# Patient Record
Sex: Male | Born: 1940
Health system: Southern US, Community
[De-identification: ages and names within clinical notes are randomized; demographics above are authoritative.]

## PROBLEM LIST (undated history)

## (undated) DIAGNOSIS — I10 Essential (primary) hypertension: Secondary | ICD-10-CM

## (undated) DIAGNOSIS — C189 Malignant neoplasm of colon, unspecified: Principal | ICD-10-CM

## (undated) DIAGNOSIS — Z923 Personal history of irradiation: Secondary | ICD-10-CM

## (undated) DIAGNOSIS — C3410 Malignant neoplasm of upper lobe, unspecified bronchus or lung: Secondary | ICD-10-CM

## (undated) DIAGNOSIS — Z9221 Personal history of antineoplastic chemotherapy: Secondary | ICD-10-CM

## (undated) HISTORY — DX: Personal history of irradiation: Z92.3

## (undated) HISTORY — PX: OTHER SURGICAL HISTORY: SHX169

## (undated) HISTORY — DX: Malignant neoplasm of upper lobe, unspecified bronchus or lung: C34.10

## (undated) HISTORY — DX: Malignant neoplasm of colon, unspecified: C18.9

## (undated) HISTORY — PX: LUNG SURGERY: SHX703

---

## 2008-08-14 HISTORY — PX: COLON SURGERY: SHX602

## 2008-12-02 ENCOUNTER — Inpatient Hospital Stay (HOSPITAL_COMMUNITY): Admission: EM | Admit: 2008-12-02 | Discharge: 2008-12-14 | Payer: Self-pay | Admitting: Emergency Medicine

## 2008-12-02 IMAGING — CT CT ABDOMEN W/ CM
2 of 5 series · 16 of 46 positions shown, 18 images · IV contrast (agent unspecified)
Comparison: Plain films earlier today

CT ABDOMEN

CLINICAL DATA: Vomiting, dehydration, abdominal pain and swelling

CT ABDOMEN AND PELVIS WITH CONTRAST
TECHNIQUE: Multidetector CT imaging of the abdomen and pelvis was
performed using the standard protocol following bolus
administration of intravenous contrast.
Contrast: No oral contrast.  [BH], 80 mL

[Series 2: abd/pelv with 5.0 b31f st · axial · 0.80mm/px · z∈[-599,-134]mm · 13 of 105 slices shown, 15 images]
[im 6/105  soft-tissue]
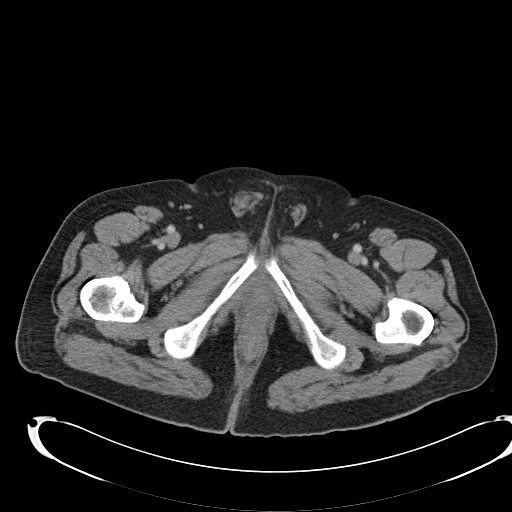
[im 6/105  bone]
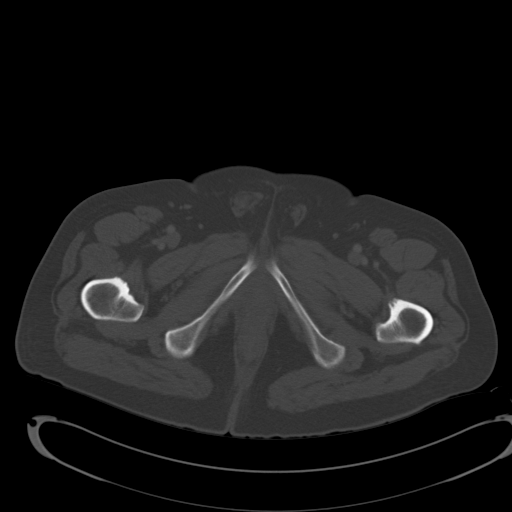
[im 17/105  soft-tissue]
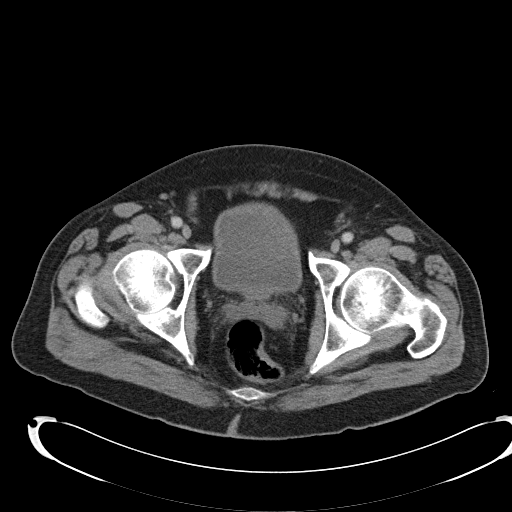
[im 22/105  soft-tissue]
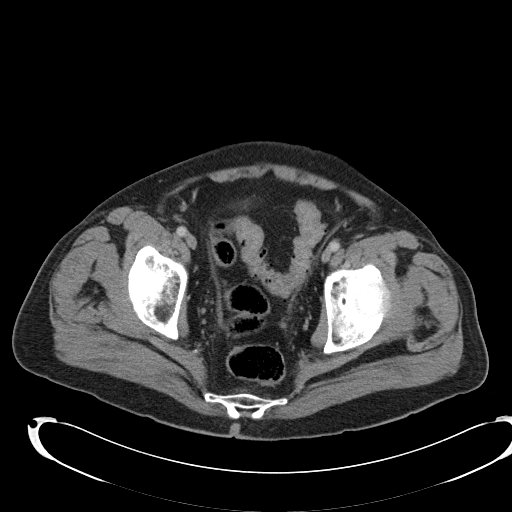
[im 28/105  soft-tissue]
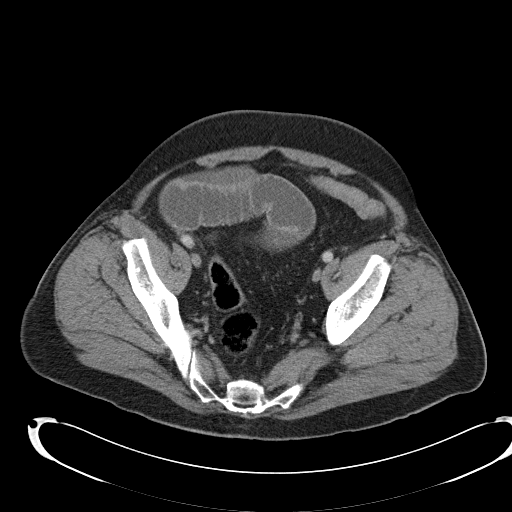
[im 39/105  soft-tissue]
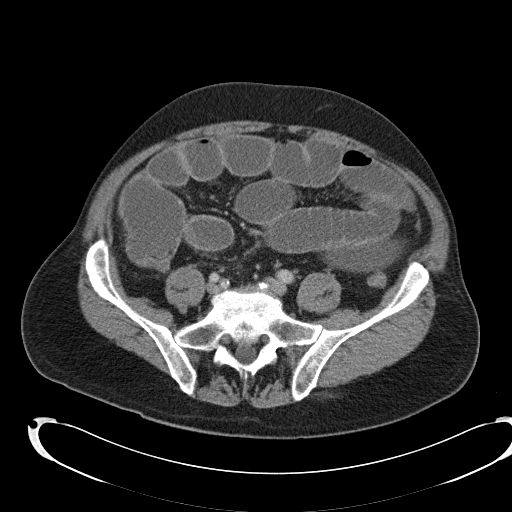
[im 44/105  soft-tissue]
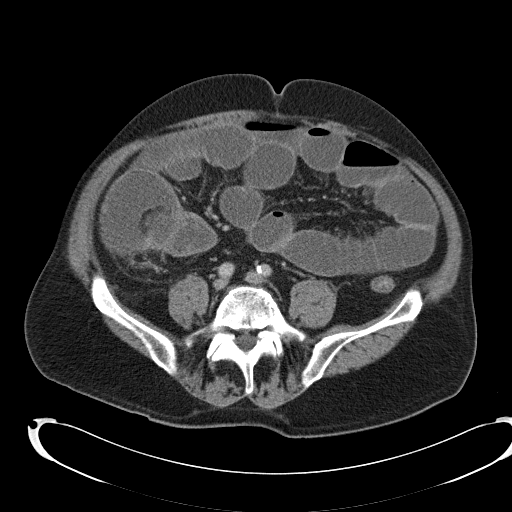
[im 55/105  soft-tissue]
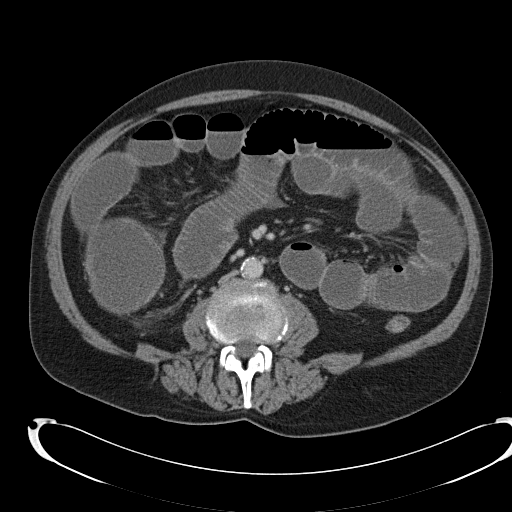
[im 61/105  soft-tissue]
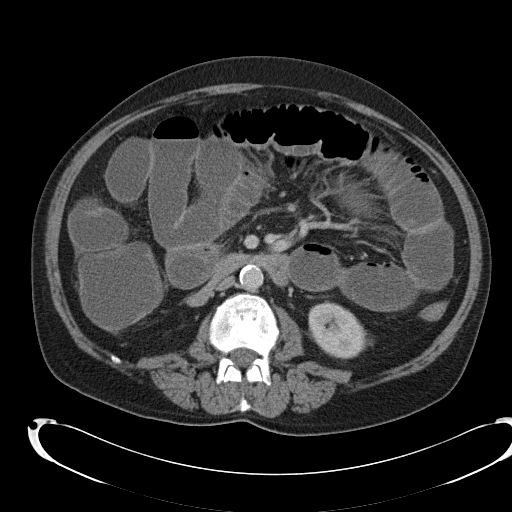
[im 66/105  soft-tissue]
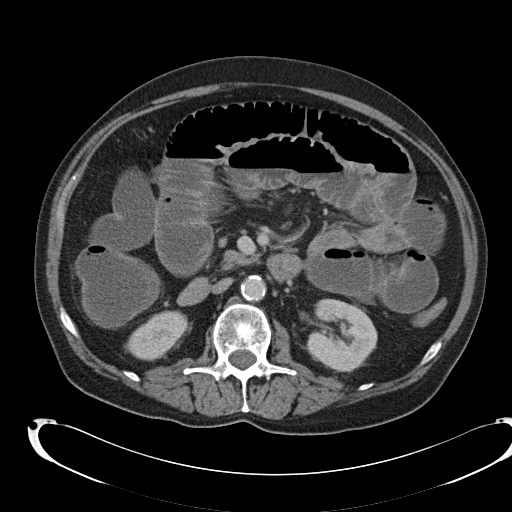
[im 66/105  bone]
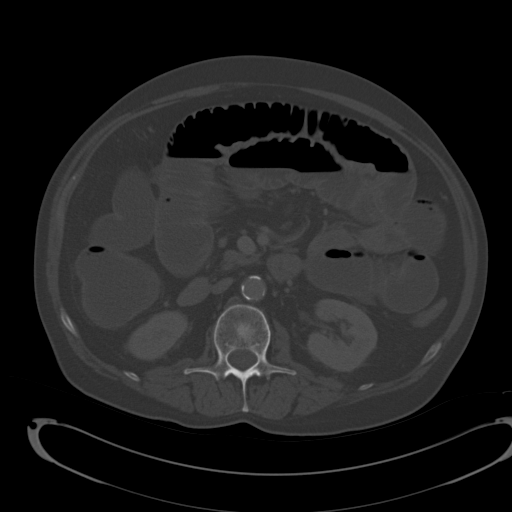
[im 77/105  soft-tissue]
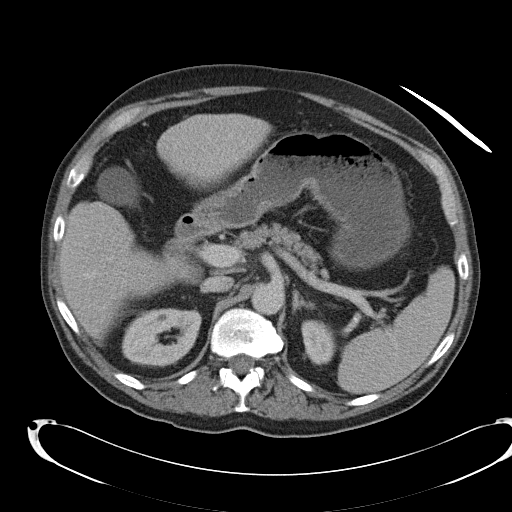
[im 83/105  soft-tissue]
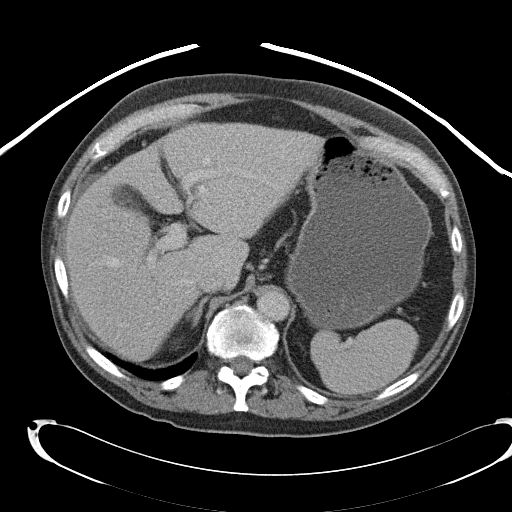
[im 88/105  soft-tissue]
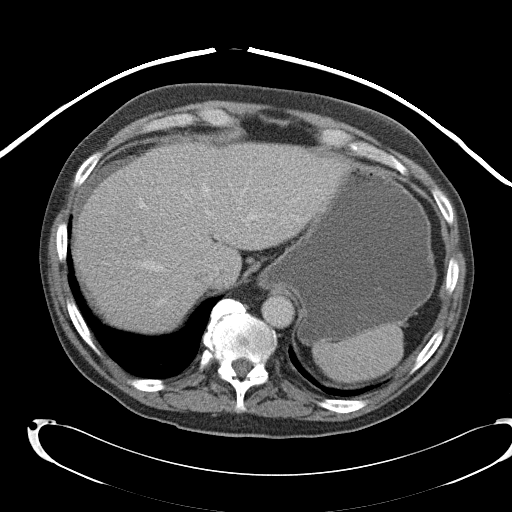
[im 99/105  soft-tissue]
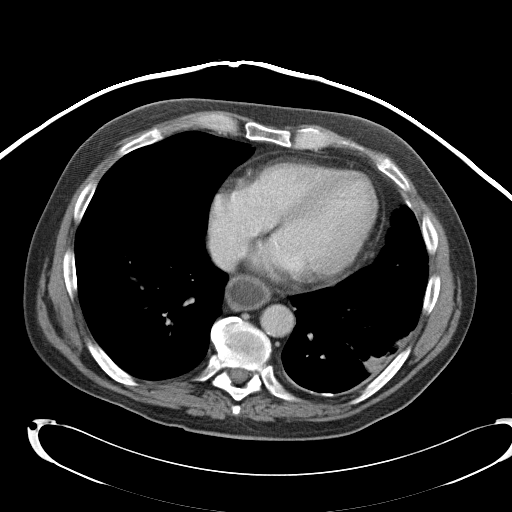

[Series 5: abd/pelv with 3.0 spo cor st · coronal · 1.03mm/px · 3 of 87 slices shown]
[im 29/87  soft-tissue]
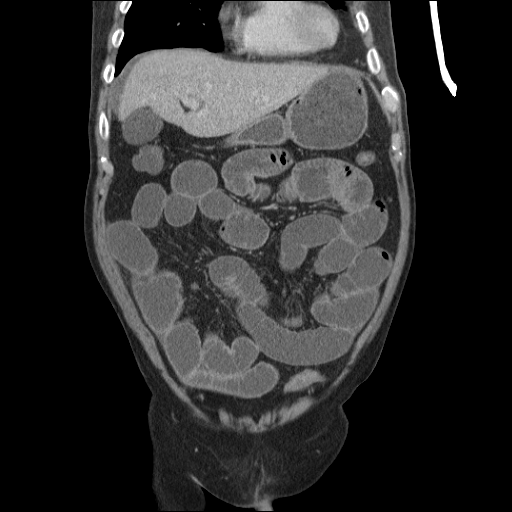
[im 39/87  soft-tissue]
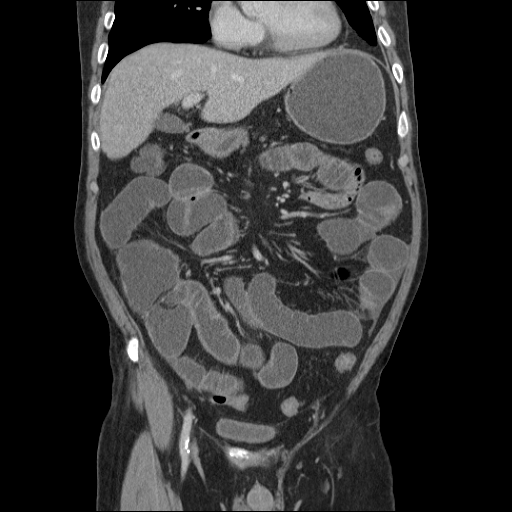
[im 48/87  soft-tissue]
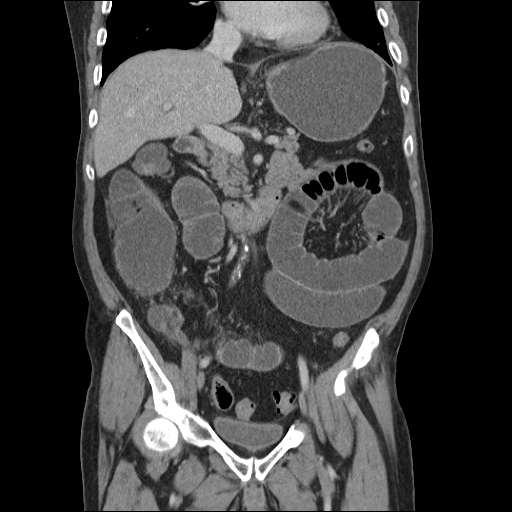

[16 of 46 positions shown; findings below may reference images not displayed]

FINDINGS: Cardiac size within normal limits.  Markedly dilated
esophagus filled with fluid.  Left lower lobe pleural-based mass,
with the largest component measuring 23 x 14 mm on image 7.
Recommend formal CT chest to further evaluate.

Marked dilatation of stomach, as well as the entire small bowel
including jejunum and ileum. All are filled with fluid. Liver
normal in size without focal lesions or biliary ductal dilatation.
Gallbladder normal.  Small amount of abnormal fluid lies alongside
the right lobe of the liver extending into the right subphrenic
space.  Spleen, pancreas, adrenal glands, and kidneys all normal.
Nonaneurysmal atherosclerotic calcification of the aorta.

At the level of the hepatic flexure, there is an "apple core lesion
"extending over a length of approximate 4 cm.  This is causing
proximal colonic and small bowel obstruction.  The colon is also
fluid-filled.  There is no free air.  The transverse colon is
decompressed but filled with fluid.  There are some small lymph
nodes in the adjacent mesentery, none of which measure greater than
1 cm in short axis dimension.  Please note that a small or normal-
sized lymph nodes could harbor regional disease.
IMPRESSION: High-grade entire small and proximal large bowel obstruction
secondary to an obstructing lesion, likely adenocarcinoma, of the
right colon   hepatic flexure.

Possible left lower lobe metastasis.

No visible liver metastases.

Free intra-abdominal fluid and stranding of the mesentery; no
visible pneumoperitoneum however.

CT PELVIS
FINDINGS: Dilated small bowel extends into the pelvis.  The
descending colon, sigmoid colon, and rectum are decompressed and/or
filled with air.  Scattered diverticuli are observed.  There is no
free fluid in the cul-de-sac.  No significant adenopathy is
present.  Extensive Schmorl's nodes are seen in the lumbar spine.
There is no visible osseous metastatic disease.  Bladder is
unremarkable.  There is no obstructive uropathy.
IMPRESSION: High-grade small bowel obstruction as described.

Critical test results telephoned to [REDACTED]/NP at the time of
interpretation.

## 2008-12-02 IMAGING — CR DG ABDOMEN ACUTE W/ 1V CHEST
3 series · 3 of 3 positions shown · non-contrast
Comparison: No priors

CLINICAL DATA: Lower abdominal pain with vomiting

ACUTE ABDOMEN SERIES (ABDOMEN 2 VIEW & CHEST 1 VIEW)

[w chest pa]
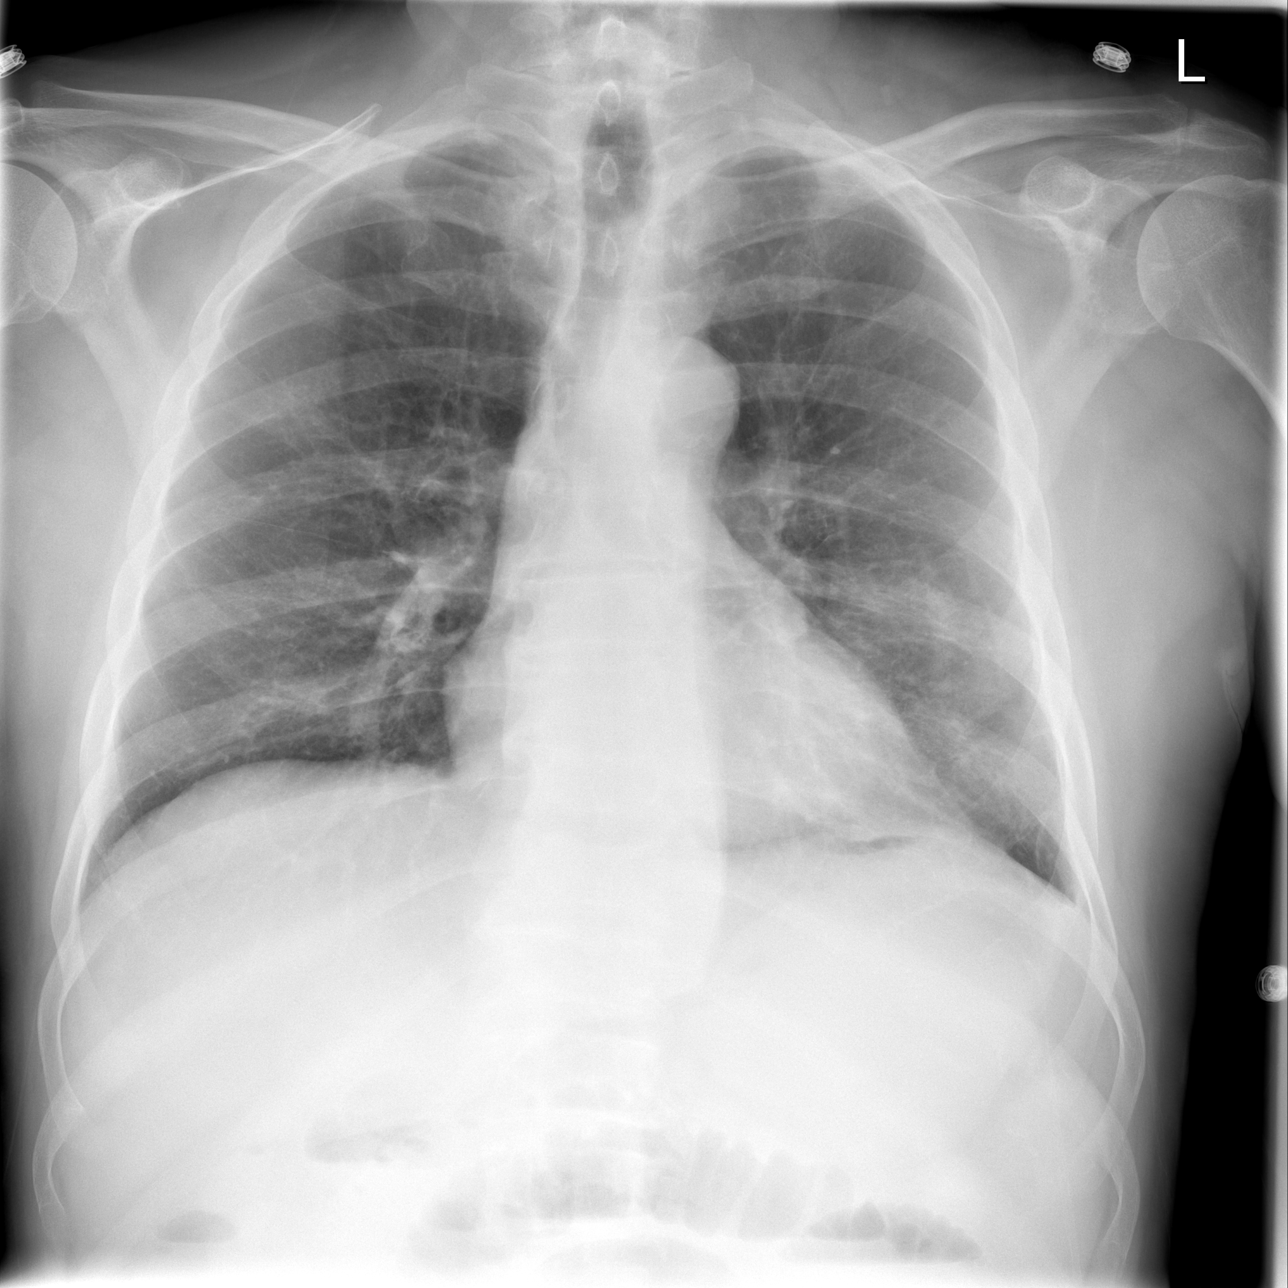

[w abdomen upright]
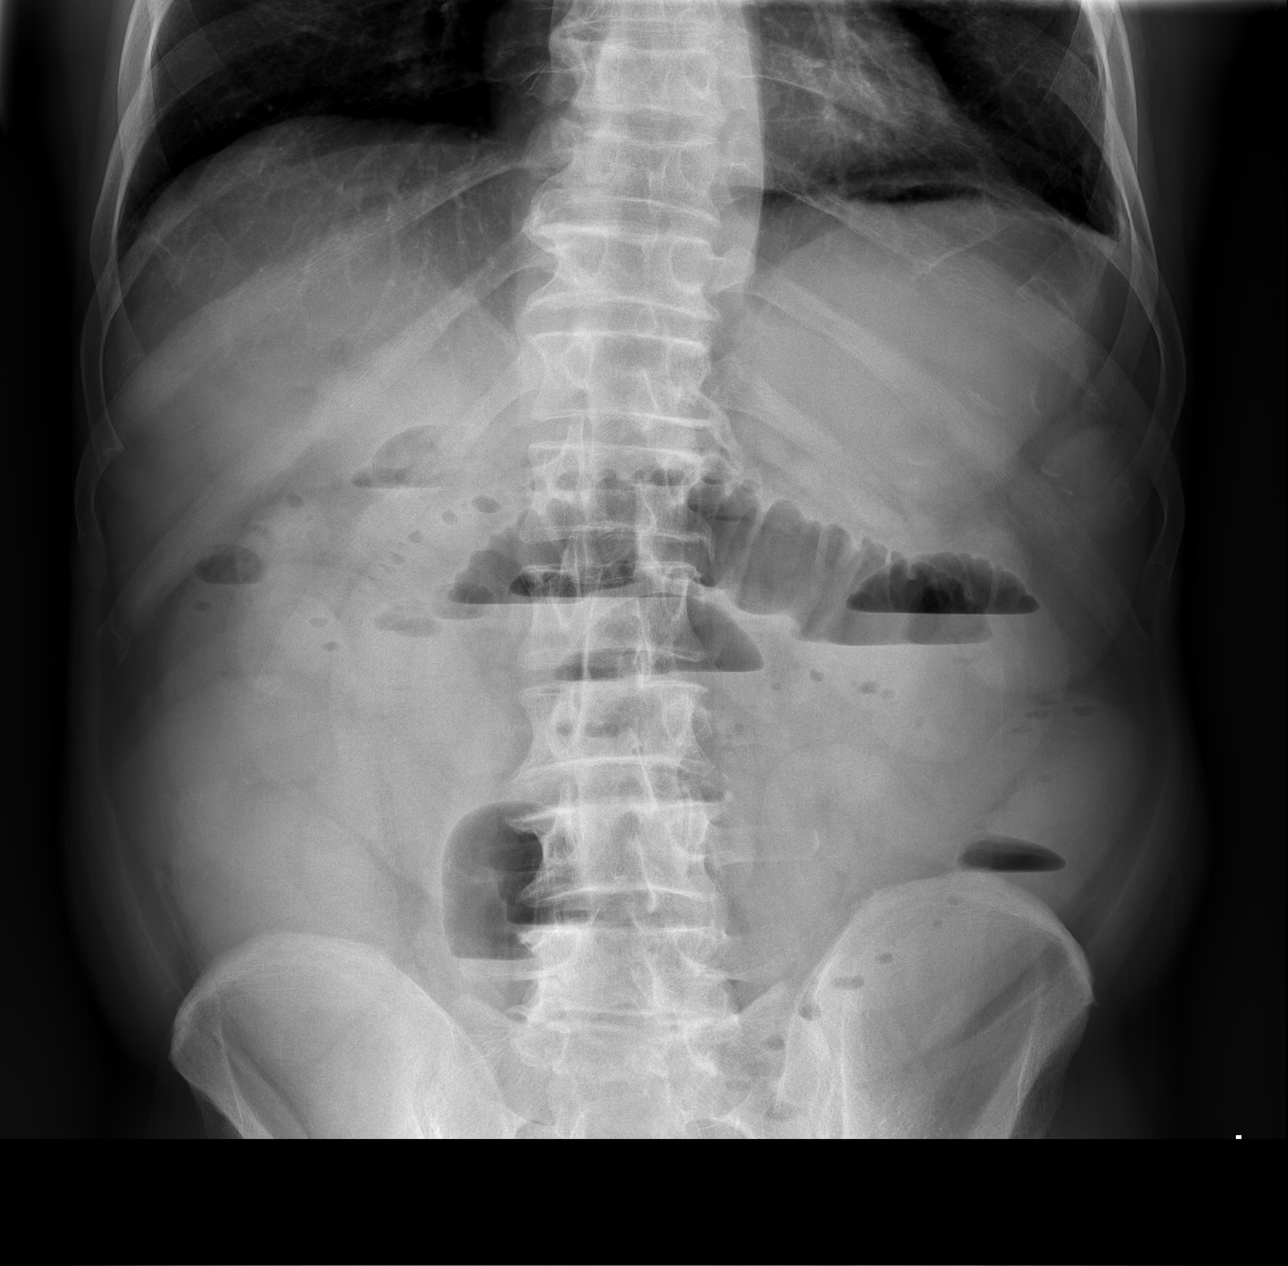

[t abdomen supine]
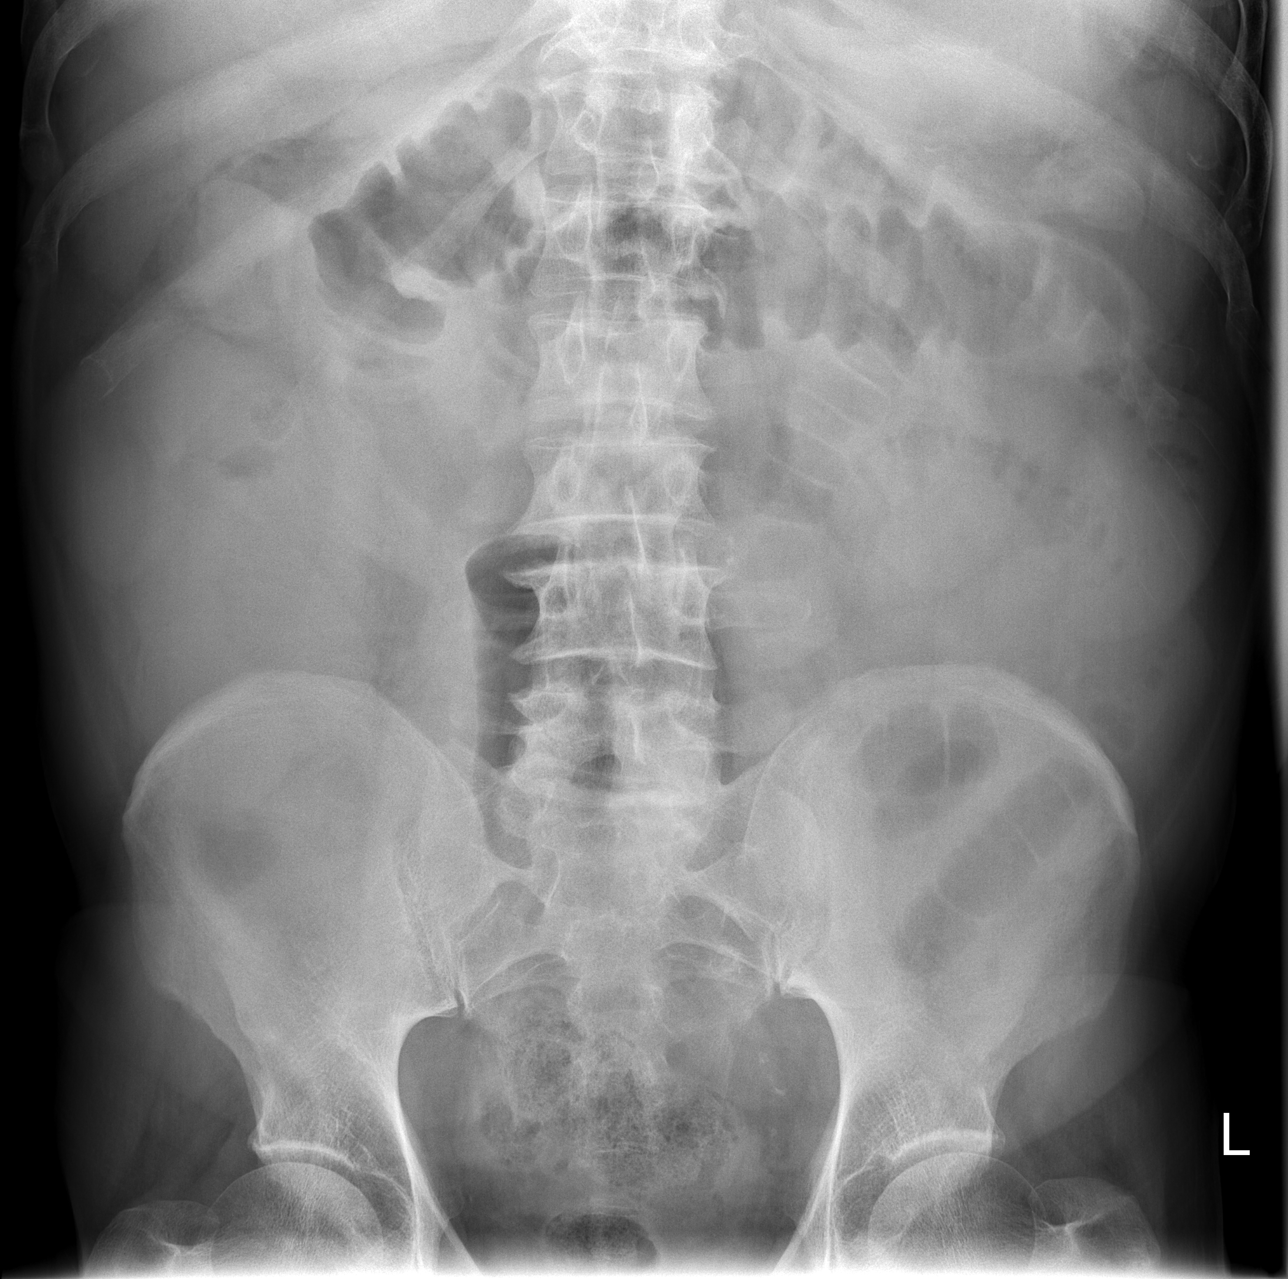

[3 of 3 positions shown; findings below may reference images not displayed]

FINDINGS: Heart and mediastinal contours normal.  Peribronchial
thickening and prominent basilar markings without definite
pneumonia or significant pleural fluid in one-view.

No free air.  Abnormally dilated small bowel loops in the mid
abdomen with significant air fluid levels.  The findings are
consistent with a partial small bowel obstruction.  There is little
stool or gas in the colon.
IMPRESSION: 1.  Chronic lung changes as above - no acute findings in one-view.
2.  Bowel gas pattern most consistent with a partial small bowel
obstruction.

## 2008-12-03 IMAGING — CT CT CHEST W/ CM
3 series · 17 of 30 positions shown, 19 images · IV contrast (agent unspecified)
Comparison: Abdomen CT on [DATE]

CLINICAL DATA: Newly diagnosed colon carcinoma.  Left lower lung
opacities seen on the recent abdomen CT.  Staging.

CT CHEST WITH CONTRAST
TECHNIQUE: Multidetector CT imaging of the chest was performed
following the standard protocol during bolus administration of
intravenous contrast.
Contrast: 80 ml [W7]

[Series 2: routine chest · axial · 0.80mm/px · z∈[-214,-54]mm · 6 of 50 slices shown, 8 images]
[im 9/50  mediastinal]
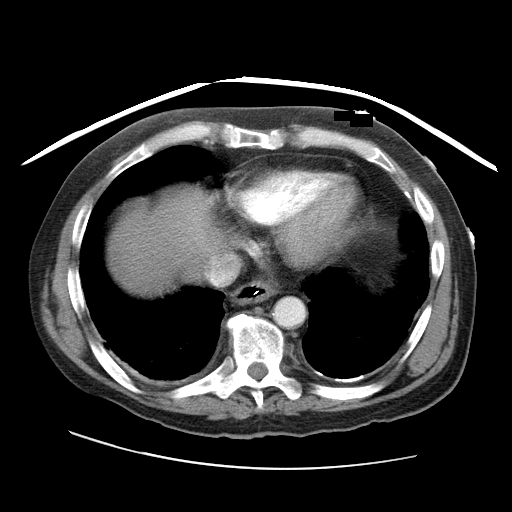
[im 9/50  lung]
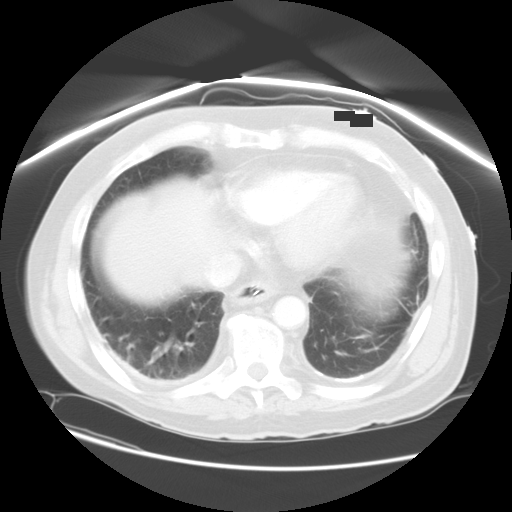
[im 17/50  lung]
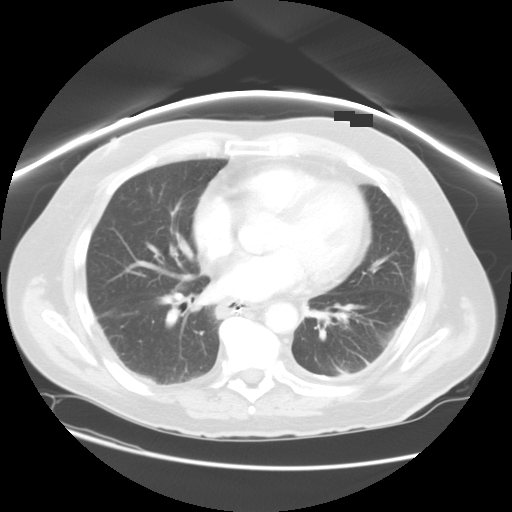
[im 25/50  lung]
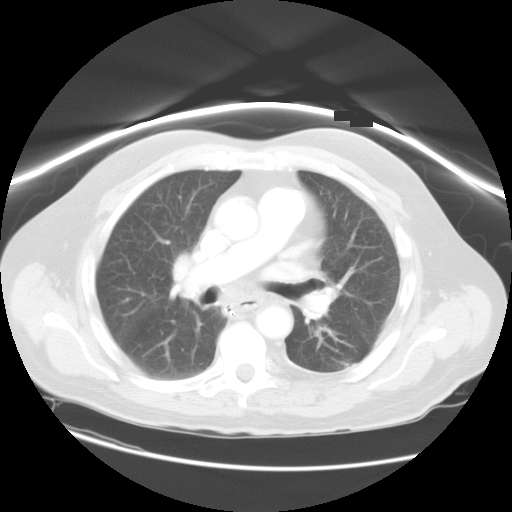
[im 32/50  lung]
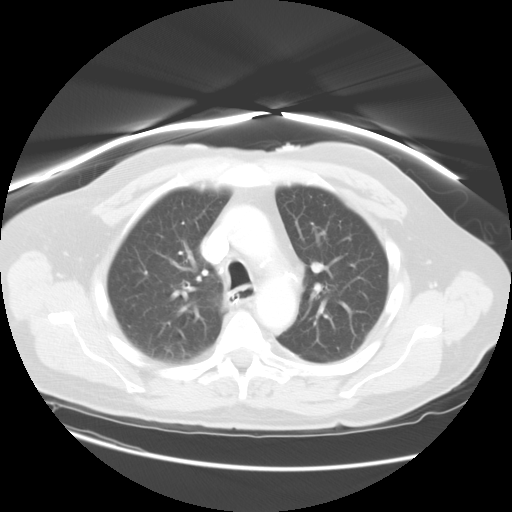
[im 33/50  mediastinal]
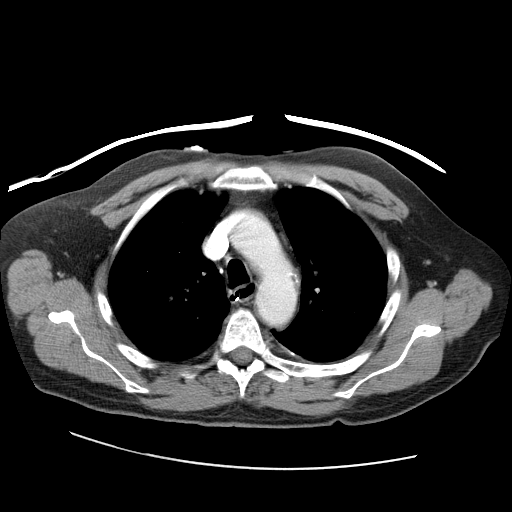
[im 33/50  lung]
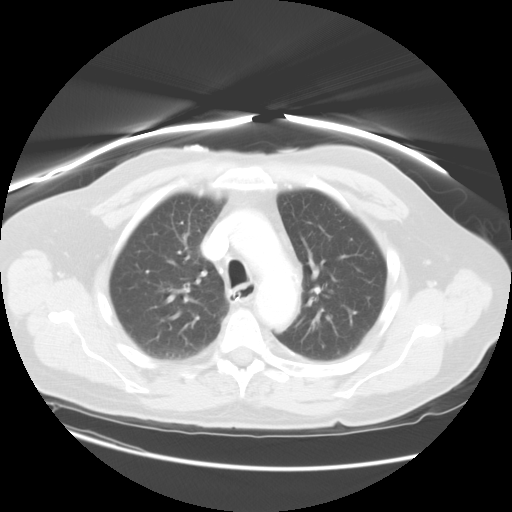
[im 41/50  lung]
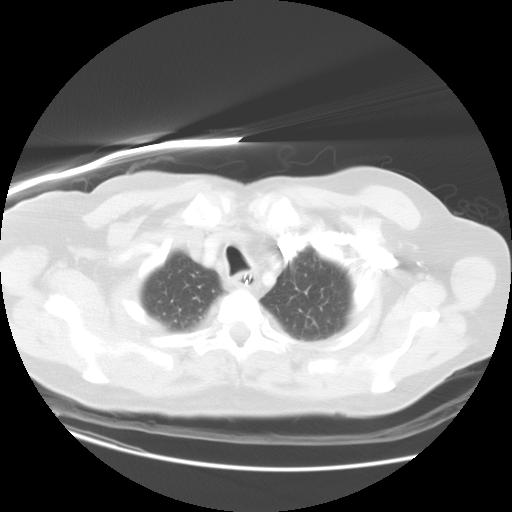

[Series 400: reformatted · sagittal · 0.80mm/px · 8 of 84 slices shown (1 of 2)]
[im 8/84  lung]
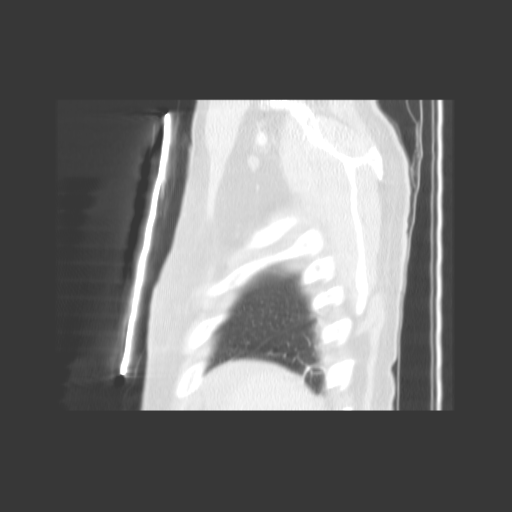
[im 23/84  lung]
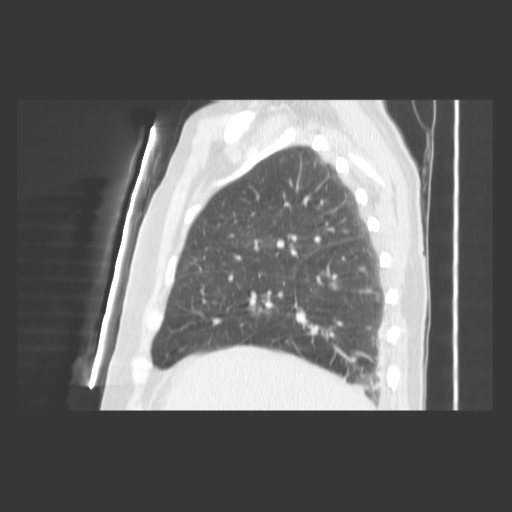
[im 31/84  lung]
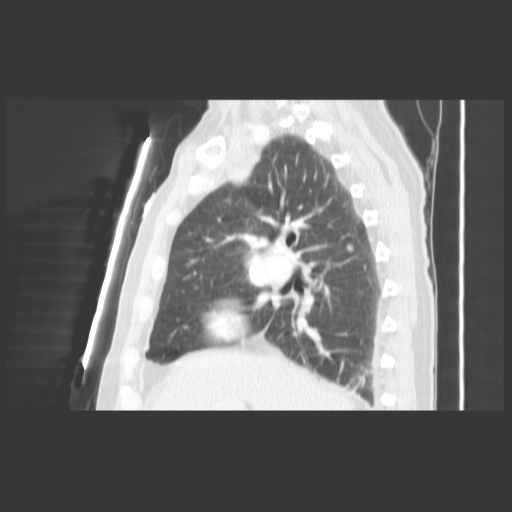
[im 38/84  lung]
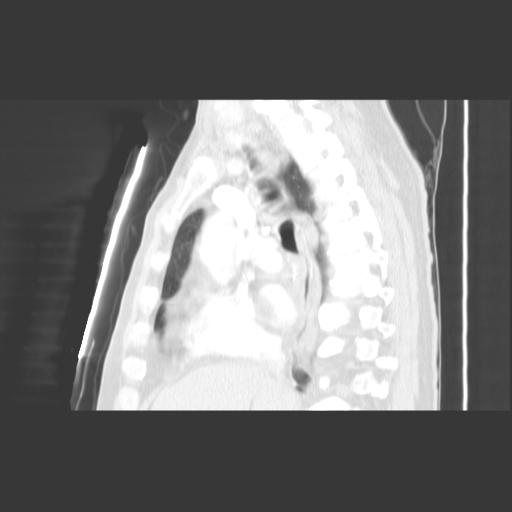
[im 46/84  lung]
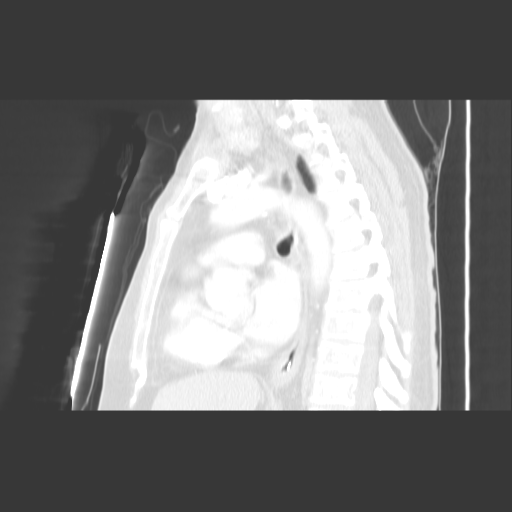
[im 53/84  lung]
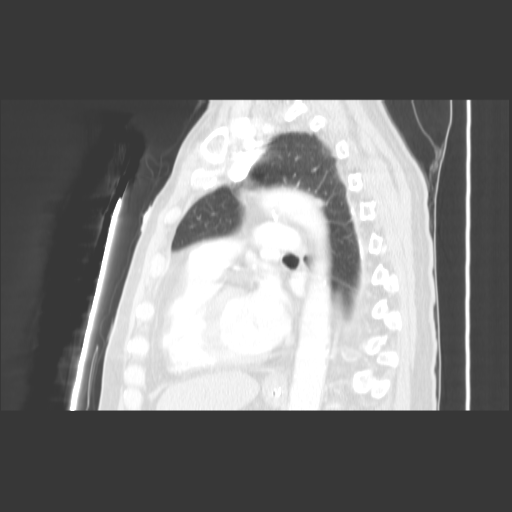
[im 61/84  lung]
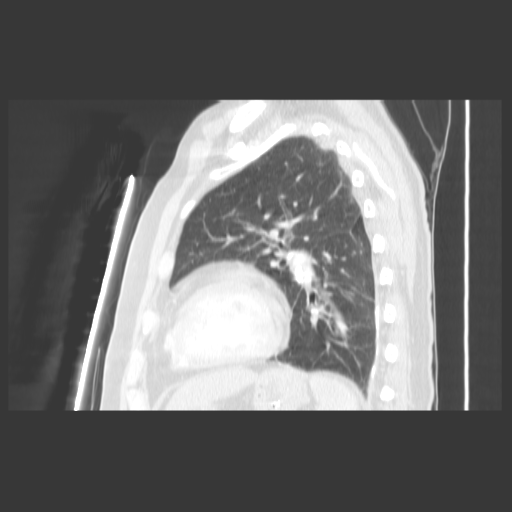
[im 76/84  lung]
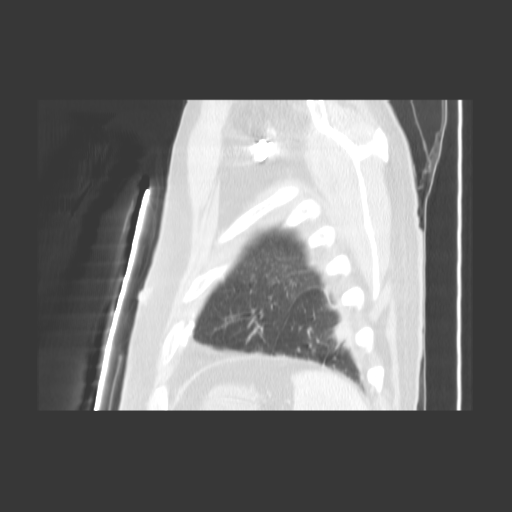

[Series 401: reformatted · coronal · 0.80mm/px · 3 of 65 slices shown (2 of 2)]
[im 9/65  lung]
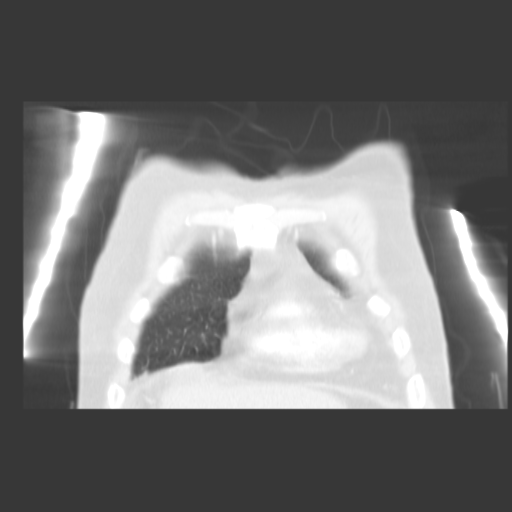
[im 17/65  lung]
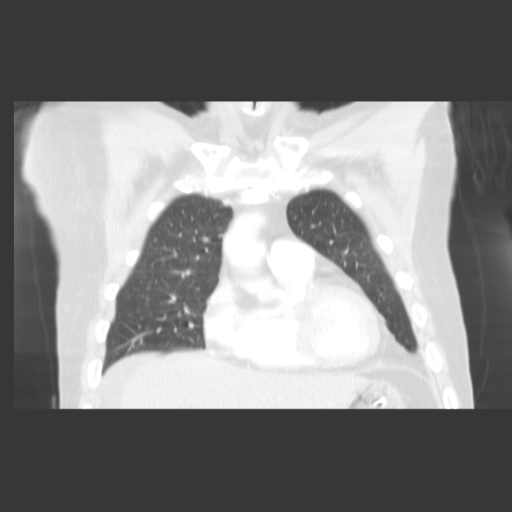
[im 25/65  lung]
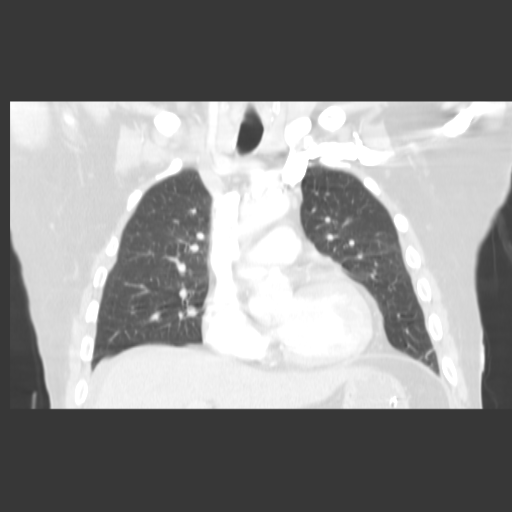

[17 of 30 positions shown; findings below may reference images not displayed]

FINDINGS: Pleural - parenchymal opacity in the lateral aspect the
left lower lobe has the appearance of scarring.  Associated pleural
thickening calcification also noted.  Mild scarring is also seen in
the right lung base.

 A 6 mm noncalcified nodule is seen in the superior segment of the
right lower lobe which is indeterminate.  No other pulmonary
nodules or masses are identified.  There is no evidence of
pulmonary air space disease.  No evidence of pleural effusion.
Calcified hilar mediastinal lymph nodes are seen consistent with
old granulomatous disease.  There is no evidence of
lymphadenopathy.
IMPRESSION: 1.  No definite evidence of metastatic disease within the thorax.
2.  6 mm indeterminate nodule in the superior segment of the right
lower lobe.  Follow-up by CT is recommended in 4 months to assess
the stability.
3.  Pleural - parenchymal scarring in both lung bases, left side
greater than right.  This accounts for the opacity seen on recent
abdomen CT.

## 2008-12-04 ENCOUNTER — Encounter (INDEPENDENT_AMBULATORY_CARE_PROVIDER_SITE_OTHER): Payer: Self-pay | Admitting: Gastroenterology

## 2008-12-05 ENCOUNTER — Encounter (INDEPENDENT_AMBULATORY_CARE_PROVIDER_SITE_OTHER): Payer: Self-pay | Admitting: General Surgery

## 2008-12-31 ENCOUNTER — Ambulatory Visit: Payer: Self-pay | Admitting: Hematology & Oncology

## 2009-01-12 LAB — CBC WITH DIFFERENTIAL (CANCER CENTER ONLY)
BASO#: 0 10*3/uL (ref 0.0–0.2)
BASO%: 0.4 % (ref 0.0–2.0)
EOS%: 2.7 % (ref 0.0–7.0)
Eosinophils Absolute: 0.2 10*3/uL (ref 0.0–0.5)
HCT: 42.3 % (ref 38.7–49.9)
HGB: 13.9 g/dL (ref 13.0–17.1)
LYMPH#: 1.6 10*3/uL (ref 0.9–3.3)
LYMPH%: 27.1 % (ref 14.0–48.0)
MCH: 31.7 pg (ref 28.0–33.4)
MCHC: 32.9 g/dL (ref 32.0–35.9)
MCV: 96 fL (ref 82–98)
MONO#: 0.6 10*3/uL (ref 0.1–0.9)
MONO%: 9.5 % (ref 0.0–13.0)
NEUT#: 3.6 10*3/uL (ref 1.5–6.5)
NEUT%: 60.3 % (ref 40.0–80.0)
Platelets: 181 10*3/uL (ref 145–400)
RBC: 4.4 10*6/uL (ref 4.20–5.70)
RDW: 11.6 % (ref 10.5–14.6)
WBC: 5.9 10*3/uL (ref 4.0–10.0)

## 2009-01-12 LAB — COMPREHENSIVE METABOLIC PANEL
ALT: 12 U/L (ref 0–53)
AST: 14 U/L (ref 0–37)
Albumin: 3.8 g/dL (ref 3.5–5.2)
Alkaline Phosphatase: 101 U/L (ref 39–117)
BUN: 8 mg/dL (ref 6–23)
CO2: 26 mEq/L (ref 19–32)
Calcium: 8.8 mg/dL (ref 8.4–10.5)
Chloride: 104 mEq/L (ref 96–112)
Creatinine, Ser: 0.81 mg/dL (ref 0.40–1.50)
Glucose, Bld: 116 mg/dL — ABNORMAL HIGH (ref 70–99)
Potassium: 4 mEq/L (ref 3.5–5.3)
Sodium: 140 mEq/L (ref 135–145)
Total Bilirubin: 0.7 mg/dL (ref 0.3–1.2)
Total Protein: 6.7 g/dL (ref 6.0–8.3)

## 2009-01-12 LAB — CEA: CEA: 0.6 ng/mL (ref 0.0–5.0)

## 2009-02-09 ENCOUNTER — Ambulatory Visit: Payer: Self-pay | Admitting: Hematology & Oncology

## 2009-02-18 ENCOUNTER — Ambulatory Visit (HOSPITAL_COMMUNITY): Admission: RE | Admit: 2009-02-18 | Discharge: 2009-02-18 | Payer: Self-pay | Admitting: General Surgery

## 2009-02-18 IMAGING — CR DG CHEST 1V
1 series · 1 of 1 positions shown · non-contrast
Comparison: None

CLINICAL DATA: Port-A-Cath placement.

CHEST - 1 VIEW

[AP]
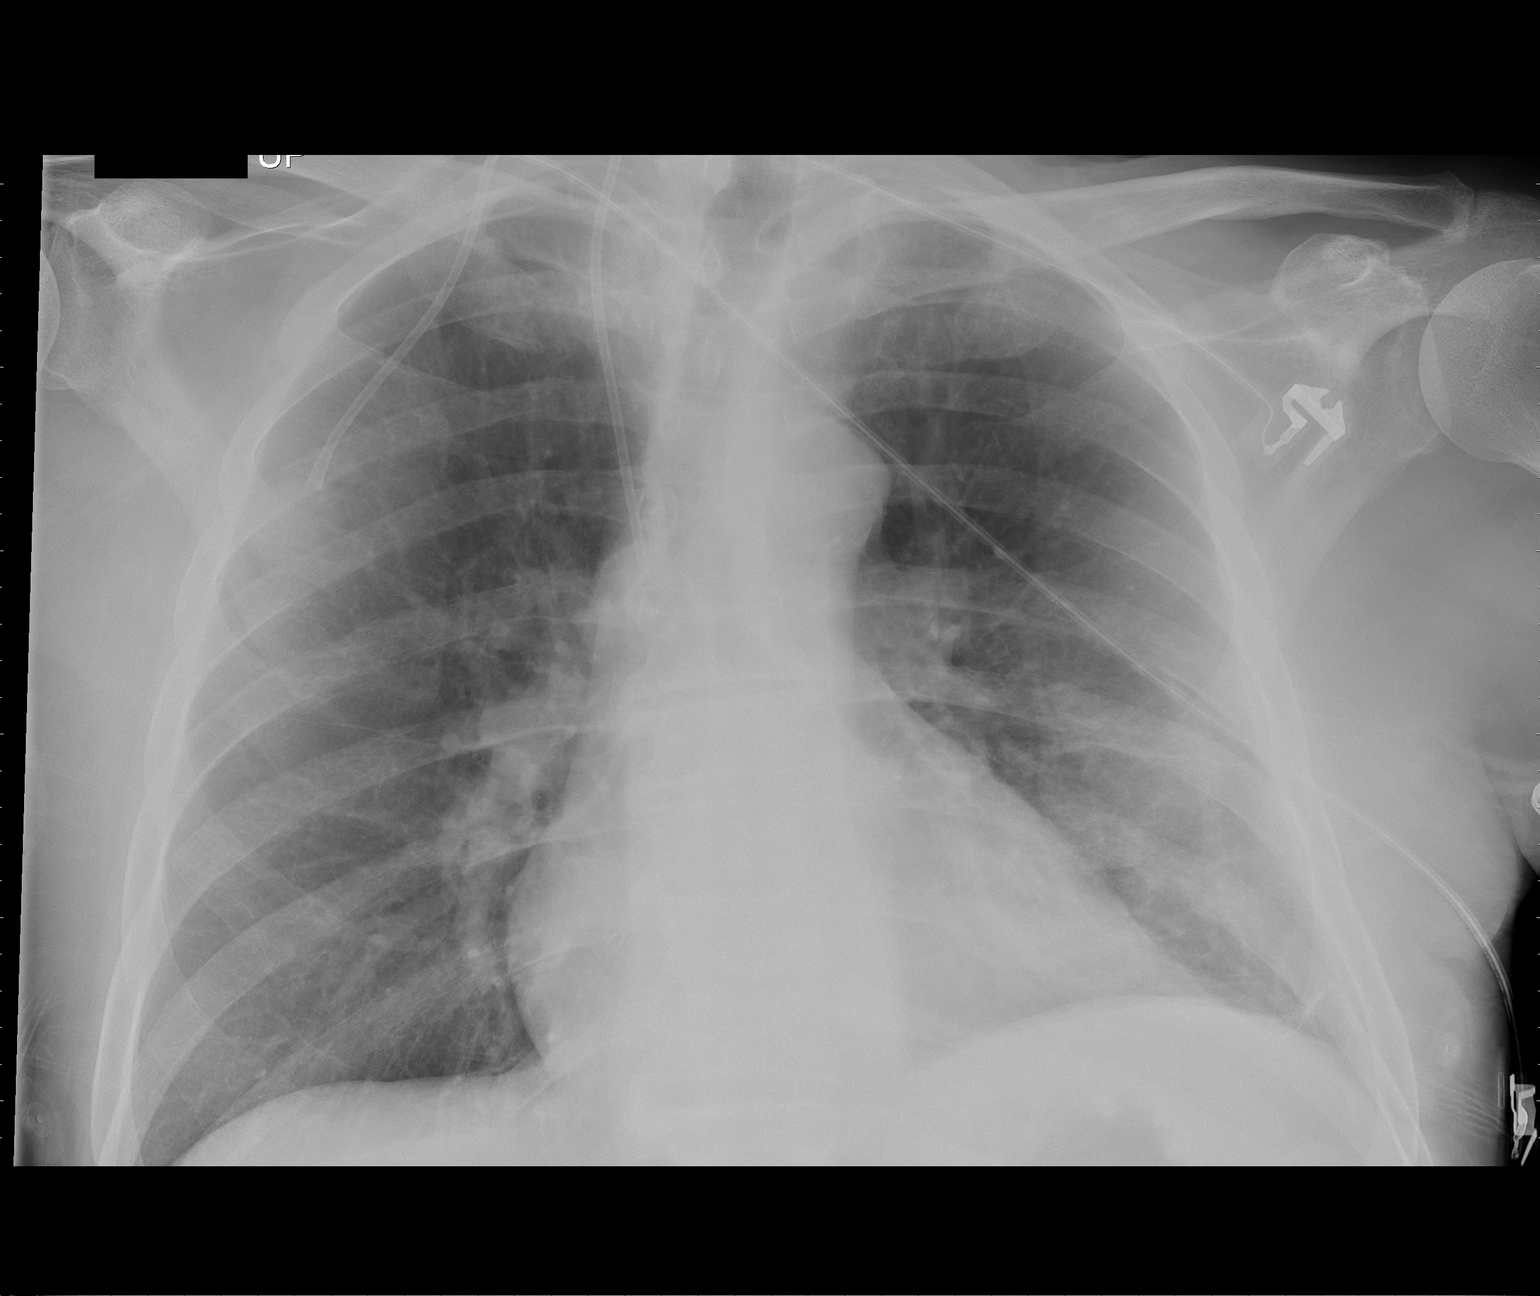

[1 of 1 positions shown; findings below may reference images not displayed]

FINDINGS: Right Port-A-Cath is in place with the tip in the SVC.
No pneumothorax.  Patchy left basilar airspace disease, could
represent atelectasis or scarring.  Heart is upper limits normal in
size.  Right lung clear.  No effusions.  No acute bony abnormality.
IMPRESSION: Right Port-A-Cath tip mid SVC.  No pneumothorax.

## 2009-03-05 LAB — CBC WITH DIFFERENTIAL (CANCER CENTER ONLY)
BASO#: 0 10*3/uL (ref 0.0–0.2)
BASO%: 0.6 % (ref 0.0–2.0)
EOS%: 4 % (ref 0.0–7.0)
Eosinophils Absolute: 0.2 10*3/uL (ref 0.0–0.5)
HCT: 41.3 % (ref 38.7–49.9)
HGB: 14 g/dL (ref 13.0–17.1)
LYMPH#: 1.4 10*3/uL (ref 0.9–3.3)
LYMPH%: 34.9 % (ref 14.0–48.0)
MCH: 31.6 pg (ref 28.0–33.4)
MCHC: 33.9 g/dL (ref 32.0–35.9)
MCV: 93 fL (ref 82–98)
MONO#: 0.3 10*3/uL (ref 0.1–0.9)
MONO%: 7.4 % (ref 0.0–13.0)
NEUT#: 2.1 10*3/uL (ref 1.5–6.5)
NEUT%: 53.1 % (ref 40.0–80.0)
Platelets: 111 10*3/uL — ABNORMAL LOW (ref 145–400)
RBC: 4.43 10*6/uL (ref 4.20–5.70)
RDW: 12.9 % (ref 10.5–14.6)
WBC: 4 10*3/uL (ref 4.0–10.0)

## 2009-03-05 LAB — COMPREHENSIVE METABOLIC PANEL
ALT: 12 U/L (ref 0–53)
AST: 17 U/L (ref 0–37)
Albumin: 3.7 g/dL (ref 3.5–5.2)
Alkaline Phosphatase: 118 U/L — ABNORMAL HIGH (ref 39–117)
BUN: 8 mg/dL (ref 6–23)
CO2: 28 mEq/L (ref 19–32)
Calcium: 9.1 mg/dL (ref 8.4–10.5)
Chloride: 106 mEq/L (ref 96–112)
Creatinine, Ser: 0.74 mg/dL (ref 0.40–1.50)
Glucose, Bld: 114 mg/dL — ABNORMAL HIGH (ref 70–99)
Potassium: 4.1 mEq/L (ref 3.5–5.3)
Sodium: 143 mEq/L (ref 135–145)
Total Bilirubin: 0.6 mg/dL (ref 0.3–1.2)
Total Protein: 6.6 g/dL (ref 6.0–8.3)

## 2009-03-18 ENCOUNTER — Ambulatory Visit: Payer: Self-pay | Admitting: Hematology & Oncology

## 2009-03-22 LAB — CBC WITH DIFFERENTIAL (CANCER CENTER ONLY)
BASO#: 0.1 10*3/uL (ref 0.0–0.2)
BASO%: 1.4 % (ref 0.0–2.0)
EOS%: 3.6 % (ref 0.0–7.0)
Eosinophils Absolute: 0.2 10*3/uL (ref 0.0–0.5)
HCT: 40.9 % (ref 38.7–49.9)
HGB: 13.8 g/dL (ref 13.0–17.1)
LYMPH#: 1.7 10*3/uL (ref 0.9–3.3)
LYMPH%: 38.8 % (ref 14.0–48.0)
MCH: 31.3 pg (ref 28.0–33.4)
MCHC: 33.8 g/dL (ref 32.0–35.9)
MCV: 93 fL (ref 82–98)
MONO#: 0.4 10*3/uL (ref 0.1–0.9)
MONO%: 9 % (ref 0.0–13.0)
NEUT#: 2 10*3/uL (ref 1.5–6.5)
NEUT%: 47.2 % (ref 40.0–80.0)
Platelets: 89 10*3/uL — ABNORMAL LOW (ref 145–400)
RBC: 4.41 10*6/uL (ref 4.20–5.70)
RDW: 13.7 % (ref 10.5–14.6)
WBC: 4.3 10*3/uL (ref 4.0–10.0)

## 2009-03-22 LAB — COMPREHENSIVE METABOLIC PANEL
ALT: 13 U/L (ref 0–53)
AST: 17 U/L (ref 0–37)
Albumin: 3.6 g/dL (ref 3.5–5.2)
Alkaline Phosphatase: 131 U/L — ABNORMAL HIGH (ref 39–117)
BUN: 8 mg/dL (ref 6–23)
CO2: 25 mEq/L (ref 19–32)
Calcium: 8.7 mg/dL (ref 8.4–10.5)
Chloride: 110 mEq/L (ref 96–112)
Creatinine, Ser: 0.59 mg/dL (ref 0.40–1.50)
Glucose, Bld: 102 mg/dL — ABNORMAL HIGH (ref 70–99)
Potassium: 3.7 mEq/L (ref 3.5–5.3)
Sodium: 143 mEq/L (ref 135–145)
Total Bilirubin: 0.5 mg/dL (ref 0.3–1.2)
Total Protein: 6.4 g/dL (ref 6.0–8.3)

## 2009-03-22 LAB — CEA: CEA: 1.1 ng/mL (ref 0.0–5.0)

## 2009-04-12 LAB — CBC WITH DIFFERENTIAL (CANCER CENTER ONLY)
BASO#: 0.1 10*3/uL (ref 0.0–0.2)
BASO%: 1.5 % (ref 0.0–2.0)
EOS%: 3 % (ref 0.0–7.0)
Eosinophils Absolute: 0.1 10*3/uL (ref 0.0–0.5)
HCT: 41.9 % (ref 38.7–49.9)
HGB: 14.3 g/dL (ref 13.0–17.1)
LYMPH#: 2 10*3/uL (ref 0.9–3.3)
LYMPH%: 50.3 % — ABNORMAL HIGH (ref 14.0–48.0)
MCH: 31.9 pg (ref 28.0–33.4)
MCHC: 34.1 g/dL (ref 32.0–35.9)
MCV: 94 fL (ref 82–98)
MONO#: 0.5 10*3/uL (ref 0.1–0.9)
MONO%: 12.9 % (ref 0.0–13.0)
NEUT#: 1.3 10*3/uL — ABNORMAL LOW (ref 1.5–6.5)
NEUT%: 32.3 % — ABNORMAL LOW (ref 40.0–80.0)
Platelets: 89 10*3/uL — ABNORMAL LOW (ref 145–400)
RBC: 4.48 10*6/uL (ref 4.20–5.70)
RDW: 14.6 % (ref 10.5–14.6)
WBC: 3.9 10*3/uL — ABNORMAL LOW (ref 4.0–10.0)

## 2009-04-23 ENCOUNTER — Ambulatory Visit: Payer: Self-pay | Admitting: Hematology & Oncology

## 2009-04-26 LAB — COMPREHENSIVE METABOLIC PANEL
ALT: 13 U/L (ref 0–53)
AST: 21 U/L (ref 0–37)
Albumin: 3.6 g/dL (ref 3.5–5.2)
Alkaline Phosphatase: 154 U/L — ABNORMAL HIGH (ref 39–117)
BUN: 7 mg/dL (ref 6–23)
CO2: 24 mEq/L (ref 19–32)
Calcium: 8.8 mg/dL (ref 8.4–10.5)
Chloride: 112 mEq/L (ref 96–112)
Creatinine, Ser: 0.55 mg/dL (ref 0.40–1.50)
Glucose, Bld: 114 mg/dL — ABNORMAL HIGH (ref 70–99)
Potassium: 3.6 mEq/L (ref 3.5–5.3)
Sodium: 144 mEq/L (ref 135–145)
Total Bilirubin: 0.6 mg/dL (ref 0.3–1.2)
Total Protein: 6.5 g/dL (ref 6.0–8.3)

## 2009-04-26 LAB — CBC WITH DIFFERENTIAL (CANCER CENTER ONLY)
BASO#: 0 10*3/uL (ref 0.0–0.2)
BASO%: 0.5 % (ref 0.0–2.0)
EOS%: 3.2 % (ref 0.0–7.0)
Eosinophils Absolute: 0.1 10*3/uL (ref 0.0–0.5)
HCT: 39.8 % (ref 38.7–49.9)
HGB: 13.7 g/dL (ref 13.0–17.1)
LYMPH#: 1.4 10*3/uL (ref 0.9–3.3)
LYMPH%: 37.1 % (ref 14.0–48.0)
MCH: 32.5 pg (ref 28.0–33.4)
MCHC: 34.5 g/dL (ref 32.0–35.9)
MCV: 94 fL (ref 82–98)
MONO#: 0.3 10*3/uL (ref 0.1–0.9)
MONO%: 8.9 % (ref 0.0–13.0)
NEUT#: 1.8 10*3/uL (ref 1.5–6.5)
NEUT%: 50.3 % (ref 40.0–80.0)
Platelets: 63 10*3/uL — ABNORMAL LOW (ref 145–400)
RBC: 4.23 10*6/uL (ref 4.20–5.70)
RDW: 15 % — ABNORMAL HIGH (ref 10.5–14.6)
WBC: 3.7 10*3/uL — ABNORMAL LOW (ref 4.0–10.0)

## 2009-05-10 LAB — COMPREHENSIVE METABOLIC PANEL
ALT: 12 U/L (ref 0–53)
AST: 18 U/L (ref 0–37)
Albumin: 3.7 g/dL (ref 3.5–5.2)
Alkaline Phosphatase: 132 U/L — ABNORMAL HIGH (ref 39–117)
BUN: 9 mg/dL (ref 6–23)
CO2: 25 mEq/L (ref 19–32)
Calcium: 8.9 mg/dL (ref 8.4–10.5)
Chloride: 109 mEq/L (ref 96–112)
Creatinine, Ser: 0.59 mg/dL (ref 0.40–1.50)
Glucose, Bld: 95 mg/dL (ref 70–99)
Potassium: 3.8 mEq/L (ref 3.5–5.3)
Sodium: 143 mEq/L (ref 135–145)
Total Bilirubin: 0.5 mg/dL (ref 0.3–1.2)
Total Protein: 6.3 g/dL (ref 6.0–8.3)

## 2009-05-10 LAB — CBC WITH DIFFERENTIAL (CANCER CENTER ONLY)
BASO#: 0 10*3/uL (ref 0.0–0.2)
BASO%: 0.7 % (ref 0.0–2.0)
EOS%: 2.7 % (ref 0.0–7.0)
Eosinophils Absolute: 0.1 10*3/uL (ref 0.0–0.5)
HCT: 40.5 % (ref 38.7–49.9)
HGB: 13.8 g/dL (ref 13.0–17.1)
LYMPH#: 1.5 10*3/uL (ref 0.9–3.3)
LYMPH%: 35.4 % (ref 14.0–48.0)
MCH: 32.9 pg (ref 28.0–33.4)
MCHC: 34.1 g/dL (ref 32.0–35.9)
MCV: 97 fL (ref 82–98)
MONO#: 0.4 10*3/uL (ref 0.1–0.9)
MONO%: 10.3 % (ref 0.0–13.0)
NEUT#: 2.1 10*3/uL (ref 1.5–6.5)
NEUT%: 50.9 % (ref 40.0–80.0)
Platelets: 80 10*3/uL — ABNORMAL LOW (ref 145–400)
RBC: 4.2 10*6/uL (ref 4.20–5.70)
RDW: 15.3 % — ABNORMAL HIGH (ref 10.5–14.6)
WBC: 4.1 10*3/uL (ref 4.0–10.0)

## 2009-05-24 ENCOUNTER — Ambulatory Visit: Payer: Self-pay | Admitting: Hematology & Oncology

## 2009-05-24 LAB — CBC WITH DIFFERENTIAL (CANCER CENTER ONLY)
BASO#: 0 10*3/uL (ref 0.0–0.2)
BASO%: 0.8 % (ref 0.0–2.0)
EOS%: 3.9 % (ref 0.0–7.0)
Eosinophils Absolute: 0.2 10*3/uL (ref 0.0–0.5)
HCT: 40.8 % (ref 38.7–49.9)
HGB: 14.2 g/dL (ref 13.0–17.1)
LYMPH#: 1.3 10*3/uL (ref 0.9–3.3)
LYMPH%: 31.5 % (ref 14.0–48.0)
MCH: 33.8 pg — ABNORMAL HIGH (ref 28.0–33.4)
MCHC: 34.7 g/dL (ref 32.0–35.9)
MCV: 97 fL (ref 82–98)
MONO#: 0.4 10*3/uL (ref 0.1–0.9)
MONO%: 8.9 % (ref 0.0–13.0)
NEUT#: 2.3 10*3/uL (ref 1.5–6.5)
NEUT%: 54.9 % (ref 40.0–80.0)
Platelets: 78 10*3/uL — ABNORMAL LOW (ref 145–400)
RBC: 4.19 10*6/uL — ABNORMAL LOW (ref 4.20–5.70)
RDW: 15.4 % — ABNORMAL HIGH (ref 10.5–14.6)
WBC: 4.2 10*3/uL (ref 4.0–10.0)

## 2009-05-24 LAB — COMPREHENSIVE METABOLIC PANEL
ALT: 8 U/L (ref 0–53)
AST: 16 U/L (ref 0–37)
Albumin: 3.7 g/dL (ref 3.5–5.2)
Alkaline Phosphatase: 123 U/L — ABNORMAL HIGH (ref 39–117)
BUN: 7 mg/dL (ref 6–23)
CO2: 25 mEq/L (ref 19–32)
Calcium: 8.9 mg/dL (ref 8.4–10.5)
Chloride: 109 mEq/L (ref 96–112)
Creatinine, Ser: 0.52 mg/dL (ref 0.40–1.50)
Glucose, Bld: 101 mg/dL — ABNORMAL HIGH (ref 70–99)
Potassium: 3.7 mEq/L (ref 3.5–5.3)
Sodium: 143 mEq/L (ref 135–145)
Total Bilirubin: 0.6 mg/dL (ref 0.3–1.2)
Total Protein: 6.5 g/dL (ref 6.0–8.3)

## 2009-06-07 LAB — CBC WITH DIFFERENTIAL (CANCER CENTER ONLY)
BASO#: 0 10*3/uL (ref 0.0–0.2)
BASO%: 0.5 % (ref 0.0–2.0)
EOS%: 3.8 % (ref 0.0–7.0)
Eosinophils Absolute: 0.1 10*3/uL (ref 0.0–0.5)
HCT: 38.9 % (ref 38.7–49.9)
HGB: 13.3 g/dL (ref 13.0–17.1)
LYMPH#: 1.2 10*3/uL (ref 0.9–3.3)
LYMPH%: 34.8 % (ref 14.0–48.0)
MCH: 33.7 pg — ABNORMAL HIGH (ref 28.0–33.4)
MCHC: 34.1 g/dL (ref 32.0–35.9)
MCV: 99 fL — ABNORMAL HIGH (ref 82–98)
MONO#: 0.3 10*3/uL (ref 0.1–0.9)
MONO%: 9.3 % (ref 0.0–13.0)
NEUT#: 1.8 10*3/uL (ref 1.5–6.5)
NEUT%: 51.6 % (ref 40.0–80.0)
Platelets: 60 10*3/uL — ABNORMAL LOW (ref 145–400)
RBC: 3.94 10*6/uL — ABNORMAL LOW (ref 4.20–5.70)
RDW: 15.3 % — ABNORMAL HIGH (ref 10.5–14.6)
WBC: 3.6 10*3/uL — ABNORMAL LOW (ref 4.0–10.0)

## 2009-06-07 LAB — COMPREHENSIVE METABOLIC PANEL
ALT: 12 U/L (ref 0–53)
AST: 22 U/L (ref 0–37)
Albumin: 3.8 g/dL (ref 3.5–5.2)
Alkaline Phosphatase: 140 U/L — ABNORMAL HIGH (ref 39–117)
BUN: 7 mg/dL (ref 6–23)
CO2: 25 mEq/L (ref 19–32)
Calcium: 9.1 mg/dL (ref 8.4–10.5)
Chloride: 108 mEq/L (ref 96–112)
Creatinine, Ser: 0.66 mg/dL (ref 0.40–1.50)
Glucose, Bld: 129 mg/dL — ABNORMAL HIGH (ref 70–99)
Potassium: 3.4 mEq/L — ABNORMAL LOW (ref 3.5–5.3)
Sodium: 144 mEq/L (ref 135–145)
Total Bilirubin: 0.7 mg/dL (ref 0.3–1.2)
Total Protein: 6.8 g/dL (ref 6.0–8.3)

## 2009-06-14 LAB — COMPREHENSIVE METABOLIC PANEL
ALT: 10 U/L (ref 0–53)
AST: 20 U/L (ref 0–37)
Albumin: 3.8 g/dL (ref 3.5–5.2)
Alkaline Phosphatase: 134 U/L — ABNORMAL HIGH (ref 39–117)
BUN: 8 mg/dL (ref 6–23)
CO2: 24 mEq/L (ref 19–32)
Calcium: 9 mg/dL (ref 8.4–10.5)
Chloride: 107 mEq/L (ref 96–112)
Creatinine, Ser: 0.71 mg/dL (ref 0.40–1.50)
Glucose, Bld: 101 mg/dL — ABNORMAL HIGH (ref 70–99)
Potassium: 3.6 mEq/L (ref 3.5–5.3)
Sodium: 144 mEq/L (ref 135–145)
Total Bilirubin: 0.6 mg/dL (ref 0.3–1.2)
Total Protein: 6.8 g/dL (ref 6.0–8.3)

## 2009-06-14 LAB — CBC WITH DIFFERENTIAL (CANCER CENTER ONLY)
BASO#: 0 10*3/uL (ref 0.0–0.2)
BASO%: 0.4 % (ref 0.0–2.0)
EOS%: 3.4 % (ref 0.0–7.0)
Eosinophils Absolute: 0.1 10*3/uL (ref 0.0–0.5)
HCT: 39.1 % (ref 38.7–49.9)
HGB: 13.6 g/dL (ref 13.0–17.1)
LYMPH#: 1.3 10*3/uL (ref 0.9–3.3)
LYMPH%: 43 % (ref 14.0–48.0)
MCH: 33.7 pg — ABNORMAL HIGH (ref 28.0–33.4)
MCHC: 34.7 g/dL (ref 32.0–35.9)
MCV: 97 fL (ref 82–98)
MONO#: 0.4 10*3/uL (ref 0.1–0.9)
MONO%: 13.1 % — ABNORMAL HIGH (ref 0.0–13.0)
NEUT#: 1.2 10*3/uL — ABNORMAL LOW (ref 1.5–6.5)
NEUT%: 40.1 % (ref 40.0–80.0)
Platelets: 96 10*3/uL — ABNORMAL LOW (ref 145–400)
RBC: 4.03 10*6/uL — ABNORMAL LOW (ref 4.20–5.70)
RDW: 14.7 % — ABNORMAL HIGH (ref 10.5–14.6)
WBC: 3 10*3/uL — ABNORMAL LOW (ref 4.0–10.0)

## 2009-06-25 ENCOUNTER — Ambulatory Visit: Payer: Self-pay | Admitting: Hematology & Oncology

## 2009-06-28 LAB — CBC WITH DIFFERENTIAL (CANCER CENTER ONLY)
BASO#: 0 10*3/uL (ref 0.0–0.2)
BASO%: 0.4 % (ref 0.0–2.0)
EOS%: 2.3 % (ref 0.0–7.0)
Eosinophils Absolute: 0.1 10*3/uL (ref 0.0–0.5)
HCT: 37.7 % — ABNORMAL LOW (ref 38.7–49.9)
HGB: 13.2 g/dL (ref 13.0–17.1)
LYMPH#: 1.2 10*3/uL (ref 0.9–3.3)
LYMPH%: 26.5 % (ref 14.0–48.0)
MCH: 34 pg — ABNORMAL HIGH (ref 28.0–33.4)
MCHC: 35 g/dL (ref 32.0–35.9)
MCV: 97 fL (ref 82–98)
MONO#: 0.3 10*3/uL (ref 0.1–0.9)
MONO%: 7.5 % (ref 0.0–13.0)
NEUT#: 2.8 10*3/uL (ref 1.5–6.5)
NEUT%: 63.3 % (ref 40.0–80.0)
Platelets: 70 10*3/uL — ABNORMAL LOW (ref 145–400)
RBC: 3.87 10*6/uL — ABNORMAL LOW (ref 4.20–5.70)
RDW: 14.3 % (ref 10.5–14.6)
WBC: 4.4 10*3/uL (ref 4.0–10.0)

## 2009-06-28 LAB — COMPREHENSIVE METABOLIC PANEL
ALT: 12 U/L (ref 0–53)
AST: 18 U/L (ref 0–37)
Albumin: 3.8 g/dL (ref 3.5–5.2)
Alkaline Phosphatase: 130 U/L — ABNORMAL HIGH (ref 39–117)
BUN: 11 mg/dL (ref 6–23)
CO2: 27 mEq/L (ref 19–32)
Calcium: 8.8 mg/dL (ref 8.4–10.5)
Chloride: 108 mEq/L (ref 96–112)
Creatinine, Ser: 0.69 mg/dL (ref 0.40–1.50)
Glucose, Bld: 91 mg/dL (ref 70–99)
Potassium: 3.6 mEq/L (ref 3.5–5.3)
Sodium: 142 mEq/L (ref 135–145)
Total Bilirubin: 0.6 mg/dL (ref 0.3–1.2)
Total Protein: 6.5 g/dL (ref 6.0–8.3)

## 2009-07-12 LAB — CBC WITH DIFFERENTIAL (CANCER CENTER ONLY)
BASO#: 0 10*3/uL (ref 0.0–0.2)
BASO%: 0.3 % (ref 0.0–2.0)
EOS%: 4.5 % (ref 0.0–7.0)
Eosinophils Absolute: 0.2 10*3/uL (ref 0.0–0.5)
HCT: 39.2 % (ref 38.7–49.9)
HGB: 13.6 g/dL (ref 13.0–17.1)
LYMPH#: 1.2 10*3/uL (ref 0.9–3.3)
LYMPH%: 28.8 % (ref 14.0–48.0)
MCH: 34.3 pg — ABNORMAL HIGH (ref 28.0–33.4)
MCHC: 34.8 g/dL (ref 32.0–35.9)
MCV: 99 fL — ABNORMAL HIGH (ref 82–98)
MONO#: 0.3 10*3/uL (ref 0.1–0.9)
MONO%: 8.2 % (ref 0.0–13.0)
NEUT#: 2.4 10*3/uL (ref 1.5–6.5)
NEUT%: 58.2 % (ref 40.0–80.0)
Platelets: 86 10*3/uL — ABNORMAL LOW (ref 145–400)
RBC: 3.97 10*6/uL — ABNORMAL LOW (ref 4.20–5.70)
RDW: 14.5 % (ref 10.5–14.6)
WBC: 4.1 10*3/uL (ref 4.0–10.0)

## 2009-07-12 LAB — COMPREHENSIVE METABOLIC PANEL
ALT: 14 U/L (ref 0–53)
AST: 19 U/L (ref 0–37)
Albumin: 4 g/dL (ref 3.5–5.2)
Alkaline Phosphatase: 133 U/L — ABNORMAL HIGH (ref 39–117)
BUN: 8 mg/dL (ref 6–23)
CO2: 27 mEq/L (ref 19–32)
Calcium: 9.2 mg/dL (ref 8.4–10.5)
Chloride: 108 mEq/L (ref 96–112)
Creatinine, Ser: 0.64 mg/dL (ref 0.40–1.50)
Glucose, Bld: 107 mg/dL — ABNORMAL HIGH (ref 70–99)
Potassium: 3.8 mEq/L (ref 3.5–5.3)
Sodium: 143 mEq/L (ref 135–145)
Total Bilirubin: 0.7 mg/dL (ref 0.3–1.2)
Total Protein: 6.5 g/dL (ref 6.0–8.3)

## 2009-07-26 ENCOUNTER — Ambulatory Visit: Payer: Self-pay | Admitting: Hematology & Oncology

## 2009-07-26 LAB — CBC WITH DIFFERENTIAL (CANCER CENTER ONLY)
BASO#: 0 10*3/uL (ref 0.0–0.2)
BASO%: 0.3 % (ref 0.0–2.0)
EOS%: 3 % (ref 0.0–7.0)
Eosinophils Absolute: 0.1 10*3/uL (ref 0.0–0.5)
HCT: 36.5 % — ABNORMAL LOW (ref 38.7–49.9)
HGB: 13 g/dL (ref 13.0–17.1)
LYMPH#: 1.1 10*3/uL (ref 0.9–3.3)
LYMPH%: 32 % (ref 14.0–48.0)
MCH: 34.9 pg — ABNORMAL HIGH (ref 28.0–33.4)
MCHC: 35.6 g/dL (ref 32.0–35.9)
MCV: 98 fL (ref 82–98)
MONO#: 0.3 10*3/uL (ref 0.1–0.9)
MONO%: 9.5 % (ref 0.0–13.0)
NEUT#: 1.8 10*3/uL (ref 1.5–6.5)
NEUT%: 55.2 % (ref 40.0–80.0)
Platelets: 75 10*3/uL — ABNORMAL LOW (ref 145–400)
RBC: 3.73 10*6/uL — ABNORMAL LOW (ref 4.20–5.70)
RDW: 13.2 % (ref 10.5–14.6)
WBC: 3.3 10*3/uL — ABNORMAL LOW (ref 4.0–10.0)

## 2009-07-27 LAB — COMPREHENSIVE METABOLIC PANEL
ALT: 12 U/L (ref 0–53)
AST: 19 U/L (ref 0–37)
Albumin: 3.6 g/dL (ref 3.5–5.2)
Alkaline Phosphatase: 147 U/L — ABNORMAL HIGH (ref 39–117)
BUN: 9 mg/dL (ref 6–23)
CO2: 25 mEq/L (ref 19–32)
Calcium: 8.9 mg/dL (ref 8.4–10.5)
Chloride: 110 mEq/L (ref 96–112)
Creatinine, Ser: 0.69 mg/dL (ref 0.40–1.50)
Glucose, Bld: 101 mg/dL — ABNORMAL HIGH (ref 70–99)
Potassium: 3.6 mEq/L (ref 3.5–5.3)
Sodium: 145 mEq/L (ref 135–145)
Total Bilirubin: 0.7 mg/dL (ref 0.3–1.2)
Total Protein: 6.8 g/dL (ref 6.0–8.3)

## 2009-07-27 LAB — VITAMIN D 25 HYDROXY (VIT D DEFICIENCY, FRACTURES): Vit D, 25-Hydroxy: 8 ng/mL — ABNORMAL LOW (ref 30–89)

## 2009-07-27 LAB — CEA: CEA: 1.3 ng/mL (ref 0.0–5.0)

## 2009-08-09 LAB — COMPREHENSIVE METABOLIC PANEL
ALT: 13 U/L (ref 0–53)
AST: 19 U/L (ref 0–37)
Albumin: 3.8 g/dL (ref 3.5–5.2)
Alkaline Phosphatase: 130 U/L — ABNORMAL HIGH (ref 39–117)
BUN: 9 mg/dL (ref 6–23)
CO2: 27 mEq/L (ref 19–32)
Calcium: 8.9 mg/dL (ref 8.4–10.5)
Chloride: 106 mEq/L (ref 96–112)
Creatinine, Ser: 0.69 mg/dL (ref 0.40–1.50)
Glucose, Bld: 129 mg/dL — ABNORMAL HIGH (ref 70–99)
Potassium: 4 mEq/L (ref 3.5–5.3)
Sodium: 140 mEq/L (ref 135–145)
Total Bilirubin: 0.7 mg/dL (ref 0.3–1.2)
Total Protein: 6.5 g/dL (ref 6.0–8.3)

## 2009-08-09 LAB — CBC WITH DIFFERENTIAL (CANCER CENTER ONLY)
BASO#: 0 10*3/uL (ref 0.0–0.2)
BASO%: 0.3 % (ref 0.0–2.0)
EOS%: 2.4 % (ref 0.0–7.0)
Eosinophils Absolute: 0.1 10*3/uL (ref 0.0–0.5)
HCT: 37.7 % — ABNORMAL LOW (ref 38.7–49.9)
HGB: 13.4 g/dL (ref 13.0–17.1)
LYMPH#: 1.2 10*3/uL (ref 0.9–3.3)
LYMPH%: 26.5 % (ref 14.0–48.0)
MCH: 35.3 pg — ABNORMAL HIGH (ref 28.0–33.4)
MCHC: 35.5 g/dL (ref 32.0–35.9)
MCV: 99 fL — ABNORMAL HIGH (ref 82–98)
MONO#: 0.4 10*3/uL (ref 0.1–0.9)
MONO%: 8.8 % (ref 0.0–13.0)
NEUT#: 2.9 10*3/uL (ref 1.5–6.5)
NEUT%: 62 % (ref 40.0–80.0)
Platelets: 87 10*3/uL — ABNORMAL LOW (ref 145–400)
RBC: 3.79 10*6/uL — ABNORMAL LOW (ref 4.20–5.70)
RDW: 12.8 % (ref 10.5–14.6)
WBC: 4.6 10*3/uL (ref 4.0–10.0)

## 2009-08-25 ENCOUNTER — Ambulatory Visit: Payer: Self-pay | Admitting: Hematology & Oncology

## 2009-08-26 LAB — CBC WITH DIFFERENTIAL (CANCER CENTER ONLY)
BASO#: 0 10*3/uL (ref 0.0–0.2)
BASO%: 0.4 % (ref 0.0–2.0)
EOS%: 3.8 % (ref 0.0–7.0)
Eosinophils Absolute: 0.1 10*3/uL (ref 0.0–0.5)
HCT: 38.9 % (ref 38.7–49.9)
HGB: 13.4 g/dL (ref 13.0–17.1)
LYMPH#: 1.6 10*3/uL (ref 0.9–3.3)
LYMPH%: 44.8 % (ref 14.0–48.0)
MCH: 34.4 pg — ABNORMAL HIGH (ref 28.0–33.4)
MCHC: 34.5 g/dL (ref 32.0–35.9)
MCV: 100 fL — ABNORMAL HIGH (ref 82–98)
MONO#: 0.5 10*3/uL (ref 0.1–0.9)
MONO%: 12.9 % (ref 0.0–13.0)
NEUT#: 1.3 10*3/uL — ABNORMAL LOW (ref 1.5–6.5)
NEUT%: 38.1 % — ABNORMAL LOW (ref 40.0–80.0)
Platelets: 130 10*3/uL — ABNORMAL LOW (ref 145–400)
RBC: 3.89 10*6/uL — ABNORMAL LOW (ref 4.20–5.70)
RDW: 12.8 % (ref 10.5–14.6)
WBC: 3.5 10*3/uL — ABNORMAL LOW (ref 4.0–10.0)

## 2009-08-26 LAB — BASIC METABOLIC PANEL
BUN: 13 mg/dL (ref 6–23)
CO2: 24 mEq/L (ref 19–32)
Calcium: 8.5 mg/dL (ref 8.4–10.5)
Chloride: 108 mEq/L (ref 96–112)
Creatinine, Ser: 0.69 mg/dL (ref 0.40–1.50)
Glucose, Bld: 91 mg/dL (ref 70–99)
Potassium: 3.7 mEq/L (ref 3.5–5.3)
Sodium: 143 mEq/L (ref 135–145)

## 2009-08-26 LAB — CEA: CEA: 1.3 ng/mL (ref 0.0–5.0)

## 2009-09-29 ENCOUNTER — Ambulatory Visit: Payer: Self-pay | Admitting: Diagnostic Radiology

## 2009-09-29 ENCOUNTER — Ambulatory Visit (HOSPITAL_BASED_OUTPATIENT_CLINIC_OR_DEPARTMENT_OTHER): Admission: RE | Admit: 2009-09-29 | Discharge: 2009-09-29 | Payer: Self-pay | Admitting: Hematology & Oncology

## 2009-09-29 IMAGING — CT CT ABD-PELV W/ CM
4 of 5 series · 9 of 46 positions shown, 14 images · IV contrast (APPLIED)
Comparison: Chest CT [DATE] and abdominal pelvic CT
[DATE].

CT CHEST

CLINICAL DATA: Restaging colon cancer status post partial
colectomy and completion of chemotherapy in [DATE].

CT CHEST, ABDOMEN AND PELVIS WITH CONTRAST
TECHNIQUE: Multidetector CT imaging of the chest, abdomen and
pelvis was performed following the standard protocol during bolus
administration of intravenous contrast.
Contrast: 100 ml [8T] intravenously.

[Series 2: chest/abd/pel 5.0 b31f · axial · 0.88mm/px · z∈[-628,-478]mm · 3 of 134 slices shown]
[im 15/134  soft-tissue]
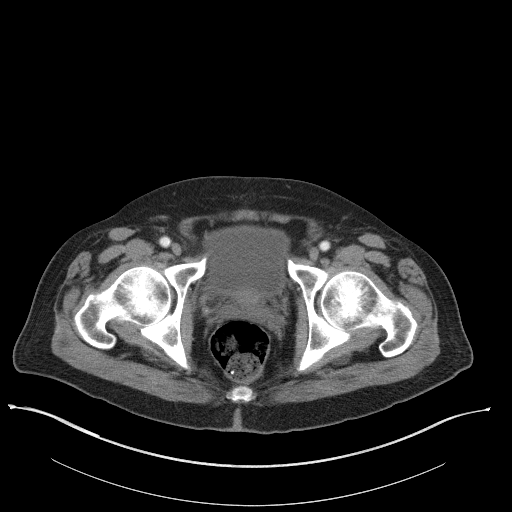
[im 30/134  soft-tissue]
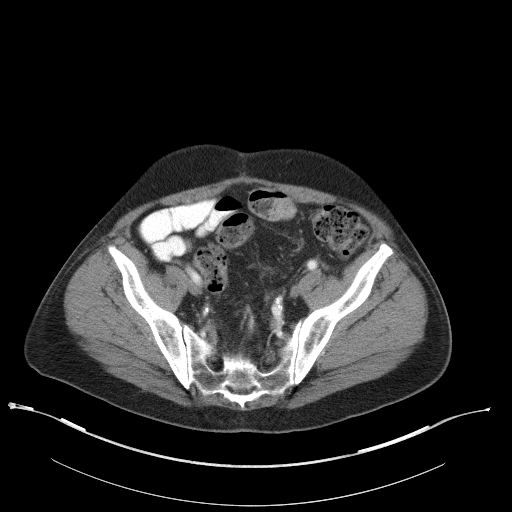
[im 45/134  soft-tissue]
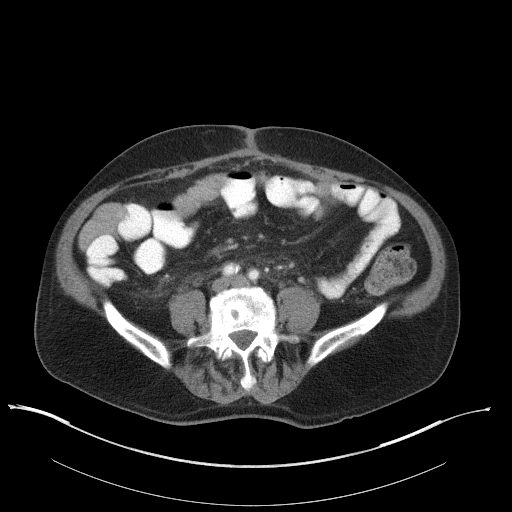

[Series 5: chest/abd/pel 3.0 coronal · coronal · 0.78mm/px · 3 of 90 slices shown, 4 images]
[im 30/90  soft-tissue]
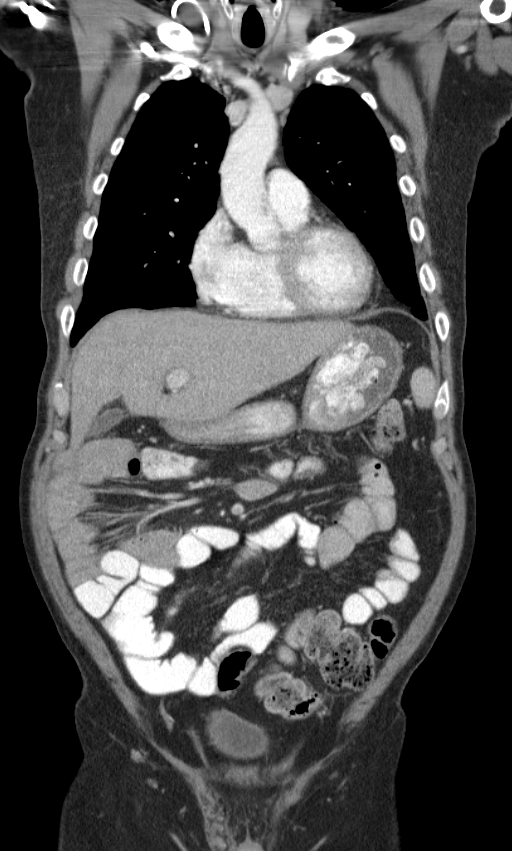
[im 40/90  soft-tissue]
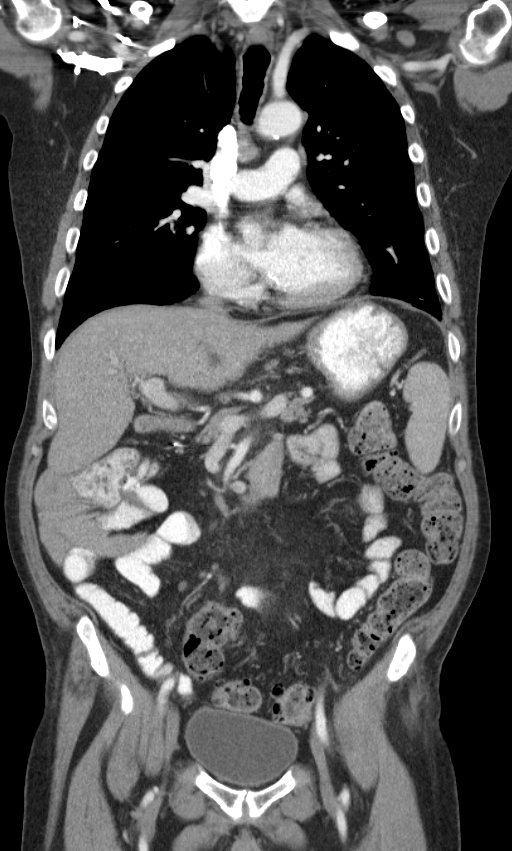
[im 40/90  bone]
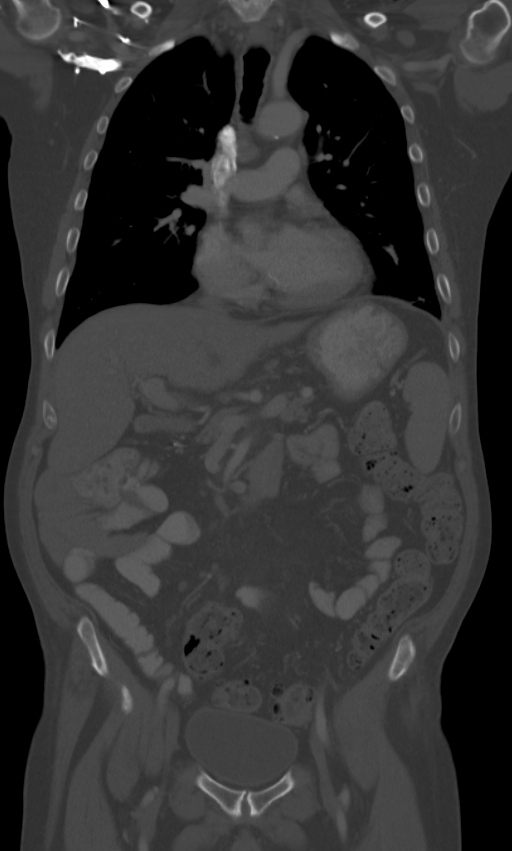
[im 50/90  soft-tissue]
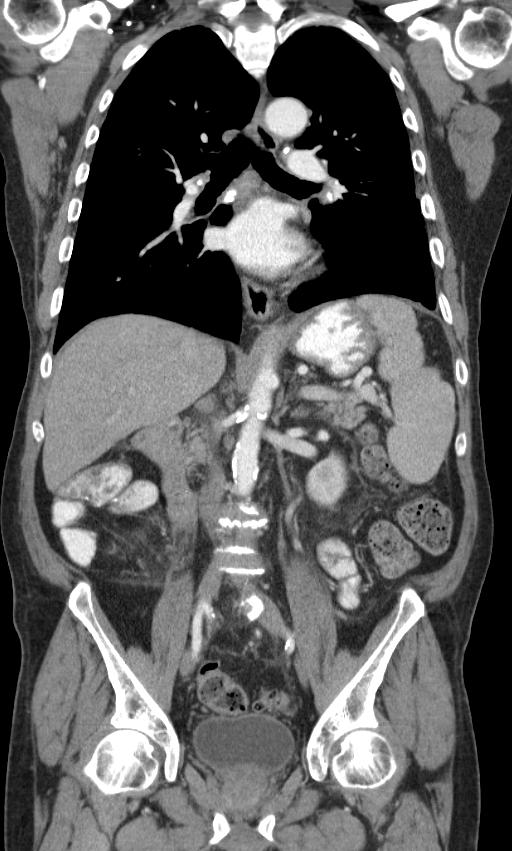

[Series 6: chest/abd/pel 3.0 sagittal · sagittal · 0.71mm/px · 1 of 125 slices shown, 2 images]
[im 42/125  soft-tissue]
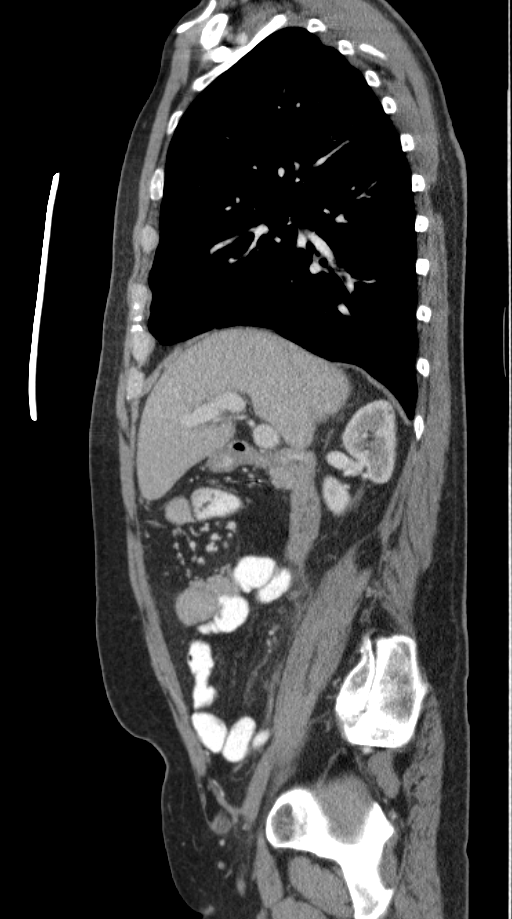
[im 42/125  bone]
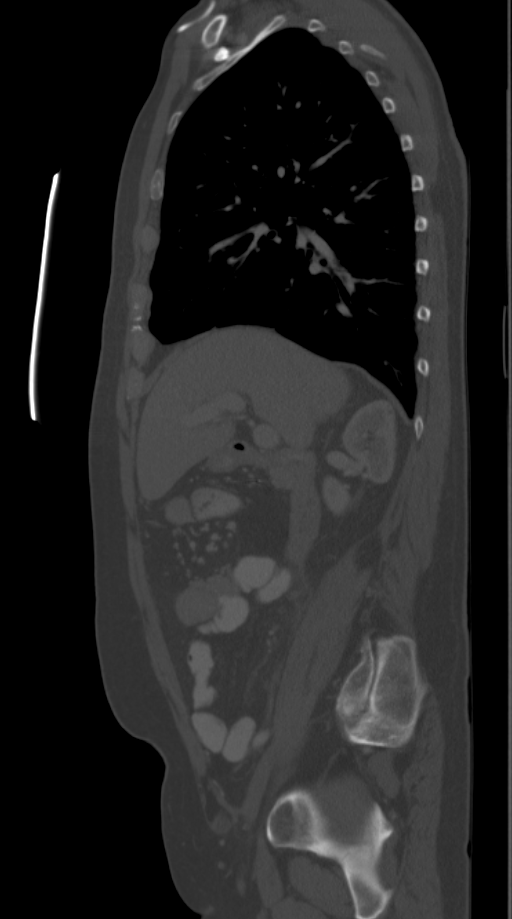

[Series 7: renal delay 5.0 b30f · axial · delayed · 0.87mm/px · z∈[-404,-359]mm · 2 of 29 slices shown, 5 images]
[im 10/29  soft-tissue]
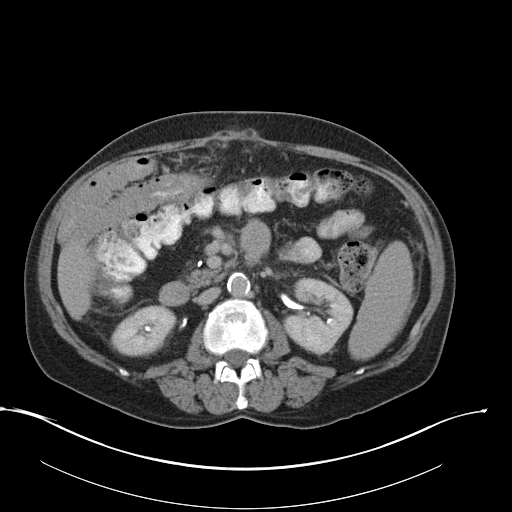
[im 10/29  lung]
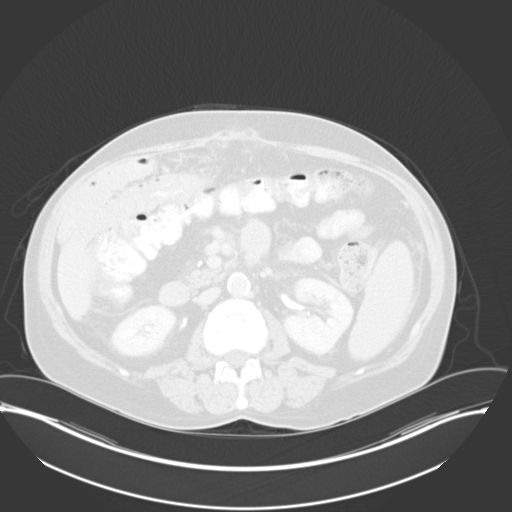
[im 10/29  bone]
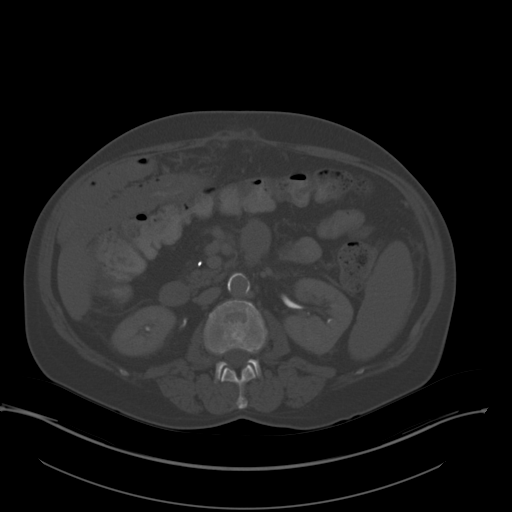
[im 19/29  soft-tissue]
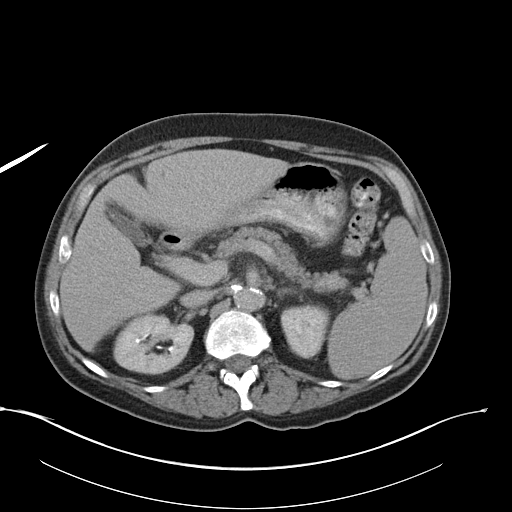
[im 19/29  lung]
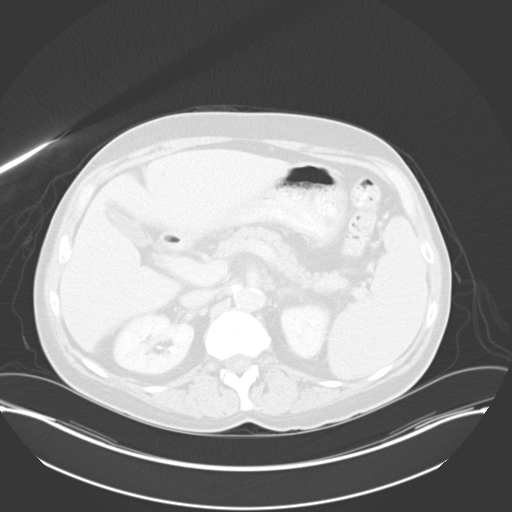

[9 of 46 positions shown; findings below may reference images not displayed]

FINDINGS: Right IJ central venous catheter extends into the SVC.
Several calcified mediastinal and hilar lymph nodes are unchanged.
There is no progressive adenopathy.  There is no pleural or
pericardial effusion.  Pleural calcifications are present on the
left.

The subpleural irregular density posteriorly in the left lower lobe
is unchanged and retains an appearance most compatible with
scarring or rounded atelectasis.  There are no suspicious pulmonary
nodules on the left.  The previously demonstrated 6 mm nodule in
the superior segment of the right lower lobe has enlarged,
measuring 11 x 11 mm on image 26.  This is irregular in shape and
best classified as a ground-glass opacity with solid components.
No other pulmonary nodules are seen on the right.
IMPRESSION: 1.  Enlarging mixed solid and ground-glass opacity in the superior
segment of the right lower lobe.  This would be an atypical
appearance for metastatic disease, although is concerning for
possible primary lung cancer (bronchioalveolar or adenocarcinoma).
2.  Stable left lower lobe scarring and calcified mediastinal /
hilar lymph nodes.
3.  No typical metastases are identified.
4.  PET CT may be helpful to evaluate for metabolic activity within
the enlarging right lower lobe nodule.

CT ABDOMEN AND PELVIS
FINDINGS: Patient has undergone interval right colectomy and
ileocolonic anastomosis.  There is no evidence of anastomotic
stricture or leak, although there is some soft tissue stranding in
the retroperitoneal fat on the right.  There is no discrete
adenopathy, ascites or peritoneal nodularity.  There is no
significant residual bowel distension.

The liver has a stable appearance without evidence of metastatic
disease.  The spleen, pancreas, adrenal glands and kidneys appear
stable with a probable small cyst in the upper pole of the right
kidney.  The gallbladder is contracted and may contain a small
gallstone or polyp on image 66.  There is no biliary dilatation.

Aortic atherosclerosis and lumbar spine degenerative
changes/Schmorl's nodes are unchanged.  There are no suspicious
osseous findings.  Mild enlargement of the prostate gland appears
stable.
IMPRESSION: 1.  Postsurgical changes status post interval right colectomy and
ileocolonic anastomosis.  Retroperitoneal soft tissue stranding on
the right may reflect postsurgical fibrosis.  There is no discrete
mass to suggest local recurrence or metastatic disease.
2.  No evidence of anastomotic stricture.
3.  No evidence of hepatic metastatic disease.

## 2009-10-06 ENCOUNTER — Ambulatory Visit: Payer: Self-pay | Admitting: Hematology & Oncology

## 2009-10-14 ENCOUNTER — Ambulatory Visit (HOSPITAL_COMMUNITY): Admission: RE | Admit: 2009-10-14 | Discharge: 2009-10-14 | Payer: Self-pay | Admitting: Hematology & Oncology

## 2009-10-14 IMAGING — PT NM PET TUM IMG INITIAL (PI) SKULL BASE T - THIGH
8 series · 25 of 25 positions shown · non-contrast
Comparison: None.

CLINICAL DATA: Initial treatment strategy for right lung mass.
Colon carcinoma.

NUCLEAR MEDICINE PET CT INITIAL (PI) SKULL BASE TO THIGH
TECHNIQUE: 15.9 mCi F-18 FDG was injected intravenously via the
right arm.  Full-ring PET imaging was performed from the skull base
through the mid-thighs 60  minutes after injection.  CT data was
obtained and used for attenuation correction and anatomic
localization only.  (This was not acquired as a diagnostic CT
examination.)
Fasting Blood Glucose:  90

[Series 1: pet ac · axial · 3.3mm · 4.69mm/px · z∈[-870,+0]mm · 5 of 267 slices shown]
[im 1/267]
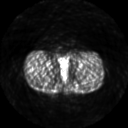
[im 67/267]
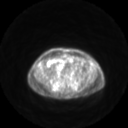
[im 134/267]
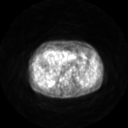
[im 200/267]
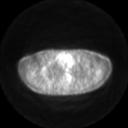
[im 267/267]
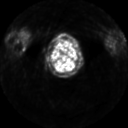

[Series 2: pet nac · axial · 3.3mm · 4.69mm/px · z∈[-870,+0]mm · 5 of 267 slices shown]
[im 1/267]
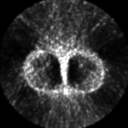
[im 67/267]
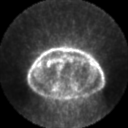
[im 134/267]
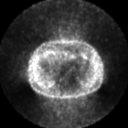
[im 200/267]
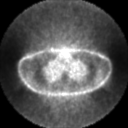
[im 267/267]
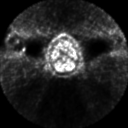

[Series 2: ct images · axial · 3.8mm · 0.98mm/px · z∈[-870,+0]mm · 5 of 267 slices shown]
[im 1/267]
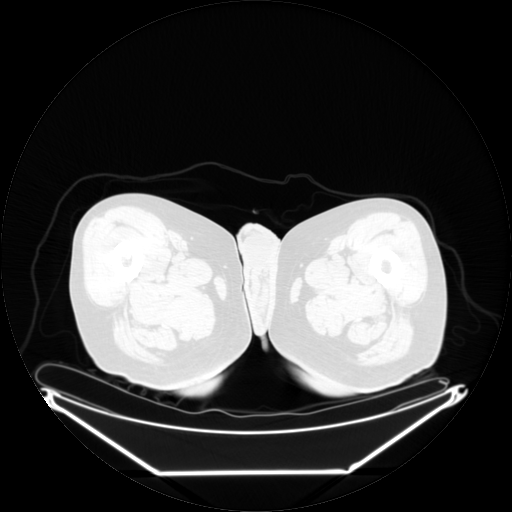
[im 67/267]
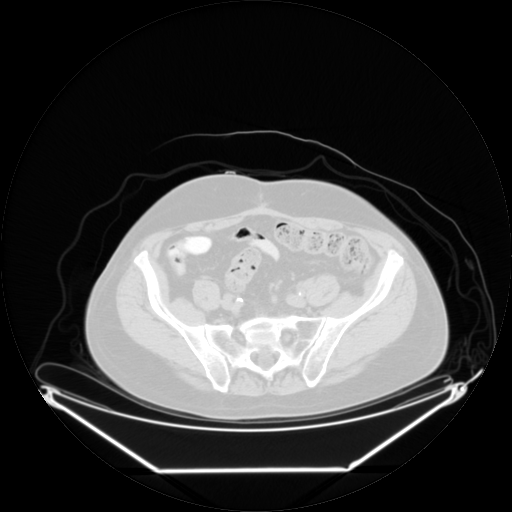
[im 134/267]
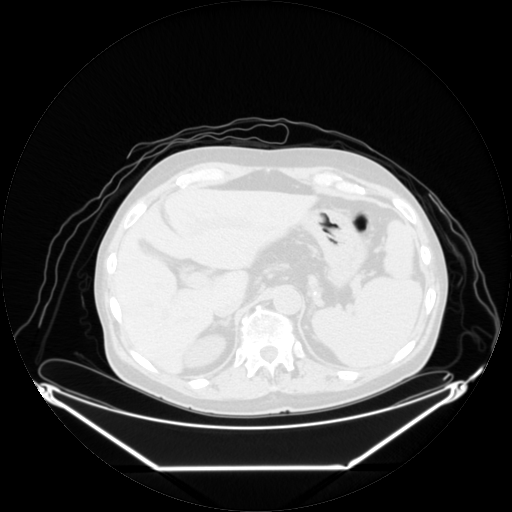
[im 200/267]
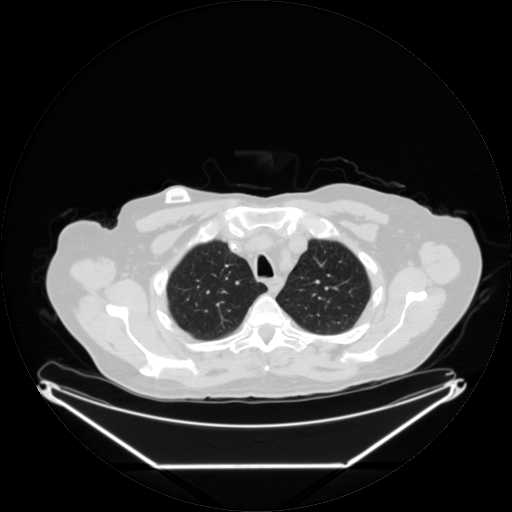
[im 267/267  brain]
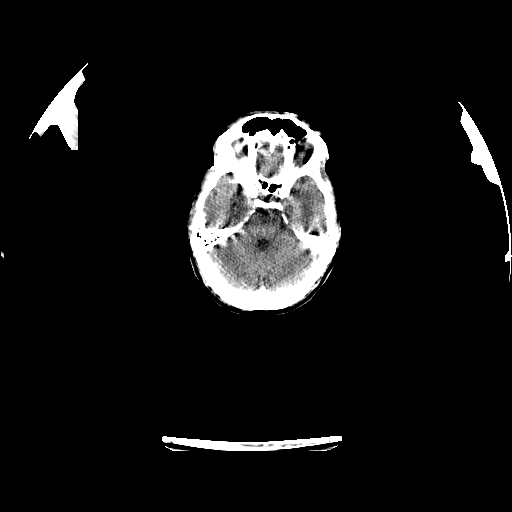

[Series 123: mip · coronal · 3.3mm · 4.69mm/px · 1 of 30 slices shown]
[im 1/30]
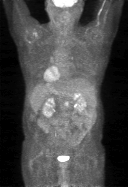

[Series 150: reformatted · axial · 3.3mm · 1.02mm/px · 1 of 6 slices shown (1 of 4)]
[im 1/6]
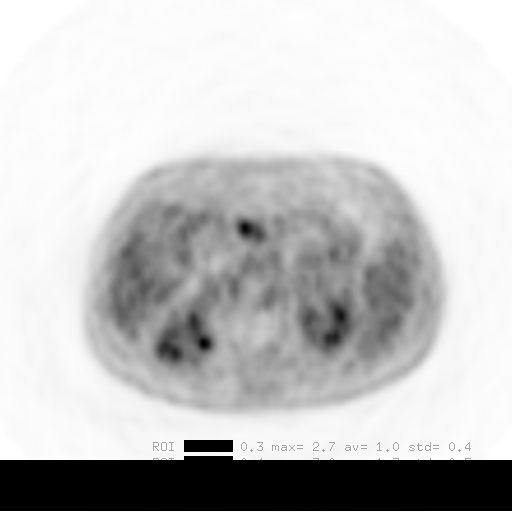

[Series 151: reformatted · axial · 3.3mm · 3.91mm/px · z∈[-870,+0]mm · 6 of 265 slices shown (2 of 4)]
[im 1/265]
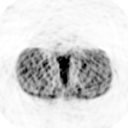
[im 53/265]
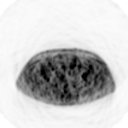
[im 106/265]
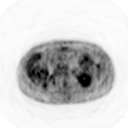
[im 159/265]
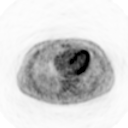
[im 212/265]
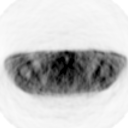
[im 265/265]
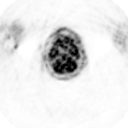

[Series 153: reformatted · coronal · 4.7mm · 6.98mm/px · 1 of 67 slices shown (3 of 4)]
[im 1/67]
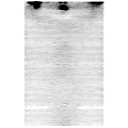

[Series 250: reformatted · axial · 3.8mm · 0.98mm/px · 1 of 3 slices shown (4 of 4)]
[im 1/3]
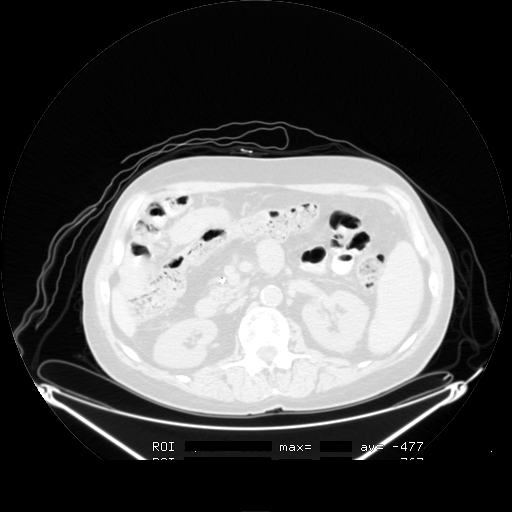

[25 of 25 positions shown; findings below may reference images not displayed]

FINDINGS: 10 mm nodule in the superior segment of the right lower
lobe shows hypermetabolic activity, with a maximum SUV of 2.3.
This is suspicious for malignancy.

Small less than 1 cm right paratracheal and bilateral hilar lymph
nodes are seen which show mild hypermetabolic activity, with
maximum SUV of 3.3 in the right paratracheal node.

No hypermetabolic adenopathy or masses are identified within the
neck.  In the abdomen, there is a focal area of hypermetabolic
activity with maximum SUV of 6.7 in the transverse colon.  This
appears to correlate with a short segment area of the colonic wall
thickening on noncontrast CT image 154, and this raises suspicion
for colon carcinoma.  No other hypermetabolic masses or adenopathy
identified within the abdomen or pelvis.
IMPRESSION: 1.  Hypermetabolic 10 mm nodule in the superior segment of the
right lower lobe, suspicious for carcinoma.
2.  Small less than 1 cm hypermetabolic lymph nodes in the right
paratracheal region and bilateral hilar regions.
3. Focal hypermetabolic activity in the transverse colon, which
correlates with short segment colonic wall thickening on
corresponding CT images.  This is suspicious for colon carcinoma,
and further evaluation with colonoscopy or barium enema is
recommended.

## 2009-10-27 ENCOUNTER — Ambulatory Visit: Payer: Self-pay | Admitting: Thoracic Surgery

## 2009-11-08 ENCOUNTER — Inpatient Hospital Stay (HOSPITAL_COMMUNITY): Admission: RE | Admit: 2009-11-08 | Discharge: 2009-11-13 | Payer: Self-pay | Admitting: Thoracic Surgery

## 2009-11-08 ENCOUNTER — Ambulatory Visit: Payer: Self-pay | Admitting: Thoracic Surgery

## 2009-11-08 ENCOUNTER — Encounter: Payer: Self-pay | Admitting: Thoracic Surgery

## 2009-11-08 IMAGING — CR DG CHEST 1V PORT
1 series · 1 of 1 positions shown · non-contrast
Comparison: [E5] hours the same day.

CLINICAL DATA: 58-year-old male status post VATS on the right.

PORTABLE CHEST - 1 VIEW

[view not recorded]
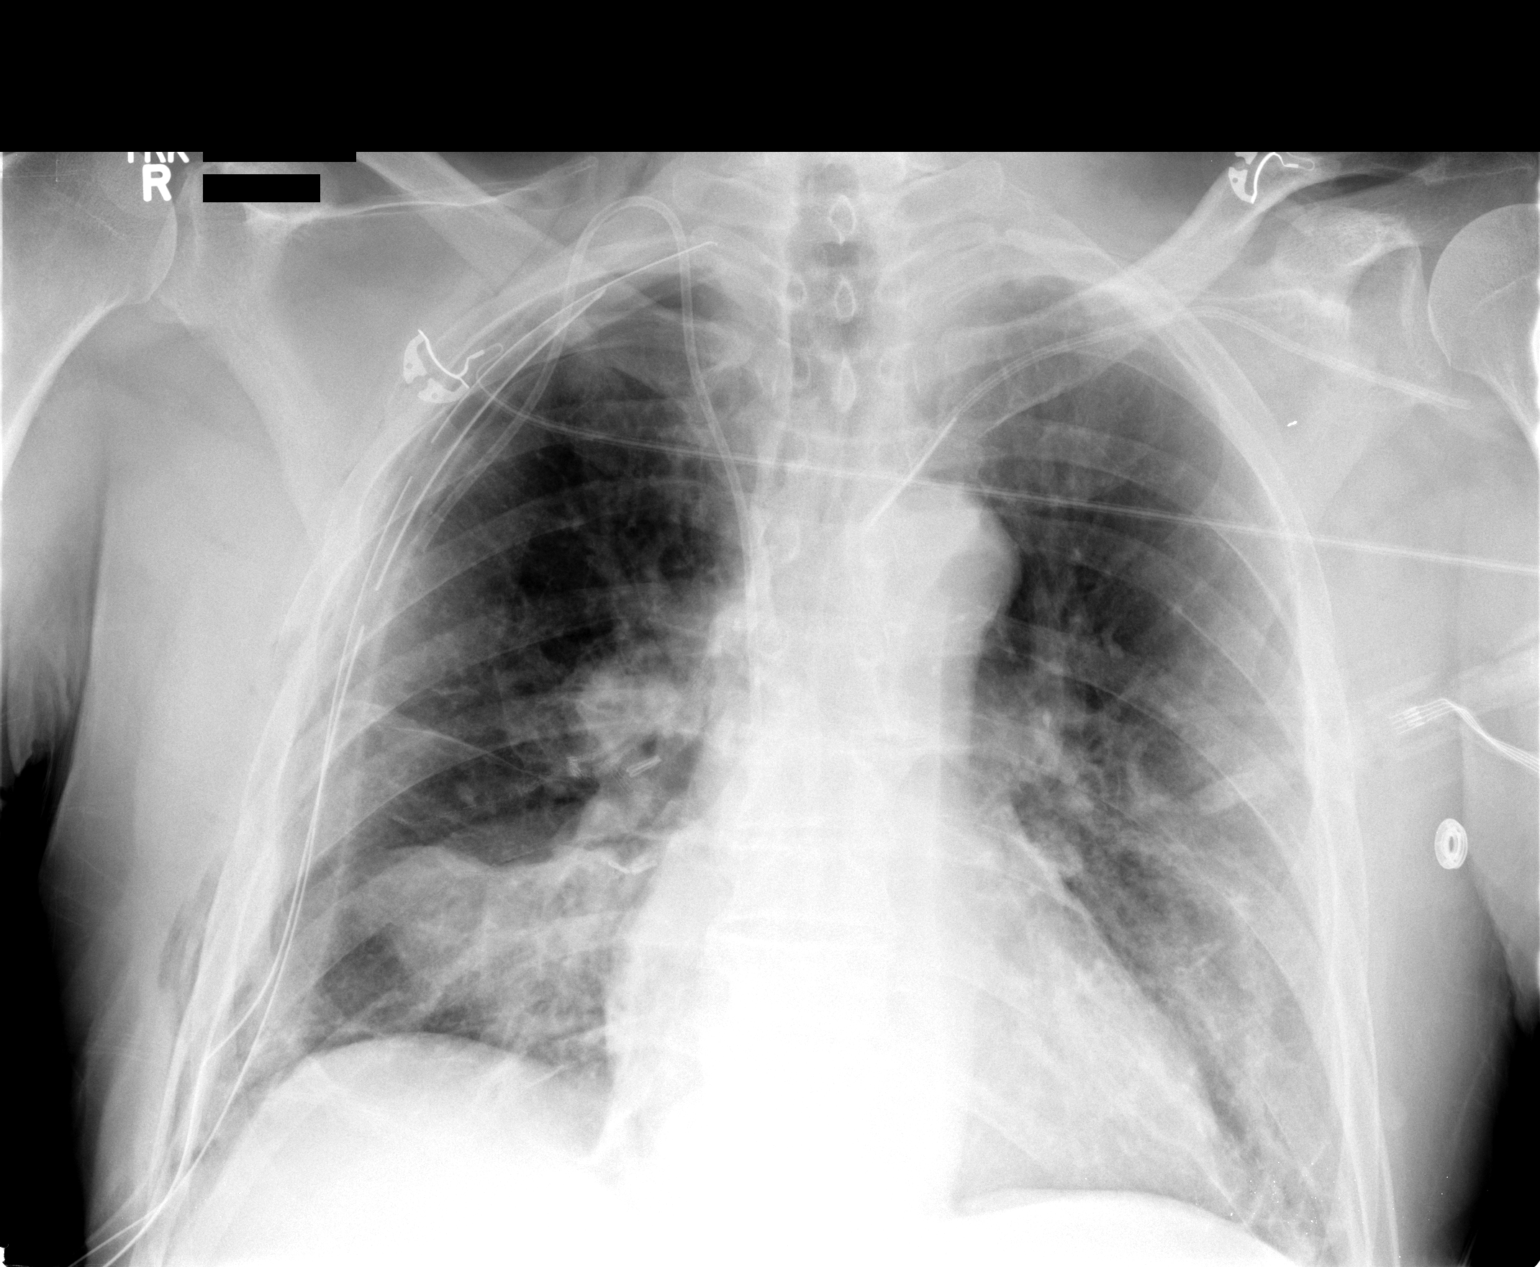

[1 of 1 positions shown; findings below may reference images not displayed]

FINDINGS: Semi upright AP portable view [E5] hours.  2 new right-
sided chest tubes are in place.  Moderate volume of subcutaneous
gas along the right chest wall and extending into the right neck.
No pneumothorax identified.  Stable right chest Port-A-Cath.  New
left subclavian approach central venous catheter, tip at the level
of the left innominate vein.  New right perihilar and infrahilar
confluent opacity.  Mild retrocardiac atelectasis.  Stable cardiac
size and mediastinal contours.  No pleural effusion identified.
IMPRESSION: 1.  New right chest tubes in place.  No pneumothorax.
2.  New left subclavian central line, tip at the level of the left
innominate vein.
3.  New right perihilar and infrahilar opacity, likely
postoperative.

## 2009-11-08 IMAGING — CR DG CHEST 2V
2 series · 2 of 2 positions shown · non-contrast
Comparison: CT of [DATE] and plain film of [DATE].

CLINICAL DATA: Right lower lobe superior segmentectomy today.
Smoker.  Right lower lobe nodule.

CHEST - 2 VIEW

[view not recorded (1 of 2)]
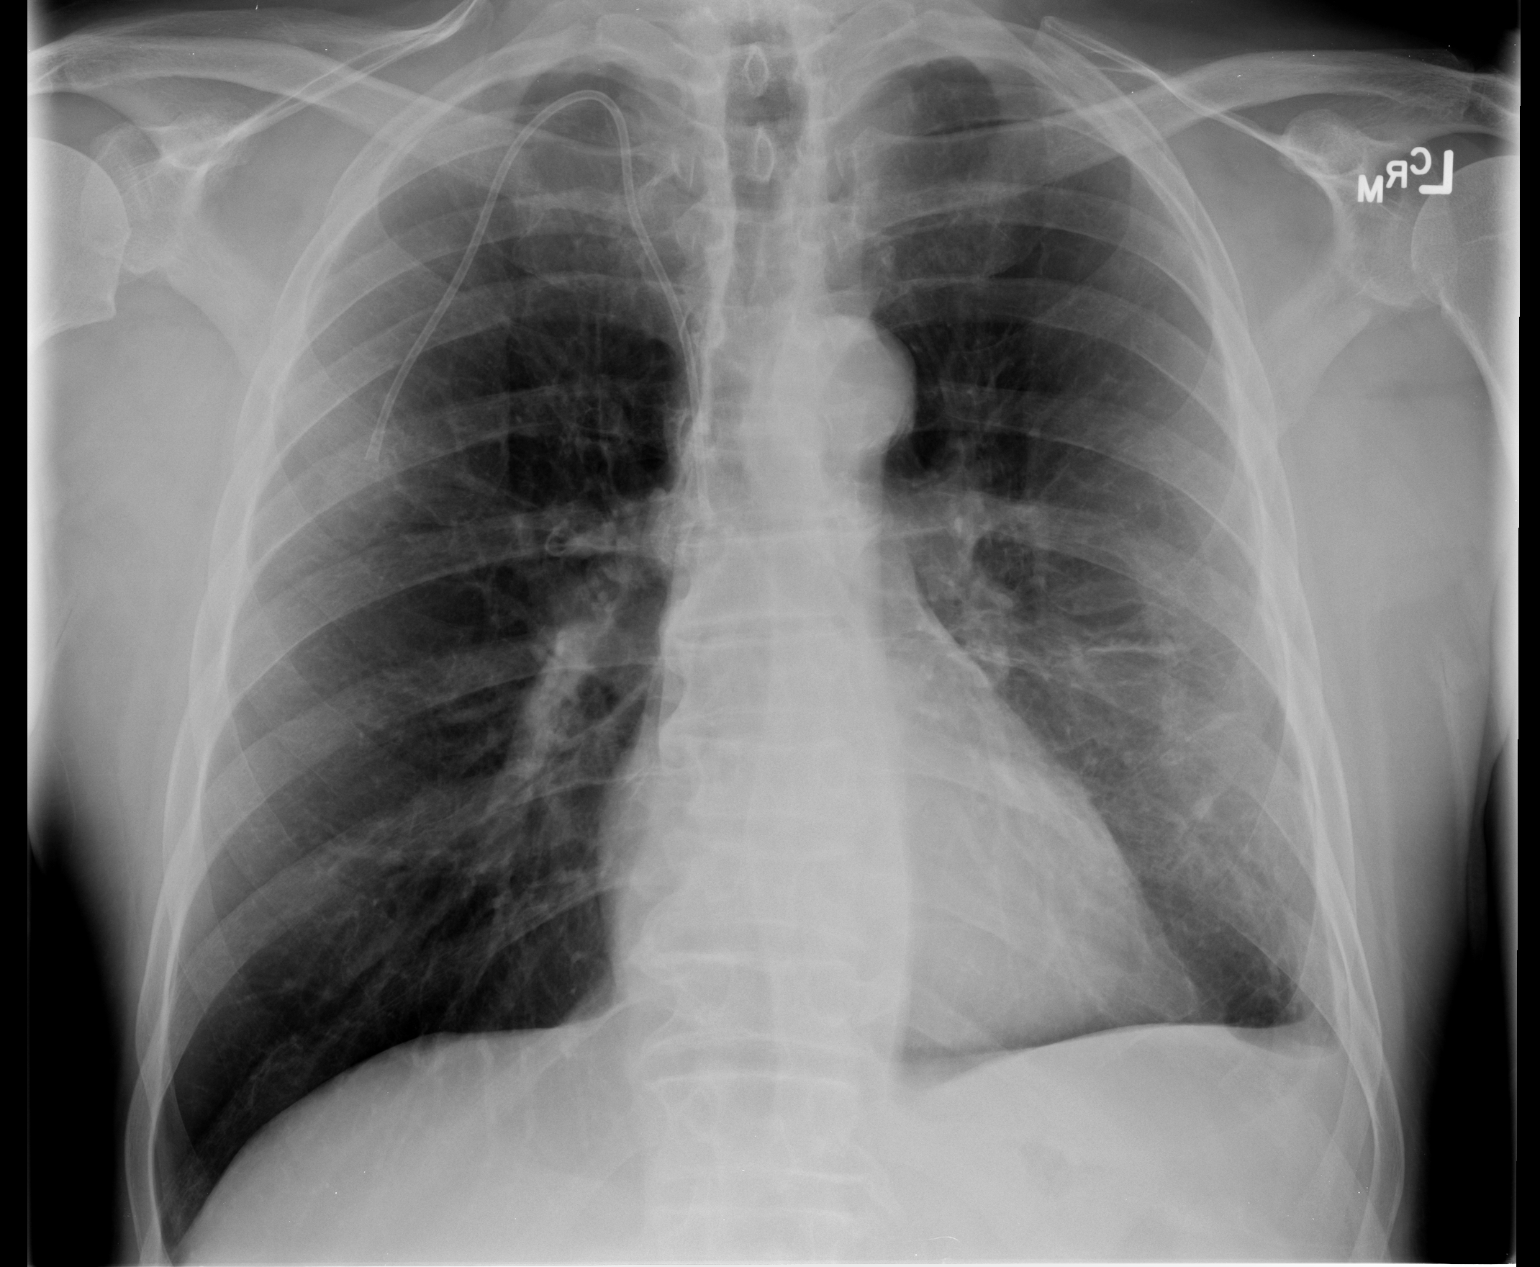

[view not recorded (2 of 2)]
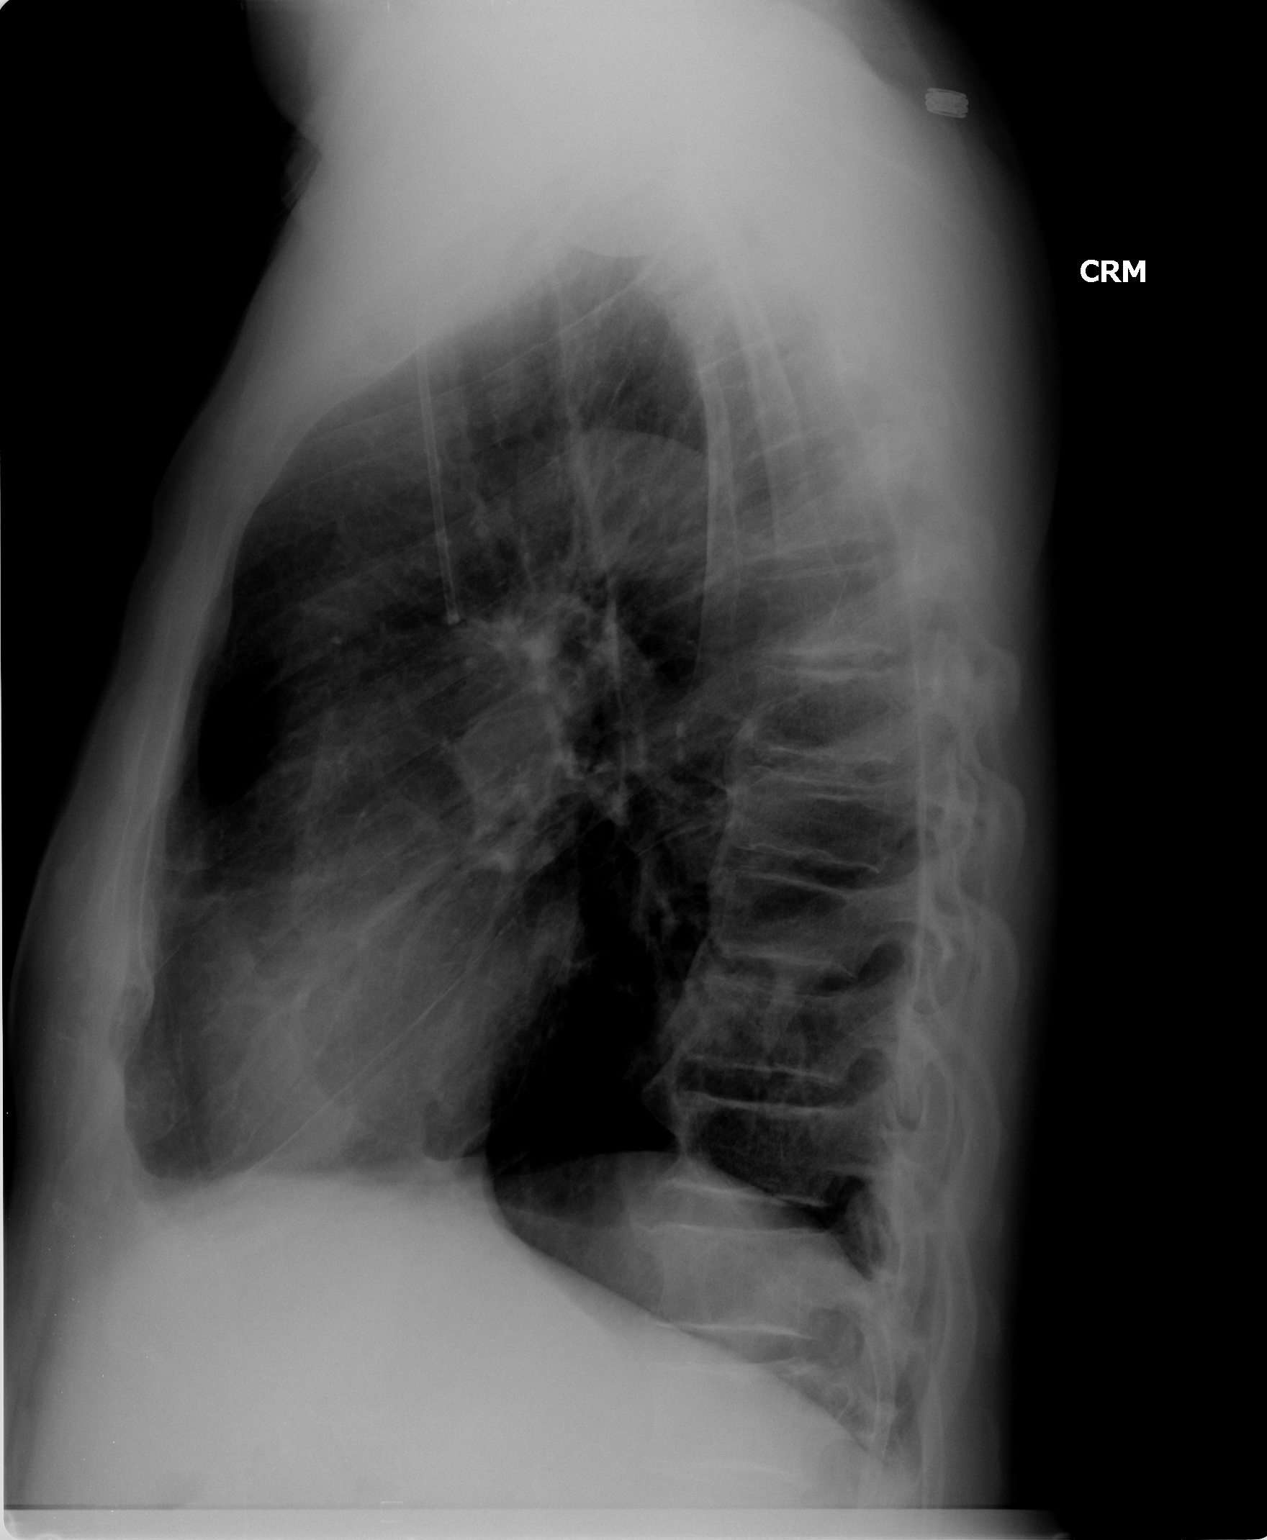

[2 of 2 positions shown; findings below may reference images not displayed]

FINDINGS: A right-sided Port-A-Cath terminates at the mid SVC.
Blunting left costophrenic angle on the frontal view is secondary
to pleural thickening. No pneumothorax.  Midline trachea.  Normal
heart size and mediastinal contours for age.  The right lower lobe
lung nodule is not identified/visualized by plain film.  Patchy
scarring in the left lung inferiorly and laterally.
IMPRESSION: 1. No acute cardiopulmonary disease.
2.  Left-sided pleural thickening and patchy scarring.

## 2009-11-09 IMAGING — CR DG CHEST 1V PORT
1 series · 1 of 1 positions shown · non-contrast
Comparison: [DATE]

CLINICAL DATA: Right lower lobe mass, postop.

PORTABLE CHEST - 1 VIEW

[AP]
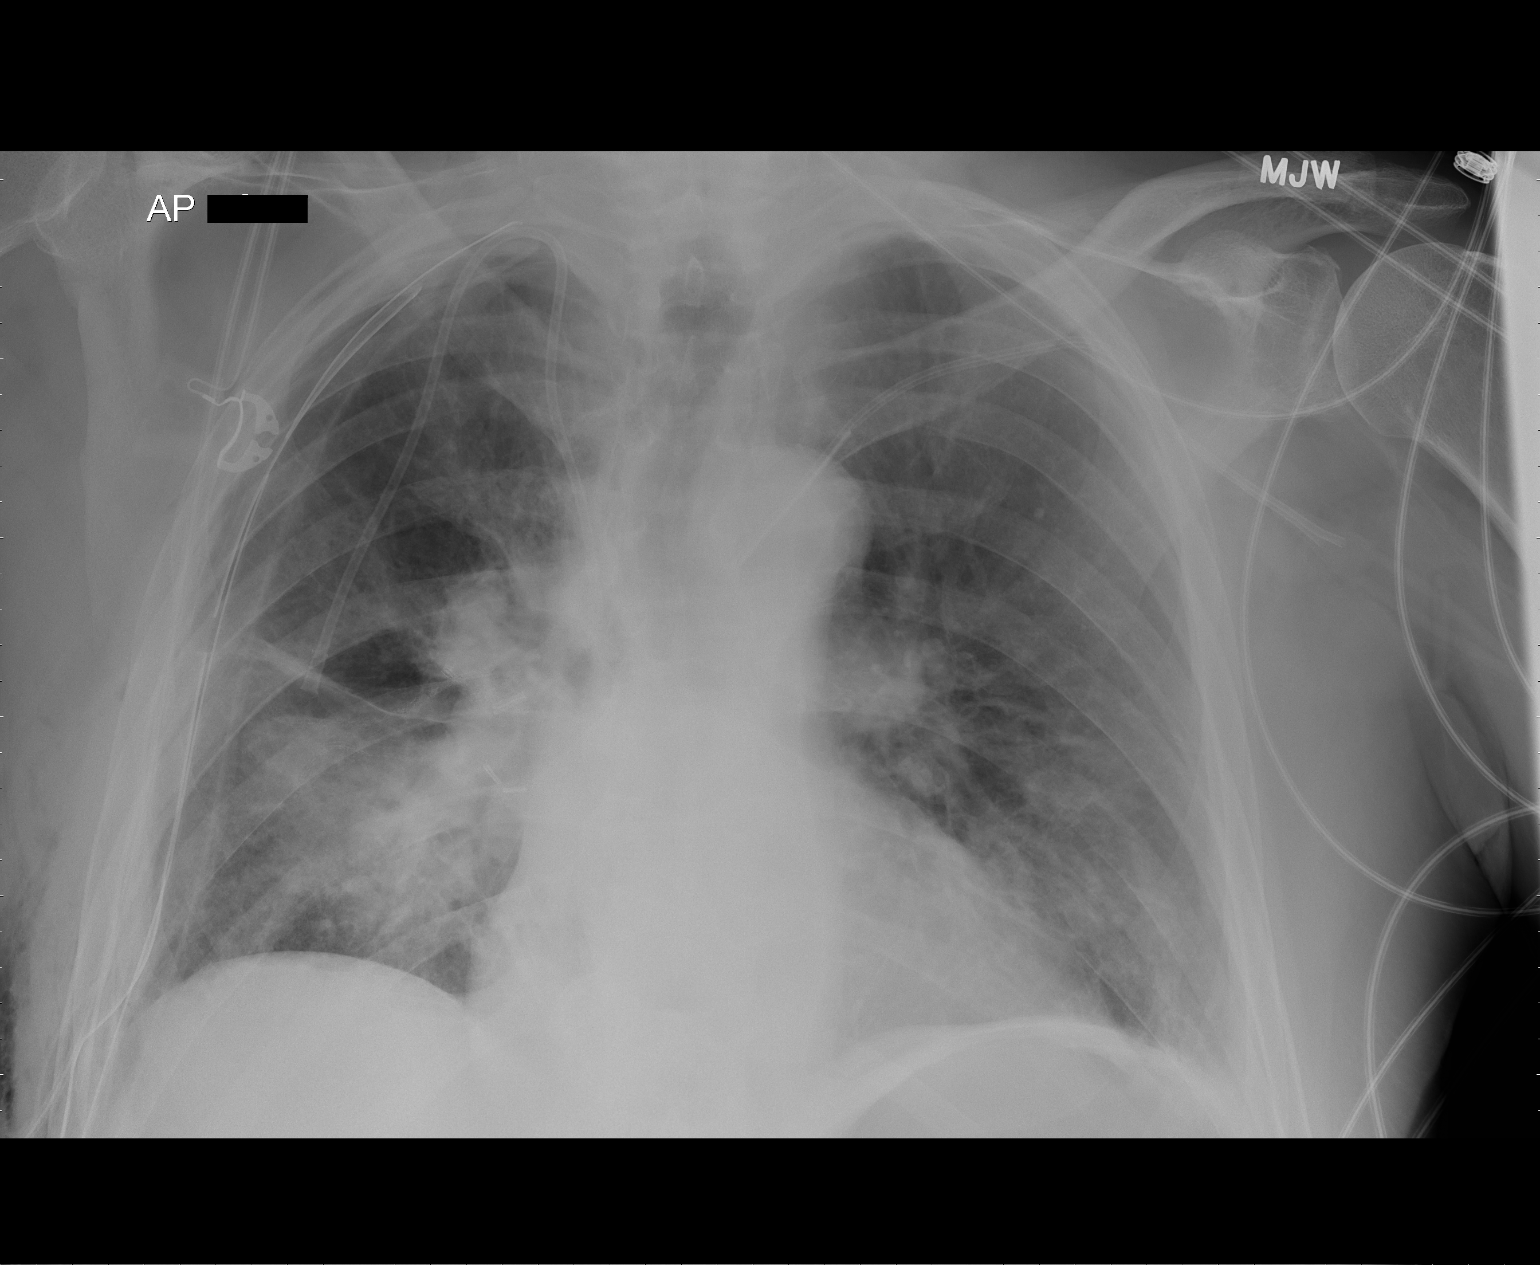

[1 of 1 positions shown; findings below may reference images not displayed]

FINDINGS: The right chest tubes remain in place, unchanged.  No
pneumothorax.  Right Port-A-Cath and left central line are
unchanged.  Slight increase in the right lower lobe airspace
opacity since prior study.  Minimal left base atelectasis.  Heart
is borderline in size.
IMPRESSION: No pneumothorax.

Increasing right lower lobe opacity.

## 2009-11-10 IMAGING — CR DG CHEST 1V PORT
1 series · 1 of 1 positions shown · non-contrast
Comparison: [DATE].

CLINICAL DATA: Right lower lobe mass.

PORTABLE CHEST - 1 VIEW

[AP]
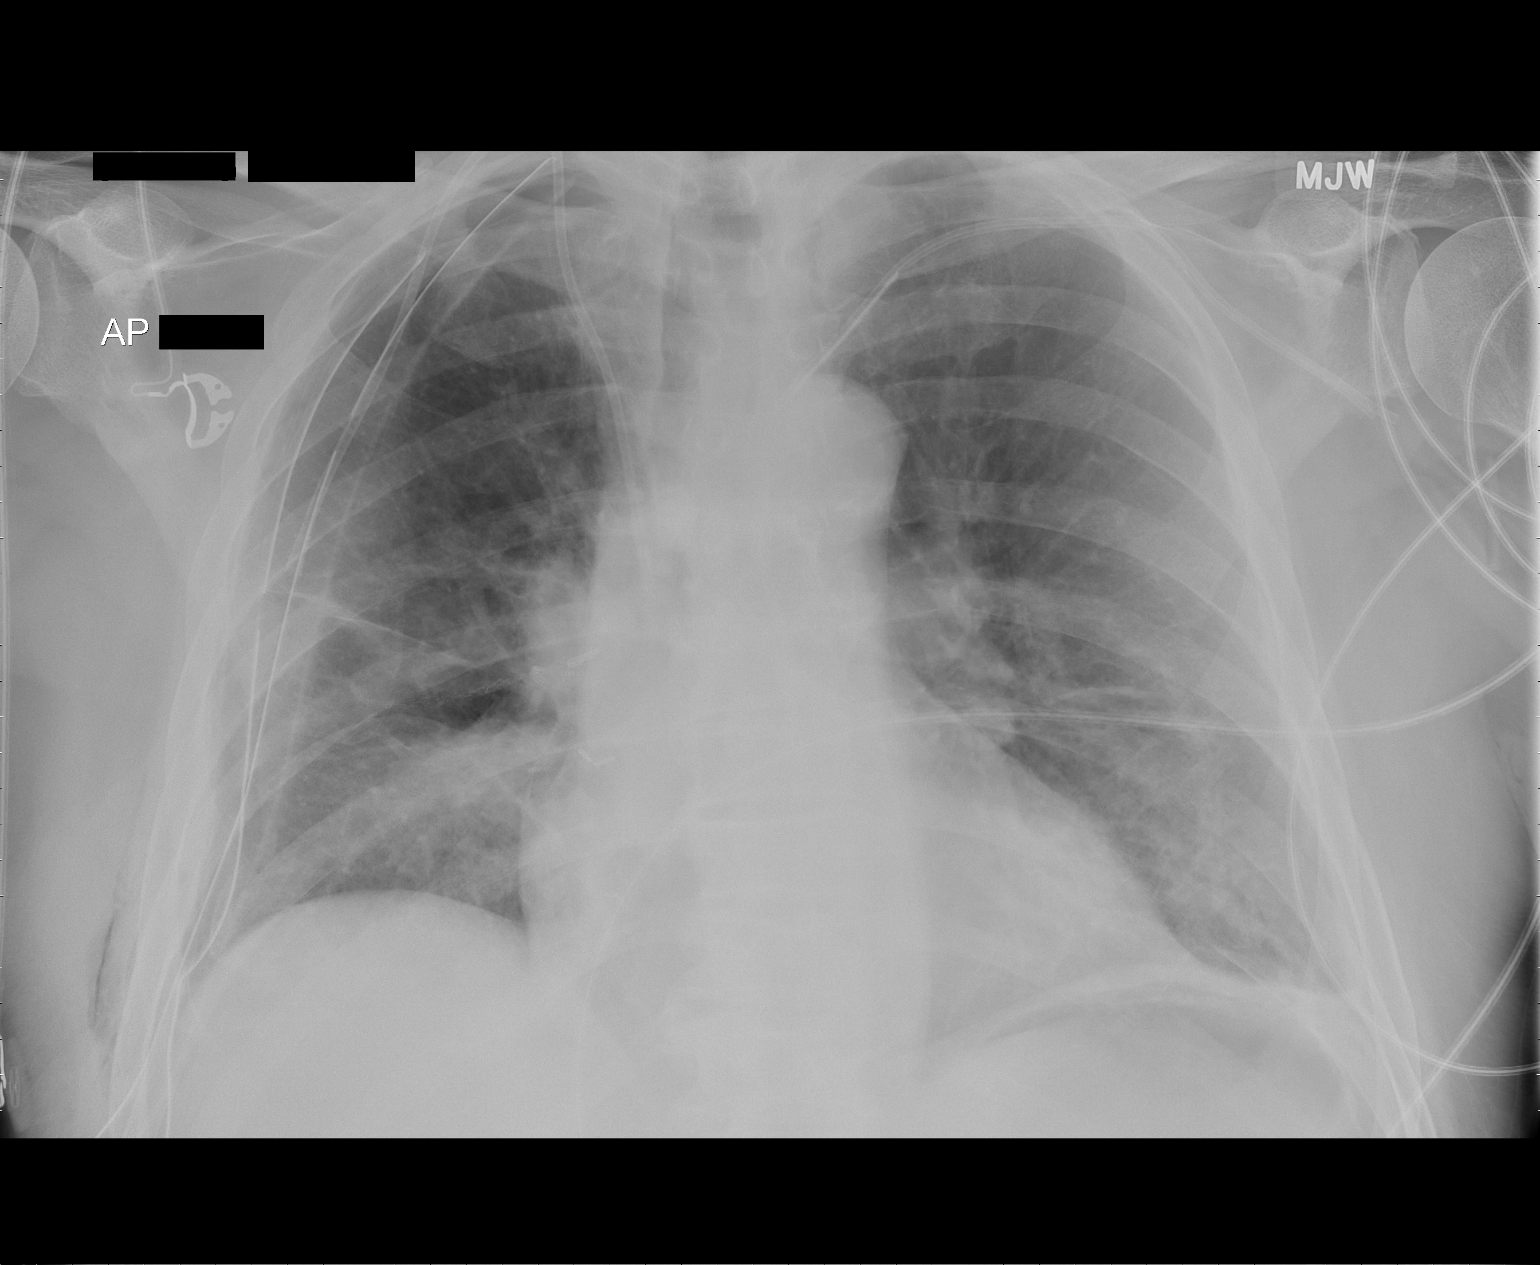

[1 of 1 positions shown; findings below may reference images not displayed]

FINDINGS: Two chest tubes are present on the right and unchanged.
Negative for pneumothorax or effusion.  Mild bibasilar atelectasis
has improved from the  prior study.

Left subclavian central venous catheter tip is in the left
innominate vein and unchanged.  Right-sided Port-A-Cath tip is in
the SVC.
IMPRESSION: Improvement in bibasilar atelectasis.  No pneumothorax.

## 2009-11-11 ENCOUNTER — Ambulatory Visit: Payer: Self-pay | Admitting: Hematology & Oncology

## 2009-11-11 IMAGING — CR DG CHEST 1V PORT
1 series · 1 of 1 positions shown · non-contrast
Comparison: Portable chest x-ray of [DATE]

CLINICAL DATA: The status post thoracoscopy for right lung base
mass

PORTABLE CHEST - 1 VIEW

[view not recorded]
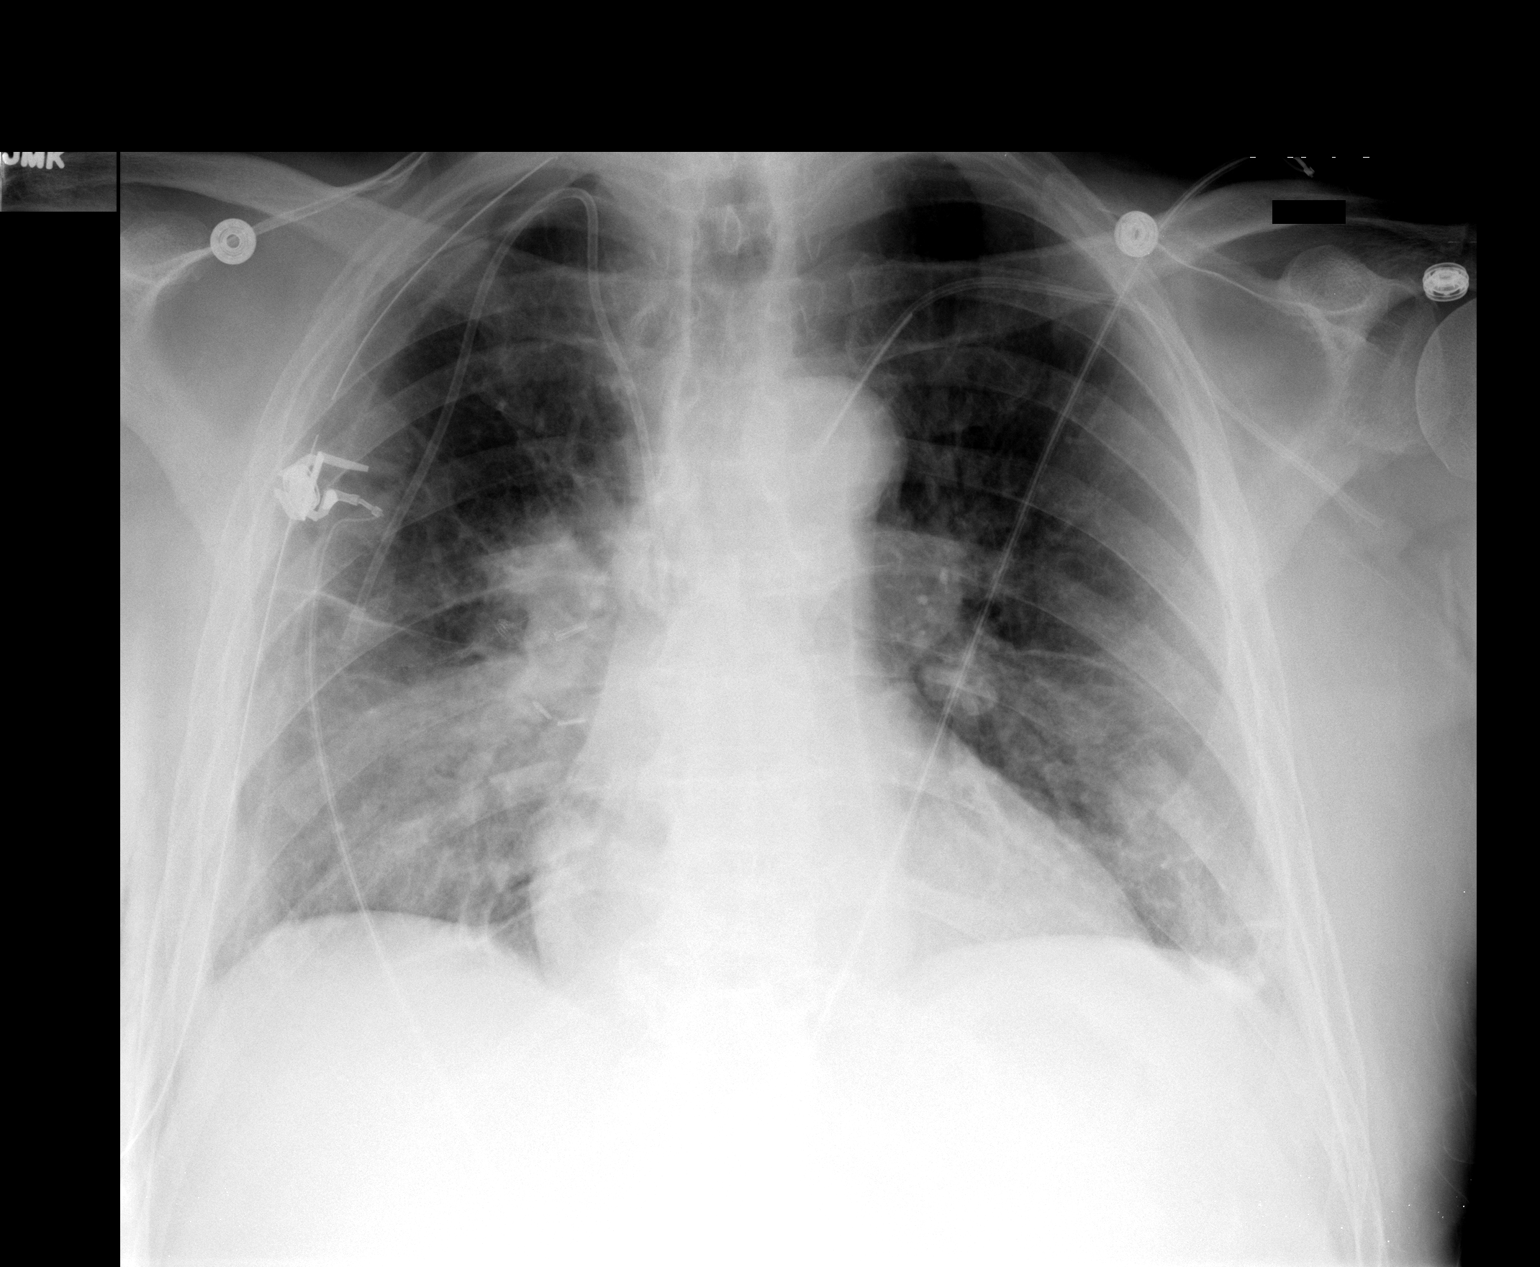

[1 of 1 positions shown; findings below may reference images not displayed]

FINDINGS: Vague opacity remains at the lung bases consistent with
atelectasis.  One of two right chest tube remains and no
pneumothorax is seen.  A right-sided Port-A-Cath and left-sided
central venous catheter are unchanged in position.  Cardiomegaly is
stable.
IMPRESSION: Little change in basilar atelectasis.  No pneumothorax.  One of two
right chest tube remains.

## 2009-11-12 IMAGING — CR DG CHEST 1V PORT
1 series · 1 of 1 positions shown · non-contrast
Comparison: [DATE]

CLINICAL DATA: Right lower lobe mass.

PORTABLE CHEST - 1 VIEW

[view not recorded]
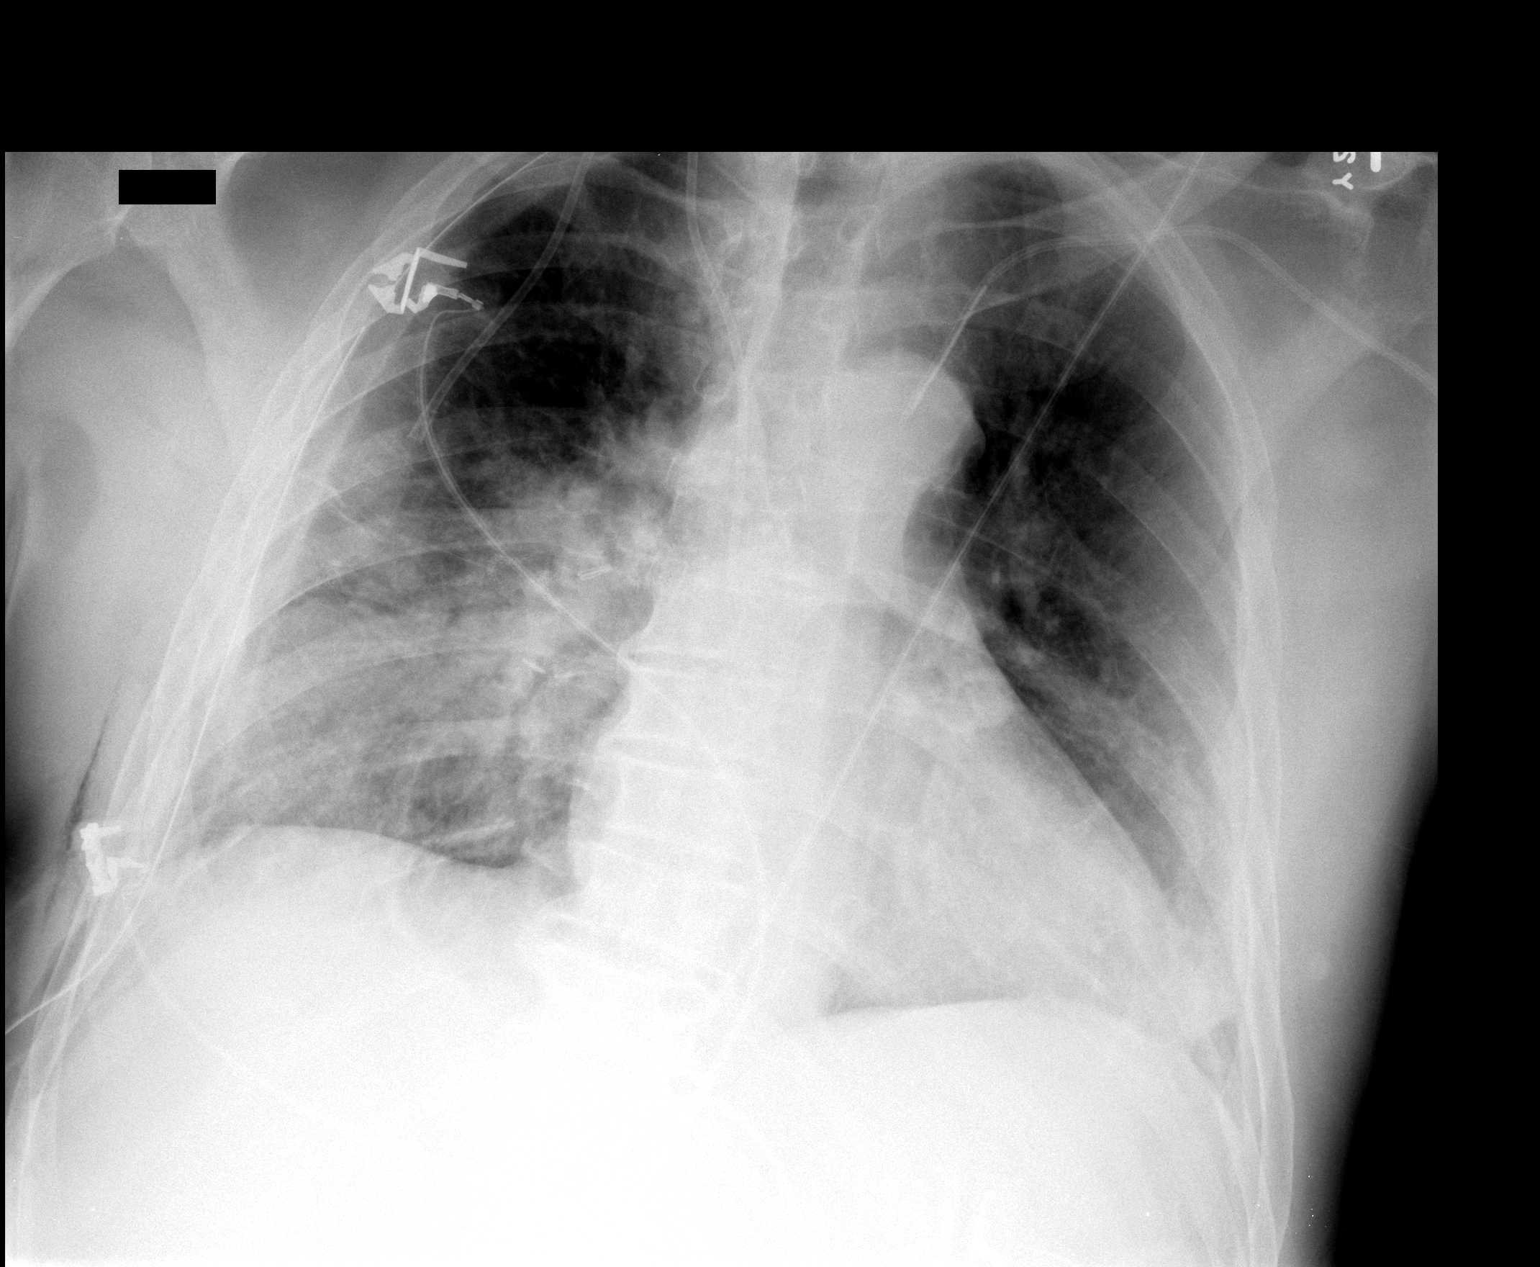

[1 of 1 positions shown; findings below may reference images not displayed]

FINDINGS: Postoperative changes on the right.  There is mild
cardiomegaly.  Bilateral lower lobe opacities, right greater than
left again noted, unchanged.  Right chest tube remains in place.
Small right apical pneumothorax.  Right Port-A-Cath and left
central line are unchanged.
IMPRESSION: Small right apical pneumothorax.  Otherwise no change.

## 2009-11-12 IMAGING — CR DG CHEST 1V PORT
1 series · 1 of 1 positions shown · non-contrast
Comparison: Earlier the same day

CLINICAL DATA: Chest tube removal

PORTABLE CHEST - 1 VIEW

[view not recorded]
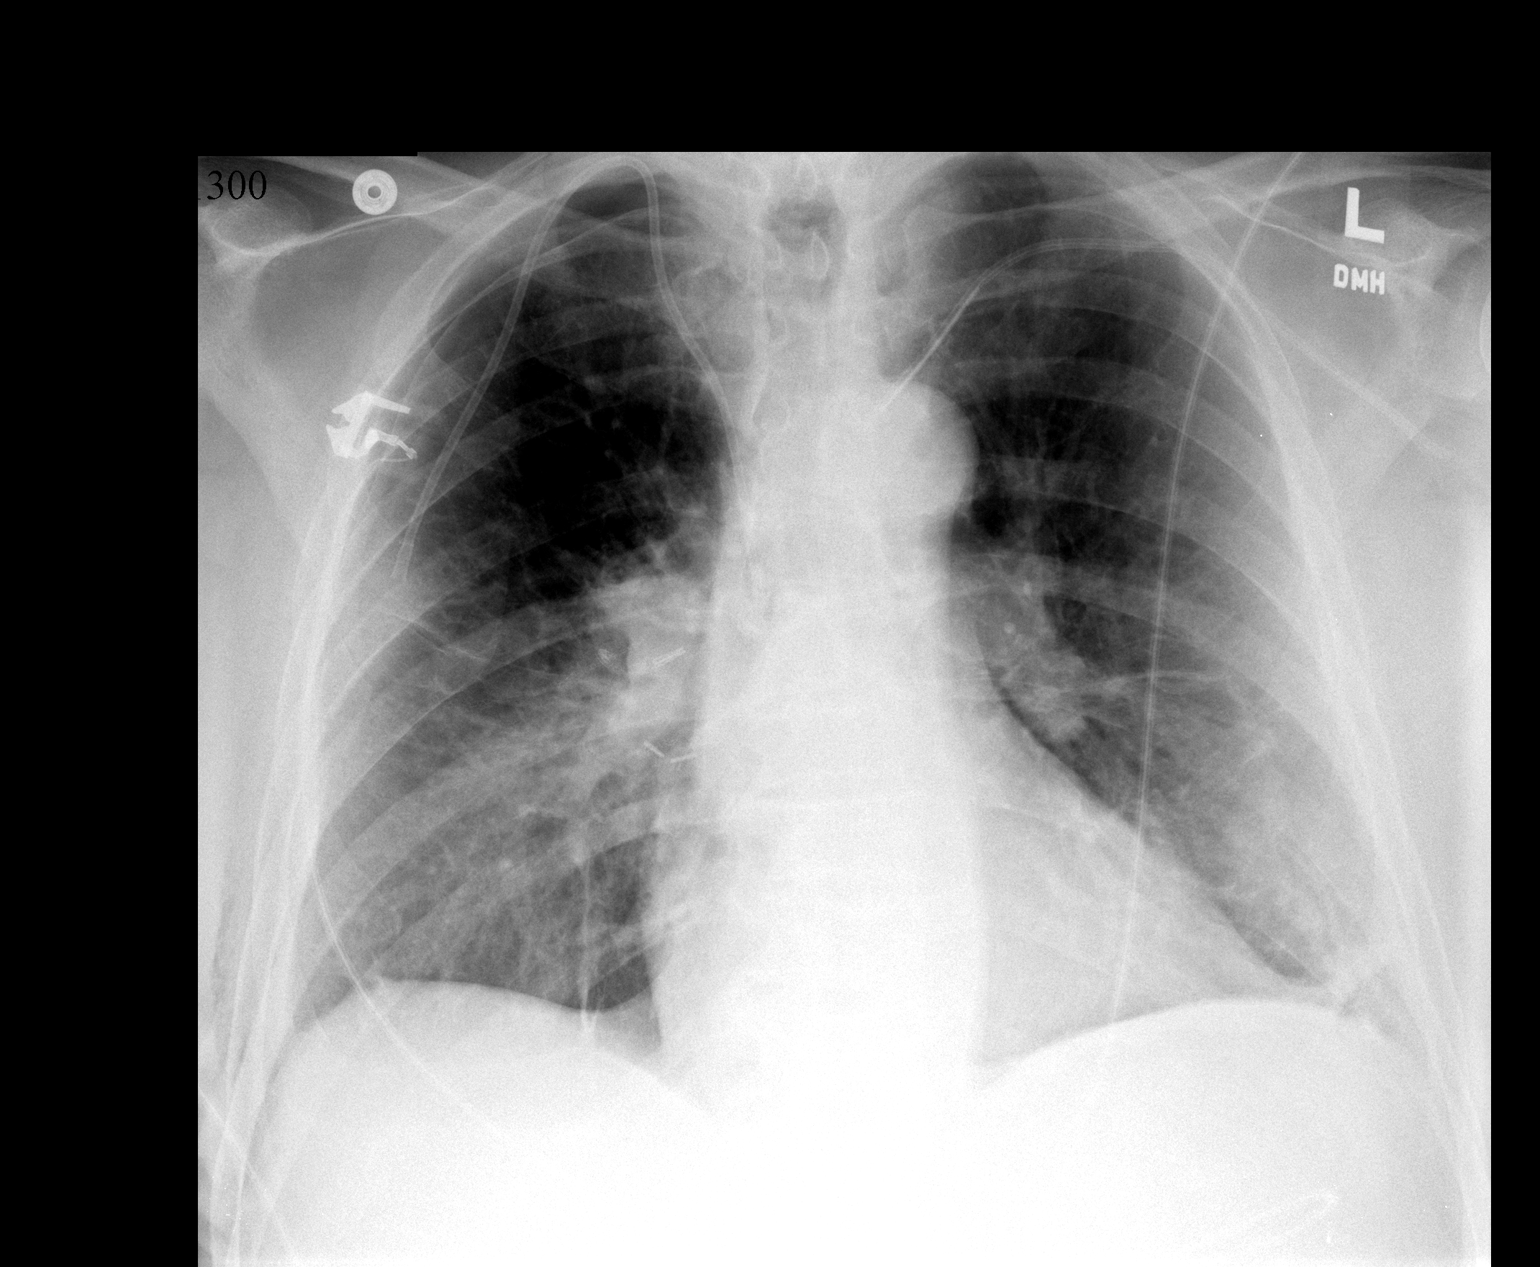

[1 of 1 positions shown; findings below may reference images not displayed]

FINDINGS: [JG] hours.  Right chest tube has been removed in the
interval.  Small apical right pneumothorax persists.  Lungs bases
are slightly better aerated than previously.  Heart size is stable.
Right Port-A-Cath in left subclavian central line remain in place.
The tip of the left subclavian central line projects over the
transverse aorta, presumably in the left innominate vein although
venous placement cannot be confirmed on this film.
IMPRESSION: Small right apical pneumothorax persists after right chest tube
removal.

## 2009-11-13 IMAGING — CR DG CHEST 2V
2 series · 2 of 2 positions shown · non-contrast
Comparison: [DATE]

CLINICAL DATA: Right lung mass, status post resection, right
pneumothorax

CHEST - 2 VIEW

[w chest pa]
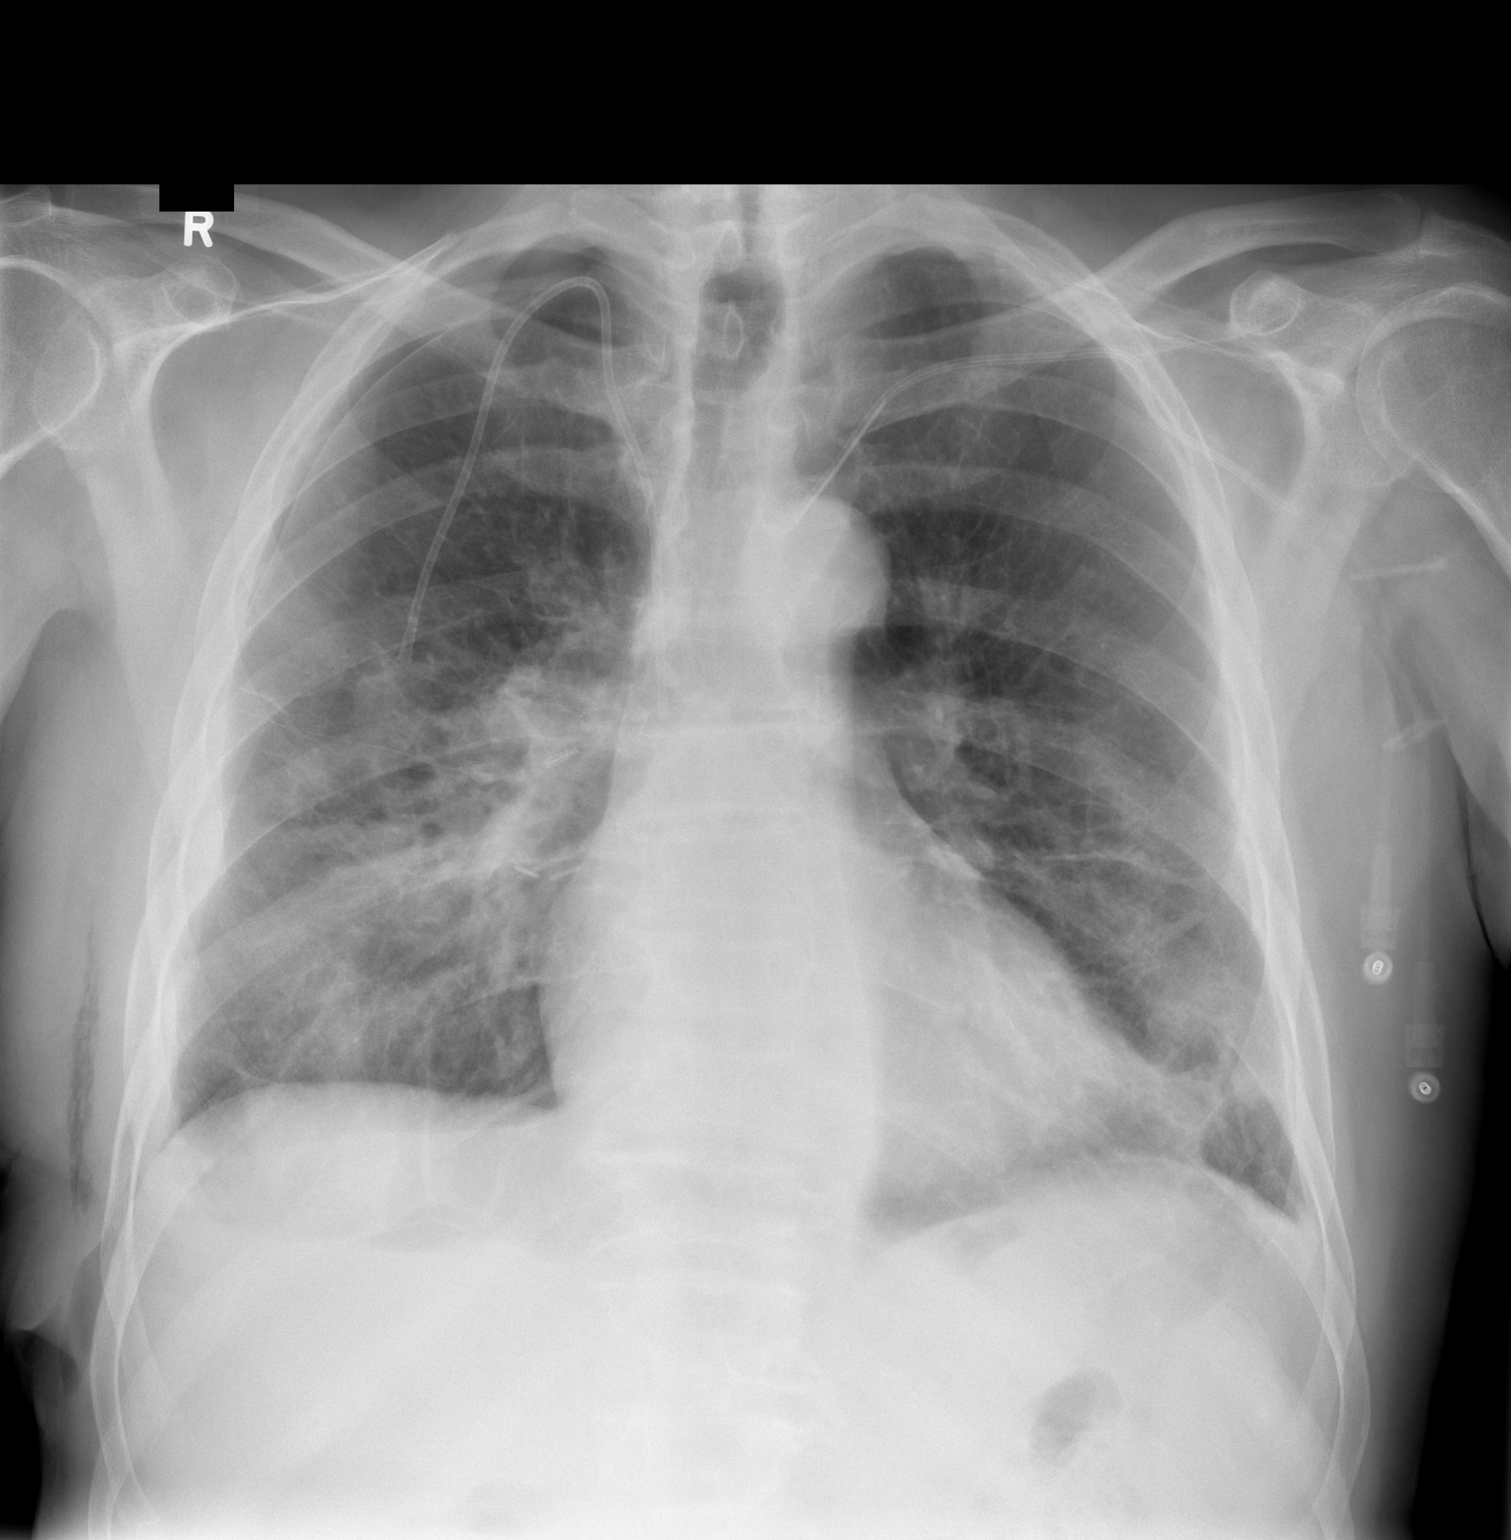

[w chest lat]
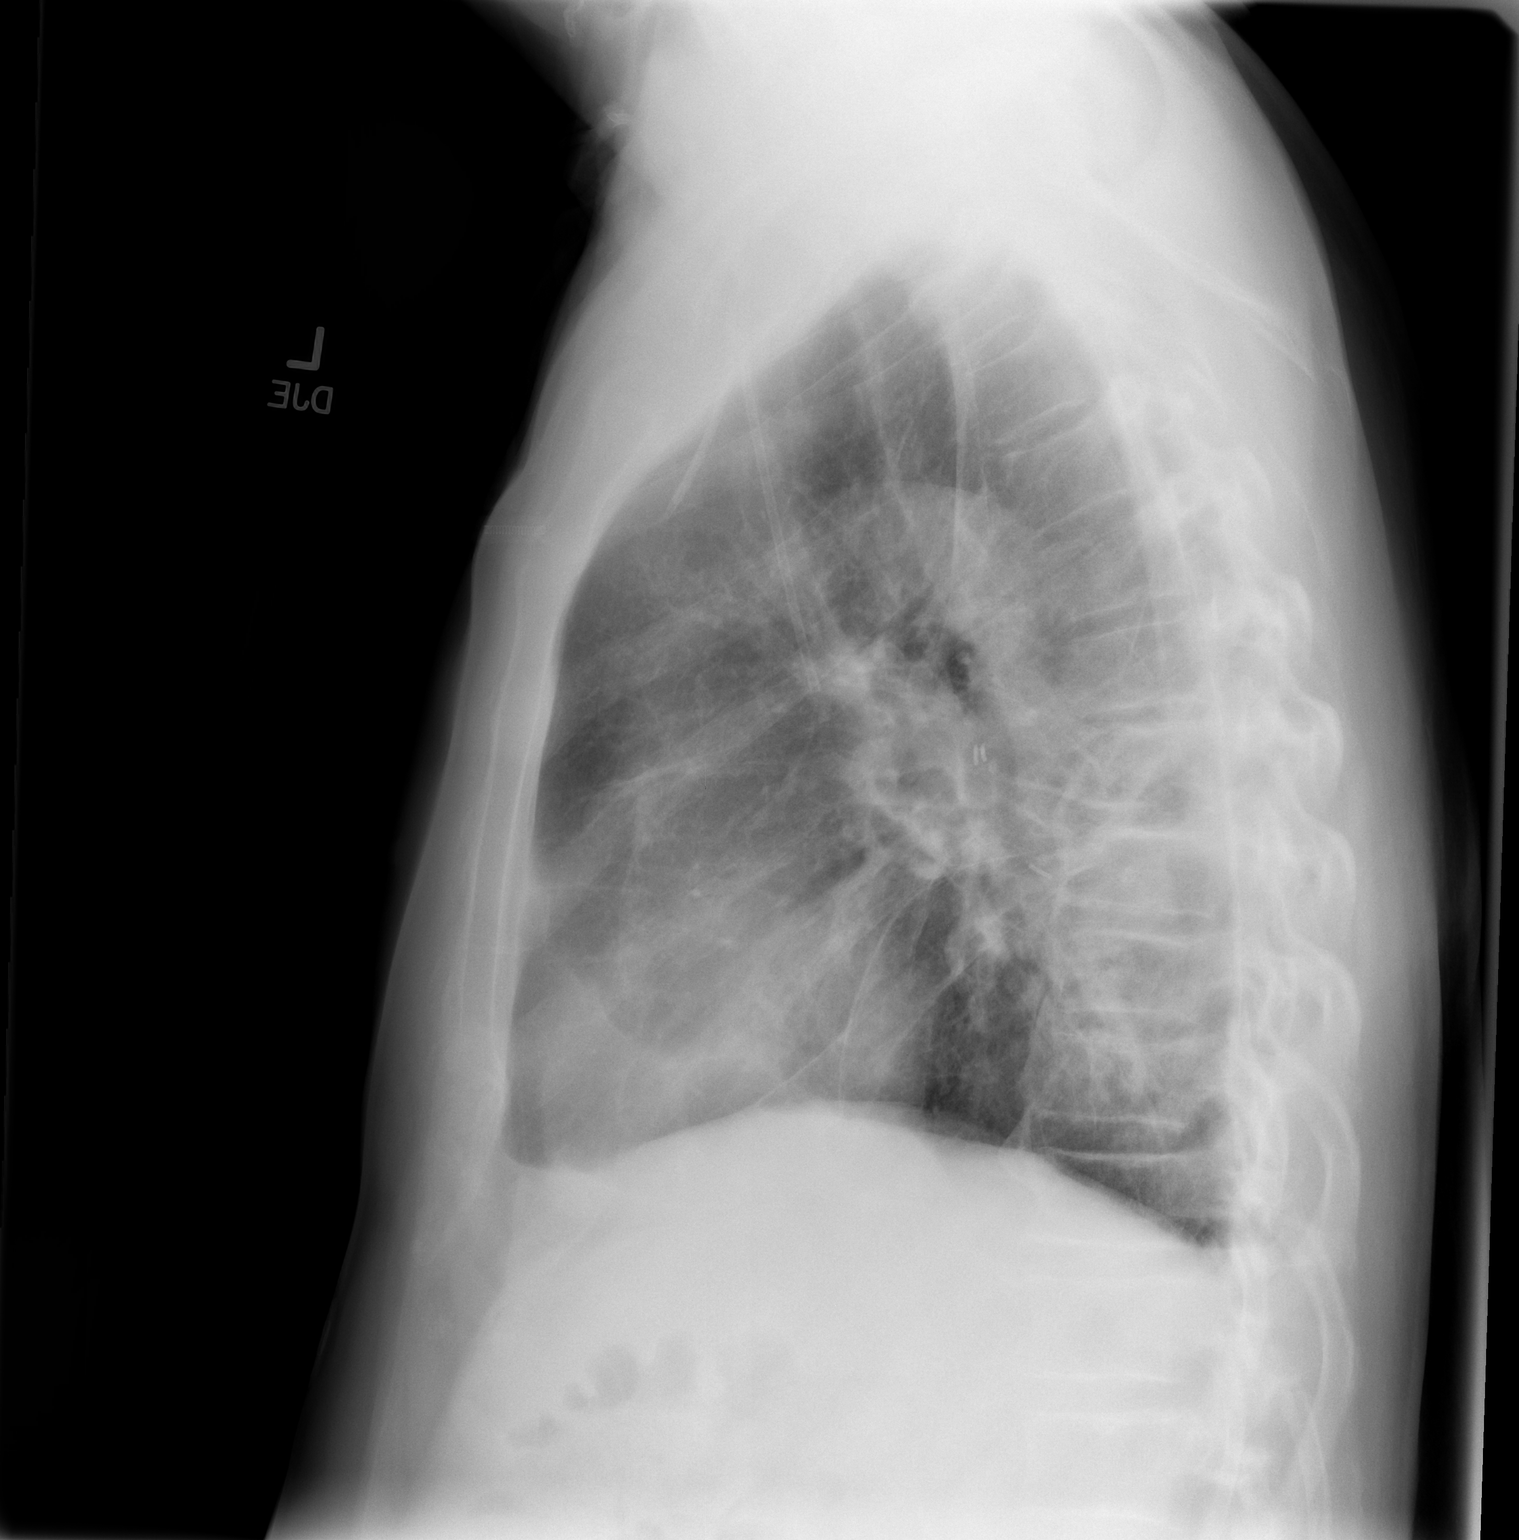

[2 of 2 positions shown; findings below may reference images not displayed]

FINDINGS: Stable small residual right apical pneumothorax.  No
significant interval enlargement.  Patchy basilar airspace disease
versus atelectasis persist.  Postop changes in the right hilum.
Subcutaneous air on the right chest.  No significant interval
change.
IMPRESSION: Stable right apical residual pneumothorax.
Persistent mild basilar atelectasis and airspace disease.

## 2009-11-17 ENCOUNTER — Encounter: Admission: RE | Admit: 2009-11-17 | Discharge: 2009-11-17 | Payer: Self-pay | Admitting: Thoracic Surgery

## 2009-11-17 ENCOUNTER — Ambulatory Visit: Payer: Self-pay | Admitting: Thoracic Surgery

## 2009-12-08 ENCOUNTER — Encounter: Admission: RE | Admit: 2009-12-08 | Discharge: 2009-12-08 | Payer: Self-pay | Admitting: Thoracic Surgery

## 2009-12-08 ENCOUNTER — Ambulatory Visit: Payer: Self-pay | Admitting: Thoracic Surgery

## 2009-12-08 IMAGING — CR DG CHEST 2V
2 series · 2 of 2 positions shown · non-contrast
Comparison: [DATE] and earlier

CLINICAL DATA: Short of breath/history of lung cancer

CHEST - 2 VIEW

[view not recorded (1 of 2)]
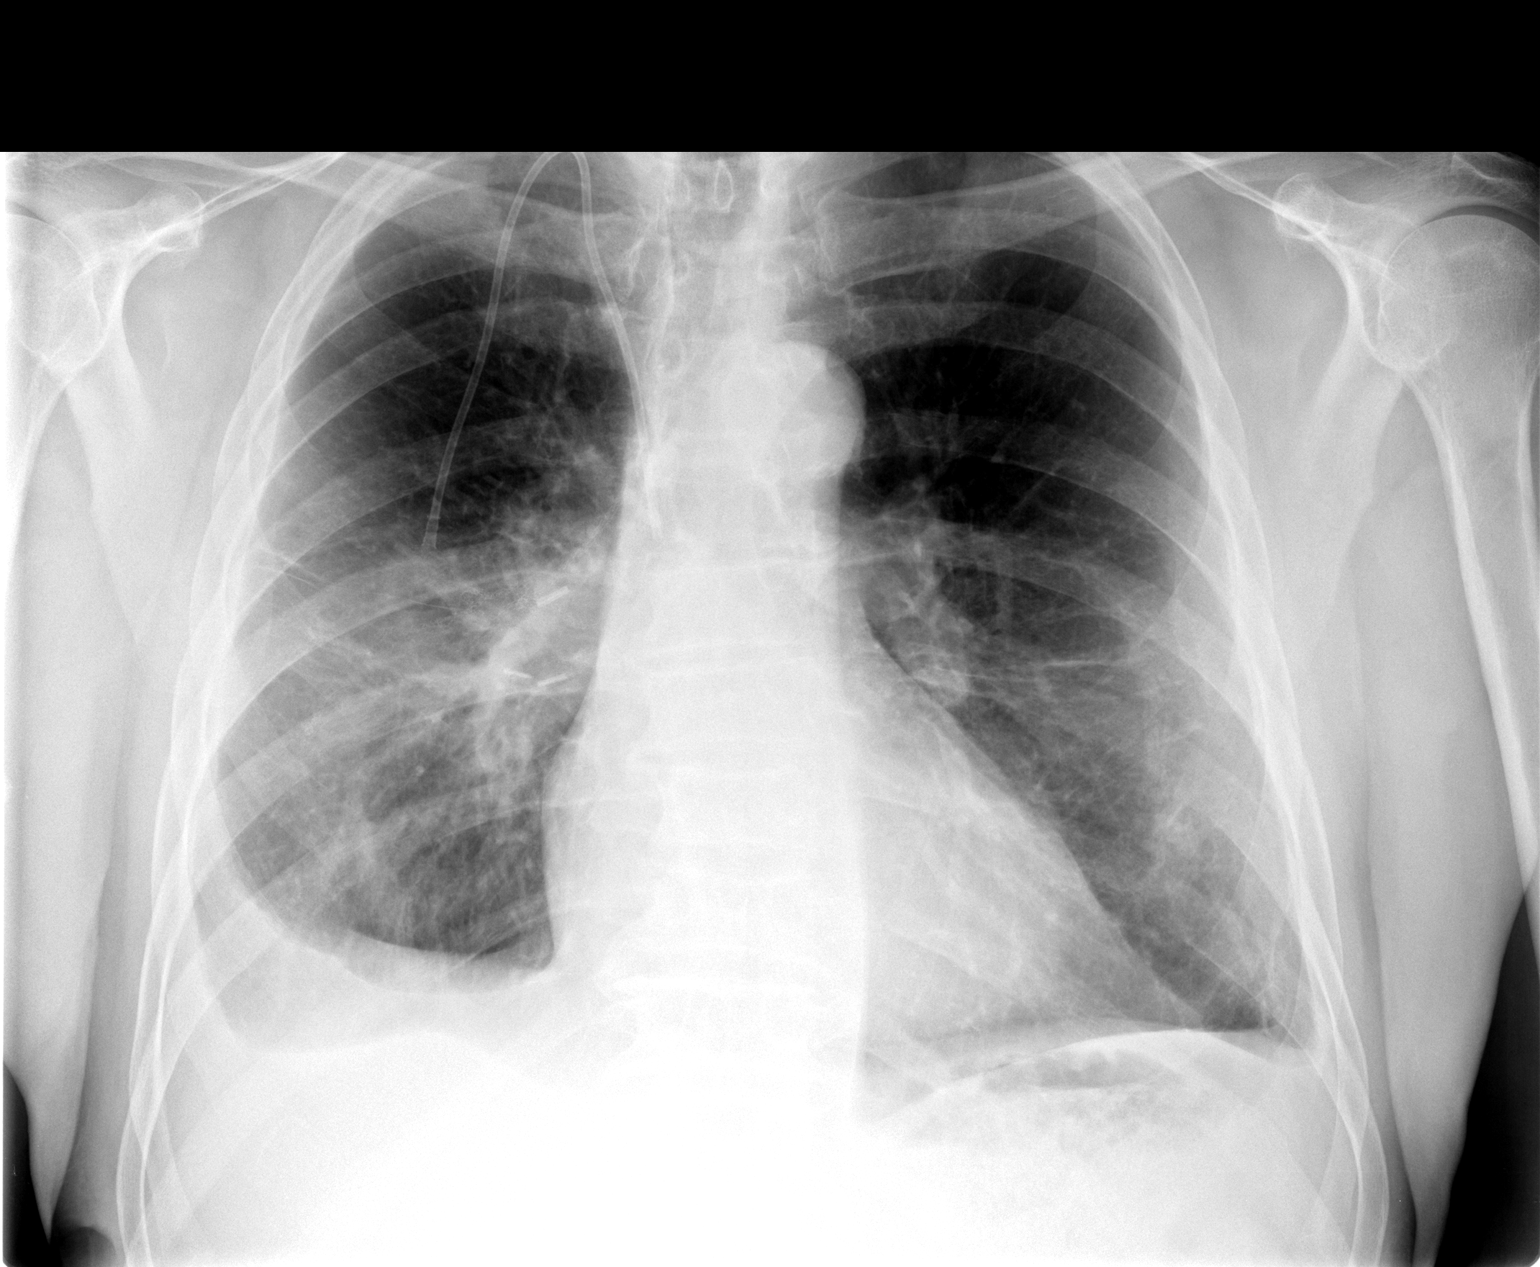

[view not recorded (2 of 2)]
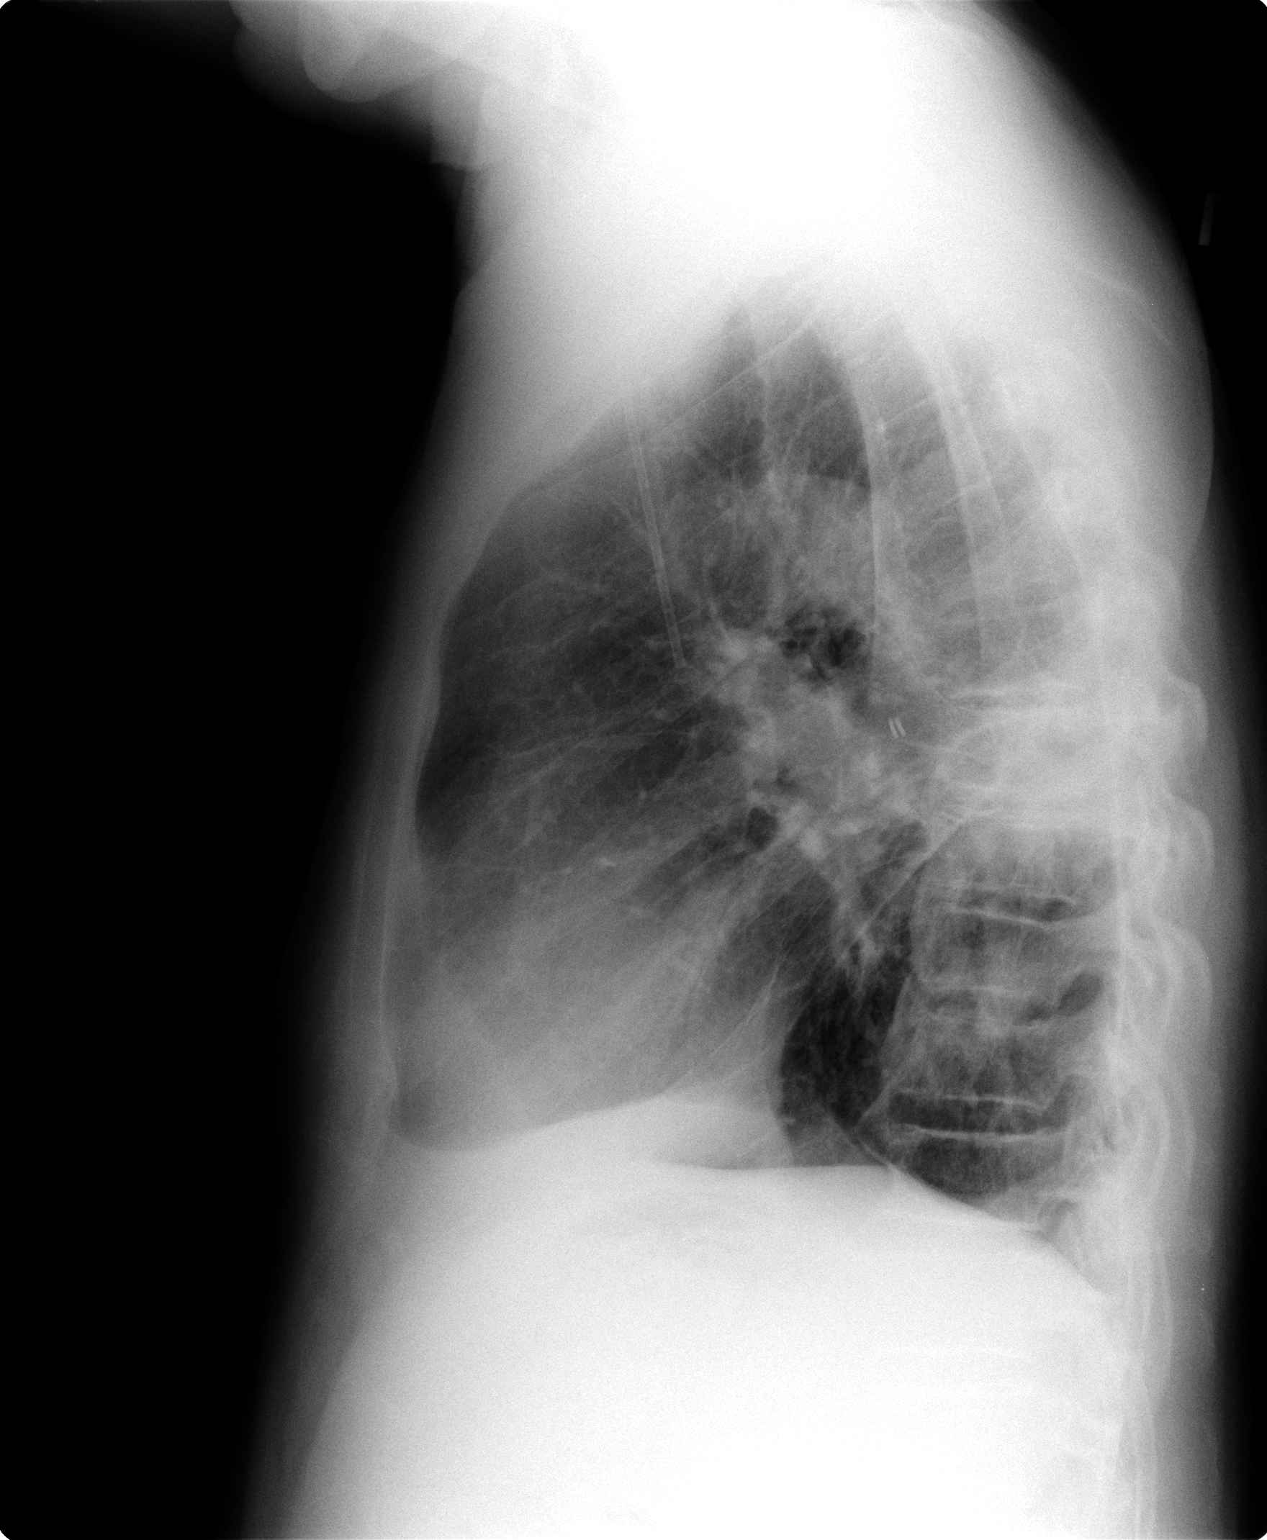

[2 of 2 positions shown; findings below may reference images not displayed]

FINDINGS: No definite pneumothorax on the right as was thought to
be present previously.  Right effusion appears slightly larger.
Right lower lobe atelectasis about the same.  Port-A-Cath remains
in place.
IMPRESSION: 1.  No definite pneumothorax.
2.  Right pleural effusion larger.
3.  Right lower lobe airspace density about the same.

## 2010-01-12 ENCOUNTER — Ambulatory Visit: Payer: Self-pay | Admitting: Thoracic Surgery

## 2010-01-12 ENCOUNTER — Encounter: Admission: RE | Admit: 2010-01-12 | Discharge: 2010-01-12 | Payer: Self-pay | Admitting: Thoracic Surgery

## 2010-01-12 IMAGING — CR DG CHEST 2V
2 series · 2 of 2 positions shown · non-contrast
Comparison: [DATE]

CLINICAL DATA: Right lung surgery.  Smoker.

CHEST - 2 VIEW

[w chest pa]
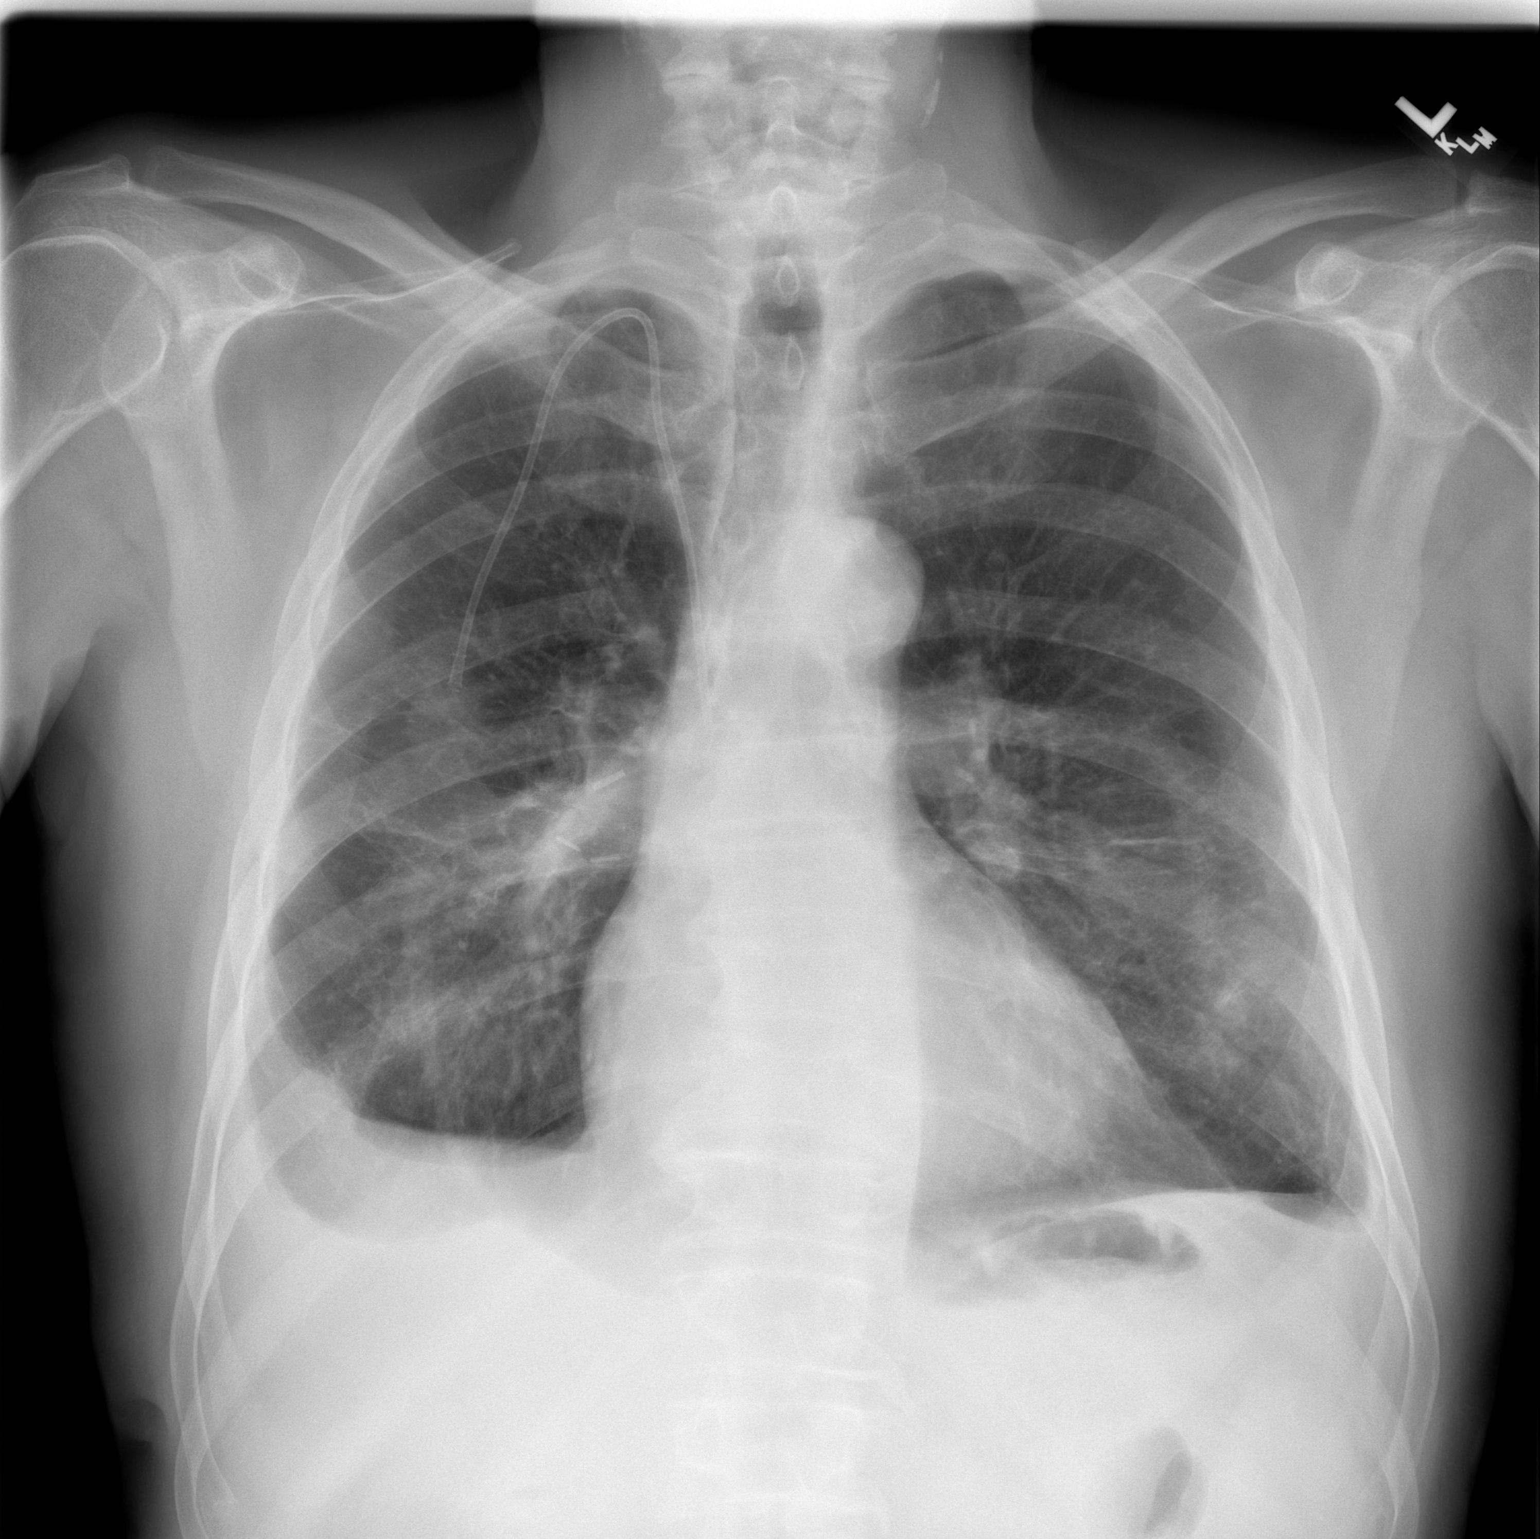

[w chest lat]
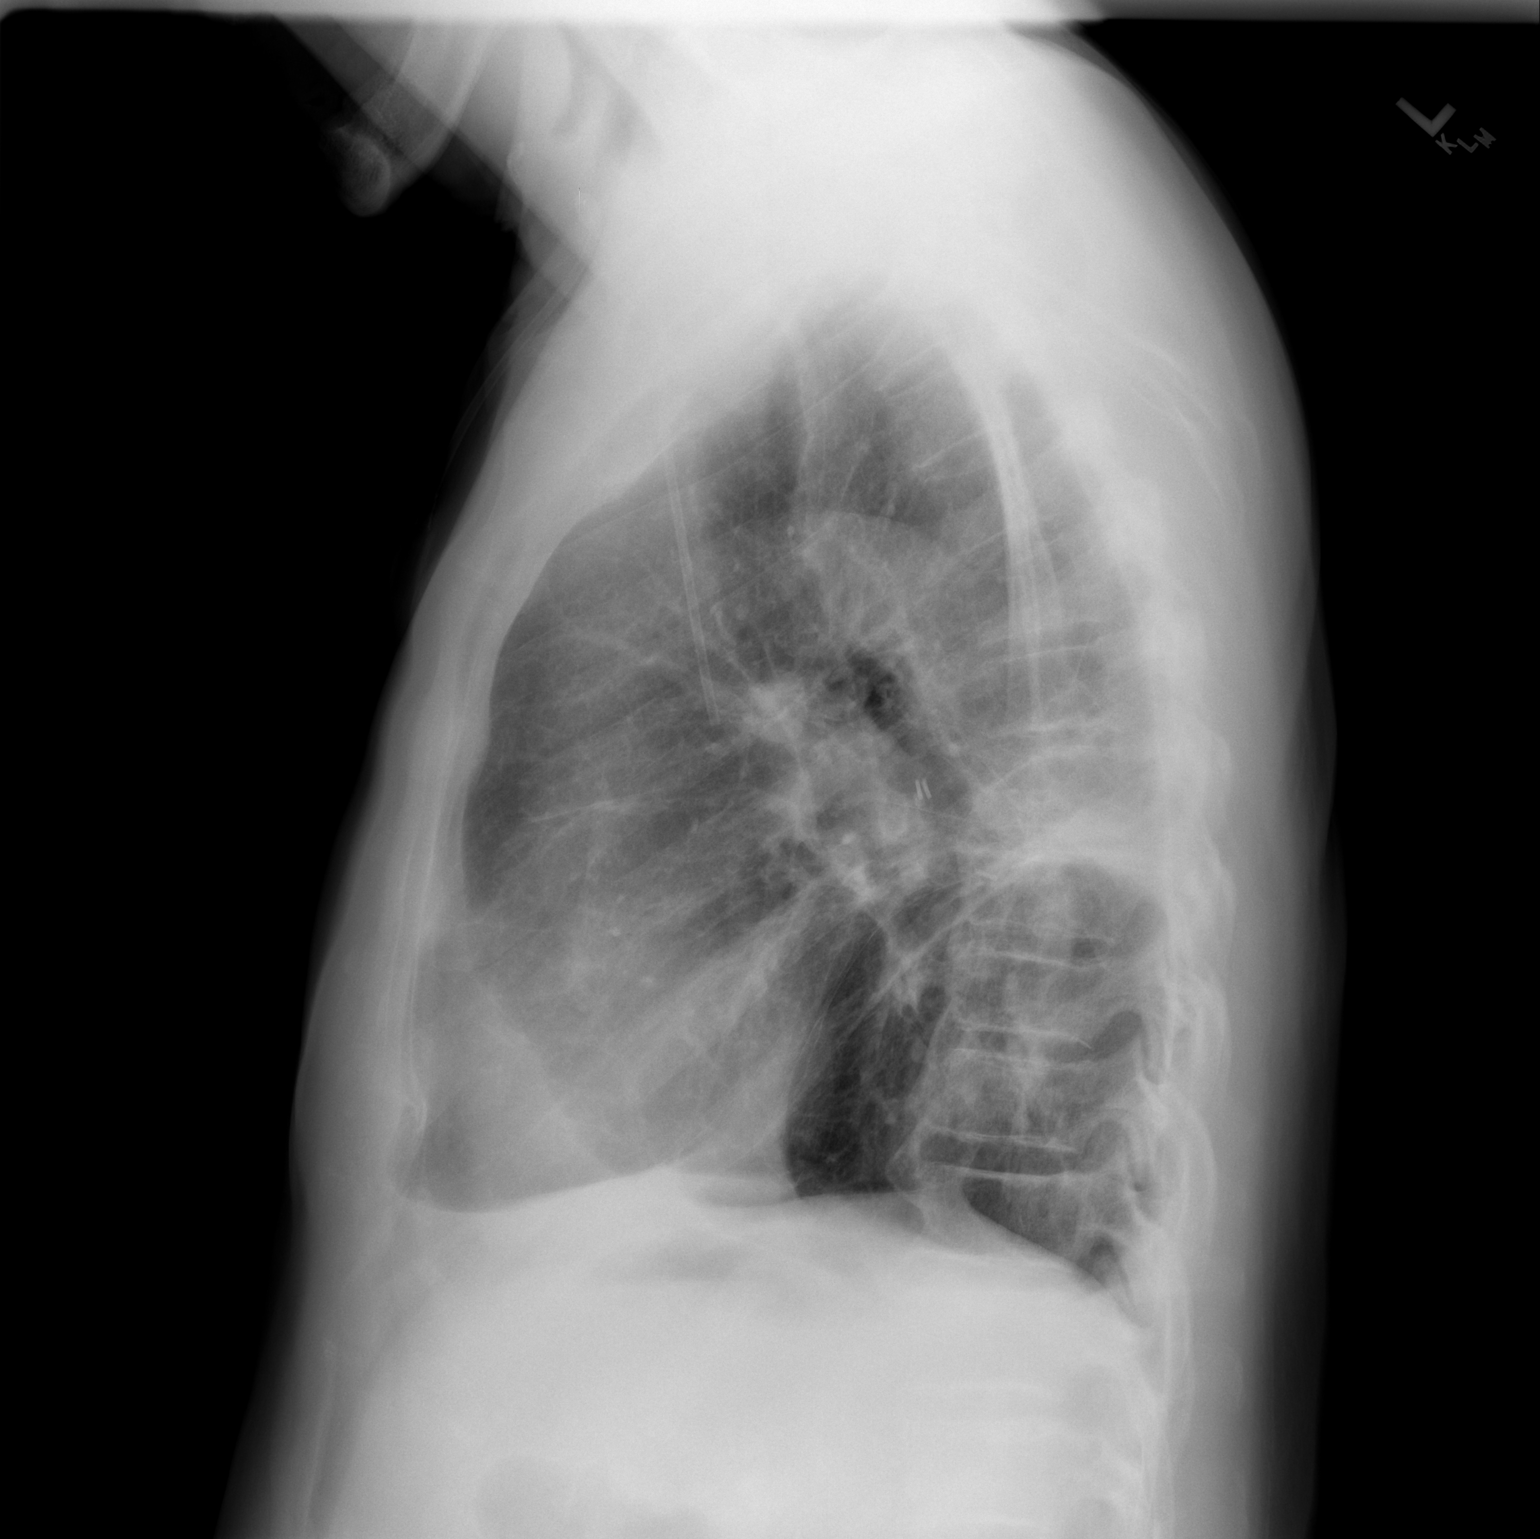

[2 of 2 positions shown; findings below may reference images not displayed]

FINDINGS: Trachea is midline.  Heart size normal.  There are
postoperative changes and volume loss in the right hemithorax with
bilateral pleural fluid and/or thickening, right greater than left.
Airspace opacities in the mid and lower lung zones are stable.
Right IJ Port-A-Cath tip projects over the SVC.
IMPRESSION: 1.  Stable postoperative changes in the right hemithorax.
2.  Pleural fluid and/or thickening at the costophrenic angles
bilaterally, right greater than left.
3.  Scattered mid and lower lung zone parenchymal opacities are
stable.

## 2010-02-01 ENCOUNTER — Ambulatory Visit: Payer: Self-pay | Admitting: Hematology & Oncology

## 2010-02-02 LAB — COMPREHENSIVE METABOLIC PANEL
ALT: 10 U/L (ref 0–53)
AST: 15 U/L (ref 0–37)
Albumin: 4 g/dL (ref 3.5–5.2)
Alkaline Phosphatase: 142 U/L — ABNORMAL HIGH (ref 39–117)
BUN: 9 mg/dL (ref 6–23)
CO2: 24 mEq/L (ref 19–32)
Calcium: 8.9 mg/dL (ref 8.4–10.5)
Chloride: 106 mEq/L (ref 96–112)
Creatinine, Ser: 0.76 mg/dL (ref 0.40–1.50)
Glucose, Bld: 101 mg/dL — ABNORMAL HIGH (ref 70–99)
Potassium: 3.8 mEq/L (ref 3.5–5.3)
Sodium: 140 mEq/L (ref 135–145)
Total Bilirubin: 0.7 mg/dL (ref 0.3–1.2)
Total Protein: 6.9 g/dL (ref 6.0–8.3)

## 2010-02-02 LAB — CBC WITH DIFFERENTIAL (CANCER CENTER ONLY)
BASO#: 0 10*3/uL (ref 0.0–0.2)
BASO%: 0.4 % (ref 0.0–2.0)
EOS%: 4.2 % (ref 0.0–7.0)
Eosinophils Absolute: 0.2 10*3/uL (ref 0.0–0.5)
HCT: 41.9 % (ref 38.7–49.9)
HGB: 14.4 g/dL (ref 13.0–17.1)
LYMPH#: 1.3 10*3/uL (ref 0.9–3.3)
LYMPH%: 30.4 % (ref 14.0–48.0)
MCH: 32.2 pg (ref 28.0–33.4)
MCHC: 34.4 g/dL (ref 32.0–35.9)
MCV: 93 fL (ref 82–98)
MONO#: 0.3 10*3/uL (ref 0.1–0.9)
MONO%: 7.4 % (ref 0.0–13.0)
NEUT#: 2.4 10*3/uL (ref 1.5–6.5)
NEUT%: 57.6 % (ref 40.0–80.0)
Platelets: 116 10*3/uL — ABNORMAL LOW (ref 145–400)
RBC: 4.49 10*6/uL (ref 4.20–5.70)
RDW: 12.5 % (ref 10.5–14.6)
WBC: 4.1 10*3/uL (ref 4.0–10.0)

## 2010-02-02 LAB — CEA: CEA: 0.5 ng/mL (ref 0.0–5.0)

## 2010-03-23 ENCOUNTER — Encounter: Admission: RE | Admit: 2010-03-23 | Discharge: 2010-03-23 | Payer: Self-pay | Admitting: Thoracic Surgery

## 2010-03-23 ENCOUNTER — Ambulatory Visit: Payer: Self-pay | Admitting: Thoracic Surgery

## 2010-03-23 IMAGING — CR DG CHEST 2V
2 series · 2 of 2 positions shown · non-contrast
Comparison: [DATE]

CLINICAL DATA: Lung carcinoma.  Postop from right VATS procedure.

CHEST - 2 VIEW

[view not recorded (1 of 2)]
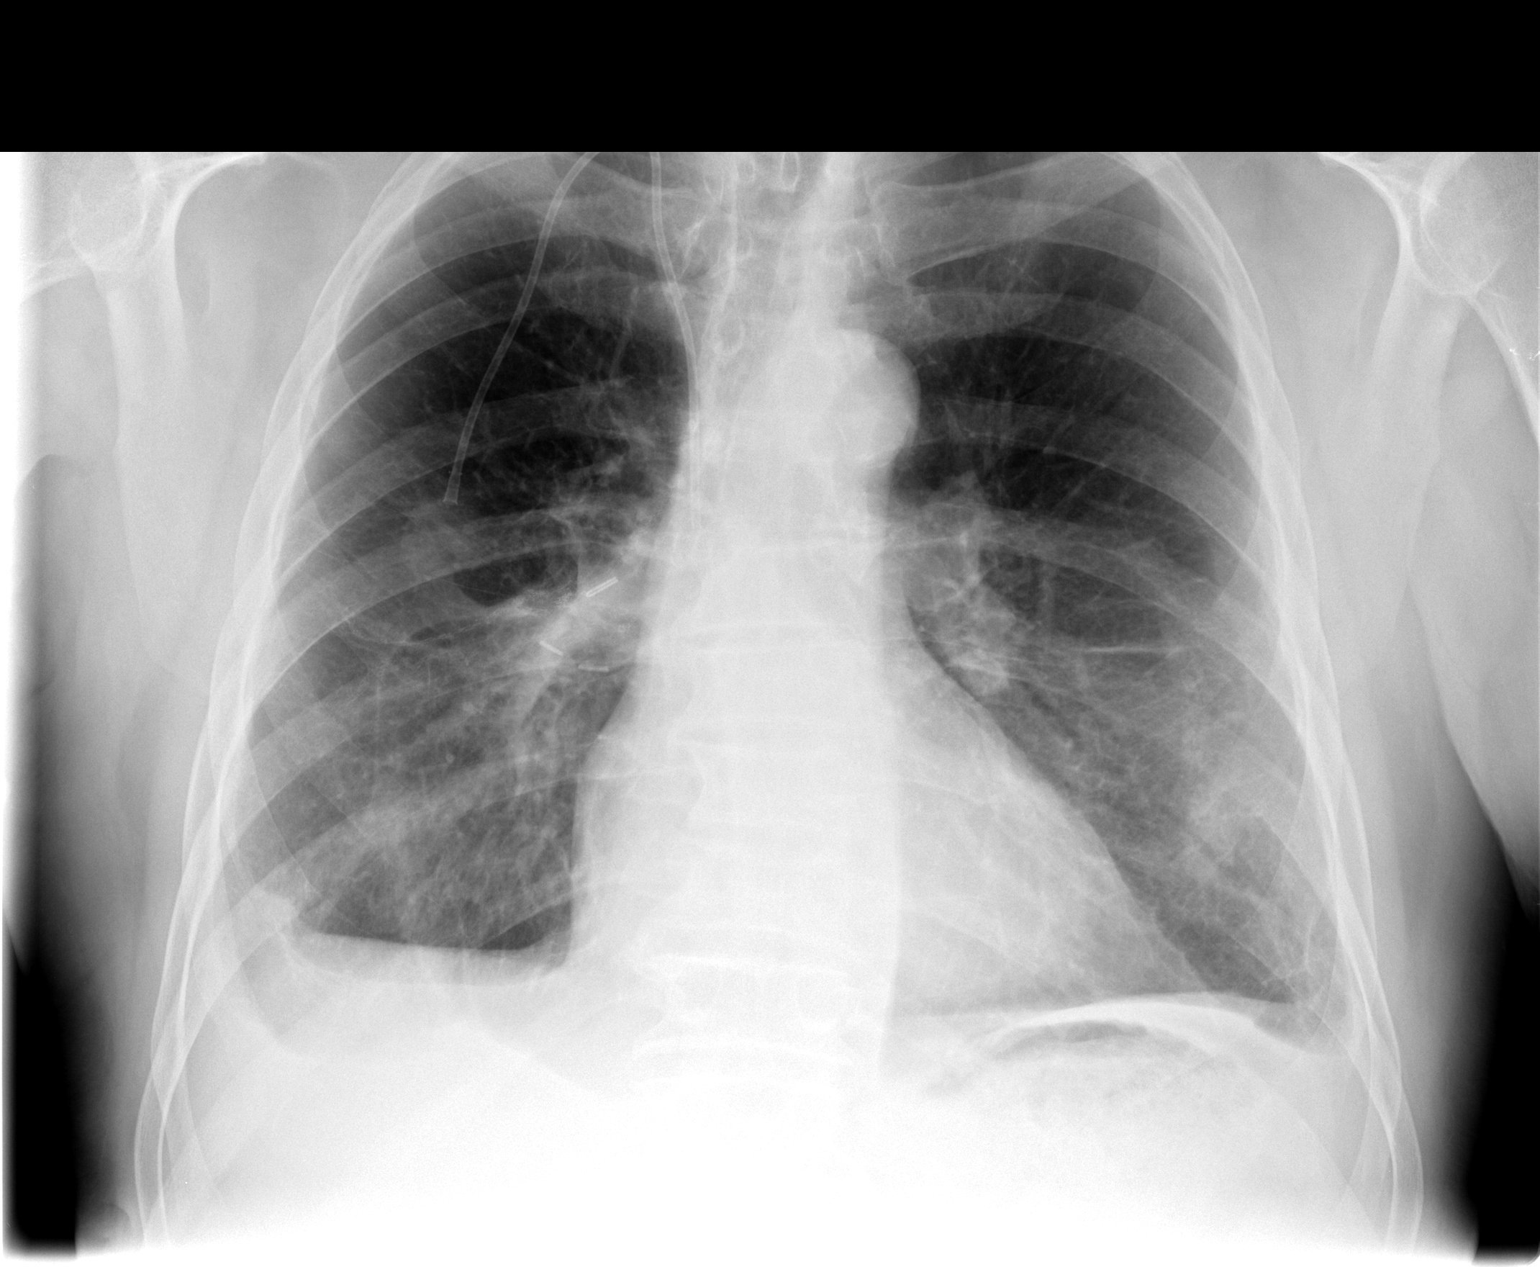

[view not recorded (2 of 2)]
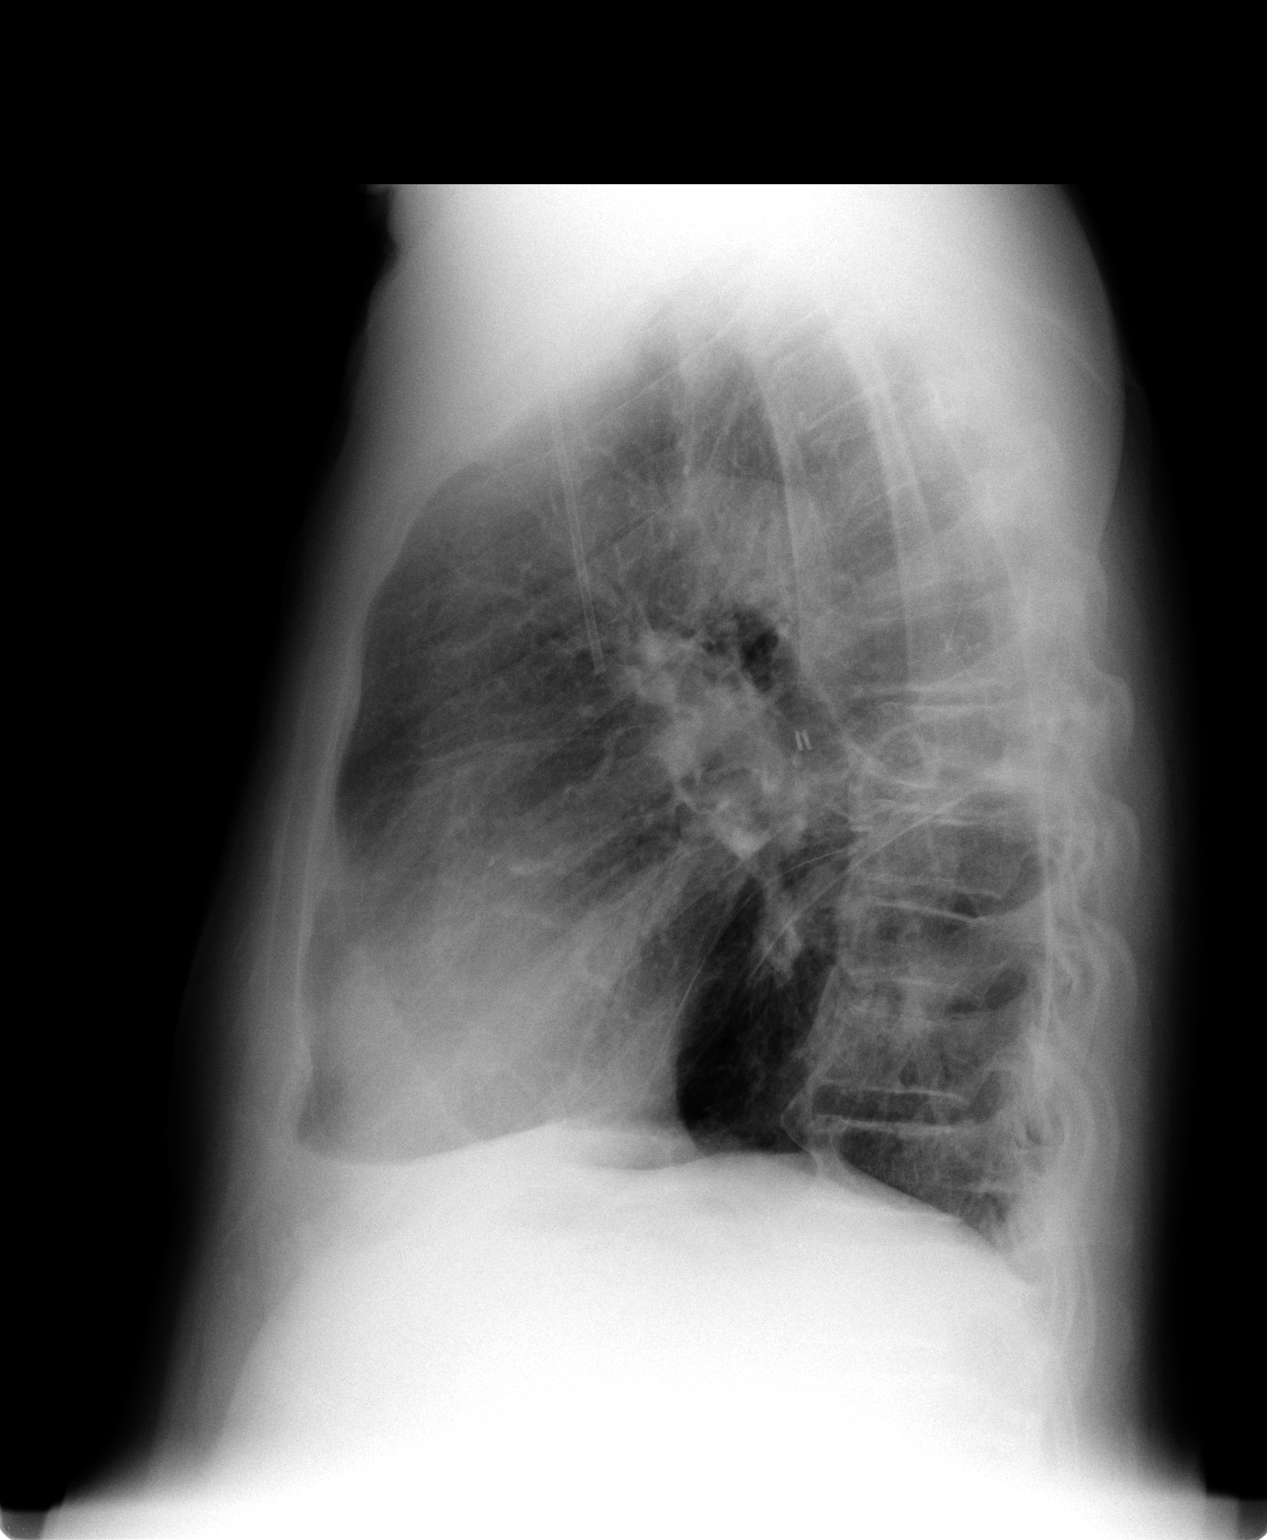

[2 of 2 positions shown; findings below may reference images not displayed]

FINDINGS: Bilateral pleural thickening and associated parenchymal
scarring appears stable.  Surgical clips again noted in the right
hilum.  No evidence of acute infiltrate or edema.  No hilar or
mediastinal mass identified.  Heart size is within normal limits.
Right-sided Port-A-Cath remains in appropriate position.
IMPRESSION: No significant change in appearance of bilateral pleural -
parenchymal scarring.  No active disease.

## 2010-04-26 ENCOUNTER — Ambulatory Visit: Payer: Self-pay | Admitting: Hematology & Oncology

## 2010-04-27 ENCOUNTER — Ambulatory Visit: Payer: Self-pay | Admitting: Diagnostic Radiology

## 2010-04-27 ENCOUNTER — Ambulatory Visit (HOSPITAL_BASED_OUTPATIENT_CLINIC_OR_DEPARTMENT_OTHER): Admission: RE | Admit: 2010-04-27 | Discharge: 2010-04-27 | Payer: Self-pay | Admitting: Hematology & Oncology

## 2010-04-27 IMAGING — CT CT CHEST W/ CM
4 of 5 series · 9 of 46 positions shown, 14 images · IV contrast (APPLIED)
Comparison: Plain film chest of [DATE].  PET of [DATE].
Diagnostic CTs of [DATE].

CT CHEST

CLINICAL DATA: Evaluate lung and colon cancer.  Chemotherapy
completed in [4I].  Constipation.  Diarrhea.  Smoker.

CT CHEST, ABDOMEN AND PELVIS WITH CONTRAST
TECHNIQUE: Contiguous axial images of the chest abdomen and pelvis
were obtained after IV contrast administration.
Contrast: 100  ml [4I]

[Series 2: chest/abd/pel 5.0 b31f · axial · 0.86mm/px · z∈[-626,-486]mm · 3 of 133 slices shown]
[im 14/133  soft-tissue]
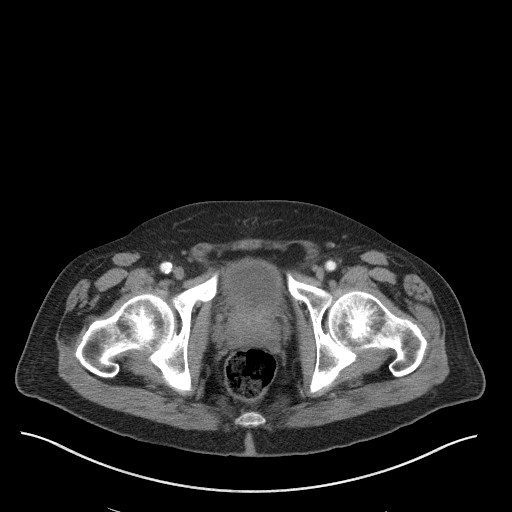
[im 28/133  soft-tissue]
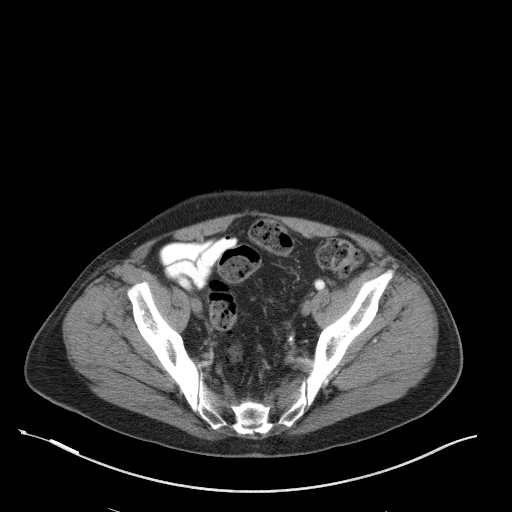
[im 42/133  soft-tissue]
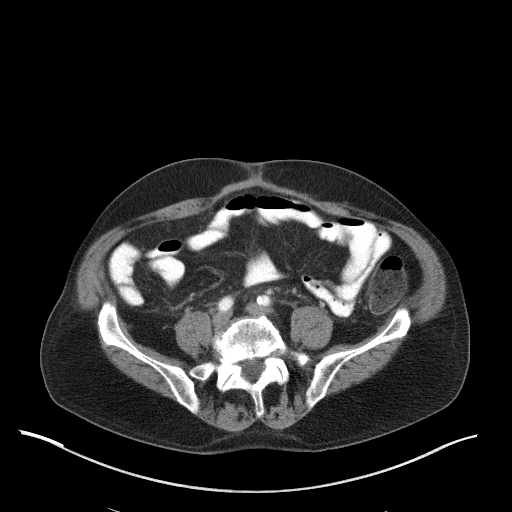

[Series 5: chest/abd/pel 3.0 coronal · coronal · 0.79mm/px · 3 of 86 slices shown, 4 images]
[im 29/86  soft-tissue]
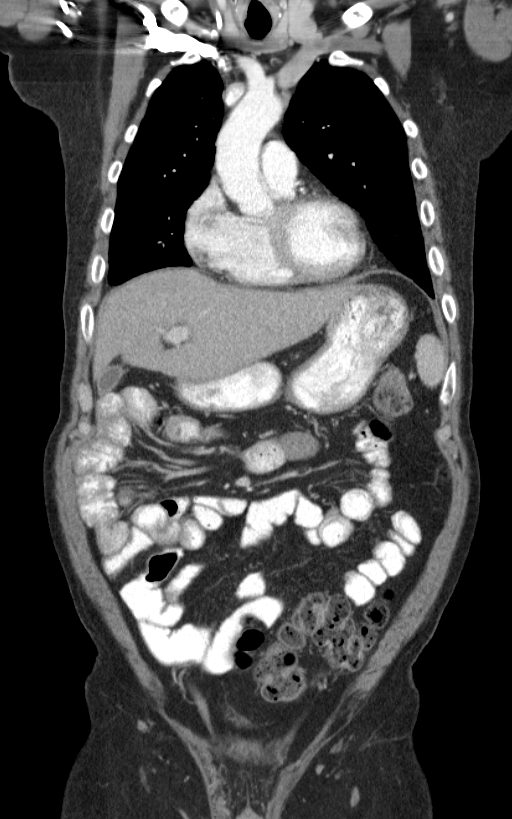
[im 38/86  soft-tissue]
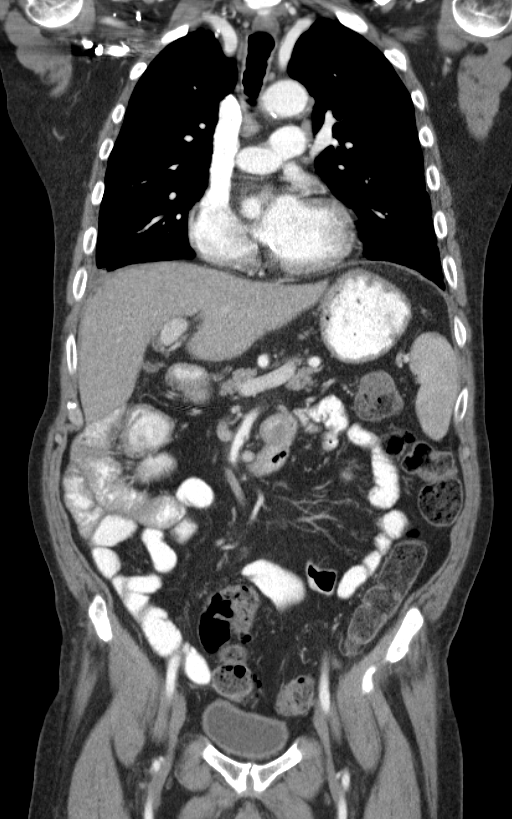
[im 38/86  bone]
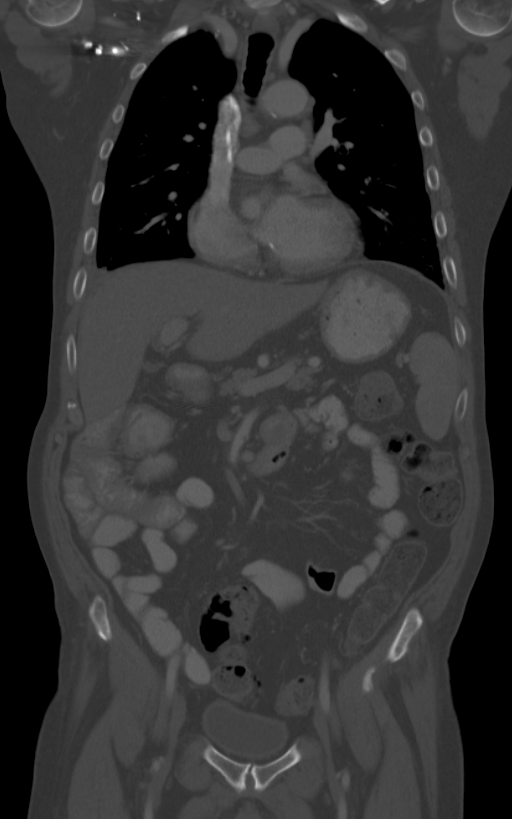
[im 48/86  soft-tissue]
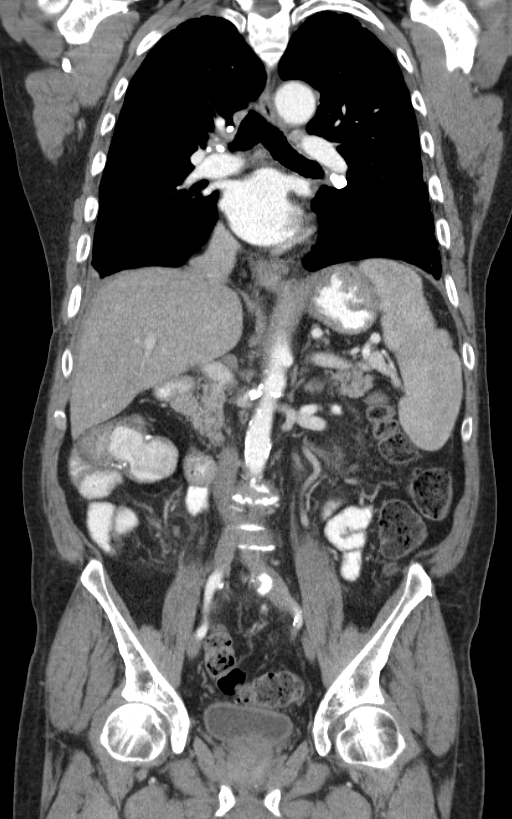

[Series 6: chest/abd/pel 3.0 sagittal · sagittal · 0.73mm/px · 1 of 123 slices shown, 2 images]
[im 41/123  soft-tissue]
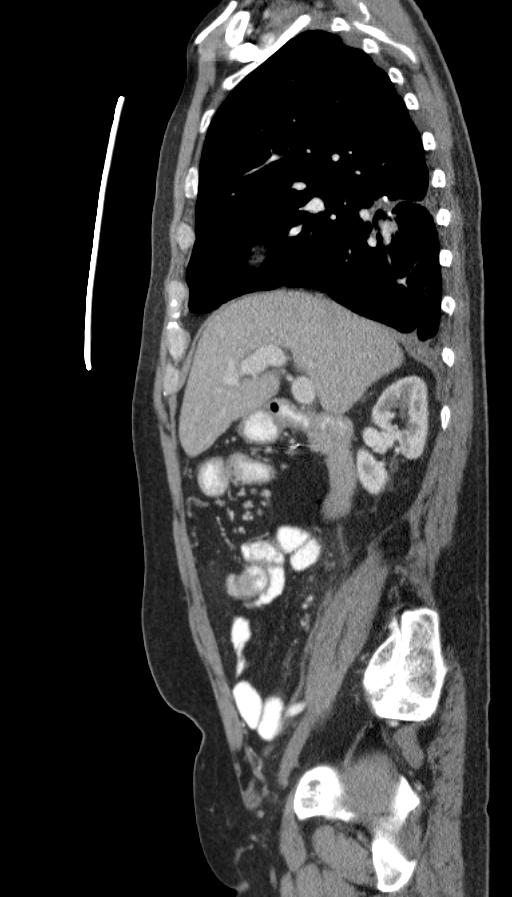
[im 41/123  bone]
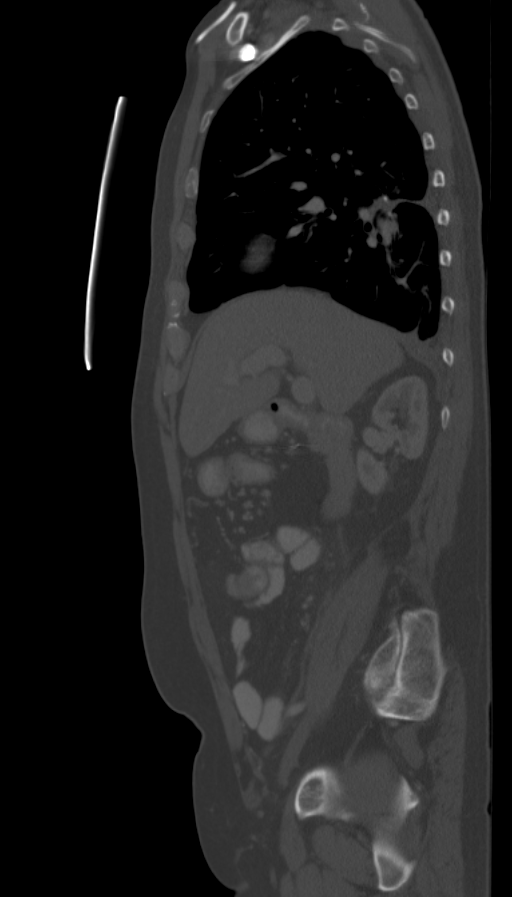

[Series 7: renal delay 5.0 b30f · axial · delayed · 0.87mm/px · z∈[-387,-342]mm · 2 of 28 slices shown, 5 images]
[im 10/28  soft-tissue]
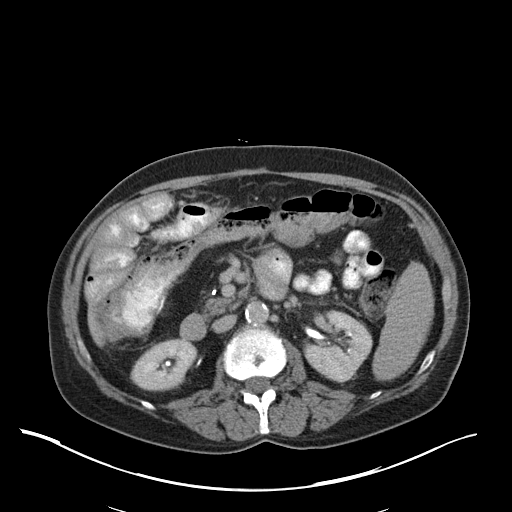
[im 10/28  lung]
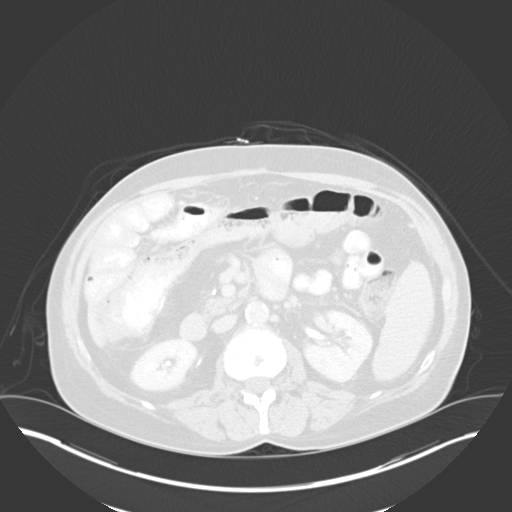
[im 10/28  bone]
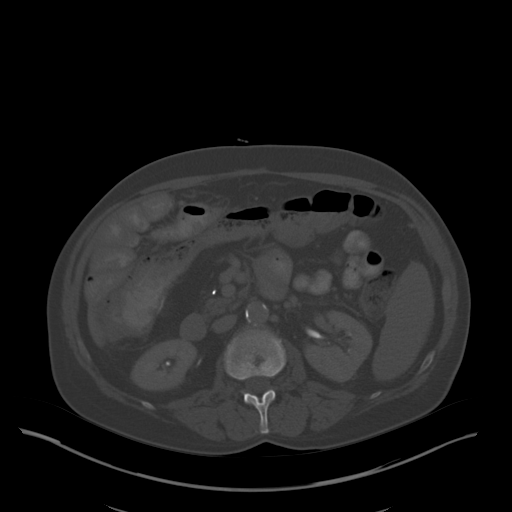
[im 19/28  soft-tissue]
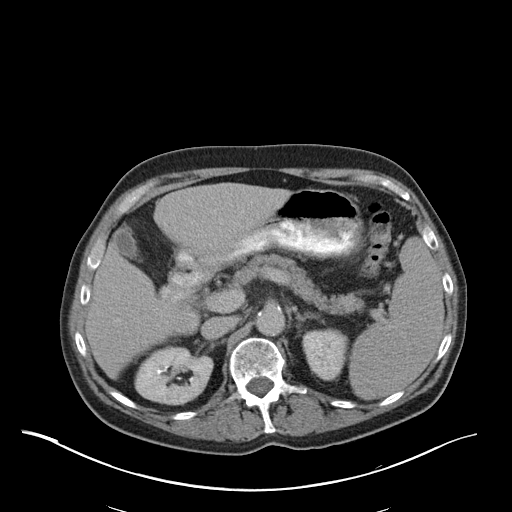
[im 19/28  lung]
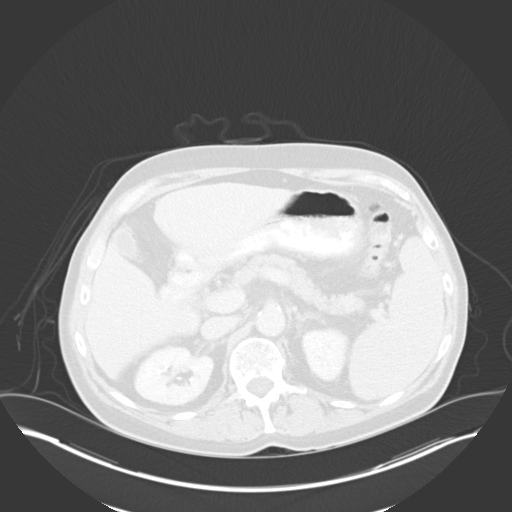

[9 of 46 positions shown; findings below may reference images not displayed]

FINDINGS: Lung windows demonstrate mild centrilobular emphysema.
Surgical changes within the superior segment of the right lower
lobe.  Resultant volume loss and soft tissue thickening.  No
evidence of residual lung nodule.
Mild volume loss at the left lung base.  Suspect mild rounded
atelectasis or scarring at the left lung base.  Similar to on the
prior exam.

Soft tissue windows demonstrate a right-sided Port-A-Cath which
terminates at the low SVC.  Borderline cardiomegaly.  Left-sided
pleural thickening and calcification.  Mild right-sided pleural
thickening and trace fluid which is likely postoperative.

Calcified middle mediastinal lymph nodes.  A retro esophageal 7 mm
node on image 38 is unchanged.  Borderline right hilar adenopathy.
1.3 cm on image 26.  Similar to on the prior exam.  This node is
also partially calcified.  Calcified left hilar lymph nodes as
well.

LAD coronary artery atherosclerosis versus less likely a stent.
IMPRESSION: 1.  Interval resection of the superior segment right lower lobe
lung nodule.  No evidence of recurrent or metastatic disease.
2.  No evidence of metastatic disease from the patient's colon
carcinoma.
3.  Left-sided pleural thickening/calcification.  Likely related to
prior empyema or hemothorax.
4.  Coronary artery atherosclerosis.

CT ABDOMEN AND PELVIS
FINDINGS: Normal liver, stomach, pancreas, gallbladder, biliary
tract, adrenal glands, kidneys.

Atherosclerosis of the origin of bilateral renal arteries.  A
circumaortic left renal vein. No retroperitoneal or retrocrural
adenopathy.

Colonic stool burden suggests constipation.

Surgical changes in the ascending colon.  Atypically, peripherally
positioned right abdominal small bowel loops including on image 77.
Likely postoperative and similar to on the prior exam.  No
obstruction.  No ascites.  Similar appearance of edema within the
ileocolic mesentery, including on image 84.

No pelvic adenopathy.    Normal urinary bladder.  Mild
prostatomegaly. No significant free fluid.  No acute osseous
abnormality. Degenerative disc disease at the lumbosacral junction.
Diffuse idiopathic skeletal hyperostosis within the lower thoracic
spine.  Multiple Schmorl's node deformities within the lumbar
spine.

Splenomegaly, 14.6 cm cranial caudal and 15.6 cm greatest
transverse.  Similar to on the prior exam.
IMPRESSION: 1. No acute process or evidence of metastatic disease in the
abdomen or pelvis.
2.  Surgical changes in the right colon.  No evidence of
obstruction or other acute complication.
3.  Splenomegaly, unchanged.
4.  Question constipation.

## 2010-05-10 ENCOUNTER — Ambulatory Visit: Payer: Self-pay | Admitting: Thoracic Surgery

## 2010-05-26 ENCOUNTER — Ambulatory Visit (HOSPITAL_BASED_OUTPATIENT_CLINIC_OR_DEPARTMENT_OTHER)
Admission: RE | Admit: 2010-05-26 | Discharge: 2010-05-26 | Payer: Self-pay | Source: Home / Self Care | Admitting: General Surgery

## 2010-08-16 ENCOUNTER — Ambulatory Visit: Payer: Self-pay | Admitting: Hematology & Oncology

## 2010-08-18 ENCOUNTER — Ambulatory Visit (HOSPITAL_BASED_OUTPATIENT_CLINIC_OR_DEPARTMENT_OTHER)
Admission: RE | Admit: 2010-08-18 | Discharge: 2010-08-18 | Payer: Self-pay | Source: Home / Self Care | Attending: Hematology & Oncology | Admitting: Hematology & Oncology

## 2010-08-18 LAB — CMP (CANCER CENTER ONLY)
ALT(SGPT): 18 U/L (ref 10–47)
AST: 26 U/L (ref 11–38)
Albumin: 4.1 g/dL (ref 3.3–5.5)
Alkaline Phosphatase: 111 U/L — ABNORMAL HIGH (ref 26–84)
BUN, Bld: 10 mg/dL (ref 7–22)
CO2: 28 mEq/L (ref 18–33)
Calcium: 9.4 mg/dL (ref 8.0–10.3)
Chloride: 104 mEq/L (ref 98–108)
Creat: 0.9 mg/dl (ref 0.6–1.2)
Glucose, Bld: 110 mg/dL (ref 73–118)
Potassium: 4 mEq/L (ref 3.3–4.7)
Sodium: 141 mEq/L (ref 128–145)
Total Bilirubin: 1 mg/dl (ref 0.20–1.60)
Total Protein: 8 g/dL (ref 6.4–8.1)

## 2010-08-18 LAB — CBC WITH DIFFERENTIAL (CANCER CENTER ONLY)
BASO#: 0.1 10*3/uL (ref 0.0–0.2)
BASO%: 1.4 % (ref 0.0–2.0)
EOS%: 2.8 % (ref 0.0–7.0)
Eosinophils Absolute: 0.2 10*3/uL (ref 0.0–0.5)
HCT: 49.4 % (ref 38.7–49.9)
HGB: 16.7 g/dL (ref 13.0–17.1)
LYMPH#: 1.1 10*3/uL (ref 0.9–3.3)
LYMPH%: 15 % (ref 14.0–48.0)
MCH: 33.9 pg — ABNORMAL HIGH (ref 28.0–33.4)
MCHC: 33.8 g/dL (ref 32.0–35.9)
MCV: 100 fL — ABNORMAL HIGH (ref 82–98)
MONO#: 0.5 10*3/uL (ref 0.1–0.9)
MONO%: 7.4 % (ref 0.0–13.0)
NEUT#: 5.4 10*3/uL (ref 1.5–6.5)
NEUT%: 73.4 % (ref 40.0–80.0)
Platelets: 101 10*3/uL — ABNORMAL LOW (ref 145–400)
RBC: 4.92 10*6/uL (ref 4.20–5.70)
RDW: 12.1 % (ref 10.5–14.6)
WBC: 7.3 10*3/uL (ref 4.0–10.0)

## 2010-08-18 LAB — CEA: CEA: 0.7 ng/mL (ref 0.0–5.0)

## 2010-08-18 IMAGING — CT CT CHEST W/ CM
4 series · 10 of 46 positions shown, 15 images · IV contrast (agent unspecified)
Comparison: CT [DATE].  PET CT [DATE].

CT CHEST

CLINICAL DATA: History of colon cancer status post colon
resection, chemotherapy and pulmonary nodule resection.

CT CHEST, ABDOMEN AND PELVIS WITH CONTRAST
TECHNIQUE: Multidetector CT imaging of the chest, abdomen and
pelvis was performed following the standard protocol during bolus
administration of intravenous contrast.
Contrast: 100 ml [O8] intravenously.

[Series 2: chest/abd/pel 5.0 b31f · axial · 0.74mm/px · z∈[-392,-307]mm · 2 of 138 slices shown]
[im 11/138  soft-tissue]
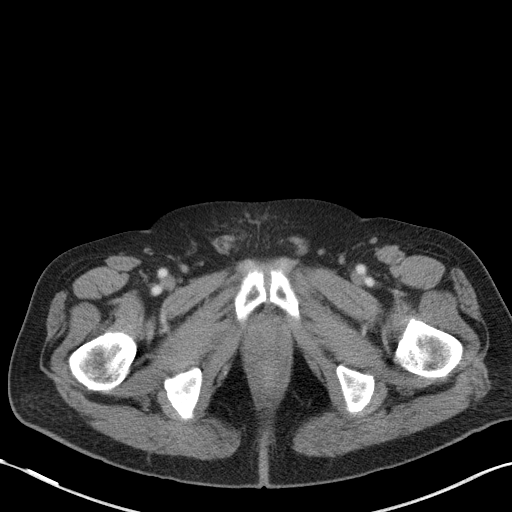
[im 28/138  soft-tissue]
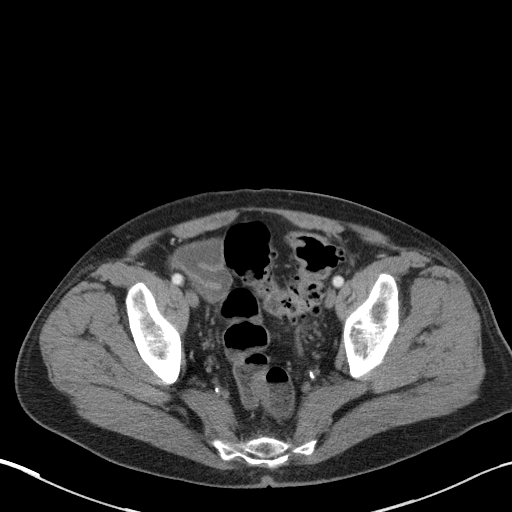

[Series 3: chest/abd/pel 3.0 coronal · coronal · 0.69mm/px · 3 of 76 slices shown]
[im 26/76  soft-tissue]
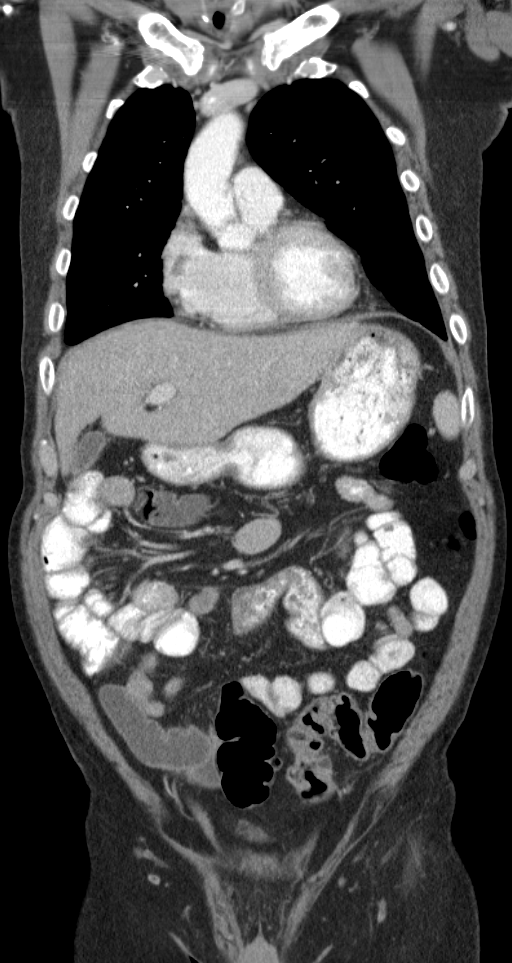
[im 34/76  soft-tissue]
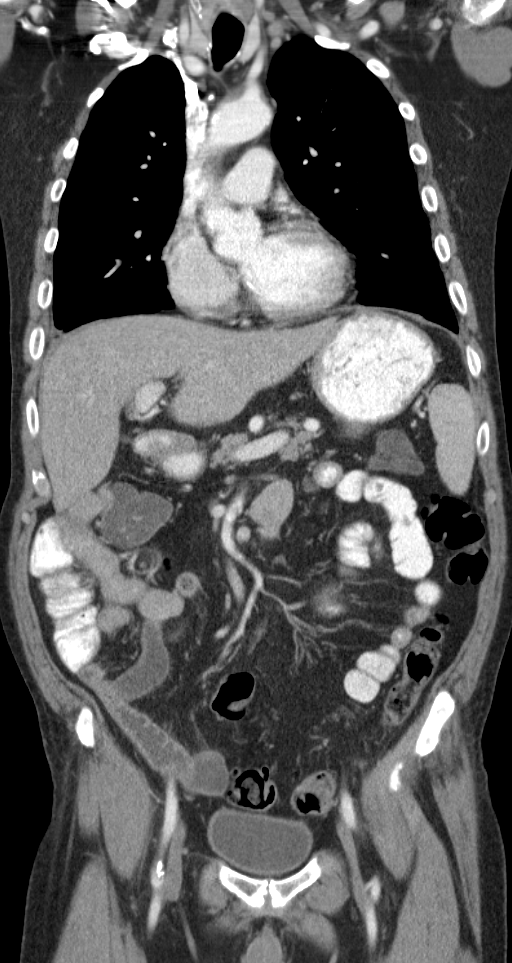
[im 42/76  soft-tissue]
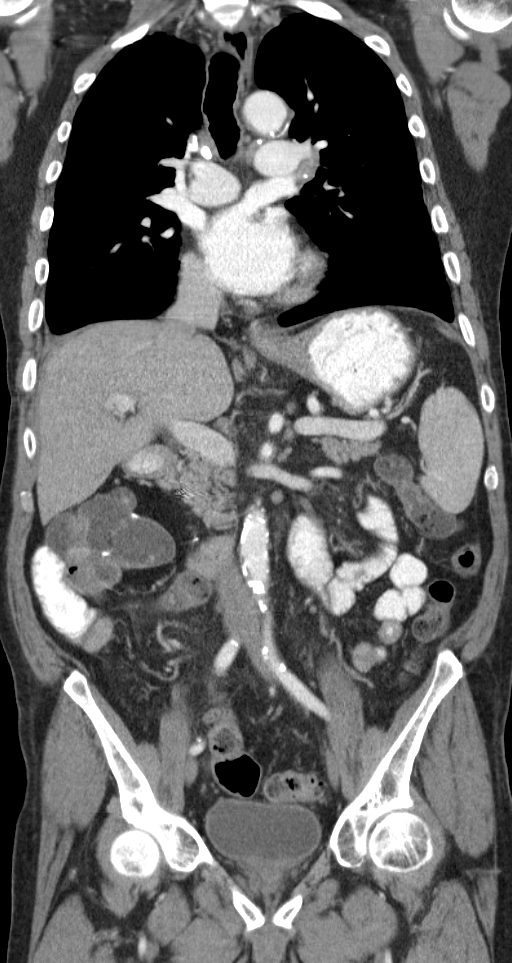

[Series 4: chest/abd/pel 3.0 sagittal · sagittal · 0.68mm/px · 1 of 107 slices shown]
[im 36/107  soft-tissue]
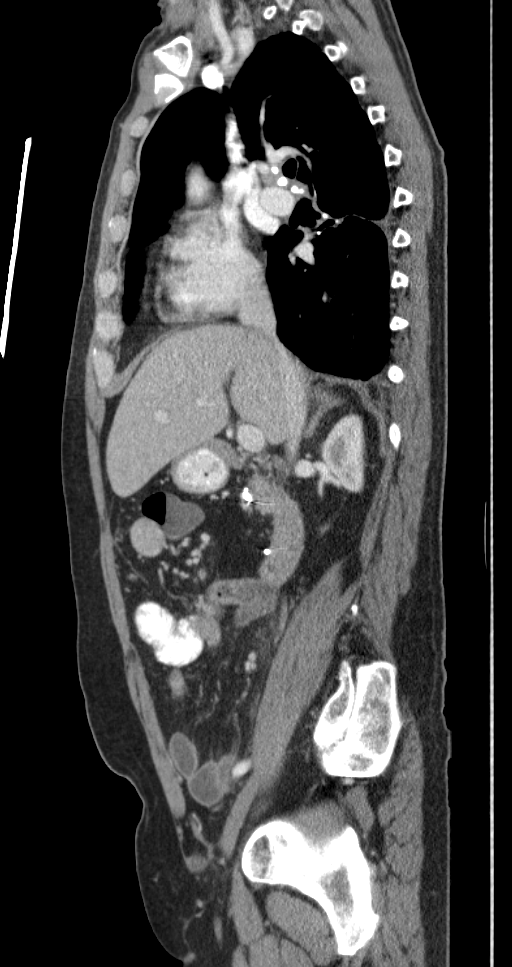

[Series 7: renal delay 5.0 b30f · axial · delayed · 0.71mm/px · z∈[-142,-57]mm · 4 of 29 slices shown, 9 images]
[im 6/29  soft-tissue]
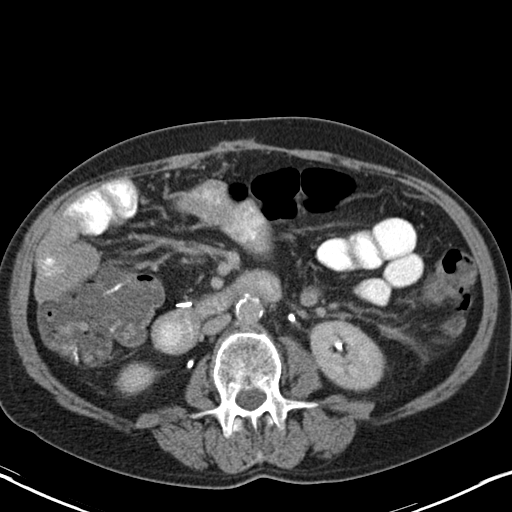
[im 6/29  lung]
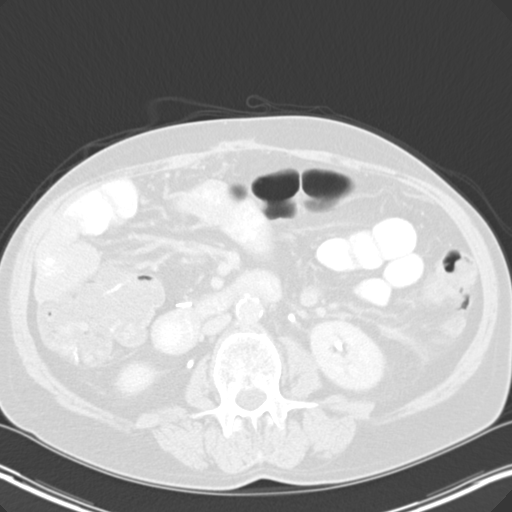
[im 6/29  bone]
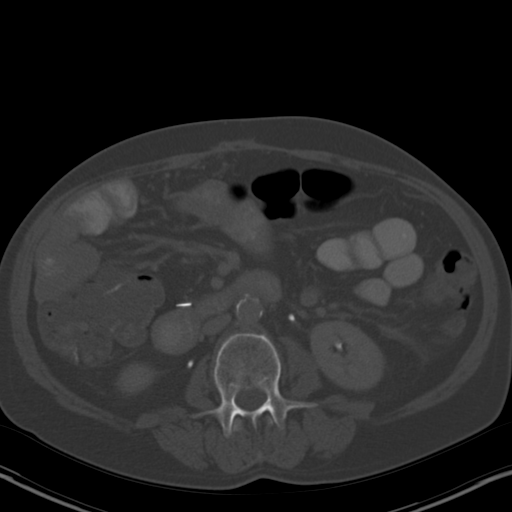
[im 12/29  soft-tissue]
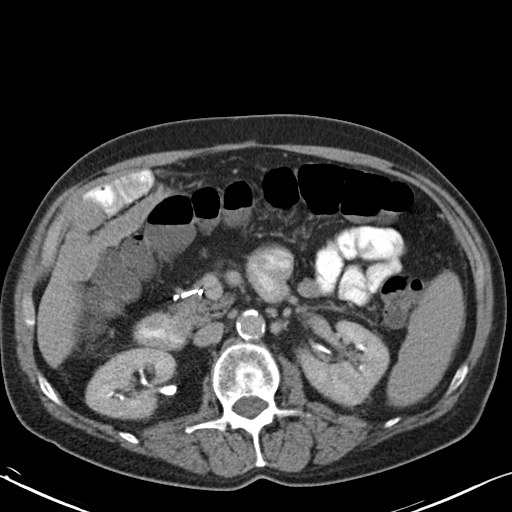
[im 12/29  lung]
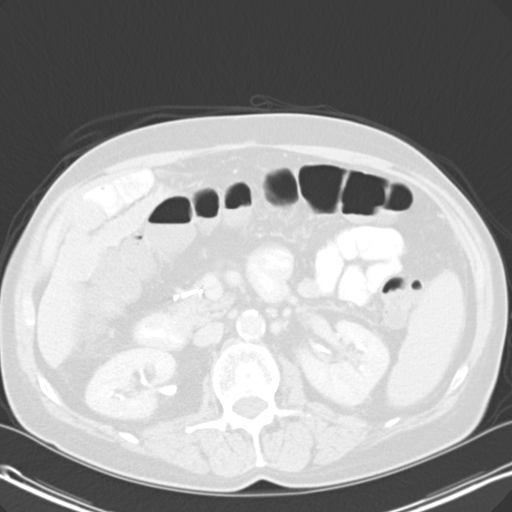
[im 17/29  soft-tissue]
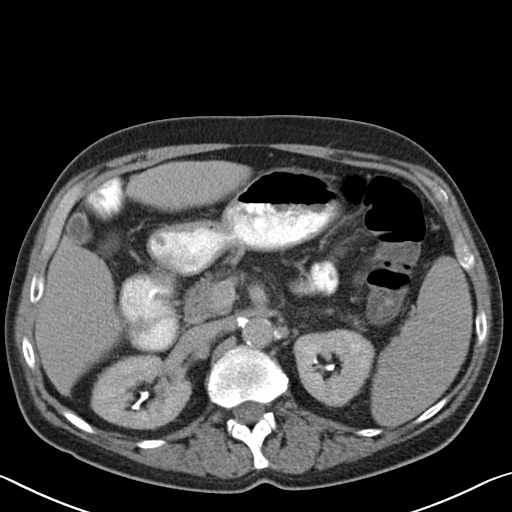
[im 17/29  lung]
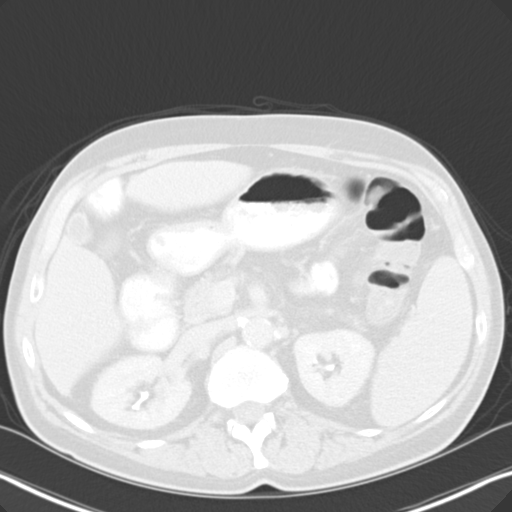
[im 23/29  soft-tissue]
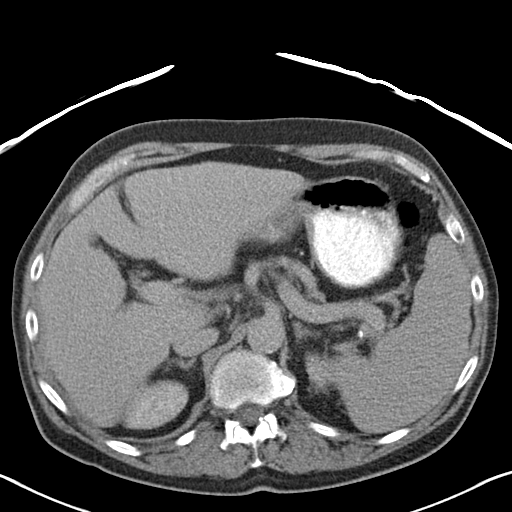
[im 23/29  lung]
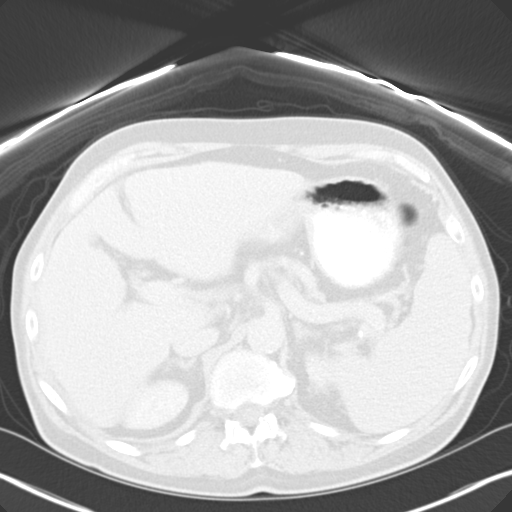

[10 of 46 positions shown; findings below may reference images not displayed]

FINDINGS: Port-A-Cath has been removed.  The mediastinum has a
stable appearance with stable calcified pretracheal, subcarinal and
hilar lymph nodes bilaterally.  No enlarged lymph nodes are
identified.  There is a stable small hiatal hernia.

There are stable postsurgical changes in the right lower lobe.
Probable areas of rounded atelectasis in both lower lobes are
unchanged.  There are no new or enlarging pulmonary nodules.  There
is no endobronchial lesion.  Pleural thickening and left-sided
pleural calcifications are unchanged.  There is no pleural or
pericardial effusion.
IMPRESSION: 1.  Stable postoperative chest CT.  No recurrent nodules
demonstrated.
2.  Stable calcified mediastinal and hilar lymph nodes.

CT ABDOMEN AND PELVIS
FINDINGS: The liver has a stable appearance without evidence of
metastatic disease.  The spleen is unchanged with mild
splenomegaly.  The gallbladder, pancreas and adrenal glands appear
normal.  The kidneys appear stable with a small cyst in the upper
pole of the right kidney.

There are stable postsurgical changes status post partial right
colectomy.  Mild soft tissue stranding in the adjacent mesenteric
fat is unchanged.  No peritoneal nodularity, ascites or adenopathy
is identified.  The bowel gas pattern is normal with stable lateral
position of small bowel loops in the right abdomen.

There is stable atherosclerosis.  There is stable thoracolumbar
spondylosis with scattered Schmorl's nodes.  No suspicious osseous
lesions are demonstrated.
IMPRESSION: 1.  Stable postoperative appearance of the abdomen.  No evidence of
local recurrence or metastatic disease.
2.  Stable atherosclerosis and spondylosis.

## 2010-10-27 LAB — POCT HEMOGLOBIN-HEMACUE: Hemoglobin: 15.3 g/dL (ref 13.0–17.0)

## 2010-11-07 LAB — POCT I-STAT 3, ART BLOOD GAS (G3+)
Acid-base deficit: 1 mmol/L (ref 0.0–2.0)
Bicarbonate: 23.8 mEq/L (ref 20.0–24.0)
Bicarbonate: 25.9 mEq/L — ABNORMAL HIGH (ref 20.0–24.0)
O2 Saturation: 92 %
O2 Saturation: 96 %
Patient temperature: 98.5
Patient temperature: 98.5
TCO2: 25 mmol/L (ref 0–100)
TCO2: 27 mmol/L (ref 0–100)
pCO2 arterial: 38.4 mmHg (ref 35.0–45.0)
pCO2 arterial: 43.7 mmHg (ref 35.0–45.0)
pH, Arterial: 7.38 (ref 7.350–7.450)
pH, Arterial: 7.399 (ref 7.350–7.450)
pO2, Arterial: 65 mmHg — ABNORMAL LOW (ref 80.0–100.0)
pO2, Arterial: 84 mmHg (ref 80.0–100.0)

## 2010-11-07 LAB — URINALYSIS, ROUTINE W REFLEX MICROSCOPIC
Bilirubin Urine: NEGATIVE
Glucose, UA: NEGATIVE mg/dL
Hgb urine dipstick: NEGATIVE
Ketones, ur: NEGATIVE mg/dL
Nitrite: NEGATIVE
Protein, ur: NEGATIVE mg/dL
Specific Gravity, Urine: 1.009 (ref 1.005–1.030)
Urobilinogen, UA: 1 mg/dL (ref 0.0–1.0)
pH: 6.5 (ref 5.0–8.0)

## 2010-11-07 LAB — TYPE AND SCREEN
ABO/RH(D): O POS
Antibody Screen: NEGATIVE

## 2010-11-07 LAB — COMPREHENSIVE METABOLIC PANEL
ALT: 14 U/L (ref 0–53)
ALT: 18 U/L (ref 0–53)
AST: 18 U/L (ref 0–37)
AST: 21 U/L (ref 0–37)
Albumin: 2.7 g/dL — ABNORMAL LOW (ref 3.5–5.2)
Albumin: 3.5 g/dL (ref 3.5–5.2)
Alkaline Phosphatase: 125 U/L — ABNORMAL HIGH (ref 39–117)
Alkaline Phosphatase: 79 U/L (ref 39–117)
BUN: 7 mg/dL (ref 6–23)
BUN: 9 mg/dL (ref 6–23)
CO2: 23 mEq/L (ref 19–32)
CO2: 28 mEq/L (ref 19–32)
Calcium: 8.2 mg/dL — ABNORMAL LOW (ref 8.4–10.5)
Calcium: 8.8 mg/dL (ref 8.4–10.5)
Chloride: 101 mEq/L (ref 96–112)
Chloride: 110 mEq/L (ref 96–112)
Creatinine, Ser: 0.62 mg/dL (ref 0.4–1.5)
Creatinine, Ser: 0.74 mg/dL (ref 0.4–1.5)
GFR calc Af Amer: >60 mL/min (ref >60)
GFR calc Af Amer: >60 mL/min (ref >60)
GFR calc non Af Amer: >60 mL/min (ref >60)
GFR calc non Af Amer: >60 mL/min (ref >60)
Glucose, Bld: 157 mg/dL — ABNORMAL HIGH (ref 70–99)
Glucose, Bld: 78 mg/dL (ref 70–99)
Potassium: 3.6 mEq/L (ref 3.5–5.1)
Potassium: 3.7 mEq/L (ref 3.5–5.1)
Sodium: 133 mEq/L — ABNORMAL LOW (ref 135–145)
Sodium: 140 mEq/L (ref 135–145)
Total Bilirubin: 0.6 mg/dL (ref 0.3–1.2)
Total Bilirubin: 1.2 mg/dL (ref 0.3–1.2)
Total Protein: 5.8 g/dL — ABNORMAL LOW (ref 6.0–8.3)
Total Protein: 6.6 g/dL (ref 6.0–8.3)

## 2010-11-07 LAB — BASIC METABOLIC PANEL
BUN: 10 mg/dL (ref 6–23)
BUN: 9 mg/dL (ref 6–23)
CO2: 26 mEq/L (ref 19–32)
CO2: 26 mEq/L (ref 19–32)
Calcium: 8.1 mg/dL — ABNORMAL LOW (ref 8.4–10.5)
Calcium: 8.2 mg/dL — ABNORMAL LOW (ref 8.4–10.5)
Chloride: 105 mEq/L (ref 96–112)
Chloride: 107 mEq/L (ref 96–112)
Creatinine, Ser: 0.69 mg/dL (ref 0.4–1.5)
Creatinine, Ser: 0.73 mg/dL (ref 0.4–1.5)
GFR calc Af Amer: >60 mL/min (ref >60)
GFR calc Af Amer: >60 mL/min (ref >60)
GFR calc non Af Amer: >60 mL/min (ref >60)
GFR calc non Af Amer: >60 mL/min (ref >60)
Glucose, Bld: 129 mg/dL — ABNORMAL HIGH (ref 70–99)
Glucose, Bld: 139 mg/dL — ABNORMAL HIGH (ref 70–99)
Potassium: 3.8 mEq/L (ref 3.5–5.1)
Potassium: 4 mEq/L (ref 3.5–5.1)
Sodium: 135 mEq/L (ref 135–145)
Sodium: 139 mEq/L (ref 135–145)

## 2010-11-07 LAB — BLOOD GAS, ARTERIAL
Acid-base deficit: 0.1 mmol/L (ref 0.0–2.0)
Bicarbonate: 23.9 mEq/L (ref 20.0–24.0)
Drawn by: 206361
FIO2: 0.21 %
O2 Saturation: 98.4 %
Patient temperature: 98.6
TCO2: 25 mmol/L (ref 0–100)
pCO2 arterial: 38.2 mmHg (ref 35.0–45.0)
pH, Arterial: 7.413 (ref 7.350–7.450)
pO2, Arterial: 90.7 mmHg (ref 80.0–100.0)

## 2010-11-07 LAB — CBC
HCT: 33.5 % — ABNORMAL LOW (ref 39.0–52.0)
HCT: 34.8 % — ABNORMAL LOW (ref 39.0–52.0)
HCT: 39.5 % (ref 39.0–52.0)
Hemoglobin: 11.8 g/dL — ABNORMAL LOW (ref 13.0–17.0)
Hemoglobin: 12.1 g/dL — ABNORMAL LOW (ref 13.0–17.0)
Hemoglobin: 13.7 g/dL (ref 13.0–17.0)
MCHC: 34.6 g/dL (ref 30.0–36.0)
MCHC: 34.9 g/dL (ref 30.0–36.0)
MCHC: 35.1 g/dL (ref 30.0–36.0)
MCV: 98 fL (ref 78.0–100.0)
MCV: 98.5 fL (ref 78.0–100.0)
MCV: 99.3 fL (ref 78.0–100.0)
Platelets: 101 10*3/uL — ABNORMAL LOW (ref 150–400)
Platelets: 91 10*3/uL — ABNORMAL LOW (ref 150–400)
Platelets: 91 10*3/uL — ABNORMAL LOW (ref 150–400)
RBC: 3.38 MIL/uL — ABNORMAL LOW (ref 4.22–5.81)
RBC: 3.54 MIL/uL — ABNORMAL LOW (ref 4.22–5.81)
RBC: 4.03 MIL/uL — ABNORMAL LOW (ref 4.22–5.81)
RDW: 14.6 % (ref 11.5–15.5)
RDW: 14.6 % (ref 11.5–15.5)
RDW: 15.1 % (ref 11.5–15.5)
WBC: 4.4 10*3/uL (ref 4.0–10.5)
WBC: 8.9 10*3/uL (ref 4.0–10.5)
WBC: 9.6 10*3/uL (ref 4.0–10.5)

## 2010-11-07 LAB — APTT: aPTT: 31 seconds (ref 24–37)

## 2010-11-07 LAB — PROTIME-INR
INR: 0.98 (ref 0.00–1.49)
Prothrombin Time: 12.9 seconds (ref 11.6–15.2)

## 2010-11-07 LAB — GLUCOSE, CAPILLARY: Glucose-Capillary: 90 mg/dL (ref 70–99)

## 2010-11-07 LAB — MRSA PCR SCREENING: MRSA by PCR: NEGATIVE

## 2010-11-20 LAB — DIFFERENTIAL
Basophils Absolute: 0 10*3/uL (ref 0.0–0.1)
Basophils Relative: 1 % (ref 0–1)
Eosinophils Absolute: 0.2 10*3/uL (ref 0.0–0.7)
Eosinophils Relative: 4 % (ref 0–5)
Lymphocytes Relative: 36 % (ref 12–46)
Lymphs Abs: 1.7 10*3/uL (ref 0.7–4.0)
Monocytes Absolute: 0.6 10*3/uL (ref 0.1–1.0)
Monocytes Relative: 13 % — ABNORMAL HIGH (ref 3–12)
Neutro Abs: 2.2 10*3/uL (ref 1.7–7.7)
Neutrophils Relative %: 46 % (ref 43–77)

## 2010-11-20 LAB — BASIC METABOLIC PANEL
BUN: 4 mg/dL — ABNORMAL LOW (ref 6–23)
CO2: 28 mEq/L (ref 19–32)
Calcium: 9 mg/dL (ref 8.4–10.5)
Chloride: 108 mEq/L (ref 96–112)
Creatinine, Ser: 0.65 mg/dL (ref 0.4–1.5)
GFR calc Af Amer: >60 mL/min (ref >60)
GFR calc non Af Amer: >60 mL/min (ref >60)
Glucose, Bld: 92 mg/dL (ref 70–99)
Potassium: 4.3 mEq/L (ref 3.5–5.1)
Sodium: 142 mEq/L (ref 135–145)

## 2010-11-20 LAB — CBC
HCT: 40.3 % (ref 39.0–52.0)
Hemoglobin: 14 g/dL (ref 13.0–17.0)
MCHC: 34.7 g/dL (ref 30.0–36.0)
MCV: 94.3 fL (ref 78.0–100.0)
Platelets: 128 10*3/uL — ABNORMAL LOW (ref 150–400)
RBC: 4.28 MIL/uL (ref 4.22–5.81)
RDW: 14.9 % (ref 11.5–15.5)
WBC: 4.8 10*3/uL (ref 4.0–10.5)

## 2010-11-22 LAB — BASIC METABOLIC PANEL
BUN: 5 mg/dL — ABNORMAL LOW (ref 6–23)
CO2: 22 mEq/L (ref 19–32)
Calcium: 7.8 mg/dL — ABNORMAL LOW (ref 8.4–10.5)
Chloride: 112 mEq/L (ref 96–112)
Creatinine, Ser: 0.65 mg/dL (ref 0.4–1.5)
GFR calc Af Amer: >60 mL/min (ref >60)
GFR calc non Af Amer: >60 mL/min (ref >60)
Glucose, Bld: 118 mg/dL — ABNORMAL HIGH (ref 70–99)
Potassium: 3.5 mEq/L (ref 3.5–5.1)
Sodium: 138 mEq/L (ref 135–145)

## 2010-11-22 LAB — CBC
HCT: 33.1 % — ABNORMAL LOW (ref 39.0–52.0)
Hemoglobin: 11.7 g/dL — ABNORMAL LOW (ref 13.0–17.0)
MCHC: 35.3 g/dL (ref 30.0–36.0)
MCV: 98.5 fL (ref 78.0–100.0)
Platelets: 189 10*3/uL (ref 150–400)
RBC: 3.36 MIL/uL — ABNORMAL LOW (ref 4.22–5.81)
RDW: 14.2 % (ref 11.5–15.5)
WBC: 6.5 10*3/uL (ref 4.0–10.5)

## 2010-11-23 LAB — BASIC METABOLIC PANEL
BUN: 10 mg/dL (ref 6–23)
BUN: 12 mg/dL (ref 6–23)
BUN: 24 mg/dL — ABNORMAL HIGH (ref 6–23)
BUN: 4 mg/dL — ABNORMAL LOW (ref 6–23)
BUN: 7 mg/dL (ref 6–23)
BUN: 7 mg/dL (ref 6–23)
CO2: 22 mEq/L (ref 19–32)
CO2: 23 mEq/L (ref 19–32)
CO2: 23 mEq/L (ref 19–32)
CO2: 23 mEq/L (ref 19–32)
CO2: 24 mEq/L (ref 19–32)
CO2: 26 mEq/L (ref 19–32)
Calcium: 7.7 mg/dL — ABNORMAL LOW (ref 8.4–10.5)
Calcium: 7.8 mg/dL — ABNORMAL LOW (ref 8.4–10.5)
Calcium: 7.9 mg/dL — ABNORMAL LOW (ref 8.4–10.5)
Calcium: 8 mg/dL — ABNORMAL LOW (ref 8.4–10.5)
Calcium: 8.1 mg/dL — ABNORMAL LOW (ref 8.4–10.5)
Calcium: 8.2 mg/dL — ABNORMAL LOW (ref 8.4–10.5)
Chloride: 107 mEq/L (ref 96–112)
Chloride: 107 mEq/L (ref 96–112)
Chloride: 108 mEq/L (ref 96–112)
Chloride: 110 mEq/L (ref 96–112)
Chloride: 111 mEq/L (ref 96–112)
Chloride: 112 mEq/L (ref 96–112)
Creatinine, Ser: 0.7 mg/dL (ref 0.4–1.5)
Creatinine, Ser: 0.79 mg/dL (ref 0.4–1.5)
Creatinine, Ser: 0.82 mg/dL (ref 0.4–1.5)
Creatinine, Ser: 0.86 mg/dL (ref 0.4–1.5)
Creatinine, Ser: 1.05 mg/dL (ref 0.4–1.5)
Creatinine, Ser: 1.21 mg/dL (ref 0.4–1.5)
GFR calc Af Amer: >60 mL/min (ref >60)
GFR calc Af Amer: >60 mL/min (ref >60)
GFR calc Af Amer: >60 mL/min (ref >60)
GFR calc Af Amer: >60 mL/min (ref >60)
GFR calc Af Amer: >60 mL/min (ref >60)
GFR calc Af Amer: >60 mL/min (ref >60)
GFR calc non Af Amer: 60 mL/min — ABNORMAL LOW (ref >60)
GFR calc non Af Amer: >60 mL/min (ref >60)
GFR calc non Af Amer: >60 mL/min (ref >60)
GFR calc non Af Amer: >60 mL/min (ref >60)
GFR calc non Af Amer: >60 mL/min (ref >60)
GFR calc non Af Amer: >60 mL/min (ref >60)
Glucose, Bld: 106 mg/dL — ABNORMAL HIGH (ref 70–99)
Glucose, Bld: 115 mg/dL — ABNORMAL HIGH (ref 70–99)
Glucose, Bld: 116 mg/dL — ABNORMAL HIGH (ref 70–99)
Glucose, Bld: 117 mg/dL — ABNORMAL HIGH (ref 70–99)
Glucose, Bld: 124 mg/dL — ABNORMAL HIGH (ref 70–99)
Glucose, Bld: 155 mg/dL — ABNORMAL HIGH (ref 70–99)
Potassium: 3.6 mEq/L (ref 3.5–5.1)
Potassium: 3.7 mEq/L (ref 3.5–5.1)
Potassium: 3.7 mEq/L (ref 3.5–5.1)
Potassium: 4 mEq/L (ref 3.5–5.1)
Potassium: 4.1 mEq/L (ref 3.5–5.1)
Potassium: 4.1 mEq/L (ref 3.5–5.1)
Sodium: 136 mEq/L (ref 135–145)
Sodium: 137 mEq/L (ref 135–145)
Sodium: 137 mEq/L (ref 135–145)
Sodium: 139 mEq/L (ref 135–145)
Sodium: 139 mEq/L (ref 135–145)
Sodium: 141 mEq/L (ref 135–145)

## 2010-11-23 LAB — COMPREHENSIVE METABOLIC PANEL
ALT: 20 U/L (ref 0–53)
AST: 23 U/L (ref 0–37)
Albumin: 4.5 g/dL (ref 3.5–5.2)
Alkaline Phosphatase: 115 U/L (ref 39–117)
BUN: 13 mg/dL (ref 6–23)
CO2: 26 mEq/L (ref 19–32)
Calcium: 10.3 mg/dL (ref 8.4–10.5)
Chloride: 99 mEq/L (ref 96–112)
Creatinine, Ser: 1.76 mg/dL — ABNORMAL HIGH (ref 0.4–1.5)
GFR calc Af Amer: 47 mL/min — ABNORMAL LOW (ref >60)
GFR calc non Af Amer: 39 mL/min — ABNORMAL LOW (ref >60)
Glucose, Bld: 166 mg/dL — ABNORMAL HIGH (ref 70–99)
Potassium: 4.3 mEq/L (ref 3.5–5.1)
Sodium: 138 mEq/L (ref 135–145)
Total Bilirubin: 1.2 mg/dL (ref 0.3–1.2)
Total Protein: 8 g/dL (ref 6.0–8.3)

## 2010-11-23 LAB — CBC
HCT: 33.3 % — ABNORMAL LOW (ref 39.0–52.0)
HCT: 33.8 % — ABNORMAL LOW (ref 39.0–52.0)
HCT: 37.8 % — ABNORMAL LOW (ref 39.0–52.0)
HCT: 43.3 % (ref 39.0–52.0)
HCT: 56.3 % — ABNORMAL HIGH (ref 39.0–52.0)
Hemoglobin: 11.9 g/dL — ABNORMAL LOW (ref 13.0–17.0)
Hemoglobin: 11.9 g/dL — ABNORMAL LOW (ref 13.0–17.0)
Hemoglobin: 13.3 g/dL (ref 13.0–17.0)
Hemoglobin: 15.3 g/dL (ref 13.0–17.0)
Hemoglobin: 19.4 g/dL — ABNORMAL HIGH (ref 13.0–17.0)
MCHC: 34.5 g/dL (ref 30.0–36.0)
MCHC: 35.1 g/dL (ref 30.0–36.0)
MCHC: 35.2 g/dL (ref 30.0–36.0)
MCHC: 35.3 g/dL (ref 30.0–36.0)
MCHC: 35.7 g/dL (ref 30.0–36.0)
MCV: 100.1 fL — ABNORMAL HIGH (ref 78.0–100.0)
MCV: 101.1 fL — ABNORMAL HIGH (ref 78.0–100.0)
MCV: 98.1 fL (ref 78.0–100.0)
MCV: 99.2 fL (ref 78.0–100.0)
MCV: 99.6 fL (ref 78.0–100.0)
Platelets: 134 10*3/uL — ABNORMAL LOW (ref 150–400)
Platelets: 146 10*3/uL — ABNORMAL LOW (ref 150–400)
Platelets: 151 10*3/uL (ref 150–400)
Platelets: 169 10*3/uL (ref 150–400)
Platelets: 182 10*3/uL (ref 150–400)
RBC: 3.4 MIL/uL — ABNORMAL LOW (ref 4.22–5.81)
RBC: 3.41 MIL/uL — ABNORMAL LOW (ref 4.22–5.81)
RBC: 3.79 MIL/uL — ABNORMAL LOW (ref 4.22–5.81)
RBC: 4.33 MIL/uL (ref 4.22–5.81)
RBC: 5.57 MIL/uL (ref 4.22–5.81)
RDW: 14.1 % (ref 11.5–15.5)
RDW: 14.4 % (ref 11.5–15.5)
RDW: 14.7 % (ref 11.5–15.5)
RDW: 14.8 % (ref 11.5–15.5)
RDW: 14.9 % (ref 11.5–15.5)
WBC: 14.8 10*3/uL — ABNORMAL HIGH (ref 4.0–10.5)
WBC: 6.4 10*3/uL (ref 4.0–10.5)
WBC: 7.8 10*3/uL (ref 4.0–10.5)
WBC: 8.1 10*3/uL (ref 4.0–10.5)
WBC: 9.1 10*3/uL (ref 4.0–10.5)

## 2010-11-23 LAB — URINE CULTURE: Colony Count: 3000

## 2010-11-23 LAB — DIFFERENTIAL
Basophils Absolute: 0.1 10*3/uL (ref 0.0–0.1)
Basophils Relative: 1 % (ref 0–1)
Eosinophils Absolute: 0 10*3/uL (ref 0.0–0.7)
Eosinophils Relative: 0 % (ref 0–5)
Lymphocytes Relative: 6 % — ABNORMAL LOW (ref 12–46)
Lymphs Abs: 0.9 10*3/uL (ref 0.7–4.0)
Monocytes Absolute: 0.7 10*3/uL (ref 0.1–1.0)
Monocytes Relative: 5 % (ref 3–12)
Neutro Abs: 13.1 10*3/uL — ABNORMAL HIGH (ref 1.7–7.7)
Neutrophils Relative %: 89 % — ABNORMAL HIGH (ref 43–77)

## 2010-11-23 LAB — WOUND CULTURE: Culture: NO GROWTH

## 2010-11-23 LAB — URINALYSIS, ROUTINE W REFLEX MICROSCOPIC
Glucose, UA: NEGATIVE mg/dL
Hgb urine dipstick: NEGATIVE
Ketones, ur: 15 mg/dL — AB
Nitrite: POSITIVE — AB
Protein, ur: 100 mg/dL — AB
Specific Gravity, Urine: 1.03 (ref 1.005–1.030)
Urobilinogen, UA: 1 mg/dL (ref 0.0–1.0)
pH: 5 (ref 5.0–8.0)

## 2010-11-23 LAB — URINE MICROSCOPIC-ADD ON

## 2010-11-23 LAB — CK TOTAL AND CKMB (NOT AT ARMC)
CK, MB: 3.3 ng/mL (ref 0.3–4.0)
Relative Index: INVALID (ref 0.0–2.5)
Total CK: 61 U/L (ref 7–232)

## 2010-11-23 LAB — PROTIME-INR
INR: 1.1 (ref 0.00–1.49)
Prothrombin Time: 14.4 seconds (ref 11.6–15.2)

## 2010-11-23 LAB — TYPE AND SCREEN
ABO/RH(D): O POS
Antibody Screen: NEGATIVE

## 2010-11-23 LAB — TROPONIN I: Troponin I: 0.01 ng/mL (ref 0.00–0.06)

## 2010-11-23 LAB — ABO/RH: ABO/RH(D): O POS

## 2010-11-23 LAB — LIPASE, BLOOD: Lipase: 17 U/L (ref 11–59)

## 2010-11-23 LAB — CEA
CEA: 1.8 ng/mL (ref 0.0–5.0)
CEA: 2 ng/mL (ref 0.0–5.0)

## 2010-11-23 LAB — PHOSPHORUS: Phosphorus: 3.1 mg/dL (ref 2.3–4.6)

## 2010-11-23 LAB — APTT: aPTT: 30 seconds (ref 24–37)

## 2010-11-23 LAB — MAGNESIUM: Magnesium: 2 mg/dL (ref 1.5–2.5)

## 2010-12-22 ENCOUNTER — Other Ambulatory Visit: Payer: Self-pay | Admitting: Hematology & Oncology

## 2010-12-22 ENCOUNTER — Ambulatory Visit (HOSPITAL_BASED_OUTPATIENT_CLINIC_OR_DEPARTMENT_OTHER)
Admission: RE | Admit: 2010-12-22 | Discharge: 2010-12-22 | Disposition: A | Discharge status: Home / Self Care | Payer: Medicare Other | Source: Ambulatory Visit | Attending: Hematology & Oncology | Admitting: Hematology & Oncology

## 2010-12-22 ENCOUNTER — Encounter: Payer: Medicare Other | Admitting: Hematology & Oncology

## 2010-12-22 DIAGNOSIS — C182 Malignant neoplasm of ascending colon: Secondary | ICD-10-CM

## 2010-12-22 DIAGNOSIS — C189 Malignant neoplasm of colon, unspecified: Secondary | ICD-10-CM

## 2010-12-22 DIAGNOSIS — K5730 Diverticulosis of large intestine without perforation or abscess without bleeding: Secondary | ICD-10-CM | POA: Insufficient documentation

## 2010-12-22 DIAGNOSIS — C801 Malignant (primary) neoplasm, unspecified: Secondary | ICD-10-CM

## 2010-12-22 DIAGNOSIS — K7200 Acute and subacute hepatic failure without coma: Secondary | ICD-10-CM | POA: Insufficient documentation

## 2010-12-22 DIAGNOSIS — R161 Splenomegaly, not elsewhere classified: Secondary | ICD-10-CM | POA: Insufficient documentation

## 2010-12-22 DIAGNOSIS — C3430 Malignant neoplasm of lower lobe, unspecified bronchus or lung: Secondary | ICD-10-CM

## 2010-12-22 DIAGNOSIS — Z85038 Personal history of other malignant neoplasm of large intestine: Secondary | ICD-10-CM

## 2010-12-22 LAB — CBC WITH DIFFERENTIAL (CANCER CENTER ONLY)
BASO#: 0 10*3/uL (ref 0.0–0.2)
BASO%: 0.4 % (ref 0.0–2.0)
EOS%: 3.9 % (ref 0.0–7.0)
Eosinophils Absolute: 0.2 10*3/uL (ref 0.0–0.5)
HCT: 45 % (ref 38.7–49.9)
HGB: 16.4 g/dL (ref 13.0–17.1)
LYMPH#: 1.2 10*3/uL (ref 0.9–3.3)
LYMPH%: 26.1 % (ref 14.0–48.0)
MCH: 33.5 pg — ABNORMAL HIGH (ref 28.0–33.4)
MCHC: 36.4 g/dL — ABNORMAL HIGH (ref 32.0–35.9)
MCV: 92 fL (ref 82–98)
MONO#: 0.5 10*3/uL (ref 0.1–0.9)
MONO%: 11.1 % (ref 0.0–13.0)
NEUT#: 2.7 10*3/uL (ref 1.5–6.5)
NEUT%: 58.5 % (ref 40.0–80.0)
Platelets: 97 10*3/uL — ABNORMAL LOW (ref 145–400)
RBC: 4.9 10*6/uL (ref 4.20–5.70)
RDW: 14.7 % (ref 11.1–15.7)
WBC: 4.7 10*3/uL (ref 4.0–10.0)

## 2010-12-22 LAB — CMP (CANCER CENTER ONLY)
ALT(SGPT): 18 U/L (ref 10–47)
AST: 22 U/L (ref 11–38)
Albumin: 4 g/dL (ref 3.3–5.5)
Alkaline Phosphatase: 110 U/L — ABNORMAL HIGH (ref 26–84)
BUN, Bld: 9 mg/dL (ref 7–22)
CO2: 28 mEq/L (ref 18–33)
Calcium: 9.1 mg/dL (ref 8.0–10.3)
Chloride: 100 mEq/L (ref 98–108)
Creat: 0.8 mg/dl (ref 0.6–1.2)
Glucose, Bld: 99 mg/dL (ref 73–118)
Potassium: 4.3 mEq/L (ref 3.3–4.7)
Sodium: 137 mEq/L (ref 128–145)
Total Bilirubin: 1.3 mg/dl (ref 0.20–1.60)
Total Protein: 7.8 g/dL (ref 6.4–8.1)

## 2010-12-22 LAB — CEA: CEA: 1.2 ng/mL (ref 0.0–5.0)

## 2010-12-22 IMAGING — CT CT CHEST W/ CM
4 series · 10 of 46 positions shown, 15 images · IV contrast (APPLIED)
Comparison: [DATE]

CT CHEST

CLINICAL DATA: Follow-up metastatic colon carcinoma.  Undergoing
chemotherapy.

CT CHEST, ABDOMEN AND PELVIS WITH CONTRAST
TECHNIQUE: Multidetector CT imaging of the chest, abdomen and
pelvis was performed following the standard protocol during bolus
administration of intravenous contrast.
Contrast: 100 ml [QC] and oral contrast

[Series 2: chest/abd/pel 5.0 b31f · axial · 0.74mm/px · z∈[+776,+862]mm · 2 of 136 slices shown]
[im 11/136  soft-tissue]
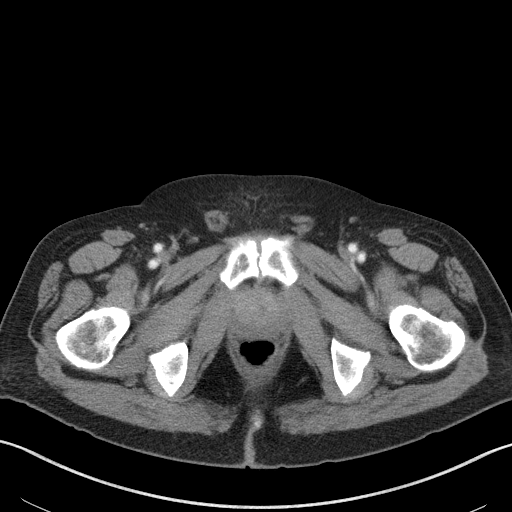
[im 28/136  soft-tissue]
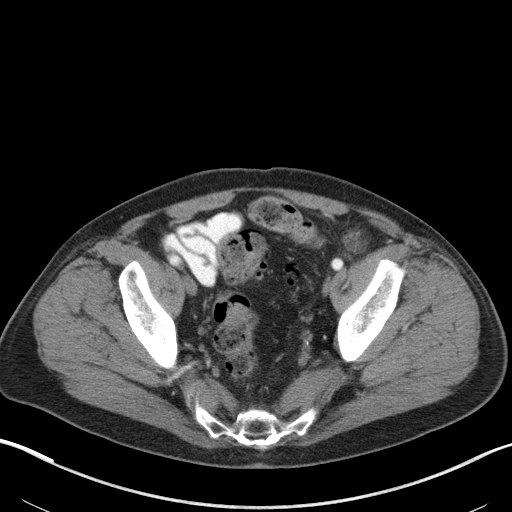

[Series 5: chest/abd/pel 3.0 coronal · coronal · 0.73mm/px · 3 of 98 slices shown]
[im 33/98  soft-tissue]
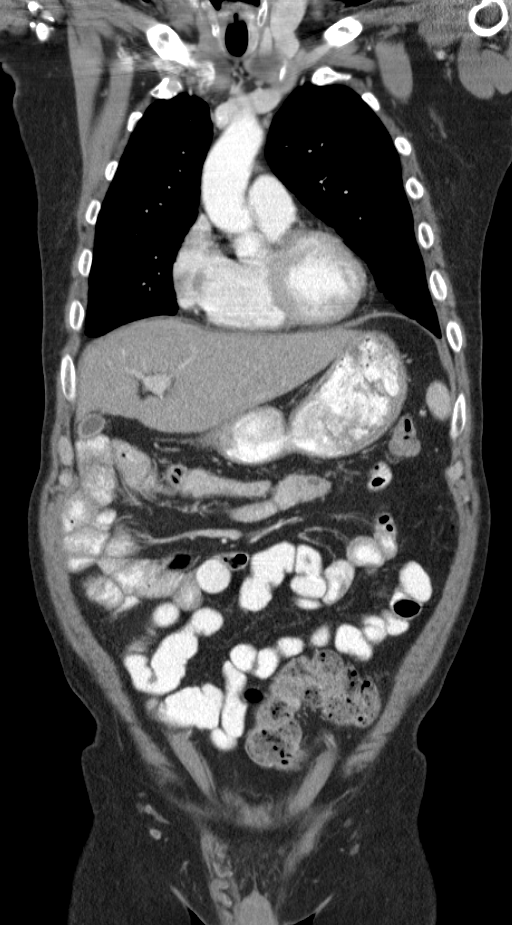
[im 44/98  soft-tissue]
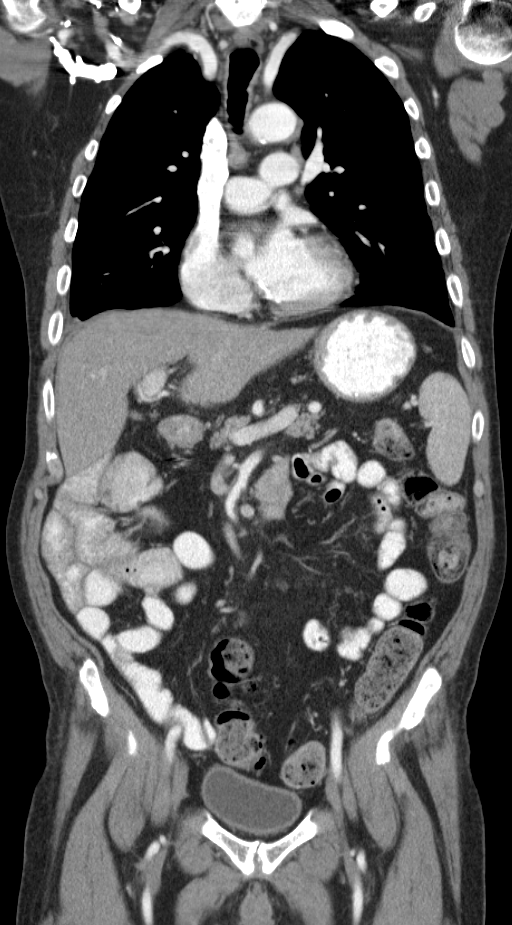
[im 54/98  soft-tissue]
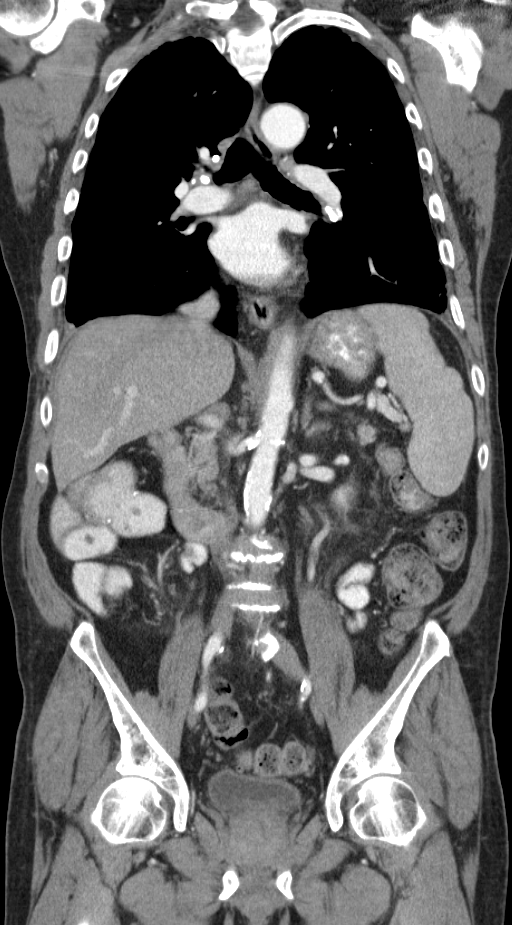

[Series 6: chest/abd/pel 3.0 sagittal · sagittal · 0.59mm/px · 1 of 127 slices shown]
[im 43/127  soft-tissue]
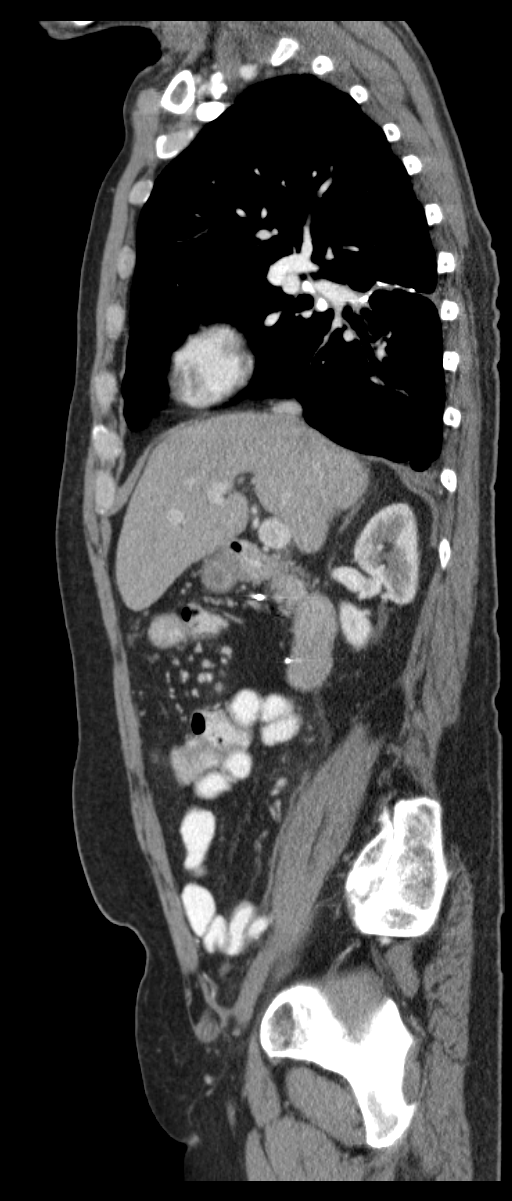

[Series 7: renal delay 5.0 b30f · axial · delayed · 0.73mm/px · z∈[+1010,+1104]mm · 4 of 33 slices shown, 9 images]
[im 7/33  soft-tissue]
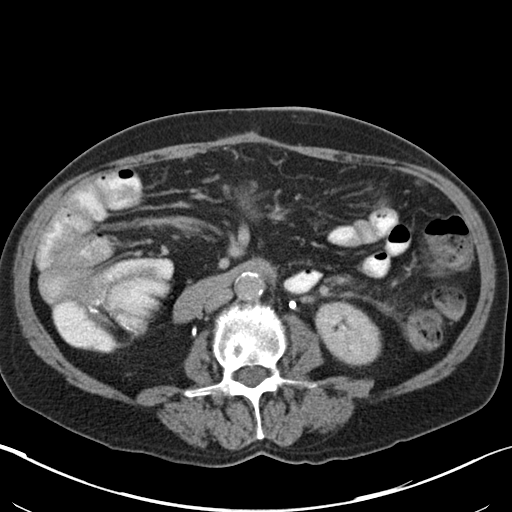
[im 7/33  lung]
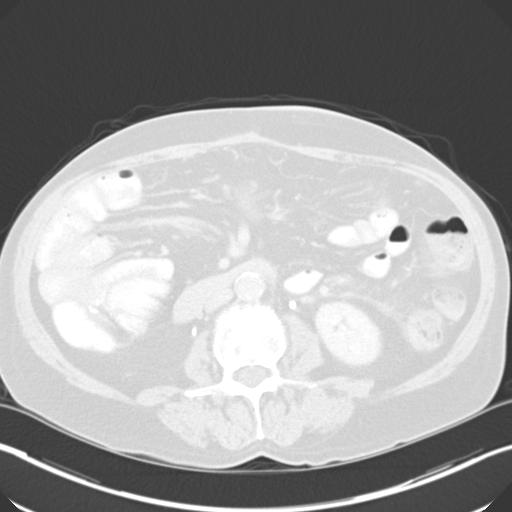
[im 7/33  bone]
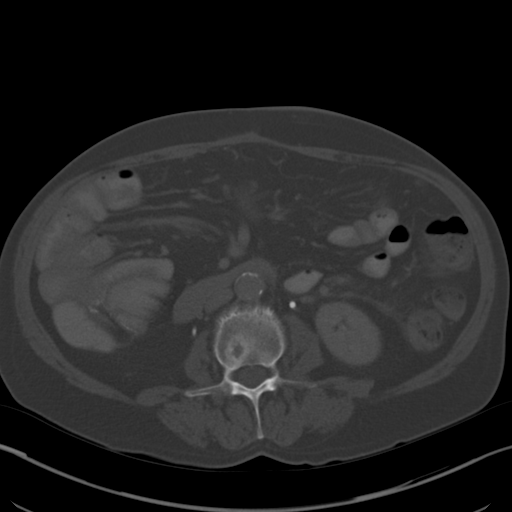
[im 13/33  soft-tissue]
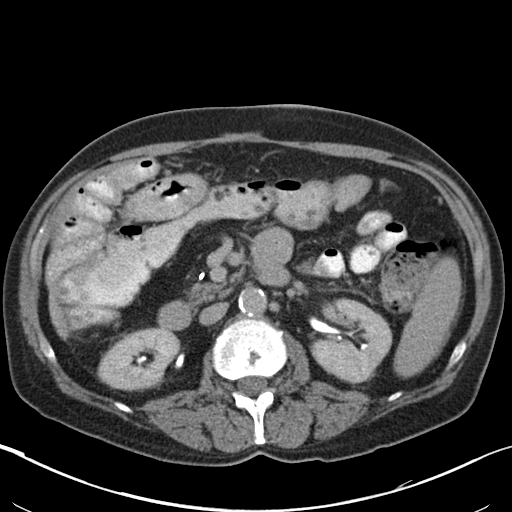
[im 13/33  lung]
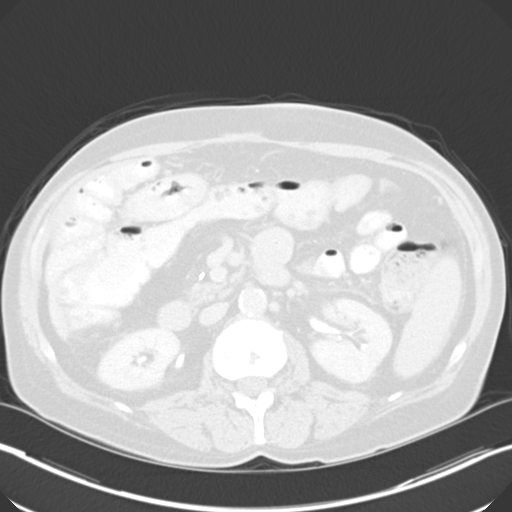
[im 20/33  soft-tissue]
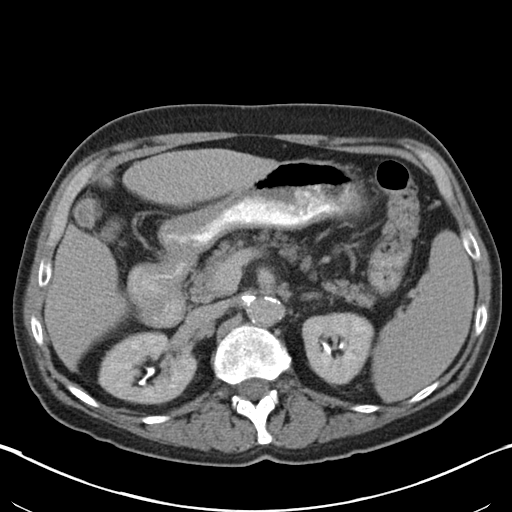
[im 20/33  lung]
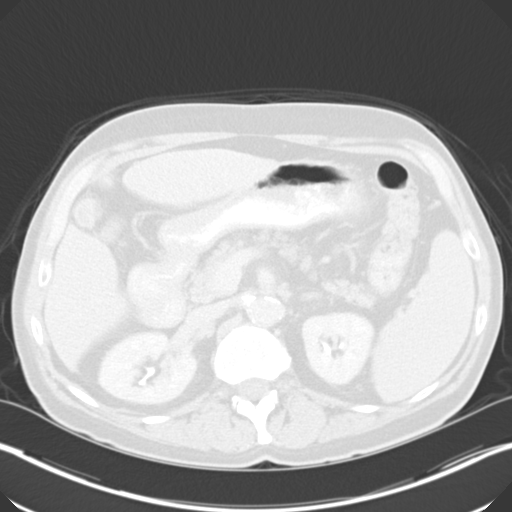
[im 26/33  soft-tissue]
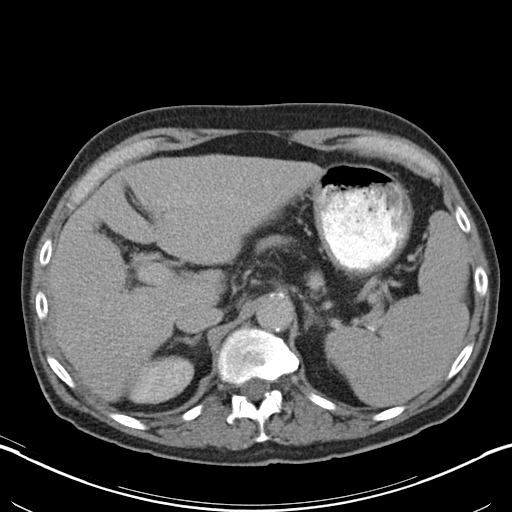
[im 26/33  lung]
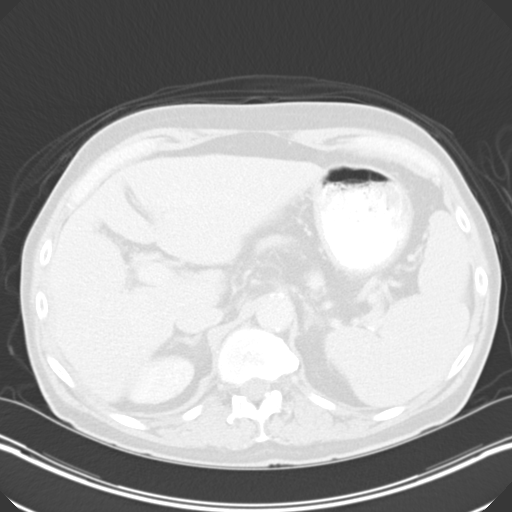

[10 of 46 positions shown; findings below may reference images not displayed]

FINDINGS: Bilateral pleural - parenchymal scarring remains stable.
No suspicious pulmonary nodules or masses are identified.  No
evidence of pulmonary infiltrate.  No evidence of pleural or
pericardial effusion.  No mediastinal or hilar masses are
identified.  No evidence of lymphadenopathy within the thorax.  No
suspicious bone lesions are identified.
IMPRESSION: Stable chest CT.  No evidence of metastatic disease or other acute
findings.

CT ABDOMEN AND PELVIS
FINDINGS: No liver masses are identified.  Right hepatic lobe
atrophy is again seen with mild surface nodularity, suspicious for
hepatic cirrhosis.  Gallbladder is unremarkable.  Mild splenomegaly
is stable and suspicious for portal venous hypertension.  No
evidence of ascites.

The pancreas, adrenal glands, and kidneys are normal in appearance.
No soft tissue masses or lymphadenopathy seen within the abdomen or
pelvis.  Postop changes are again seen from previous right
colectomy.  Sigmoid diverticulosis is again seen, however there is
no evidence of diverticulitis.  No other inflammatory process or
abnormal fluid collections identified.  No suspicious bone lesions
are identified.
IMPRESSION: 1.  No evidence of recurrent or metastatic carcinoma.
2.  Suspect early hepatic cirrhosis, with mild splenomegaly
secondary to portal venous hypertension.   Recommend correlation
with liver function tests, and consider diagnostic percutaneous
needle biopsy if clinically warranted.
3.  Diverticulosis.  No radiographic evidence of diverticulitis.

## 2010-12-22 MED ORDER — IOHEXOL 300 MG/ML  SOLN
100.0000 mL | Freq: Once | INTRAMUSCULAR | Status: AC | PRN
  Administered 2010-12-22: 100 mL via INTRAVENOUS

## 2010-12-27 NOTE — H&P (Signed)
NAMENAVDEEP, HALT                  ACCOUNT NO.:  [id number]   MEDICAL RECORD NO.:  [id number]          PATIENT TYPE:  INP   LOCATION:  5152                         FACILITY:  [facility]   PHYSICIAN:  Almond Lint, MD       DATE OF BIRTH:  11-21-1940   DATE OF ADMISSION:  12/02/2008  DATE OF DISCHARGE:                              HISTORY & PHYSICAL   CHIEF COMPLAINT:  Colonic obstruction.   HISTORY OF PRESENT ILLNESS:  Mr. Eschmann is a 70 year old male with a 4-  day history of abdominal distention, nausea, vomiting, and abdominal  pain.  He states that his most recent bowel symptoms started around 3-4  days ago.  He has not eaten for 2 days.  He has been unable to tolerate  anything other than a small amount of clear liquids.  He states that  around 4 months ago, he required the use of a lot of laxatives and stool  softeners and had this similar episode of bloating.  He states however  it did not get bad and he did not get where he was unable to eat.  He  has worsening of symptoms with eating.   PAST MEDICAL HISTORY:  He does not know of any medical problems, but has  not ever been to the doctor until today.   PAST SURGICAL HISTORY:  Negative.   FAMILY HISTORY:  CVA and MI in the father and the mother with  Alzheimer's.   SOCIAL HISTORY:  One pack per day of tobacco use for at least 40 years,  3-4 beers a day.  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   DRUG ALLERGIES:  None.   MEDICATIONS:  None.   REVIEW OF SYSTEMS:  Negative x11 except per the HPI.   PHYSICAL EXAMINATION:  VITAL SIGNS:  Temperature is 98.2, pulse 89,  blood pressure 137/81, respiratory rate 18.  GENERAL:  He is alert and oriented x3 in no acute distress.  HEENT:  Normocephalic, atraumatic.  Pupils equal, round, and reactive to  light.  NECK:  Supple with no lymphadenopathy.  No thyromegaly.  Trachea is  midline.  HEART:  Regular with no  murmurs.  LUNGS:  Clear bilaterally.  ABDOMEN:  Soft, very distended, nontender, and there is hypoactive bowel  sounds.  EXTREMITIES:  Warm and well perfused with no gross motor or sensory  deficits.   Chest CT demonstrates apical lesion in the cephalic flexure with no  signs of metastasis to liver.  There is a 2-cm mass in the lung.   LABORATORY DATA:  White count 14.8, hematocrit is 56.3, creatinine is  1.76, chloride is 99.  UA shows positive nitrites and many bacteria.   IMPRESSION:  Mr. Guinta is a 70 year old male with obstructing colon  mass probably colon cancer.  I will obtain a CEA on him.  We will  examine his chest CT to better evaluate the chest mass.  We will make  him n.p.o. and put in an NG-tube.  He needs IV  pain medications.  If he  gets to where he can tolerate the lack of decompression, we can perform  a colonoscopy if we are able to get him cleaned out.      Almond Lint, MD  Electronically Signed     FB/MEDQ  D:  12/03/2008  T:  12/03/2008  Job:  [id number]

## 2010-12-27 NOTE — Letter (Signed)
May 10, 2010   Rose Phi. Myna Hidalgo, MD  [address]  [place], [place] [address]   Re:  CALIPH, BOROWIAK                  DOB:  03-14-1941   Dear Dr. Myna Hidalgo:   The patient came for followup today.  He is doing well.  His CT scan  showed no evidence of recurrence.  He saw you recently and I will let  you continue to follow him.  I will be happy to see him again if he has  any future problems.  His Port-A-Cath is going to be removed by Dr.  Abbey Chatters.  His blood pressure was 141/75, pulse 64, respirations 18,  and sats were 97%.   [name], M.D.  Electronically Signed   DPB/MEDQ  D:  05/10/2010  T:  05/11/2010  Job:  [id number]

## 2010-12-27 NOTE — Consult Note (Signed)
NAME:  Jerome Kelley, Jerome Kelley                  ACCOUNT NO.:  [id number]   MEDICAL RECORD NO.:  [id number]          PATIENT TYPE:  INP   LOCATION:  5152                         FACILITY:  [facility]   PHYSICIAN:  John C. Madilyn Kelley, M.D.    DATE OF BIRTH:  [birth year]   DATE OF CONSULTATION:  12/03/2008  DATE OF DISCHARGE:                                 CONSULTATION   We were asked to see Jerome Kelley today in consultation for colon mask and  high-grade obstruction by Dr. Almond Kelley.   HISTORY OF PRESENT ILLNESS:  This is a 70 year old male who reports that  he has never seen a doctor.  He started having abdominal pain,  distention and vomiting Tuesday night at about 8 p.m. after eating a  cheeseburger for dinner.  He has been unable to eat since.  He had a  similar incident back in January 2010 that he thought was the stomach  flu. It resolved on its own after a few days.  The patient denies any  blood in his stool or emesis.  He denies any weight loss, fever,  shortness of breath, dizziness or heart palpitations.  He describes a  long tobacco history and alcohol history and takes no medications.  He  has never had a colonoscopy or endoscopy.  On CT scan yesterday, he had  a large lesion at his hepatic flexure that was causing a high-grade  obstruction.  The patient feels much better now that his NG tube is in  place.   PAST MEDICAL HISTORY:  None.   CURRENT MEDICATIONS:  None.   ALLERGIES:  He has no known drug allergies.   REVIEW OF SYSTEMS:  As per HPI.   SOCIAL HISTORY:  He smokes 1 pack of tobacco per day and has for past 40  years.  Tells me that he drinks three to four beers per day.  Denies any  recreational drugs.   FAMILY HISTORY:  Significant for no known bowel disease or colon cancer.   PHYSICAL EXAMINATION:  GENERAL:  He is alert and oriented, has an NG  tube in place, appears in no distress.  His NG tank is three-quarters  the way full with greenish brown liquid.  HEART:  Regular  rate and rhythm with no obvious murmurs, rubs or  gallops.  LUNGS:  No wheezes or crackles.  ABDOMEN:  Soft, nontender, nondistended with hypoactive bowel sounds.   LABORATORY DATA:  Done yesterday show a white count of 14.8, hemoglobin  19.4, hematocrit 56.3, platelets 192,000.  CMET done today shows sodium  137, potassium 3.7, chloride 107, bicarb 23, BUN 24, creatinine 1.05,  glucose 115.  PT 14.4, INR 1.1, AST 23, ALT 20, alk phos 115, total  bilirubin 1.2, serum phosphorus 3.1. and serum magnesium 2.0.   Radiological exams include an x-ray that shows:  1. Chronic changes in his lungs.  2. Partial small bowel obstruction, this was done yesterday.      Following that, he had a CT of the abdomen and pelvis that shows:  3. Markedly dilated esophagus that is fluid  filled.  4. High-grade small and proximal large bowel obstruction secondary to      apple-core lesion in the hepatic flexure that extends over 4 cm.  5. Possible left lower lobe metastasis.   ASSESSMENT:  Dr. Dorena Kelley has seen and examined the patient, collected  a history and reviewed his chart.  His impression is this is a 70-year-  old gentleman with high-grade bowel obstruction of the small and  proximal large bowel secondary to 4-cm lesion in his hepatic flexure.  He also has significant hydration which is improved now with intravenous  fluids.   PLAN:  If possible carefully cleanse the patient's bowel for colonoscopy  with biopsy will likely be somewhat difficult given his high-grade  obstruction but we will very carefully proceed.   Thanks very much for this consultation.      Stephani Police, PA    ______________________________  Jerome Kelley, M.D.    MLY/MEDQ  D:  12/03/2008  T:  12/04/2008  Job:  [id number]   cc:   Jerome Lint, MD

## 2010-12-27 NOTE — Op Note (Signed)
NAMEBRONDON, Jerome Kelley                  ACCOUNT NO.:  [id number]   MEDICAL RECORD NO.:  [id number]          PATIENT TYPE:  INP   LOCATION:  5152                         FACILITY:  [facility]   PHYSICIAN:  Adolph Pollack, M.D.DATE OF BIRTH:  [birth year]   DATE OF PROCEDURE:  12/05/2008  DATE OF DISCHARGE:                               OPERATIVE REPORT   PREOPERATIVE DIAGNOSIS:  Obstructing right colon neoplasm.   POSTOPERATIVE DIAGNOSIS:  Obstructing right colon neoplasm.   PROCEDURE:  Right hemicolectomy.   SURGEON:  Adolph Pollack, MD   ASSISTANT:  Ardeth Sportsman, MD   ANESTHESIA:  General.   INDICATIONS:  This 70 year old male came into the emergency department  on December 02, 2008, and was found to have a large bowel obstruction.  He  is for an obstructing neoplasm.  He has had colonoscopy as well as CT  scan.  He now is brought to the operating room for urgent right  colectomy because obstructing neoplasm.  We have gone over the procedure  and risks in detail.   TECHNIQUE:  He is brought to the operating room, placed supine on the  operating table, and general anesthetic was administered.  A Foley  catheter was inserted.  The hair in the abdominal wall was clipped and  the area sterilely prepped and draped.  A midline incision was made  dividing the skin, subcutaneous tissue, fascia, and peritoneum entering  the peritoneal cavity.  Straw-colored ascites was noted and some was  sent for cytology.  Dilated small bowel was noted as well.  I evacuated  the straw-colored ascites as much as possible.   I then eviscerated the small intestine and mobilized the right colon by  dividing the lateral attachments using electrocautery.  The tumor was  just right at the hepatic flexure with some omentum adherent to it.  I  divided the omentum leaving the portion adherent to the tumor attached  to the bowel.  I then mobilized the transverse colon by dissecting the  omentum off of it.  The  tissues were edematous, friable, and bled  easily.  I came to my plan of dissection above the ureter and identified  the duodenum as well.   I then chose a point in the terminal ileum and divided the ileum with a  linear cutting stapler.  I also chose a part at the proximal one-third  of the transverse colon and divided it with a linear cutting stapler.  I  took a large wedge-shaped specimen of mesentery, ligating vessels with  ties, and dividing the mesentery with a LigaSure device.  Specimen was  handed off the field, taken to the back table and opened where there  tumor was noted as long as number of small polyps closed to it.   Following this, I placed the distal ileum in a side-to-side fashion with  a transverse colon and a stapled side-to-side anastomosis was performed  with a linear cutting stapler.  The area of common defect was closed  with a linear noncutting stapler.  The anastomosis was patent, viable  under  no tension.  The distal staple line was reinforced with 3-0 silk  suture.   At this point, gloves were changed, abdominal cavity was irrigated with  saline solution.  Hemostasis was noted to be adequate.   I refreshed the sponge count and it was reported to be correct.  Irrigation fluid was then evacuated as much as possible.  The liver was  inspected and no lesions were noted.  No drop metastases were noted in  the pelvis and no peritoneal implants were noted.   Following this, I replaced the small bowel back into abdominal cavity  with proper orientation.  The omentum was then placed over the small  bowel.  The fascia was closed with a running double looped #1 PDS  suture.  The subcutaneous tissue was irrigated, and the skin was closed  with staples.   Tolerated the procedure well without any apparent complications and was  taken to the recovery room in satisfactory condition.      Adolph Pollack, M.D.  Electronically Signed     TJR/MEDQ  D:   12/05/2008  T:  12/06/2008  Job:  [id number]   cc:   Everardo All. Madilyn Fireman, M.D.

## 2010-12-27 NOTE — Discharge Summary (Signed)
Jerome Kelley, Jerome Kelley                  ACCOUNT NO.:  [id number]   MEDICAL RECORD NO.:  [id number]          PATIENT TYPE:  INP   LOCATION:  5152                         FACILITY:  [facility]   PHYSICIAN:  Wilmon Arms. Corliss Skains, M.D. DATE OF BIRTH:  1941-01-11   DATE OF ADMISSION:  12/02/2008  DATE OF DISCHARGE:  12/14/2008                               DISCHARGE SUMMARY   OPERATING PHYSICIAN:  Adolph Pollack, MD   CONSULTATIONS:  Everardo All. Madilyn Fireman, MD, Gastroenterology   CHIEF COMPLAINT AND REASON FOR ADMISSION:  Jerome Kelley is a 70 year old  patient who presented with a 4-day history abdominal distention, nausea,  vomiting, and abdominal pain.  Has not eaten for 2 days, only clear  liquids.  He noted 4 months ago, he required multiple use of laxatives  and stool softeners because of similar episode of bloating, but that  resolved without any medical therapy of an over-the-counter sort.  He  has no other reported medical problems and does not take any medications  regular.   PHYSICAL EXAMINATION:  VITAL SIGNS:  Stable.  He was afebrile.  ABDOMEN:  Soft, very distended, nontender with hypoactive bowel sounds.   LABORATORY DATA:  CT of the chest demonstrates an apical lesion in the  cephalic flexure with no signs of metastasis to the liver.  There is a 2-  cm mass in the lung.  On his abdominal CT, he has a large lesion at the  hepatic flexure causing a high-grade obstruction.  Therefore, the  patient was admitted by Dr. Donell Beers with a probable obstructing colon  cancer, questionable mass to the lung.   HOSPITAL COURSE:  The patient was admitted, placed on n.p.o. status, IV  fluids and NG tube was placed, IV pain medications as indicated.  GI  consult was obtained with Dr. Madilyn Fireman who felt the patient would benefit  from colonoscopy.  The patient was found to have a high-grade small and  partial proximal large bowel obstruction secondary to an apple core  lesion and hepatic flexure that extended  over 4 cm.  A cautious bowel  prep was initiated and on December 04, 2008, the patient underwent a  colonoscopy that showed an obstructing descending colon mass consistent  with carcinoma, several small polyps which only one was removed, and  sigmoid and descending diverticulosis.  Pathology from the colonoscopy  did reveal high-grade glandular dysplasia.  The colon polyp was  adenomatous with focal high-grade glandular dysplasia without any  invasive carcinoma.  Given the appearance of the lesion, it was felt  that we needed to proceed with surgical intervention.   On December 05, 2008, the patient was taken to the OR by Dr. Abbey Chatters  where he underwent a right hemicolectomy for an obstructing right colon  neoplasm.  This was an open procedure with a midline incision.  Subsequent pathology from the surgery did return back positive with  colonic adenocarcinoma.  The mass itself measured 5.3 cm with focal  ulceration extending into the pericolonic connective tissue.  All 16  lymph nodes that were removed were benign giving him TNM  code of [id number].   In the immediate postoperative period, the patient's main issues were  related to a postoperative ileus which required NG tube for several  days.  Eventually, the NG tube was removed.  The patient did develop  some redness around the distal portion of his wound and a few staples  were removed.  This had some serous and oily and some cloudy, bloody  drainage which subsequent cultures were negative.  At this point, the  patient's ileus began to improve.  He began passing flatus.  Diet was  advanced and by postop day #8, he was having bowel movements.  His white  cell count was normal.  He was afebrile.  His vital signs were stable.  He was started on a regular diet and plans were to discharge the patient  home within the next 24 hours.  If he did well, continue normal saline  packing to the open abdominal wound after discharge and followup with   Oncology after discharge.   On postop day #9, the patient was afebrile and vital signs were stable.  He tolerated a regular diet for 24 hours.  His most recent laboratory  work was done on Dec 12, 2008, which showed a sodium of 138, potassium  3.5, CO2 22, BUN 5, and creatinine 0.65.  White count 6500, hemoglobin  11.7, and platelets 189,000.  The patient's CEA which had been checked  preoperatively was normal at 2.0.  On exam, the patient's abdomen was  much better with the prior redness and the distal portion of the wound  had cleared after opening the wound.  He still had staples in place and  these were removed on date of discharge with Steri-Strips applied.   FINAL DISCHARGE DIAGNOSES:  1. High-grade small and partial colon obstruction secondary to hepatic      flexure and colon cancer.  2. Status post exploratory laparotomy with right hemicolectomy for 5.4      cm adenocarcinoma with zero lymph nodes positive.  3. Postoperative ileus, resolved.   DISCHARGE MEDICATIONS:  The patient was not on any medications prior to  admission.  The only medications he will be taking is Percocet 5/325 one  to two tablets every 4 hours as needed for pain.   RETURN TO WORK:  In 5 weeks, note given, final determination per Dr.  Abbey Chatters.   ACTIVITY:  Increase activity slowly.  May walk up steps.  May shower.  No lifting greater than 15 pounds for 5 weeks and driving for 2 weeks.   WOUND CARE:  Normal saline packing to the open area of the wound b.i.d.  Home health RN to follow.   FOLLOWUP APPOINTMENT:  He is to call Dr. Maris Berger office at [phone]  to be seen in 2 weeks.  In addition, if need to see the oncologist, this  appointment will be arranged per Dr. Maris Berger office when you follow  up with him.      Allison L. Rondel Jumbo. Tsuei, M.D.  Electronically Signed    ALE/MEDQ  D:  12/14/2008  T:  12/15/2008  Job:  [id number]   cc:   Adolph Pollack,  M.D.  John C. Madilyn Fireman, M.D.

## 2010-12-27 NOTE — Letter (Signed)
January 12, 2010   Rose Phi. Myna Hidalgo, MD  [address] [facility] [address]  [place], [place] [address]   Re:  Jerome Kelley, PATALANO                  DOB:  May 27, 1941   Dear Cindee Lame,   I saw the patient back today.  His chest x-ray is stable.  It shows  normal postoperative changes.  His blood pressure is 140/73, pulse 70,  respirations 16, sats were 98%.  He has some mild chest pain.  Incisions  are well healed.  His lungs are clear to auscultation and percussion.  I  will refer him back to you for follow up.  I will see him again in 2  months with a chest x-ray   [name], M.D.  Electronically Signed   DPB/MEDQ  D:  01/12/2010  T:  01/13/2010  Job:  [id number]

## 2010-12-27 NOTE — Letter (Signed)
November 17, 2009   Rose Phi. Myna Hidalgo, M.D.  [address]  [place] [address]   Re:  Jerome, DEBOLD                  DOB:  05-Oct-1940   Dear [name],   I appreciate the opportunity of seeing the patient.  This patient had a  right VATS, right lower lobe superior segmentectomy with node dissection  for stage IA cancer.  He is doing well.  Removed his chest tube sutures.  His blood pressure is 142/71, pulse 68, respirations 18, saturations  were 96%.  Chest x-ray shows some reaction from his surgery but this  improved and we will plan to see him back again in 3 weeks with a chest  x-ray.   [name], M.D.  Electronically Signed   DPB/MEDQ  D:  11/17/2009  T:  11/18/2009  Job:  [id number]

## 2010-12-27 NOTE — Letter (Signed)
December 08, 2009   Rose Phi. Myna Hidalgo, MD  [address] [facility] [address]  [place], [place] [address]   Re:  MELITON, SAMAD                  DOB:  Jun 26, 1941   Dear [name],   I saw the patient back today.  His blood pressure is 128/68, pulse 78,  respirations 18,  sats were 98%.  His incisions are well healed.  His  chest x-ray is stable.  He is still having some pain, but this is  gradually improving.  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  I  will see him back again in 6 weeks and make sure he gets back for  followup to see you.   I appreciate the opportunity of seeing the patient.   Sincerely,   [name], M.D.  Electronically Signed   DPB/MEDQ  D:  12/08/2009  T:  12/09/2009  Job:  [id number]

## 2010-12-27 NOTE — Op Note (Signed)
Jerome Kelley, Jerome Kelley                  ACCOUNT NO.:  [id number]   MEDICAL RECORD NO.:  [id number]          PATIENT TYPE:  INP   LOCATION:                               FACILITY:  [facility]   PHYSICIAN:  John C. Madilyn Fireman, M.D.    DATE OF BIRTH:  Apr 01, 1941   DATE OF PROCEDURE:  12/04/2008  DATE OF DISCHARGE:                               OPERATIVE REPORT   Colonoscopy with biopsy.   INDICATIONS FOR PROCEDURE:  Obstructing mass lesion on CT scan with  presentation of near total colonic obstruction.   PROCEDURE:  The patient was placed in the left lateral decubitus  position and placed on the pulse monitor with continuous low-flow oxygen  delivered by nasal cannula.  He was sedated with 100 mcg IV fentanyl and  10 mg IV Versed.  Olympus video colonoscope was inserted into the rectum  and advanced to about 110 cm where a circumferential apple core mass  lesion with a tiny pinhole that would not admit the scope in the center  consistent with lesions seen on CT.  It had a typical appearance of  carcinoma.  Two biopsies were taken.  There were two 1-cm sessile polyps  within 20 cm distally of it.  These were not removed feeling that they  would likely be including in the surgical resection margin when surgery  was performed.  There were descending and sigmoid diverticulosis and a  descending colon polyp approximately 1.5 cm diameter which was snared  and sent in a separate specimen container.  The prep was suboptimal, and  I could not rule out other small polyps less than 1-1.5 cm in diameter.  The scope was then withdrawn, and the patient returned to the recovery  room in stable condition.  He tolerated the procedure well, and there  were no immediate complications.   IMPRESSION:  1. Obstructing descending colon mass consistent with carcinoma.  2. Several small polyps, only one of which was removed.  3. Sigmoid and descending diverticulosis.   PLAN:  We will inform Surgery, and we will consider  early colonoscopy in  approximately 6 months after surgery rather than customary 1 year due to  retained polyps.           ______________________________  Everardo All. Madilyn Fireman, M.D.     JCH/MEDQ  D:  12/04/2008  T:  12/05/2008  Job:  [id number]   cc:   [facility].

## 2010-12-27 NOTE — Op Note (Signed)
NAMEZAEDEN, LASTINGER                  ACCOUNT NO.:  [id number]   MEDICAL RECORD NO.:  [id number]          PATIENT TYPE:  AMB   LOCATION:  SDS                          FACILITY:  [facility]   PHYSICIAN:  Adolph Pollack, M.D.DATE OF BIRTH:  1941-04-20   DATE OF PROCEDURE:  02/18/2009  DATE OF DISCHARGE:  02/18/2009                               OPERATIVE REPORT   PREOPERATIVE DIAGNOSIS:  Colon cancer.   POSTOPERATIVE DIAGNOSIS:  Colon cancer.   PROCEDURE:  Insertion of Port-A-Cath with ultrasound guidance and under  fluoroscopy.   SURGEON:  Adolph Pollack, MD   ANESTHESIA:  Local (lidocaine) with MAC.   INDICATIONS:  This 70 year old male has colon cancer and requires long-  term access for chemotherapy and thus presents now for that.   TECHNIQUE:  He was brought to the operating room, placed supine on the  operating room table, and given intravenous sedation.  I then identified  the area of the right internal jugular vein, and using ultrasound, I  mapped out the right internal jugular vein.  The neck and chest were  then sterilely prepped and draped.  Local anesthetic was infiltrated  over the marking from the internal jugular vein, and using a 22-gauge  needle, I located the vein.  I then stuck the 16-gauge needle into the  vein and thread the wire through the needle into the superior vena cava,  verified by fluoroscopy.   Following this, I made an incision around the wire.  I then anesthetized  an area on the right upper lateral chest wall of superficial and deep  and then made an incision through skin and subcutaneous tissue.  Using  electrocautery, I made a pocket for the port.  I then anesthetized a  tract between the superior and inferior incisions.  I passed the  catheter from the inferior up to the superior incision.  A dilator  introducer complex was then placed over the wire and into the superior  vena cava, verified by fluoroscopy.  I then removed the dilator and  wire  and threaded the catheter through the peel-away sheath introducer.  The  peel-away sheath introducer was then removed.   I aspirated blood and flush from the catheter.  The catheter was then  pulled back, so that its tip was near the superior vena cava right  atrial junction.  I then cut the catheter and attached it to the port.  I then sewed the port to the chest wall with interrupted Vicryl sutures.  Hemostasis was adequate in the port incision.  I then closed the port  site incision in 2 layers.  The subcutaneous tissue was closed over the  fourth running 3-0 Vicryl suture.  The skin was closed with 4-0 Monocryl  subcuticular stitch.  The neck incision was then closed with 4-0  Monocryl subcuticular stitch.  I accessed the port and was able to  aspirate blood and flush easily.  Concentrated heparin solution was then  placed in the port.   Following this, the Steri-Strips and sterile dressings were applied.   He tolerated the  procedure without any apparent complications and was  taken to the recovery in satisfactory condition. A portable chest x-ray  was ordered.      Adolph Pollack, M.D.  Electronically Signed     TJR/MEDQ  D:  02/18/2009  T:  02/19/2009  Job:  [id number]

## 2010-12-27 NOTE — Letter (Signed)
October 27, 2009   Rose Phi. Myna Hidalgo, MD  [address] [facility] [address]  [place], [place] [address]   Re:  URAL, ACREE                  DOB:  12-24-1940   Dear [name],   I  appreciate the opportunity of seeing the patient.  This patient  underwent a right colectomy in December 05, 2008 for colorectal cancer, it  was 5.3 cm in size.  He apparently had postoperative chemotherapy.  At  the time of the original resection, there was a lesion in the superior  segment of the right lower lobe and followup CT scan this time revealed  that this had increased in size from 5.6 to 10 mm with a uptake value of  2.3 and also some small lymph nodes with low uptake value.  He is  referred here for evaluation.  He smokes one pack a day and has been  doing that for many years.  He has had no hemoptysis, fever, chills, or  excessive sputum.   PAST MEDICAL HISTORY:  He is on no meds and has no allergies.   FAMILY HISTORY:  Noncontributory.   SOCIAL HISTORY:  He is married, one child, comes in today with his wife.  He is retired.  Smokes one pack a day and has been counseled to stop  smoking.  Does not drink alcohol on a regular basis.   REVIEW OF SYSTEMS:  VITAL SIGNS:  He is 175 pounds.  He is 5 feet 8  inches.  GENERAL:  His weight has been stable.  CARDIAC:  No angina or atrial fibrillation.  PULMONARY:  No hemoptysis, bronchitis, or asthma.  GI:  See history of present illness.  GU:  No kidney disease, dysuria, or frequent urination.  VASCULAR:  No claudication, DVT, or TIAs.  NEUROLOGICAL:  No dizziness, headaches, blackouts, or seizures.  MUSCULOSKELETAL:  Arthritis.  PSYCHIATRIC:  No depression or nervousness.  EYE/ENT:  No changes in eyesight or hearing.  HEMATOLOGICAL:  No problems with bleeding, clotting disorders, or  anemia.   PHYSICAL EXAMINATION:  General:  He is a well-developed Caucasian male  in no acute distress.  Head, Eyes, Ears, Nose, and Throat:  Unremarkable.  Neck:  Supple  without thyromegaly.  There is no  supraclavicular or axillary adenopathy.  Chest:  Clear to auscultation  and percussion.  Heart:  Regular sinus rhythm.  No murmurs.  Abdomen:  Soft.  There is no hepatosplenomegaly.  Extremities:  Pulses are 2+.  There is no clubbing or edema.  Neurological:  He is oriented x3.  Sensory and motor intact.  Cranial nerves intact.  As far as his  surgical scars, he has got an abdominal surgical scar.   I think that this is very worrisome for probably a metastatic colon  cancer since it was there over a year ago, but since it has increased in  size and has uptake value of greater than 2, I would recommend a  resection with a right lower lobe superior segmentectomy.  At that time,  we will sample the lymph nodes in the subcarinal area as well as in the  right peritracheal area.  I will let you know what our findings are.  We  plan to do this on the 28th at [facility].  Assuming that his  pulmonary function test was satisfactory, these will be obtained in the  next week.  I appreciate the opportunity  of seeing the patient.   Sincerely,   [name], M.D.  Electronically Signed   DPB/MEDQ  D:  10/27/2009  T:  10/28/2009  Job:  [id number]   cc:   Adolph Pollack, M.D.

## 2010-12-27 NOTE — Assessment & Plan Note (Signed)
OFFICE VISIT   MYAN, SUIT M  DOB:  October 06, 1940                                        March 23, 2010  CHART #:  [id number]   HISTORY:  The patient is status post right VATS, right lower lobe  superior segmentectomy with node dissection done by Dr. Edwyna Shell, November 08, 2009.  This came back showing poorly differentiated carcinoma  associated with tumor necrosis.  The patient was last seen in the office  by Dr. Edwyna Shell on June 2011.  He presents back today for 46-month followup  with chest x-ray.  The patient states he is feeling extremely well.  He  denies any pain, shortness of breath, hemoptysis, or wheezing.  He has a  followup CT scan ordered for September.  He does have history of  adenocarcinoma of the colon status post right colectomy and he finished  12 months of chemo in December 2010.   PHYSICAL EXAMINATION:  Vital Signs:  Blood pressure of 133/75, pulse 60,  respirations 16, and O2 sats 96% on room air.  Respiratory:  Clear to  auscultation bilaterally.  Cardiac:  Regular rate and rhythm.  Incisions:  All healing well.   STUDIES:  The patient had a PA and lateral chest x-ray obtained today  which shows no significant change in appearance of bilateral pleural  parenchymal scarring with no active disease.   IMPRESSION AND PLAN:  The patient was seen and evaluated by Dr. Edwyna Shell.  The patient is noted to be progressing well postoperatively.  He is to  continue ambulating several times per  day.  We will plan to see the patient back after his CT scan done in  September 2011.  The patient is told in the interim if he has any  surgical questions, he is to contact us.   [name], M.D.  Electronically Signed   KMD/MEDQ  D:  03/23/2010  T:  03/24/2010  Job:  [id number]   cc:   Rose Phi. Myna Hidalgo, M.D.  Adolph Pollack, M.D.

## 2010-12-29 ENCOUNTER — Other Ambulatory Visit: Payer: Self-pay | Admitting: Hematology & Oncology

## 2010-12-29 ENCOUNTER — Encounter (HOSPITAL_BASED_OUTPATIENT_CLINIC_OR_DEPARTMENT_OTHER): Payer: Medicare Other | Admitting: Hematology & Oncology

## 2010-12-29 DIAGNOSIS — C182 Malignant neoplasm of ascending colon: Secondary | ICD-10-CM

## 2010-12-29 DIAGNOSIS — C3490 Malignant neoplasm of unspecified part of unspecified bronchus or lung: Secondary | ICD-10-CM

## 2010-12-29 DIAGNOSIS — C3430 Malignant neoplasm of lower lobe, unspecified bronchus or lung: Secondary | ICD-10-CM

## 2011-05-01 ENCOUNTER — Other Ambulatory Visit: Payer: Self-pay | Admitting: Hematology & Oncology

## 2011-05-01 ENCOUNTER — Encounter (HOSPITAL_BASED_OUTPATIENT_CLINIC_OR_DEPARTMENT_OTHER): Payer: Medicare Other | Admitting: Hematology & Oncology

## 2011-05-01 DIAGNOSIS — C3430 Malignant neoplasm of lower lobe, unspecified bronchus or lung: Secondary | ICD-10-CM

## 2011-05-01 DIAGNOSIS — C182 Malignant neoplasm of ascending colon: Secondary | ICD-10-CM

## 2011-05-01 LAB — CBC WITH DIFFERENTIAL (CANCER CENTER ONLY)
BASO#: 0 10*3/uL (ref 0.0–0.2)
BASO%: 0.4 % (ref 0.0–2.0)
EOS%: 3.1 % (ref 0.0–7.0)
Eosinophils Absolute: 0.2 10*3/uL (ref 0.0–0.5)
HCT: 42 % (ref 38.7–49.9)
HGB: 15.7 g/dL (ref 13.0–17.1)
LYMPH#: 1.3 10*3/uL (ref 0.9–3.3)
LYMPH%: 25.2 % (ref 14.0–48.0)
MCH: 35.6 pg — ABNORMAL HIGH (ref 28.0–33.4)
MCHC: 37.4 g/dL — ABNORMAL HIGH (ref 32.0–35.9)
MCV: 95 fL (ref 82–98)
MONO#: 0.5 10*3/uL (ref 0.1–0.9)
MONO%: 9.4 % (ref 0.0–13.0)
NEUT#: 3.1 10*3/uL (ref 1.5–6.5)
NEUT%: 61.9 % (ref 40.0–80.0)
Platelets: 87 10*3/uL — ABNORMAL LOW (ref 145–400)
RBC: 4.41 10*6/uL (ref 4.20–5.70)
RDW: 14.6 % (ref 11.1–15.7)
WBC: 5.1 10*3/uL (ref 4.0–10.0)

## 2011-05-01 LAB — COMPREHENSIVE METABOLIC PANEL
ALT: 11 U/L (ref 0–53)
AST: 15 U/L (ref 0–37)
Albumin: 4 g/dL (ref 3.5–5.2)
Alkaline Phosphatase: 101 U/L (ref 39–117)
BUN: 11 mg/dL (ref 6–23)
CO2: 28 mEq/L (ref 19–32)
Calcium: 9 mg/dL (ref 8.4–10.5)
Chloride: 107 mEq/L (ref 96–112)
Creatinine, Ser: 0.86 mg/dL (ref 0.50–1.35)
Glucose, Bld: 97 mg/dL (ref 70–99)
Potassium: 3.7 mEq/L (ref 3.5–5.3)
Sodium: 142 mEq/L (ref 135–145)
Total Bilirubin: 0.8 mg/dL (ref 0.3–1.2)
Total Protein: 6.5 g/dL (ref 6.0–8.3)

## 2011-05-01 LAB — CEA: CEA: 0.9 ng/mL (ref 0.0–5.0)

## 2011-06-28 ENCOUNTER — Ambulatory Visit (HOSPITAL_BASED_OUTPATIENT_CLINIC_OR_DEPARTMENT_OTHER): Payer: Medicare Other | Admitting: Lab

## 2011-06-28 ENCOUNTER — Other Ambulatory Visit: Payer: Self-pay | Admitting: Hematology & Oncology

## 2011-06-28 ENCOUNTER — Ambulatory Visit (INDEPENDENT_AMBULATORY_CARE_PROVIDER_SITE_OTHER)
Admission: RE | Admit: 2011-06-28 | Discharge: 2011-06-28 | Disposition: A | Discharge status: Home / Self Care | Payer: Medicare Other | Source: Ambulatory Visit | Attending: Hematology & Oncology | Admitting: Hematology & Oncology

## 2011-06-28 ENCOUNTER — Ambulatory Visit (HOSPITAL_BASED_OUTPATIENT_CLINIC_OR_DEPARTMENT_OTHER): Payer: Medicare Other

## 2011-06-28 ENCOUNTER — Ambulatory Visit (HOSPITAL_BASED_OUTPATIENT_CLINIC_OR_DEPARTMENT_OTHER)
Admission: RE | Admit: 2011-06-28 | Discharge: 2011-06-28 | Disposition: A | Discharge status: Home / Self Care | Payer: Medicare Other | Source: Ambulatory Visit | Attending: Hematology & Oncology | Admitting: Hematology & Oncology

## 2011-06-28 ENCOUNTER — Telehealth: Payer: Self-pay | Admitting: Hematology & Oncology

## 2011-06-28 DIAGNOSIS — Z452 Encounter for adjustment and management of vascular access device: Secondary | ICD-10-CM

## 2011-06-28 DIAGNOSIS — C3490 Malignant neoplasm of unspecified part of unspecified bronchus or lung: Secondary | ICD-10-CM

## 2011-06-28 DIAGNOSIS — C182 Malignant neoplasm of ascending colon: Secondary | ICD-10-CM

## 2011-06-28 DIAGNOSIS — M5146 Schmorl's nodes, lumbar region: Secondary | ICD-10-CM | POA: Insufficient documentation

## 2011-06-28 DIAGNOSIS — I2510 Atherosclerotic heart disease of native coronary artery without angina pectoris: Secondary | ICD-10-CM

## 2011-06-28 DIAGNOSIS — C189 Malignant neoplasm of colon, unspecified: Secondary | ICD-10-CM

## 2011-06-28 DIAGNOSIS — R161 Splenomegaly, not elsewhere classified: Secondary | ICD-10-CM

## 2011-06-28 DIAGNOSIS — C186 Malignant neoplasm of descending colon: Secondary | ICD-10-CM

## 2011-06-28 DIAGNOSIS — C3430 Malignant neoplasm of lower lobe, unspecified bronchus or lung: Secondary | ICD-10-CM

## 2011-06-28 LAB — BASIC METABOLIC PANEL - CANCER CENTER ONLY
BUN, Bld: 10 mg/dL (ref 7–22)
CO2: 29 mEq/L (ref 18–33)
Calcium: 9 mg/dL (ref 8.0–10.3)
Chloride: 100 mEq/L (ref 98–108)
Creat: 0.7 mg/dl (ref 0.6–1.2)
Glucose, Bld: 98 mg/dL (ref 73–118)
Potassium: 4.7 mEq/L (ref 3.3–4.7)
Sodium: 140 mEq/L (ref 128–145)

## 2011-06-28 IMAGING — CT CT ABD-PELV W/ CM
3 of 5 series · 7 of 46 positions shown, 13 images · IV contrast (APPLIED)
Comparison: [DATE].

CT CHEST

CLINICAL DATA: Follow up of colon and lung cancer.  Colon cancer
diagnosed in [82].  Surgery chemotherapy.  Lung cancer diagnosed in
[82].  Surgery.  No acute complaints.

CT CHEST, ABDOMEN AND PELVIS WITH CONTRAST
TECHNIQUE: Contiguous axial images of the chest abdomen and pelvis
were obtained after IV contrast administration.
Contrast: 100  ml [82]

[Series 5: chest/abd/pel 3.0 coronal · coronal · 0.89mm/px · 3 of 89 slices shown, 4 images]
[im 30/89  soft-tissue]
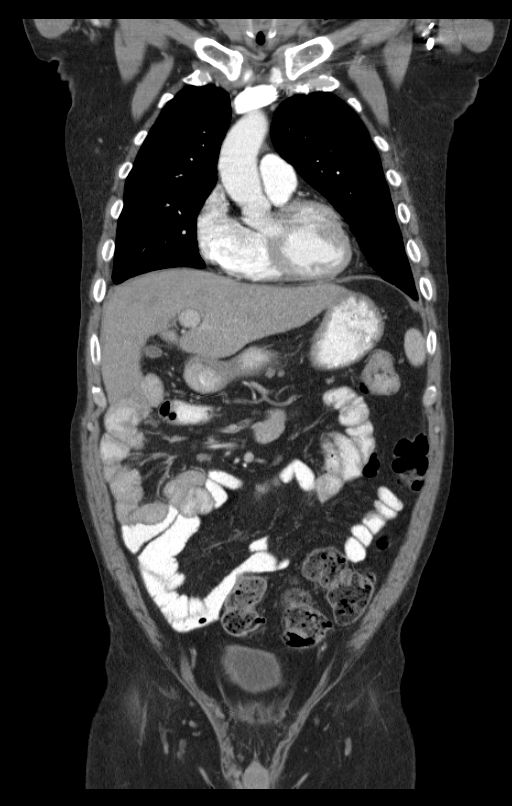
[im 40/89  soft-tissue]
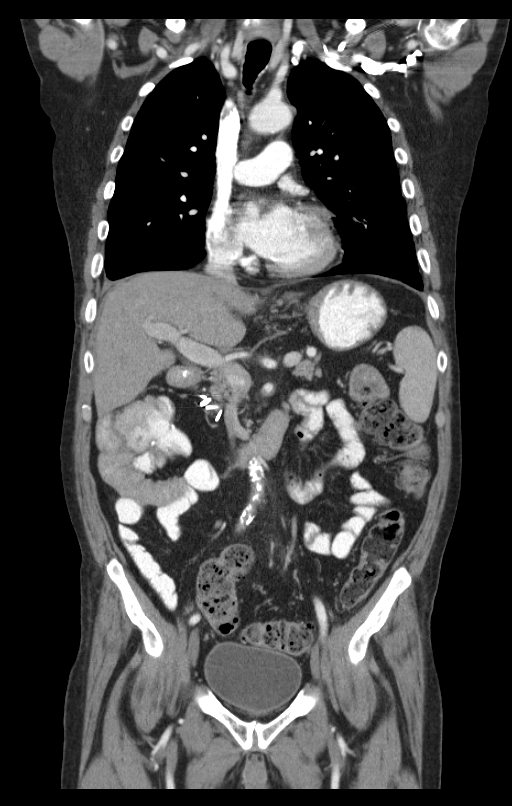
[im 40/89  bone]
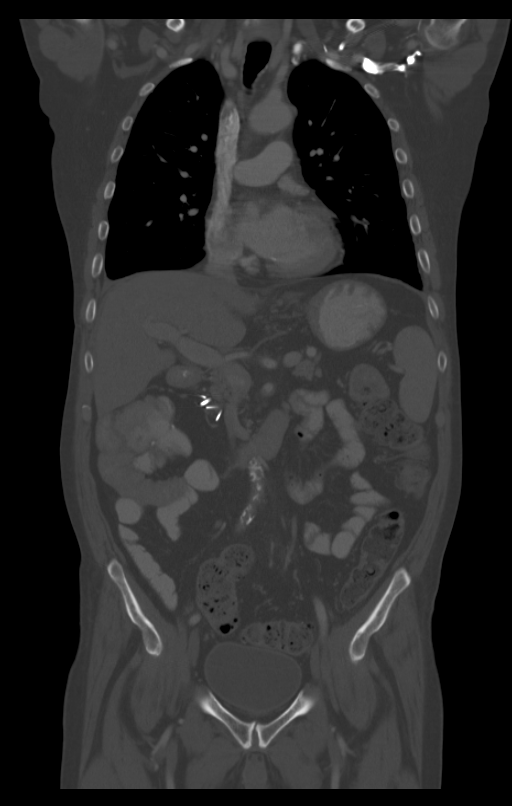
[im 49/89  soft-tissue]
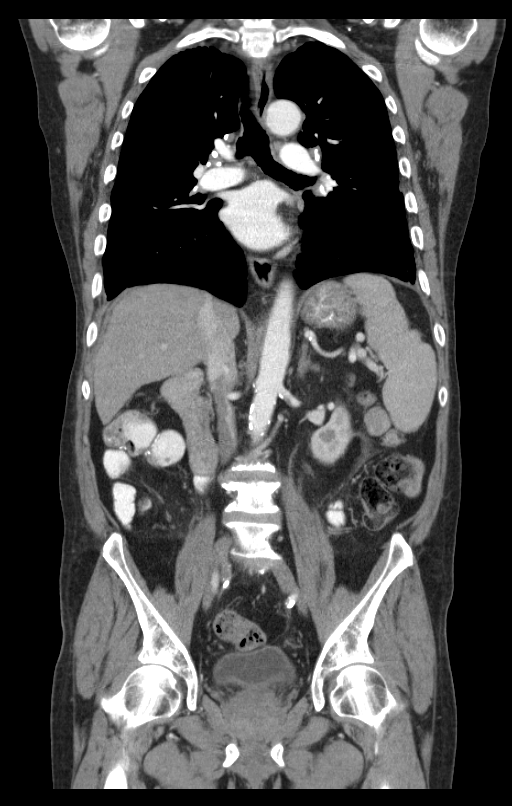

[Series 6: chest/abd/pel 3.0 sagittal · sagittal · 0.69mm/px · 1 of 127 slices shown, 2 images]
[im 43/127  soft-tissue]
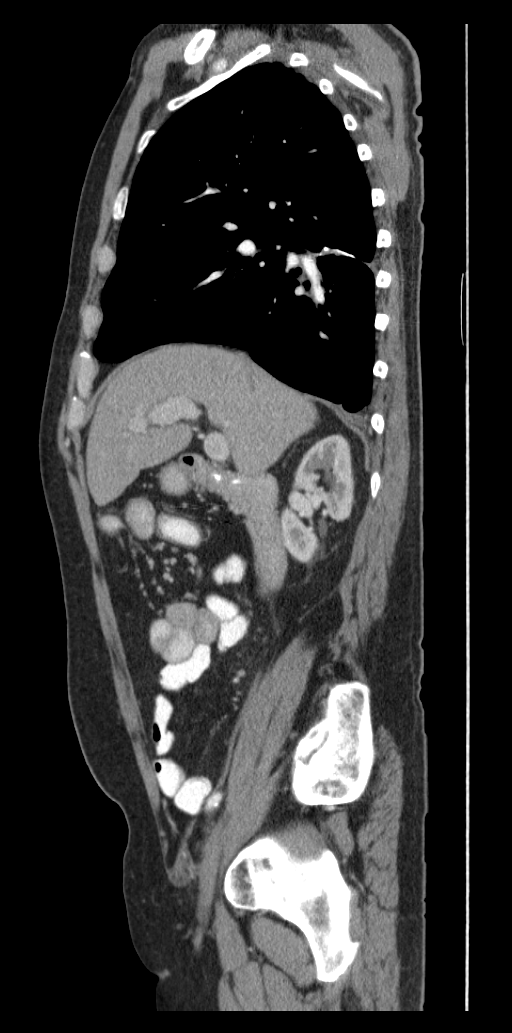
[im 43/127  bone]
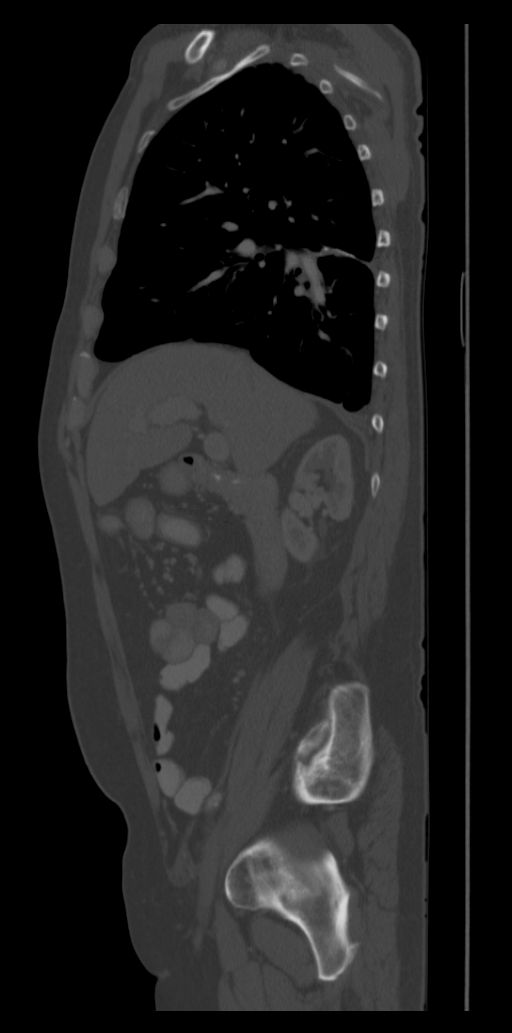

[Series 7: renal delay 5.0 b30f · axial · delayed · 0.97mm/px · z∈[-412,-332]mm · 3 of 32 slices shown, 7 images]
[im 8/32  soft-tissue]
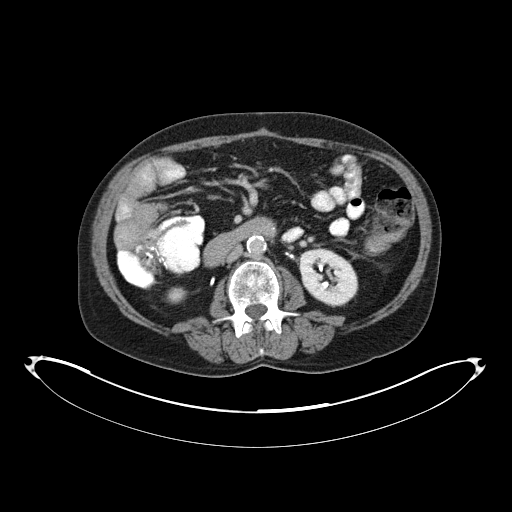
[im 8/32  lung]
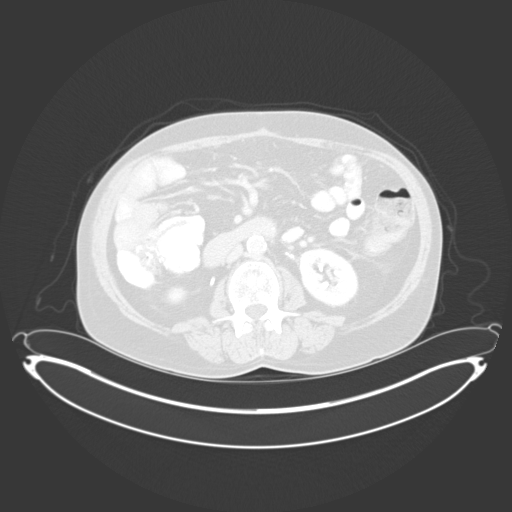
[im 8/32  bone]
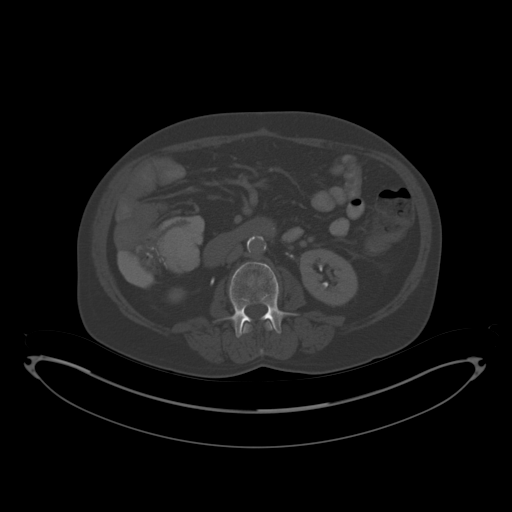
[im 16/32  soft-tissue]
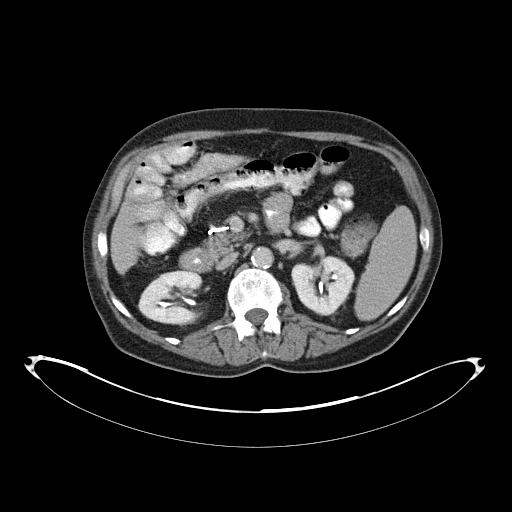
[im 16/32  lung]
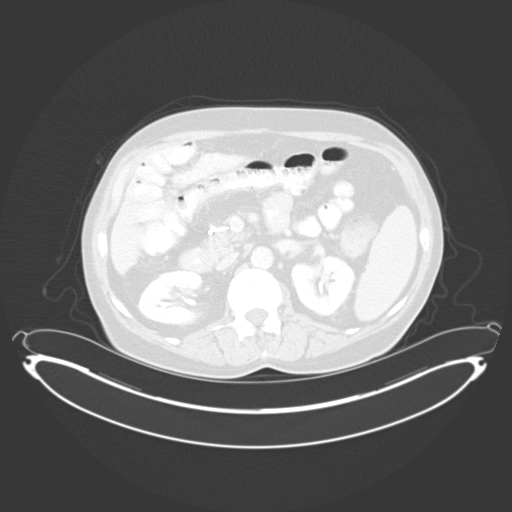
[im 24/32  soft-tissue]
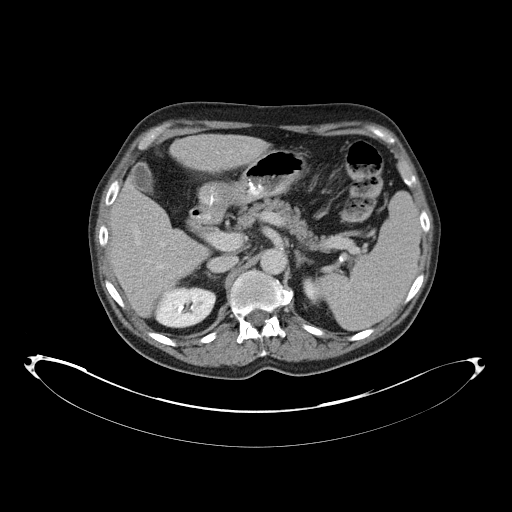
[im 24/32  lung]
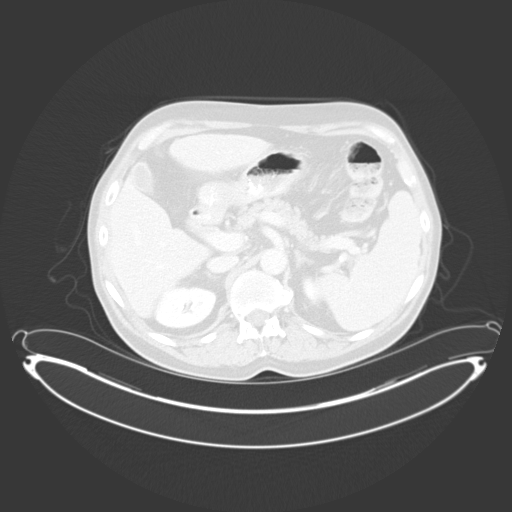

[7 of 46 positions shown; findings below may reference images not displayed]

FINDINGS: Lung windows demonstrate similar appearance of surgical
changes and resultant scarring in the right lower lobe.
No evidence of locally recurrent disease.
Volume loss in the left lung base is unchanged.

Soft tissue windows demonstrate heart size upper normal, without
pericardial effusion.  There is left-sided pleural thickening and
pleural calcifications, unchanged.  Minimal right-sided pleural
thickening.

Multivessel coronary artery atherosclerosis.  No central pulmonary
embolism, on this non-dedicated study.  Calcified mediastinal and
bilateral hilar lymph nodes again identified.  No adenopathy.  Low
density right paraspinal soft tissue nodule measures 1.2 cm on
image 46 and is similar to on the prior exam.  This is present back
to [DATE].
IMPRESSION: 1.  Surgical changes in the right lower lobe without evidence of
recurrent or metastatic disease.
2.  Low density nodule the right paravertebral region is similar
back to [DATE] and favored to represent an area of extramedullary
hematopoiesis.  Differential considerations include a neurogenic
tumor.  Recommend attention on follow-up.
3.  Old granulomatous disease and multivessel coronary artery
atherosclerosis.
4.  Left pleural thickening and calcification suggests prior
empyema or hemothorax.

CT ABDOMEN AND PELVIS
FINDINGS: No focal liver lesions are identified.  The caudate lobe
and lateral segment left lobe are again mildly prominent.  Similar
prominence of the spleen, measuring 14.5 cm cranial caudal.  The
descending duodenum appears thick-walled.  This could be due to
underdistension on image 64.  No surrounding edema.

Normal pancreas, gallbladder, biliary tract, adrenal glands,
kidneys.  Atherosclerosis at the origin of bilateral renal
arteries.  A circumaortic left renal vein. No retroperitoneal or
retrocrural adenopathy.

Colonic stool burden suggests constipation.

Partial right hemicolectomy. Normal small bowel without abdominal
ascites.

No peritoneal or omental disease.

No pelvic adenopathy.  Normal urinary bladder.  Mild
prostatomegaly. No significant free fluid.  No acute osseous
abnormality.  Numerous Schmorl's node deformities within the lumbar
spine.
IMPRESSION: 1. No acute process or evidence of metastatic disease in the
abdomen or pelvis.
2.  As described previously, mild cirrhosis cannot be excluded.
3.  Splenomegaly, possibly secondary to cirrhosis.
4. Possible constipation.
5.  Apparent wall thickening of the descending duodenum, favored to
be due to underdistension and less impressive on the kidney delayed
images.

## 2011-06-28 MED ORDER — IOHEXOL 300 MG/ML  SOLN
100.0000 mL | Freq: Once | INTRAMUSCULAR | Status: AC | PRN
  Administered 2011-06-28: 100 mL via INTRAVENOUS

## 2011-06-28 NOTE — Telephone Encounter (Signed)
Radiology called to clarify if pt needed pelvis added to 11-14 CT. Per Harriett Sine yes faxed new order to them.

## 2011-06-28 NOTE — Progress Notes (Signed)
PAtient here for IV start prior to CT SCan downstairs.  Patient tol procedure well.

## 2011-07-27 ENCOUNTER — Other Ambulatory Visit: Payer: Self-pay | Admitting: Family Medicine

## 2011-07-27 ENCOUNTER — Ambulatory Visit
Admission: RE | Admit: 2011-07-27 | Discharge: 2011-07-27 | Disposition: A | Discharge status: Home / Self Care | Payer: Medicare Other | Source: Ambulatory Visit | Attending: Family Medicine | Admitting: Family Medicine

## 2011-07-27 DIAGNOSIS — J189 Pneumonia, unspecified organism: Secondary | ICD-10-CM

## 2011-07-27 IMAGING — CR DG CHEST 2V
2 series · 2 of 2 positions shown · non-contrast
Comparison: Chest x-ray of [DATE]

CLINICAL DATA: History of recent pneumonia, cough

CHEST - 2 VIEW

[w chest pa]
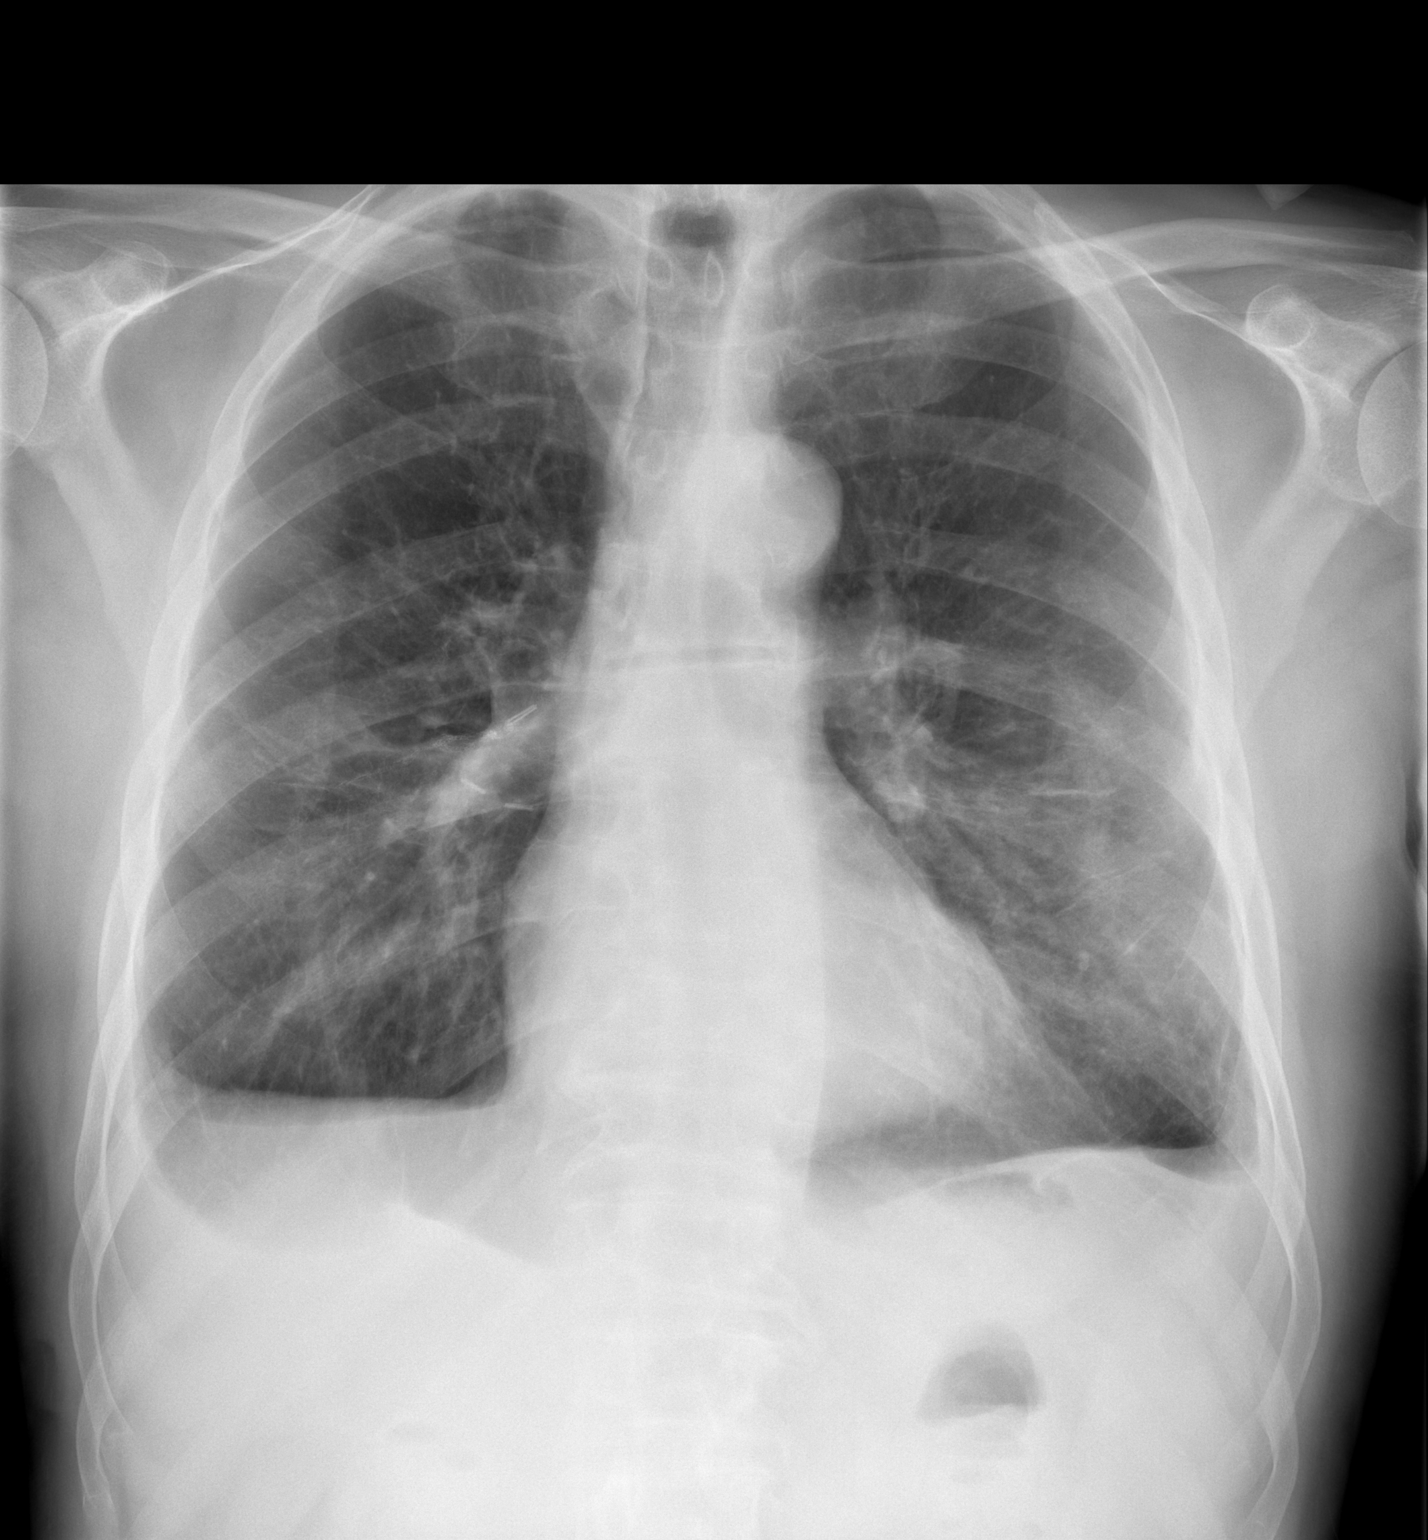

[w chest lat]
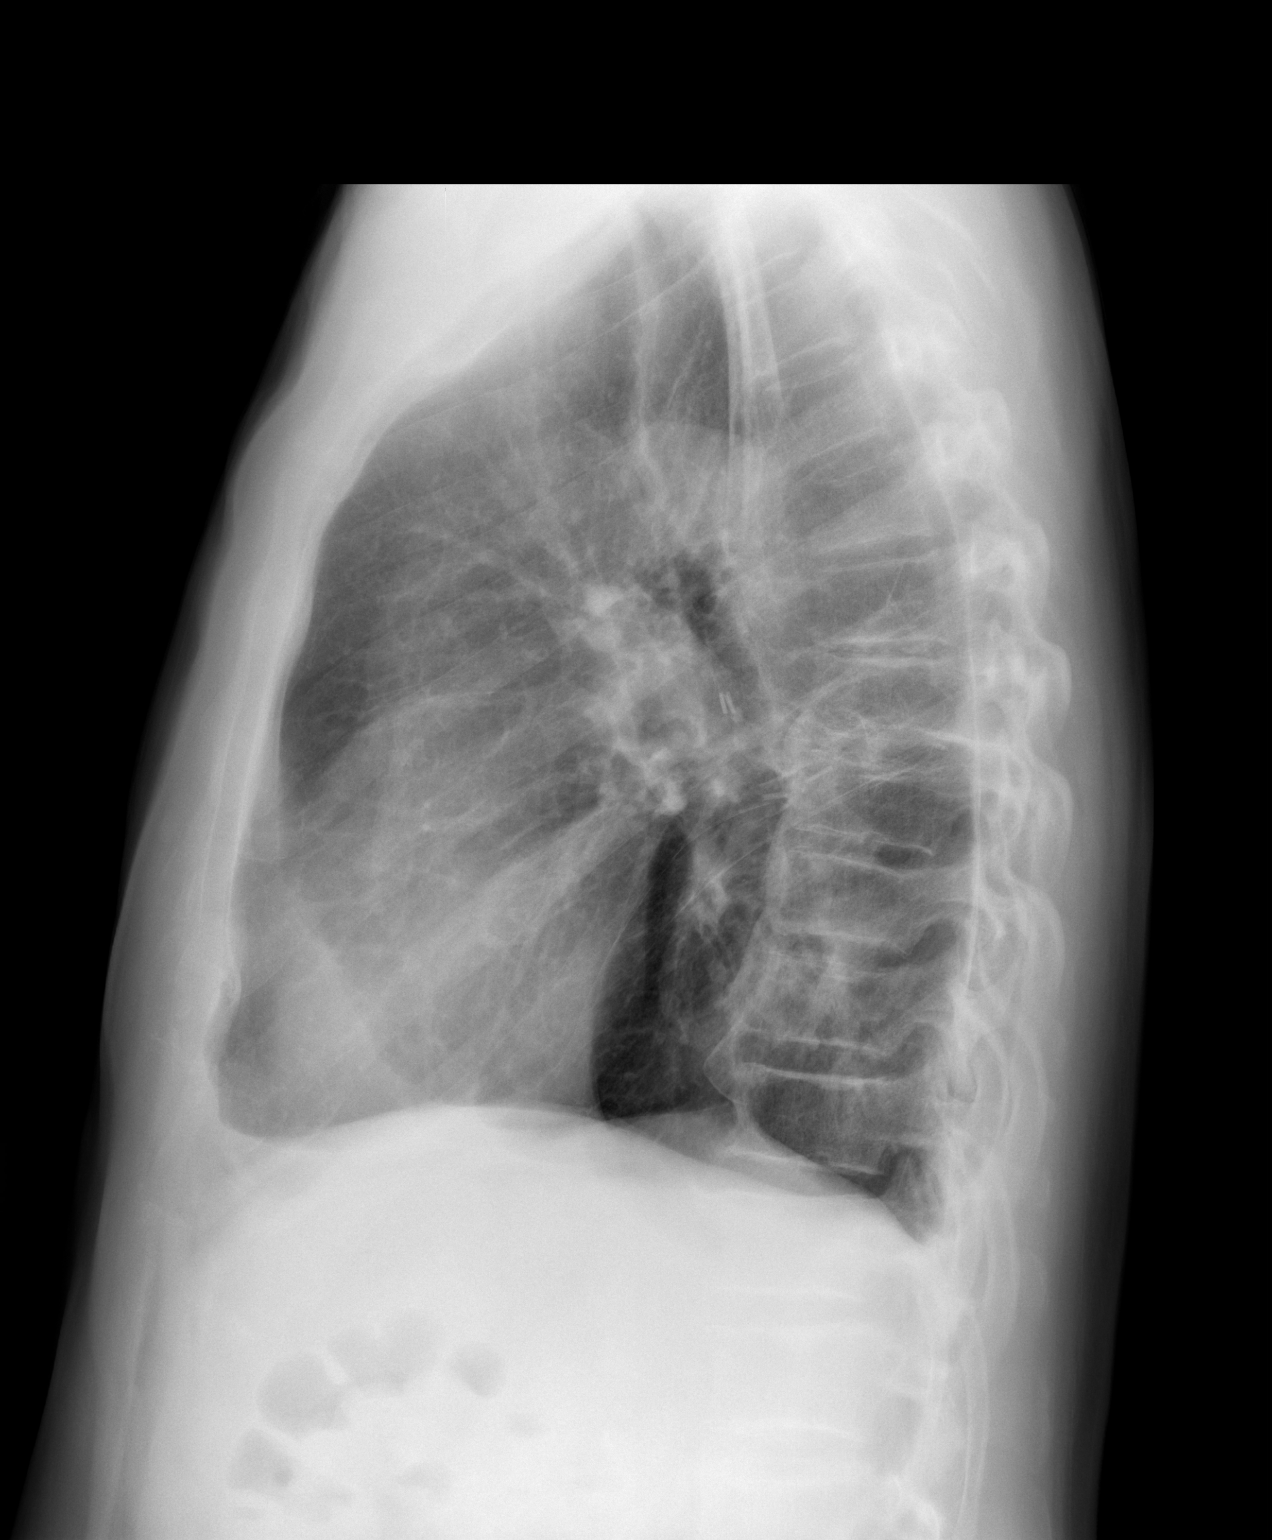

[2 of 2 positions shown; findings below may reference images not displayed]

FINDINGS: Chronic changes at the lung bases appear stable.  No
focal infiltrate or effusion is seen.  Linear scarring in both
midlung fields is stable.  Surgical clips overlie the right hilum.
Mediastinal contours are stable.  The heart is within normal limits
in size.  There are degenerative changes throughout the thoracic
spine.
IMPRESSION: Stable chronic change.  No active lung disease.

## 2011-09-18 ENCOUNTER — Ambulatory Visit (HOSPITAL_BASED_OUTPATIENT_CLINIC_OR_DEPARTMENT_OTHER): Payer: Medicare Other | Admitting: Hematology & Oncology

## 2011-09-18 ENCOUNTER — Other Ambulatory Visit (HOSPITAL_BASED_OUTPATIENT_CLINIC_OR_DEPARTMENT_OTHER): Payer: Medicare Other | Admitting: Lab

## 2011-09-18 ENCOUNTER — Encounter: Payer: Self-pay | Admitting: Hematology & Oncology

## 2011-09-18 DIAGNOSIS — C182 Malignant neoplasm of ascending colon: Secondary | ICD-10-CM | POA: Diagnosis not present

## 2011-09-18 DIAGNOSIS — C3410 Malignant neoplasm of upper lobe, unspecified bronchus or lung: Secondary | ICD-10-CM

## 2011-09-18 DIAGNOSIS — C189 Malignant neoplasm of colon, unspecified: Secondary | ICD-10-CM

## 2011-09-18 DIAGNOSIS — C3430 Malignant neoplasm of lower lobe, unspecified bronchus or lung: Secondary | ICD-10-CM | POA: Diagnosis not present

## 2011-09-18 HISTORY — DX: Malignant neoplasm of upper lobe, unspecified bronchus or lung: C34.10

## 2011-09-18 HISTORY — DX: Malignant neoplasm of colon, unspecified: C18.9

## 2011-09-18 LAB — COMPREHENSIVE METABOLIC PANEL
ALT: 10 U/L (ref 0–53)
AST: 13 U/L (ref 0–37)
Albumin: 4.1 g/dL (ref 3.5–5.2)
Alkaline Phosphatase: 96 U/L (ref 39–117)
BUN: 9 mg/dL (ref 6–23)
CO2: 29 mEq/L (ref 19–32)
Calcium: 8.8 mg/dL (ref 8.4–10.5)
Chloride: 107 mEq/L (ref 96–112)
Creatinine, Ser: 0.82 mg/dL (ref 0.50–1.35)
Glucose, Bld: 80 mg/dL (ref 70–99)
Potassium: 4 mEq/L (ref 3.5–5.3)
Sodium: 141 mEq/L (ref 135–145)
Total Bilirubin: 0.6 mg/dL (ref 0.3–1.2)
Total Protein: 6.2 g/dL (ref 6.0–8.3)

## 2011-09-18 LAB — CBC WITH DIFFERENTIAL (CANCER CENTER ONLY)
BASO#: 0 10*3/uL (ref 0.0–0.2)
BASO%: 0.2 % (ref 0.0–2.0)
EOS%: 2.9 % (ref 0.0–7.0)
Eosinophils Absolute: 0.1 10*3/uL (ref 0.0–0.5)
HCT: 39.4 % (ref 38.7–49.9)
HGB: 14.3 g/dL (ref 13.0–17.1)
LYMPH#: 1.1 10*3/uL (ref 0.9–3.3)
LYMPH%: 26.7 % (ref 14.0–48.0)
MCH: 34.6 pg — ABNORMAL HIGH (ref 28.0–33.4)
MCHC: 36.3 g/dL — ABNORMAL HIGH (ref 32.0–35.9)
MCV: 95 fL (ref 82–98)
MONO#: 0.5 10*3/uL (ref 0.1–0.9)
MONO%: 12.6 % (ref 0.0–13.0)
NEUT#: 2.4 10*3/uL (ref 1.5–6.5)
NEUT%: 57.6 % (ref 40.0–80.0)
Platelets: 85 10*3/uL — ABNORMAL LOW (ref 145–400)
RBC: 4.13 10*6/uL — ABNORMAL LOW (ref 4.20–5.70)
RDW: 14.5 % (ref 11.1–15.7)
WBC: 4.2 10*3/uL (ref 4.0–10.0)

## 2011-09-18 LAB — LACTATE DEHYDROGENASE: LDH: 133 U/L (ref 94–250)

## 2011-09-18 LAB — CEA: CEA: 0.7 ng/mL (ref 0.0–5.0)

## 2011-09-18 NOTE — Progress Notes (Signed)
This office note has been dictated.

## 2011-09-18 NOTE — Progress Notes (Signed)
CC:   [name], M.D. Adolph Pollack, M.D. John C. Madilyn Fireman, M.D.  DIAGNOSES: 1. Stage IA (T1 N0 M0) poorly differentiated carcinoma of the right     lung. 2. 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  CURRENT THERAPY:  Observation.  INTERIM HISTORY:  Mr. Scholze comes in for followup.  He is doing quite well.  We last saw him back in September.  His last scans were done back in November.  These do not show any evidence of recurrent disease.  He did have a CT scan of the chest, abdomen and pelvis.  He did have a low density nodule in the right paravertebral region which was similar to March of 2011.  There was some evidence of splenomegaly with cirrhosis.  He is eating okay.  He is having some constipation.  He has not noted any cough.  He has had no headache.  PHYSICAL EXAMINATION:  General:  This is a fairly well-developed, well- nourished white gentleman in no obvious distress.  Vital signs:  97, pulse 57, respiratory rate 16, blood pressure 156/75.  Weight is 177. Head and neck:  Exam shows a normocephalic, atraumatic skull.  There are no ocular or oral lesions.  There are no palpable cervical or supraclavicular lymph nodes.  Lungs:  Are clear bilaterally.  Cardiac: Regular rate and rhythm with a normal S1, S2.  There are no murmurs, rubs or bruits.  Abdomen:  Soft with good bowel sounds.  There is no palpable abdominal mass.  There is no fluid wave.  There is a well- healed laparoscopy scar.  He has no palpable hepatosplenomegaly.  Back: Exam shows the thoracotomy scar in the right lateral chest wall.  This is well-healed.  There is no tenderness over the spine, ribs or hips. Extremities:  Shows no clubbing, cyanosis or edema.  Neurological:  Exam shows no focal neurological deficits.  LABORATORY STUDIES:  White cell count is 4.2, hemoglobin 14.3, hematocrit 39.4, platelet count 85,000.  IMPRESSION:  Mr. Herne is a  71 year old gentleman with history of stage II adenocarcinoma of the ascending colon.  He underwent resection.  He completed adjuvant FOLFOX in January of 2011.  He had a stage IA poorly differentiated carcinoma resected from the right lung in March of 2011.  Again, I do not see that we have to put him through any scans right now. He is asymptomatic.  I want to see him back in 6 more months for followup.  We certainly can get x-rays on him if necessary.  Unfortunately, he is still smoking which definitely is putting him at significant risk for lung cancer recurrence.    ______________________________ Josph Macho, M.D. PRE/MEDQ  D:  09/18/2011  T:  09/18/2011  Job:  [id number]  ADDENDUM:  CEA is 0.7.

## 2011-09-25 ENCOUNTER — Telehealth: Payer: Self-pay | Admitting: *Deleted

## 2011-09-25 NOTE — Telephone Encounter (Signed)
Message copied by Anselm Jungling on Mon Sep 25, 2011 11:50 AM ------      Message from: Arlan Organ R      Created: Thu Sep 21, 2011  6:42 PM       calland tell him that labs are ok.  pete

## 2011-09-25 NOTE — Telephone Encounter (Signed)
Called patient to let him know that his labwork is good per dr. Myna Hidalgo

## 2012-01-26 DIAGNOSIS — I10 Essential (primary) hypertension: Secondary | ICD-10-CM | POA: Diagnosis not present

## 2012-02-26 ENCOUNTER — Ambulatory Visit (HOSPITAL_BASED_OUTPATIENT_CLINIC_OR_DEPARTMENT_OTHER): Payer: Medicare Other | Admitting: Hematology & Oncology

## 2012-02-26 ENCOUNTER — Other Ambulatory Visit (HOSPITAL_BASED_OUTPATIENT_CLINIC_OR_DEPARTMENT_OTHER): Payer: Medicare Other | Admitting: Lab

## 2012-02-26 VITALS — BP 151/70 | HR 55 | Temp 97.0°F | Ht 68.0 in | Wt 178.0 lb

## 2012-02-26 DIAGNOSIS — C189 Malignant neoplasm of colon, unspecified: Secondary | ICD-10-CM | POA: Diagnosis not present

## 2012-02-26 DIAGNOSIS — C182 Malignant neoplasm of ascending colon: Secondary | ICD-10-CM

## 2012-02-26 DIAGNOSIS — C3410 Malignant neoplasm of upper lobe, unspecified bronchus or lung: Secondary | ICD-10-CM

## 2012-02-26 DIAGNOSIS — F17200 Nicotine dependence, unspecified, uncomplicated: Secondary | ICD-10-CM

## 2012-02-26 DIAGNOSIS — C3430 Malignant neoplasm of lower lobe, unspecified bronchus or lung: Secondary | ICD-10-CM | POA: Diagnosis not present

## 2012-02-26 LAB — CBC WITH DIFFERENTIAL (CANCER CENTER ONLY)
BASO#: 0 10*3/uL (ref 0.0–0.2)
BASO%: 0 % (ref 0.0–2.0)
EOS%: 2.6 % (ref 0.0–7.0)
Eosinophils Absolute: 0.1 10*3/uL (ref 0.0–0.5)
HCT: 41.4 % (ref 38.7–49.9)
HGB: 15.1 g/dL (ref 13.0–17.1)
LYMPH#: 1.2 10*3/uL (ref 0.9–3.3)
LYMPH%: 24.5 % (ref 14.0–48.0)
MCH: 35.4 pg — ABNORMAL HIGH (ref 28.0–33.4)
MCHC: 36.5 g/dL — ABNORMAL HIGH (ref 32.0–35.9)
MCV: 97 fL (ref 82–98)
MONO#: 0.5 10*3/uL (ref 0.1–0.9)
MONO%: 10.7 % (ref 0.0–13.0)
NEUT#: 2.9 10*3/uL (ref 1.5–6.5)
NEUT%: 62.2 % (ref 40.0–80.0)
Platelets: 80 10*3/uL — ABNORMAL LOW (ref 145–400)
RBC: 4.27 10*6/uL (ref 4.20–5.70)
RDW: 14 % (ref 11.1–15.7)
WBC: 4.7 10*3/uL (ref 4.0–10.0)

## 2012-02-26 LAB — COMPREHENSIVE METABOLIC PANEL
ALT: 9 U/L (ref 0–53)
AST: 14 U/L (ref 0–37)
Albumin: 3.9 g/dL (ref 3.5–5.2)
Alkaline Phosphatase: 91 U/L (ref 39–117)
BUN: 11 mg/dL (ref 6–23)
CO2: 29 mEq/L (ref 19–32)
Calcium: 9.1 mg/dL (ref 8.4–10.5)
Chloride: 107 mEq/L (ref 96–112)
Creatinine, Ser: 0.85 mg/dL (ref 0.50–1.35)
Glucose, Bld: 102 mg/dL — ABNORMAL HIGH (ref 70–99)
Potassium: 3.9 mEq/L (ref 3.5–5.3)
Sodium: 142 mEq/L (ref 135–145)
Total Bilirubin: 0.7 mg/dL (ref 0.3–1.2)
Total Protein: 6.2 g/dL (ref 6.0–8.3)

## 2012-02-26 LAB — CEA: CEA: 1.2 ng/mL (ref 0.0–5.0)

## 2012-02-26 LAB — LACTATE DEHYDROGENASE: LDH: 128 U/L (ref 94–250)

## 2012-02-26 NOTE — Progress Notes (Signed)
This office note has been dictated.

## 2012-02-27 NOTE — Progress Notes (Signed)
CC:   Adolph Pollack, M.D. John C. Madilyn Fireman, M.D.  DIAGNOSIS: 1. Stage IA poorly differentiated carcinoma of the right lung. 2. History of stage II (T3 N0 M0) adenocarcinoma of the ascending     colon.  CURRENT THERAPY:  Observation.  INTERIM HISTORY:  Ms. Commons comes in for followup.  We basically see him every 6 months.  He is doing okay.  Unfortunately, still smoking. He smokes about a pack a day.  It is hard to say if this will ever change.  His last CEA that we checked back in February was 0.7.  His last scans were done back in November 2012.  There is no evidence of recurrent disease with respect to his CT scans.  He is eating well. There is no problems with his bowels or bladder.  He has had no bleeding.  There has been no leg swelling.  He has had no headache.  He has had no weight loss or weight gain.  PHYSICAL EXAMINATION:  General:  This is a well-developed, well- nourished, white gentleman in no obvious distress.  Vital signs:  97, pulse 55, respiratory rate 20, blood pressure 151/70.  Weight is 178. Head and neck:  Normocephalic, atraumatic skull.  There are no ocular or oral lesions.  No palpable cervical or supraclavicular lymph nodes. Lungs:  Clear to percussion and auscultation bilaterally.  Cardiac: Regular rate and rhythm with a normal S1, S2.  There are no murmurs, rubs or bruits.  Abdomen:  Soft with good bowel sounds.  There is no palpable abdominal mass.  There is no fluid wave.  He has well-healed laparotomy scar.  There is no palpable hepatosplenomegaly.  Back:  No tenderness of the spine, ribs, or hips.  He has well-healed right lateral thoracotomy scar.  Extremities:  Shows no clubbing, cyanosis or edema.  Neurological:  Shows no focal neurological deficits.  LABORATORY STUDIES:  White cell count is 4.7, hemoglobin 15, hematocrit 41, platelet count 80,000.  IMPRESSION:  Mr. Doolittle is a nice 71 year old gentleman with 2 separate primaries.  He had  his stage II colon cancer initially.  He underwent resection.  Because of the obstructing nature of his cancer, he is at high risk for recurrence. We gave him chemotherapy with FOLFOX that was completed back in January 2011. We then found he had a new primary bronchogenic carcinoma.  This was resected in March 2011. For now, we can just follow him every 6 months.  I think that we do not need any scans on him unless he develops any kind of symptoms.   ______________________________ Josph Macho, M.D. PRE/MEDQ  D:  02/26/2012  T:  02/27/2012  Job:  [id number]

## 2012-03-01 ENCOUNTER — Telehealth: Payer: Self-pay | Admitting: *Deleted

## 2012-03-01 NOTE — Telephone Encounter (Signed)
Called patient to let him know his labwork looked good per dr. Myna Hidalgo. Reiterated need to stop smoking for Dr. Lupita Leash

## 2012-03-01 NOTE — Telephone Encounter (Signed)
Message copied by Anselm Jungling on Fri Mar 01, 2012 10:42 AM ------      Message from: Arlan Organ R      Created: Thu Feb 29, 2012  5:29 PM       Call and tell him that his lab work looks really good. I do not see any problems. Tell him to stop smoking. Thanks. Cindee Lame

## 2012-08-27 DIAGNOSIS — I10 Essential (primary) hypertension: Secondary | ICD-10-CM | POA: Diagnosis not present

## 2012-08-27 DIAGNOSIS — Z0000 Encounter for general adult medical examination without abnormal findings: Secondary | ICD-10-CM | POA: Diagnosis not present

## 2012-08-27 DIAGNOSIS — Z85038 Personal history of other malignant neoplasm of large intestine: Secondary | ICD-10-CM | POA: Diagnosis not present

## 2012-08-27 DIAGNOSIS — Z125 Encounter for screening for malignant neoplasm of prostate: Secondary | ICD-10-CM | POA: Diagnosis not present

## 2012-08-27 DIAGNOSIS — Z85118 Personal history of other malignant neoplasm of bronchus and lung: Secondary | ICD-10-CM | POA: Diagnosis not present

## 2012-08-28 ENCOUNTER — Ambulatory Visit (HOSPITAL_BASED_OUTPATIENT_CLINIC_OR_DEPARTMENT_OTHER): Payer: Medicare Other | Admitting: Medical

## 2012-08-28 ENCOUNTER — Other Ambulatory Visit (HOSPITAL_BASED_OUTPATIENT_CLINIC_OR_DEPARTMENT_OTHER): Payer: Medicare Other | Admitting: Lab

## 2012-08-28 VITALS — BP 122/56 | HR 60 | Temp 97.7°F | Resp 18 | Ht 68.0 in | Wt 180.0 lb

## 2012-08-28 DIAGNOSIS — C3410 Malignant neoplasm of upper lobe, unspecified bronchus or lung: Secondary | ICD-10-CM | POA: Diagnosis not present

## 2012-08-28 DIAGNOSIS — C189 Malignant neoplasm of colon, unspecified: Secondary | ICD-10-CM | POA: Diagnosis not present

## 2012-08-28 DIAGNOSIS — Z85118 Personal history of other malignant neoplasm of bronchus and lung: Secondary | ICD-10-CM | POA: Diagnosis not present

## 2012-08-28 DIAGNOSIS — Z85038 Personal history of other malignant neoplasm of large intestine: Secondary | ICD-10-CM | POA: Diagnosis not present

## 2012-08-28 LAB — COMPREHENSIVE METABOLIC PANEL
ALT: 11 U/L (ref 0–53)
AST: 14 U/L (ref 0–37)
Albumin: 4.1 g/dL (ref 3.5–5.2)
Alkaline Phosphatase: 98 U/L (ref 39–117)
BUN: 11 mg/dL (ref 6–23)
CO2: 27 mEq/L (ref 19–32)
Calcium: 8.7 mg/dL (ref 8.4–10.5)
Chloride: 103 mEq/L (ref 96–112)
Creatinine, Ser: 0.75 mg/dL (ref 0.50–1.35)
Glucose, Bld: 78 mg/dL (ref 70–99)
Potassium: 3.8 mEq/L (ref 3.5–5.3)
Sodium: 139 mEq/L (ref 135–145)
Total Bilirubin: 0.6 mg/dL (ref 0.3–1.2)
Total Protein: 6.4 g/dL (ref 6.0–8.3)

## 2012-08-28 LAB — CBC WITH DIFFERENTIAL (CANCER CENTER ONLY)
BASO#: 0 10*3/uL (ref 0.0–0.2)
BASO%: 0.6 % (ref 0.0–2.0)
EOS%: 3.3 % (ref 0.0–7.0)
Eosinophils Absolute: 0.2 10*3/uL (ref 0.0–0.5)
HCT: 43 % (ref 38.7–49.9)
HGB: 15.5 g/dL (ref 13.0–17.1)
LYMPH#: 1.4 10*3/uL (ref 0.9–3.3)
LYMPH%: 26.2 % (ref 14.0–48.0)
MCH: 35.1 pg — ABNORMAL HIGH (ref 28.0–33.4)
MCHC: 36 g/dL — ABNORMAL HIGH (ref 32.0–35.9)
MCV: 98 fL (ref 82–98)
MONO#: 0.6 10*3/uL (ref 0.1–0.9)
MONO%: 12.2 % (ref 0.0–13.0)
NEUT#: 3 10*3/uL (ref 1.5–6.5)
NEUT%: 57.7 % (ref 40.0–80.0)
Platelets: 102 10*3/uL — ABNORMAL LOW (ref 145–400)
RBC: 4.41 10*6/uL (ref 4.20–5.70)
RDW: 14.1 % (ref 11.1–15.7)
WBC: 5.2 10*3/uL (ref 4.0–10.0)

## 2012-08-28 LAB — LACTATE DEHYDROGENASE: LDH: 129 U/L (ref 94–250)

## 2012-08-28 LAB — CEA: CEA: 1.2 ng/mL (ref 0.0–5.0)

## 2012-08-28 NOTE — Progress Notes (Signed)
Diagnoses: #1 stage IA poorly differentiated carcinoma of the right lung. #2.  History of stage II (T3, N0, M0) adenocarcinoma of the ascending colon.  Current therapy: Observation.  Interim history: [name] presents today for an office followup visit.  We continue to monitor him every 6 months.  His last CEA back in July was 1.2.  His last scans were done back in November 2012.  There was no evidence of recurrent disease, with respect to his CT scans.  Unfortunately, he still continues to smoke a pack a day.  We did discuss smoking cessation, however, he's not interested in that right now.  She reports, that he is eating, well and is not having any nausea, vomiting, diarrhea, or, constipation.  He's not having any issues with his, bowel movements.  He's not passing any obvious, or abnormal bleeding.  He denies any cough, chest pain, or shortness of breath.  He denies any fevers, chills, or night sweats.  He denies any headaches, visual changes, or rashes.  He denies any abdominal pain.  He denies any lower leg swelling.  For the most part.  He is able to perform his activities of daily living without any hindrance or decline.  Review of Systems: Constitutional:Negative for malaise/fatigue, fever, chills, weight loss, diaphoresis, activity change, appetite change, and unexpected weight change.  HEENT: Negative for double vision, blurred vision, visual loss, ear pain, tinnitus, congestion, rhinorrhea, epistaxis sore throat or sinus disease, oral pain/lesion, tongue soreness Respiratory: Negative for cough, chest tightness, shortness of breath, wheezing and stridor.  Cardiovascular: Negative for chest pain, palpitations, leg swelling, orthopnea, PND, DOE or claudication Gastrointestinal: Negative for nausea, vomiting, abdominal pain, diarrhea, constipation, blood in stool, melena, hematochezia, abdominal distention, anal bleeding, rectal pain, anorexia and hematemesis.  Genitourinary: Negative for  dysuria, frequency, hematuria,  Musculoskeletal: Negative for myalgias, back pain, joint swelling, arthralgias and gait problem.  Skin: Negative for rash, color change, pallor and wound.  Neurological:. Negative for dizziness/light-headedness, tremors, seizures, syncope, facial asymmetry, speech difficulty, weakness, numbness, headaches and paresthesias.  Hematological: Negative for adenopathy. Does not bruise/bleed easily.  Psychiatric/Behavioral:  Negative for depression, no loss of interest in normal activity or change in sleep pattern.   Physical Exam:  This is a pleasant, well-developed, well-nourished, 72 year old, white gentleman, in no obvious distress Vitals: Temperature 97.7 degrees, pulse 60, respirations 18, blood pressure 1.2, 56, weight 180 pounds HEENT reveals a normocephalic, atraumatic skull, no scleral icterus, no oral lesions  Neck is supple without any cervical or supraclavicular adenopathy.  Lungs are clear to auscultation bilaterally. There are no wheezes, rales or rhonci Cardiac is regular rate and rhythm with a normal S1 and S2. There are no murmurs, rubs, or bruits.  Abdomen is soft with good bowel sounds, there is no palpable mass. There is no palpable hepatosplenomegaly. There is no palpable fluid wave.  Musculoskeletal no tenderness of the spine, ribs, or hips.  Extremities there are no clubbing, cyanosis, or edema.  Skin no petechia, purpura or ecchymosis Neurologic is nonfocal.  Laboratory Data: White count 5.2, hemoglobin 15.5, hematocrit 43.0 platelets 102,000   Current Outpatient Prescriptions on File Prior to Visit  Medication Sig Dispense Refill  . aspirin (ASPIRIN EC) 81 MG EC tablet Take 81 mg by mouth daily. Swallow whole.      . benazepril (LOTENSIN) 10 MG tablet Take 10 mg by mouth daily.       Assessment/Plan: This is a 72 year old, white gentleman, with the following issues:  #1.  Stage  II colon cancer, initially he did undergo resection.   Because of the obstructing nature of his cancer, he is at high risk for recurrence.  He did receive chemotherapy with FOLFOX and was completed back in January, 2011.  We will continue to monitor his CEA tumor marker.  #2.  Primary bronchogenic carcinoma.  This was resected in March 2011.  At this point, we do not need to get any scans unless he becomes symptomatic.  #3.  Followup.  We will follow back up with [name] in 6 months, but before then should there be questions or concerns.

## 2012-08-29 ENCOUNTER — Telehealth: Payer: Self-pay | Admitting: *Deleted

## 2012-08-29 NOTE — Telephone Encounter (Signed)
Called patient to let him know that his labwork was all good per dr. ennever 

## 2012-08-29 NOTE — Telephone Encounter (Signed)
Message copied by Anselm Jungling on Thu Aug 29, 2012 10:19 AM ------      Message from: Arlan Organ R      Created: Wed Aug 28, 2012  6:33 PM       Call - labs look great!!  pete

## 2012-12-26 ENCOUNTER — Other Ambulatory Visit: Payer: Self-pay | Admitting: Gastroenterology

## 2012-12-26 DIAGNOSIS — Z09 Encounter for follow-up examination after completed treatment for conditions other than malignant neoplasm: Secondary | ICD-10-CM | POA: Diagnosis not present

## 2012-12-26 DIAGNOSIS — D126 Benign neoplasm of colon, unspecified: Secondary | ICD-10-CM | POA: Diagnosis not present

## 2012-12-26 DIAGNOSIS — K5730 Diverticulosis of large intestine without perforation or abscess without bleeding: Secondary | ICD-10-CM | POA: Diagnosis not present

## 2012-12-26 DIAGNOSIS — Z980 Intestinal bypass and anastomosis status: Secondary | ICD-10-CM | POA: Diagnosis not present

## 2012-12-26 DIAGNOSIS — Z85038 Personal history of other malignant neoplasm of large intestine: Secondary | ICD-10-CM | POA: Diagnosis not present

## 2013-02-25 DIAGNOSIS — F17200 Nicotine dependence, unspecified, uncomplicated: Secondary | ICD-10-CM | POA: Diagnosis not present

## 2013-02-25 DIAGNOSIS — I10 Essential (primary) hypertension: Secondary | ICD-10-CM | POA: Diagnosis not present

## 2013-02-26 ENCOUNTER — Ambulatory Visit (HOSPITAL_BASED_OUTPATIENT_CLINIC_OR_DEPARTMENT_OTHER): Payer: Medicare Other | Admitting: Medical

## 2013-02-26 ENCOUNTER — Other Ambulatory Visit (HOSPITAL_BASED_OUTPATIENT_CLINIC_OR_DEPARTMENT_OTHER): Payer: Medicare Other | Admitting: Lab

## 2013-02-26 VITALS — BP 119/55 | HR 57 | Temp 98.1°F | Resp 18 | Ht 68.0 in | Wt 175.0 lb

## 2013-02-26 DIAGNOSIS — C3410 Malignant neoplasm of upper lobe, unspecified bronchus or lung: Secondary | ICD-10-CM | POA: Diagnosis not present

## 2013-02-26 DIAGNOSIS — C189 Malignant neoplasm of colon, unspecified: Secondary | ICD-10-CM | POA: Diagnosis not present

## 2013-02-26 LAB — CBC WITH DIFFERENTIAL (CANCER CENTER ONLY)
BASO#: 0 10*3/uL (ref 0.0–0.2)
BASO%: 0.2 % (ref 0.0–2.0)
EOS%: 2.3 % (ref 0.0–7.0)
Eosinophils Absolute: 0.1 10*3/uL (ref 0.0–0.5)
HCT: 42.1 % (ref 38.7–49.9)
HGB: 15.1 g/dL (ref 13.0–17.1)
LYMPH#: 1 10*3/uL (ref 0.9–3.3)
LYMPH%: 21.7 % (ref 14.0–48.0)
MCH: 36 pg — ABNORMAL HIGH (ref 28.0–33.4)
MCHC: 35.9 g/dL (ref 32.0–35.9)
MCV: 100 fL — ABNORMAL HIGH (ref 82–98)
MONO#: 0.5 10*3/uL (ref 0.1–0.9)
MONO%: 10.1 % (ref 0.0–13.0)
NEUT#: 3.1 10*3/uL (ref 1.5–6.5)
NEUT%: 65.7 % (ref 40.0–80.0)
Platelets: 94 10*3/uL — ABNORMAL LOW (ref 145–400)
RBC: 4.2 10*6/uL (ref 4.20–5.70)
RDW: 13.7 % (ref 11.1–15.7)
WBC: 4.8 10*3/uL (ref 4.0–10.0)

## 2013-02-26 LAB — COMPREHENSIVE METABOLIC PANEL
ALT: 9 U/L (ref 0–53)
AST: 12 U/L (ref 0–37)
Albumin: 3.7 g/dL (ref 3.5–5.2)
Alkaline Phosphatase: 92 U/L (ref 39–117)
BUN: 8 mg/dL (ref 6–23)
CO2: 29 mEq/L (ref 19–32)
Calcium: 8.8 mg/dL (ref 8.4–10.5)
Chloride: 106 mEq/L (ref 96–112)
Creatinine, Ser: 0.88 mg/dL (ref 0.50–1.35)
Glucose, Bld: 86 mg/dL (ref 70–99)
Potassium: 3.8 mEq/L (ref 3.5–5.3)
Sodium: 141 mEq/L (ref 135–145)
Total Bilirubin: 0.6 mg/dL (ref 0.3–1.2)
Total Protein: 6 g/dL (ref 6.0–8.3)

## 2013-02-26 LAB — CEA: CEA: 1 ng/mL (ref 0.0–5.0)

## 2013-02-26 LAB — LACTATE DEHYDROGENASE: LDH: 119 U/L (ref 94–250)

## 2013-02-26 NOTE — Progress Notes (Signed)
Diagnoses: #1 stage IA poorly differentiated carcinoma of the right lung. #2.  History of stage II (T3, N0, M0) adenocarcinoma of the ascending colon.  Current therapy: Observation.  Interim history: Jerome Kelley presents today for an office followup visit.  We continue to monitor him every 6 months.  His last CEA back in January was 1.2.  His last scans were done back in November 2012.  There was no evidence of recurrent disease, with respect to his CT scans.  Unfortunately, he still continues to smoke.  He states he has cut back to about a half a pack a day.  We did discuss smoking cessation, however, he's not interested in that right now.  He reports, that he is eating, well and is not having any nausea, vomiting, diarrhea, or, constipation.  He's not having any issues with his, bowel movements.  He's not passing any obvious, or abnormal bleeding.  He denies any cough, chest pain, or shortness of breath.  He denies any fevers, chills, or night sweats.  He denies any headaches, visual changes, or rashes.  He denies any abdominal pain.  He denies any lower leg swelling.  For the most part.  He is able to perform his activities of daily living without any hindrance or decline.  Review of Systems: Constitutional:Negative for malaise/fatigue, fever, chills, weight loss, diaphoresis, activity change, appetite change, and unexpected weight change.  HEENT: Negative for double vision, blurred vision, visual loss, ear pain, tinnitus, congestion, rhinorrhea, epistaxis sore throat or sinus disease, oral pain/lesion, tongue soreness Respiratory: Negative for cough, chest tightness, shortness of breath, wheezing and stridor.  Cardiovascular: Negative for chest pain, palpitations, leg swelling, orthopnea, PND, DOE or claudication Gastrointestinal: Negative for nausea, vomiting, abdominal pain, diarrhea, constipation, blood in stool, melena, hematochezia, abdominal distention, anal bleeding, rectal pain, anorexia and  hematemesis.  Genitourinary: Negative for dysuria, frequency, hematuria,  Musculoskeletal: Negative for myalgias, back pain, joint swelling, arthralgias and gait problem.  Skin: Negative for rash, color change, pallor and wound.  Neurological:. Negative for dizziness/light-headedness, tremors, seizures, syncope, facial asymmetry, speech difficulty, weakness, numbness, headaches and paresthesias.  Hematological: Negative for adenopathy. Does not bruise/bleed easily.  Psychiatric/Behavioral:  Negative for depression, no loss of interest in normal activity or change in sleep pattern.   Physical Exam:  This is a pleasant, well-developed, well-nourished, 72 year old, white gentleman, in no obvious distress Vitals: Temperature 98.1 degrees pulse 57 respirations 18 blood pressure 119/55 weight 175 pounds HEENT reveals a normocephalic, atraumatic skull, no scleral icterus, no oral lesions  Neck is supple without any cervical or supraclavicular adenopathy.  Lungs are clear to auscultation bilaterally. There are no wheezes, rales or rhonci Cardiac is regular rate and rhythm with a normal S1 and S2. There are no murmurs, rubs, or bruits.  Abdomen is soft with good bowel sounds, there is no palpable mass. There is no palpable hepatosplenomegaly. There is no palpable fluid wave.  Musculoskeletal no tenderness of the spine, ribs, or hips.  Extremities there are no clubbing, cyanosis, or edema.  Skin no petechia, purpura or ecchymosis Neurologic is nonfocal.  Laboratory Data: White count 4.8 hemoglobin 15.1 hematocrit 42.1 platelets 94,000   Current Outpatient Prescriptions on File Prior to Visit  Medication Sig Dispense Refill  . aspirin (ASPIRIN EC) 81 MG EC tablet Take 81 mg by mouth daily. Swallow whole.      . benazepril (LOTENSIN) 10 MG tablet Take 10 mg by mouth daily.       Assessment/Plan: This is a 72 year old,  white gentleman, with the following issues:  #1.  Stage II colon cancer,  initially he did undergo resection.  Because of the obstructing nature of his cancer, he is at high risk for recurrence.  He did receive chemotherapy with FOLFOX and was completed back in January, 2011.  We will continue to monitor his CEA tumor marker.  #2.  Primary bronchogenic carcinoma.  This was resected in March 2011.  At this point, we do not need to get any scans unless he becomes symptomatic.  #3.  Followup.  We will follow back up with Jerome Kelley in 6 months, but before then should there be questions or concerns.

## 2013-09-03 ENCOUNTER — Encounter: Payer: Self-pay | Admitting: Hematology & Oncology

## 2013-09-03 ENCOUNTER — Other Ambulatory Visit (HOSPITAL_BASED_OUTPATIENT_CLINIC_OR_DEPARTMENT_OTHER): Payer: Medicare Other | Admitting: Lab

## 2013-09-03 ENCOUNTER — Ambulatory Visit (HOSPITAL_BASED_OUTPATIENT_CLINIC_OR_DEPARTMENT_OTHER): Payer: Medicare Other | Admitting: Hematology & Oncology

## 2013-09-03 VITALS — BP 138/64 | HR 60 | Temp 97.7°F | Resp 20 | Ht 69.0 in | Wt 178.0 lb

## 2013-09-03 DIAGNOSIS — Z85118 Personal history of other malignant neoplasm of bronchus and lung: Secondary | ICD-10-CM

## 2013-09-03 DIAGNOSIS — C189 Malignant neoplasm of colon, unspecified: Secondary | ICD-10-CM | POA: Diagnosis not present

## 2013-09-03 DIAGNOSIS — Z85038 Personal history of other malignant neoplasm of large intestine: Secondary | ICD-10-CM

## 2013-09-03 DIAGNOSIS — F17200 Nicotine dependence, unspecified, uncomplicated: Secondary | ICD-10-CM | POA: Diagnosis not present

## 2013-09-03 DIAGNOSIS — Z9189 Other specified personal risk factors, not elsewhere classified: Secondary | ICD-10-CM

## 2013-09-03 LAB — COMPREHENSIVE METABOLIC PANEL
ALT: 9 U/L (ref 0–53)
AST: 14 U/L (ref 0–37)
Albumin: 3.8 g/dL (ref 3.5–5.2)
Alkaline Phosphatase: 93 U/L (ref 39–117)
BUN: 12 mg/dL (ref 6–23)
CO2: 27 mEq/L (ref 19–32)
Calcium: 8.9 mg/dL (ref 8.4–10.5)
Chloride: 104 mEq/L (ref 96–112)
Creatinine, Ser: 0.86 mg/dL (ref 0.50–1.35)
Glucose, Bld: 62 mg/dL — ABNORMAL LOW (ref 70–99)
Potassium: 3.9 mEq/L (ref 3.5–5.3)
Sodium: 138 mEq/L (ref 135–145)
Total Bilirubin: 0.8 mg/dL (ref 0.3–1.2)
Total Protein: 6.3 g/dL (ref 6.0–8.3)

## 2013-09-03 LAB — LACTATE DEHYDROGENASE: LDH: 133 U/L (ref 94–250)

## 2013-09-03 LAB — CBC WITH DIFFERENTIAL (CANCER CENTER ONLY)
BASO#: 0 10*3/uL (ref 0.0–0.2)
BASO%: 0.6 % (ref 0.0–2.0)
EOS%: 3.5 % (ref 0.0–7.0)
Eosinophils Absolute: 0.2 10*3/uL (ref 0.0–0.5)
HCT: 44 % (ref 38.7–49.9)
HGB: 15.3 g/dL (ref 13.0–17.1)
LYMPH#: 1.3 10*3/uL (ref 0.9–3.3)
LYMPH%: 25.3 % (ref 14.0–48.0)
MCH: 34.2 pg — ABNORMAL HIGH (ref 28.0–33.4)
MCHC: 34.8 g/dL (ref 32.0–35.9)
MCV: 98 fL (ref 82–98)
MONO#: 0.7 10*3/uL (ref 0.1–0.9)
MONO%: 13.8 % — ABNORMAL HIGH (ref 0.0–13.0)
NEUT#: 2.9 10*3/uL (ref 1.5–6.5)
NEUT%: 56.8 % (ref 40.0–80.0)
Platelets: 107 10*3/uL — ABNORMAL LOW (ref 145–400)
RBC: 4.47 10*6/uL (ref 4.20–5.70)
RDW: 13.7 % (ref 11.1–15.7)
WBC: 5.1 10*3/uL (ref 4.0–10.0)

## 2013-09-03 LAB — CEA: CEA: 0.8 ng/mL (ref 0.0–5.0)

## 2013-09-03 NOTE — Patient Instructions (Signed)
Smoking Cessation, Tips for Success If you are ready to quit smoking, congratulations! You have chosen to help yourself be healthier. Cigarettes bring nicotine, tar, carbon monoxide, and other irritants into your body. Your lungs, heart, and blood vessels will be able to work better without these poisons. There are many different ways to quit smoking. Nicotine gum, nicotine patches, a nicotine inhaler, or nicotine nasal spray can help with physical craving. Hypnosis, support groups, and medicines help break the habit of smoking. WHAT THINGS CAN I DO TO MAKE QUITTING EASIER?  Here are some tips to help you quit for good:  Pick a date when you will quit smoking completely. Tell all of your friends and family about your plan to quit on that date.  Do not try to slowly cut down on the number of cigarettes you are smoking. Pick a quit date and quit smoking completely starting on that day.  Throw away all cigarettes.   Clean and remove all ashtrays from your home, work, and car.   On a card, write down your reasons for quitting. Carry the card with you and read it when you get the urge to smoke.   Cleanse your body of nicotine. Drink enough water and fluids to keep your urine clear or pale yellow. Do this after quitting to flush the nicotine from your body.   Learn to predict your moods. Do not let a bad situation be your excuse to have a cigarette. Some situations in your life might tempt you into wanting a cigarette.   Never have "just one" cigarette. It leads to wanting another and another. Remind yourself of your decision to quit.   Change habits associated with smoking. If you smoked while driving or when feeling stressed, try other activities to replace smoking. Stand up when drinking your coffee. Brush your teeth after eating. Sit in a different chair when you read the paper. Avoid alcohol while trying to quit, and try to drink fewer caffeinated beverages. Alcohol and caffeine may urge  you to smoke.   Avoid foods and drinks that can trigger a desire to smoke, such as sugary or spicy foods and alcohol.   Ask people who smoke not to smoke around you.   Have something planned to do right after eating or having a cup of coffee. For example, plan to take a walk or exercise.   Try a relaxation exercise to calm you down and decrease your stress. Remember, you may be tense and nervous for the first 2 weeks after you quit, but this will pass.   Find new activities to keep your hands busy. Play with a pen, coin, or rubber band. Doodle or draw things on paper.   Brush your teeth right after eating. This will help cut down on the craving for the taste of tobacco after meals. You can also try mouthwash.   Use oral substitutes in place of cigarettes. Try using lemon drops, carrots, cinnamon sticks, or chewing gum. Keep them handy so they are available when you have the urge to smoke.   When you have the urge to smoke, try deep breathing.   Designate your home as a nonsmoking area.   If you are a heavy smoker, ask your health care provider about a prescription for nicotine chewing gum. It can ease your withdrawal from nicotine.   Reward yourself. Set aside the cigarette money you save and buy yourself something nice.   Look for support from others. Join a support group or   smoking cessation program. Ask someone at home or at work to help you with your plan to quit smoking.   Always ask yourself, "Do I need this cigarette or is this just a reflex?" Tell yourself, "Today, I choose not to smoke," or "I do not want to smoke." You are reminding yourself of your decision to quit.  Do not replace cigarette smoking with electronic cigarettes (commonly called e-cigarettes). The safety of e-cigarettes is unknown, and some may contain harmful chemicals.  If you relapse, do not give up! Plan ahead and think about what you will do the next time you get the urge to smoke.  HOW WILL  I FEEL WHEN I QUIT SMOKING? You may have symptoms of withdrawal because your body is used to nicotine (the addictive substance in cigarettes). You may crave cigarettes, be irritable, feel very hungry, cough often, get headaches, or have difficulty concentrating. The withdrawal symptoms are only temporary. They are strongest when you first quit but will go away within 10 14 days. When withdrawal symptoms occur, stay in control. Think about your reasons for quitting. Remind yourself that these are signs that your body is healing and getting used to being without cigarettes. Remember that withdrawal symptoms are easier to treat than the major diseases that smoking can cause.  Even after the withdrawal is over, expect periodic urges to smoke. However, these cravings are generally short lived and will go away whether you smoke or not. Do not smoke!  WHAT RESOURCES ARE AVAILABLE TO HELP ME QUIT SMOKING? Your health care provider can direct you to community resources or hospitals for support, which may include:  Group support.  Education.  Hypnosis.  Therapy. Document Released: 04/28/2004 Document Revised: 05/21/2013 Document Reviewed: 01/16/2013 [employer] Patient Information 2014 [employer].  

## 2013-09-03 NOTE — Progress Notes (Signed)
This office note has been dictated.

## 2013-09-04 NOTE — Progress Notes (Signed)
DIAGNOSES: 1. Stage IA (T1A N0 M0) poorly differentiated adenocarcinoma of the     right lung -- resected in March 2011. 2. Stage II (T3 N0 M0) adenocarcinoma of the ascending colon --     resected in April 2010.  CURRENT THERAPY:  Observation.  INTERIM HISTORY:  Mr. Scheib comes in for his followup.  He is doing quite well.  We last saw him back in July.  Since then, he has had no problems.  He has been patient.  His wife and he had been helping out the grand kids.  Of course, still smoking.  He still smokes a pack a day.  He says he will not stop doing this.  He understands fully the implications of smoking.  He has already had lung cancer once.  He is showing the risk for lung cancer again.  He has had no problems with cough or shortness of breath.  He has had no nausea or vomiting.  There has been no change in bowel or bladder habits.  He has had no fever, sweats, or chills.  He has had no rashes.  PHYSICAL EXAMINATION:  General:  This is a well-developed, well- nourished white gentleman in no obvious distress.  Vital Signs: Temperature of 97.7, pulse 60, respiratory  rate 20, blood pressure 138/64, weight is 178 pounds.  Head and Neck:  Normocephalic, atraumatic skull.  There are no ocular or oral lesions.  There are no palpable cervical or supraclavicular lymph nodes.  Lungs:  Clear bilaterally. Cardiac:  Regular rate and rhythm with normal S1, S2.  There are no murmurs, rubs, or bruits.  Abdomen:  Soft.  He has well-healed laparotomy scar.  He has no fluid wave.  There is no palpable hepatosplenomegaly.  Back:  No tenderness over the spine, ribs, or hips. He has well-healed thoracotomy scar in the right lateral chest wall. Extremities:  No clubbing, cyanosis, or edema.  Neurological:  No focal neurological deficits.  Skin:  No rashes, ecchymoses, or petechia.  LABORATORY STUDIES:  White cell count is 5.1, hemoglobin 15.3, hematocrit 44, platelet count 107.  IMPRESSION:   Mr. Byas is a nice 73 year old gentleman.  He has both stage II colon cancer and stage I lung cancer.  He did receive adjuvant chemotherapy for the colon cancer given that he had high-risk features at that time.  For now, I really do not think we have to get him back to see [place].  I know he has the thrombocytopenia.  This is chronic.  I suspect, that this probably more so is related to his liver.  It maybe some hepatic steatosis.  He is asymptomatic with his thrombocytopenia.  We will let Mr. Kohles go for now. We will be more happy to see him back, if necessary in the future.    ______________________________ [name], M.D. PRE/MEDQ  D:  09/03/2013  T:  09/04/2013  Job:  [id number]

## 2013-09-23 DIAGNOSIS — R972 Elevated prostate specific antigen [PSA]: Secondary | ICD-10-CM | POA: Diagnosis not present

## 2013-09-23 DIAGNOSIS — Z125 Encounter for screening for malignant neoplasm of prostate: Secondary | ICD-10-CM | POA: Diagnosis not present

## 2013-09-23 DIAGNOSIS — Z23 Encounter for immunization: Secondary | ICD-10-CM | POA: Diagnosis not present

## 2013-09-23 DIAGNOSIS — Z85038 Personal history of other malignant neoplasm of large intestine: Secondary | ICD-10-CM | POA: Diagnosis not present

## 2013-09-23 DIAGNOSIS — F17200 Nicotine dependence, unspecified, uncomplicated: Secondary | ICD-10-CM | POA: Diagnosis not present

## 2013-09-23 DIAGNOSIS — Z0000 Encounter for general adult medical examination without abnormal findings: Secondary | ICD-10-CM | POA: Diagnosis not present

## 2013-09-23 DIAGNOSIS — Z85118 Personal history of other malignant neoplasm of bronchus and lung: Secondary | ICD-10-CM | POA: Diagnosis not present

## 2013-09-23 DIAGNOSIS — I10 Essential (primary) hypertension: Secondary | ICD-10-CM | POA: Diagnosis not present

## 2013-09-23 DIAGNOSIS — H269 Unspecified cataract: Secondary | ICD-10-CM | POA: Diagnosis not present

## 2013-10-23 DIAGNOSIS — H18519 Endothelial corneal dystrophy, unspecified eye: Secondary | ICD-10-CM | POA: Diagnosis not present

## 2013-10-23 DIAGNOSIS — H3500 Unspecified background retinopathy: Secondary | ICD-10-CM | POA: Diagnosis not present

## 2013-10-23 DIAGNOSIS — H2510 Age-related nuclear cataract, unspecified eye: Secondary | ICD-10-CM | POA: Diagnosis not present

## 2013-11-21 DIAGNOSIS — I10 Essential (primary) hypertension: Secondary | ICD-10-CM | POA: Diagnosis not present

## 2013-11-21 DIAGNOSIS — R972 Elevated prostate specific antigen [PSA]: Secondary | ICD-10-CM | POA: Diagnosis not present

## 2013-11-25 DIAGNOSIS — H2510 Age-related nuclear cataract, unspecified eye: Secondary | ICD-10-CM | POA: Diagnosis not present

## 2013-11-25 DIAGNOSIS — H2589 Other age-related cataract: Secondary | ICD-10-CM | POA: Diagnosis not present

## 2014-05-28 ENCOUNTER — Ambulatory Visit
Admission: RE | Admit: 2014-05-28 | Discharge: 2014-05-28 | Disposition: A | Discharge status: Home / Self Care | Payer: Medicare Other | Source: Ambulatory Visit | Attending: Family Medicine | Admitting: Family Medicine

## 2014-05-28 ENCOUNTER — Other Ambulatory Visit: Payer: Self-pay | Admitting: Family Medicine

## 2014-05-28 DIAGNOSIS — J449 Chronic obstructive pulmonary disease, unspecified: Secondary | ICD-10-CM

## 2014-05-28 DIAGNOSIS — Z85038 Personal history of other malignant neoplasm of large intestine: Secondary | ICD-10-CM | POA: Diagnosis not present

## 2014-05-28 DIAGNOSIS — R972 Elevated prostate specific antigen [PSA]: Secondary | ICD-10-CM | POA: Diagnosis not present

## 2014-05-28 DIAGNOSIS — F17210 Nicotine dependence, cigarettes, uncomplicated: Secondary | ICD-10-CM | POA: Diagnosis not present

## 2014-05-28 DIAGNOSIS — I10 Essential (primary) hypertension: Secondary | ICD-10-CM | POA: Diagnosis not present

## 2014-05-28 DIAGNOSIS — J984 Other disorders of lung: Secondary | ICD-10-CM | POA: Diagnosis not present

## 2014-05-28 DIAGNOSIS — R0602 Shortness of breath: Secondary | ICD-10-CM | POA: Diagnosis not present

## 2014-05-28 DIAGNOSIS — Z23 Encounter for immunization: Secondary | ICD-10-CM | POA: Diagnosis not present

## 2014-05-28 DIAGNOSIS — Z85118 Personal history of other malignant neoplasm of bronchus and lung: Secondary | ICD-10-CM | POA: Diagnosis not present

## 2014-05-28 DIAGNOSIS — J439 Emphysema, unspecified: Secondary | ICD-10-CM | POA: Diagnosis not present

## 2014-05-28 IMAGING — CR DG CHEST 2V
2 series · 2 of 2 positions shown · non-contrast
Comparison: [DATE]

CLINICAL DATA: Acute shortness of breath; history of chronic lung
scarring and underlying COPD

EXAM:
CHEST  2 VIEW

[w chest pa]
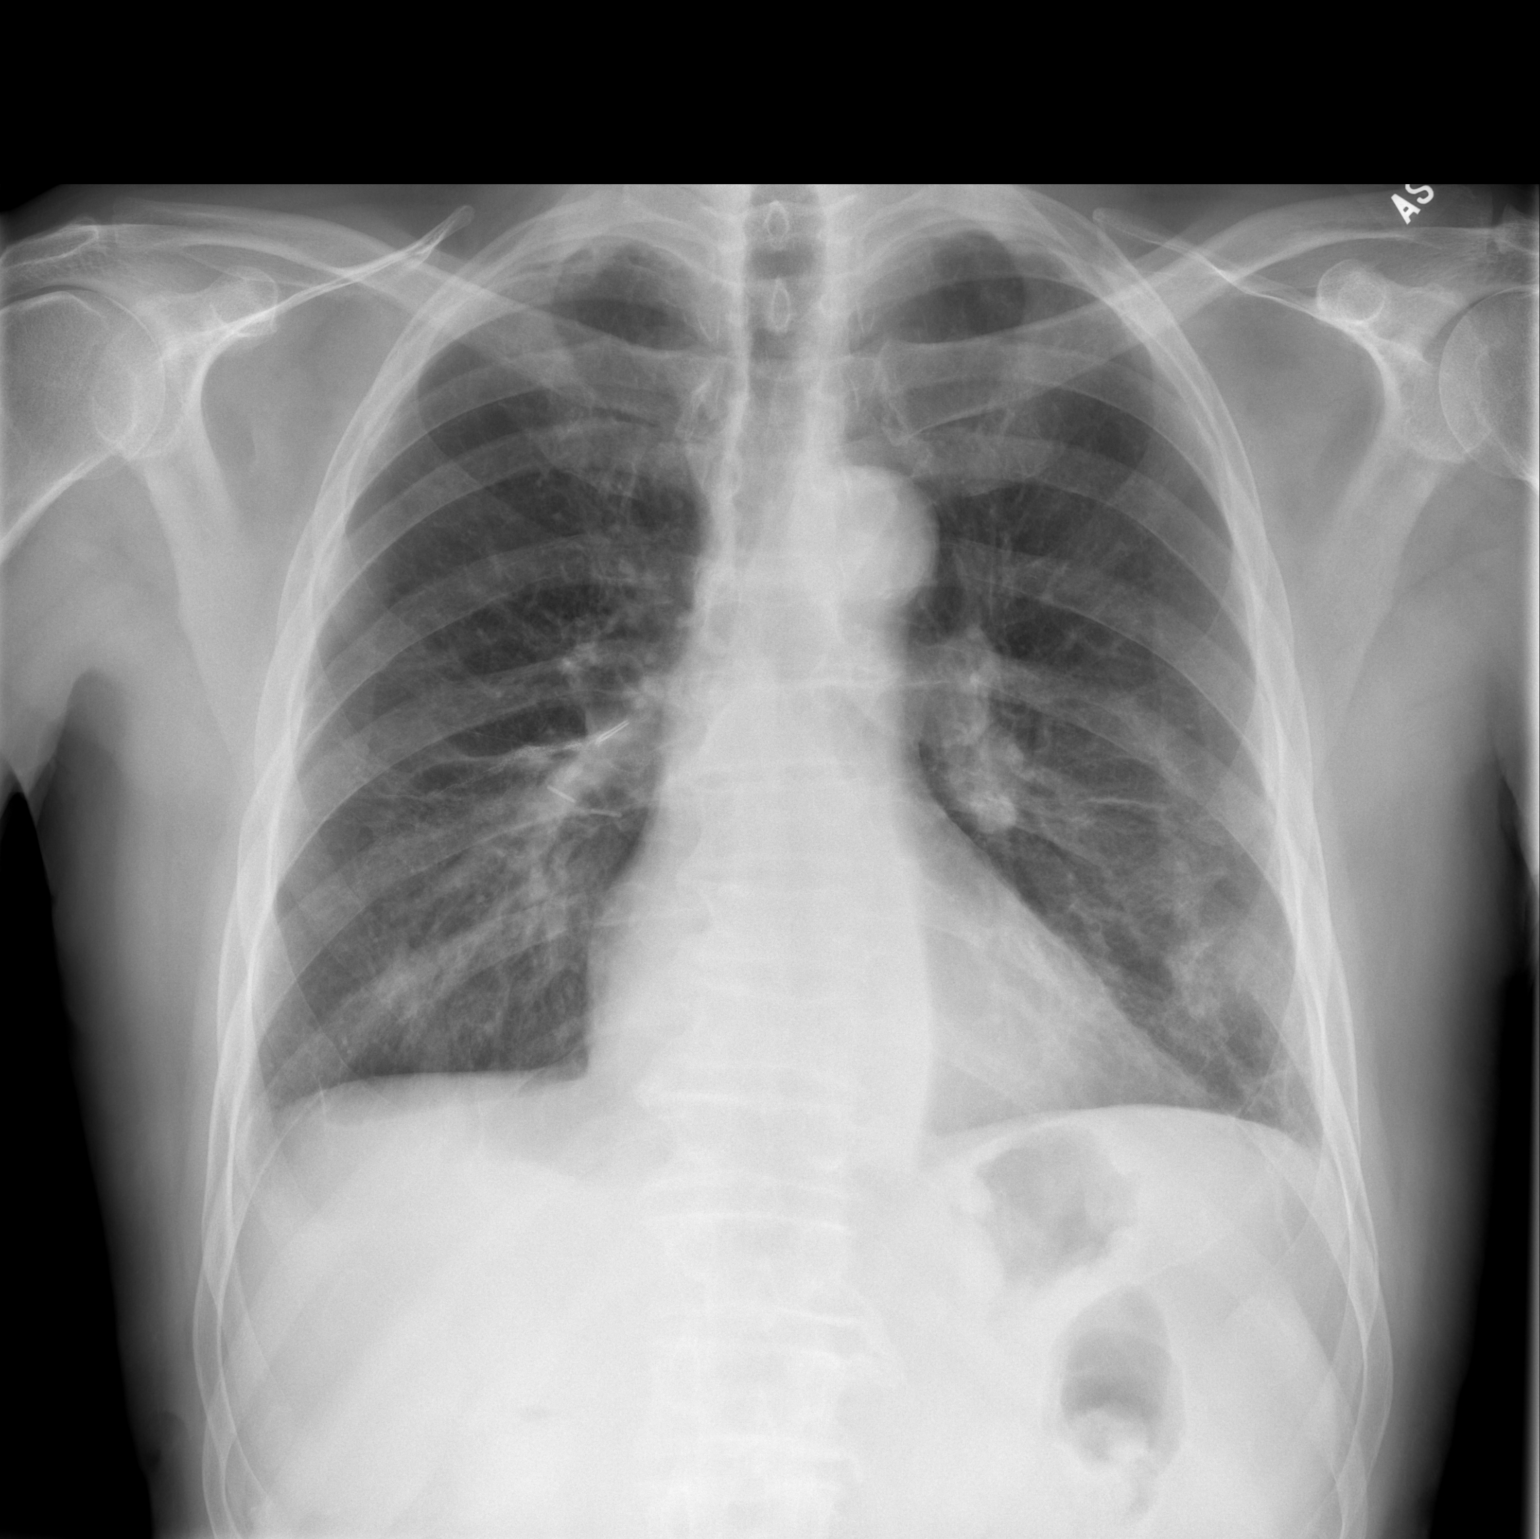

[w chest lat]
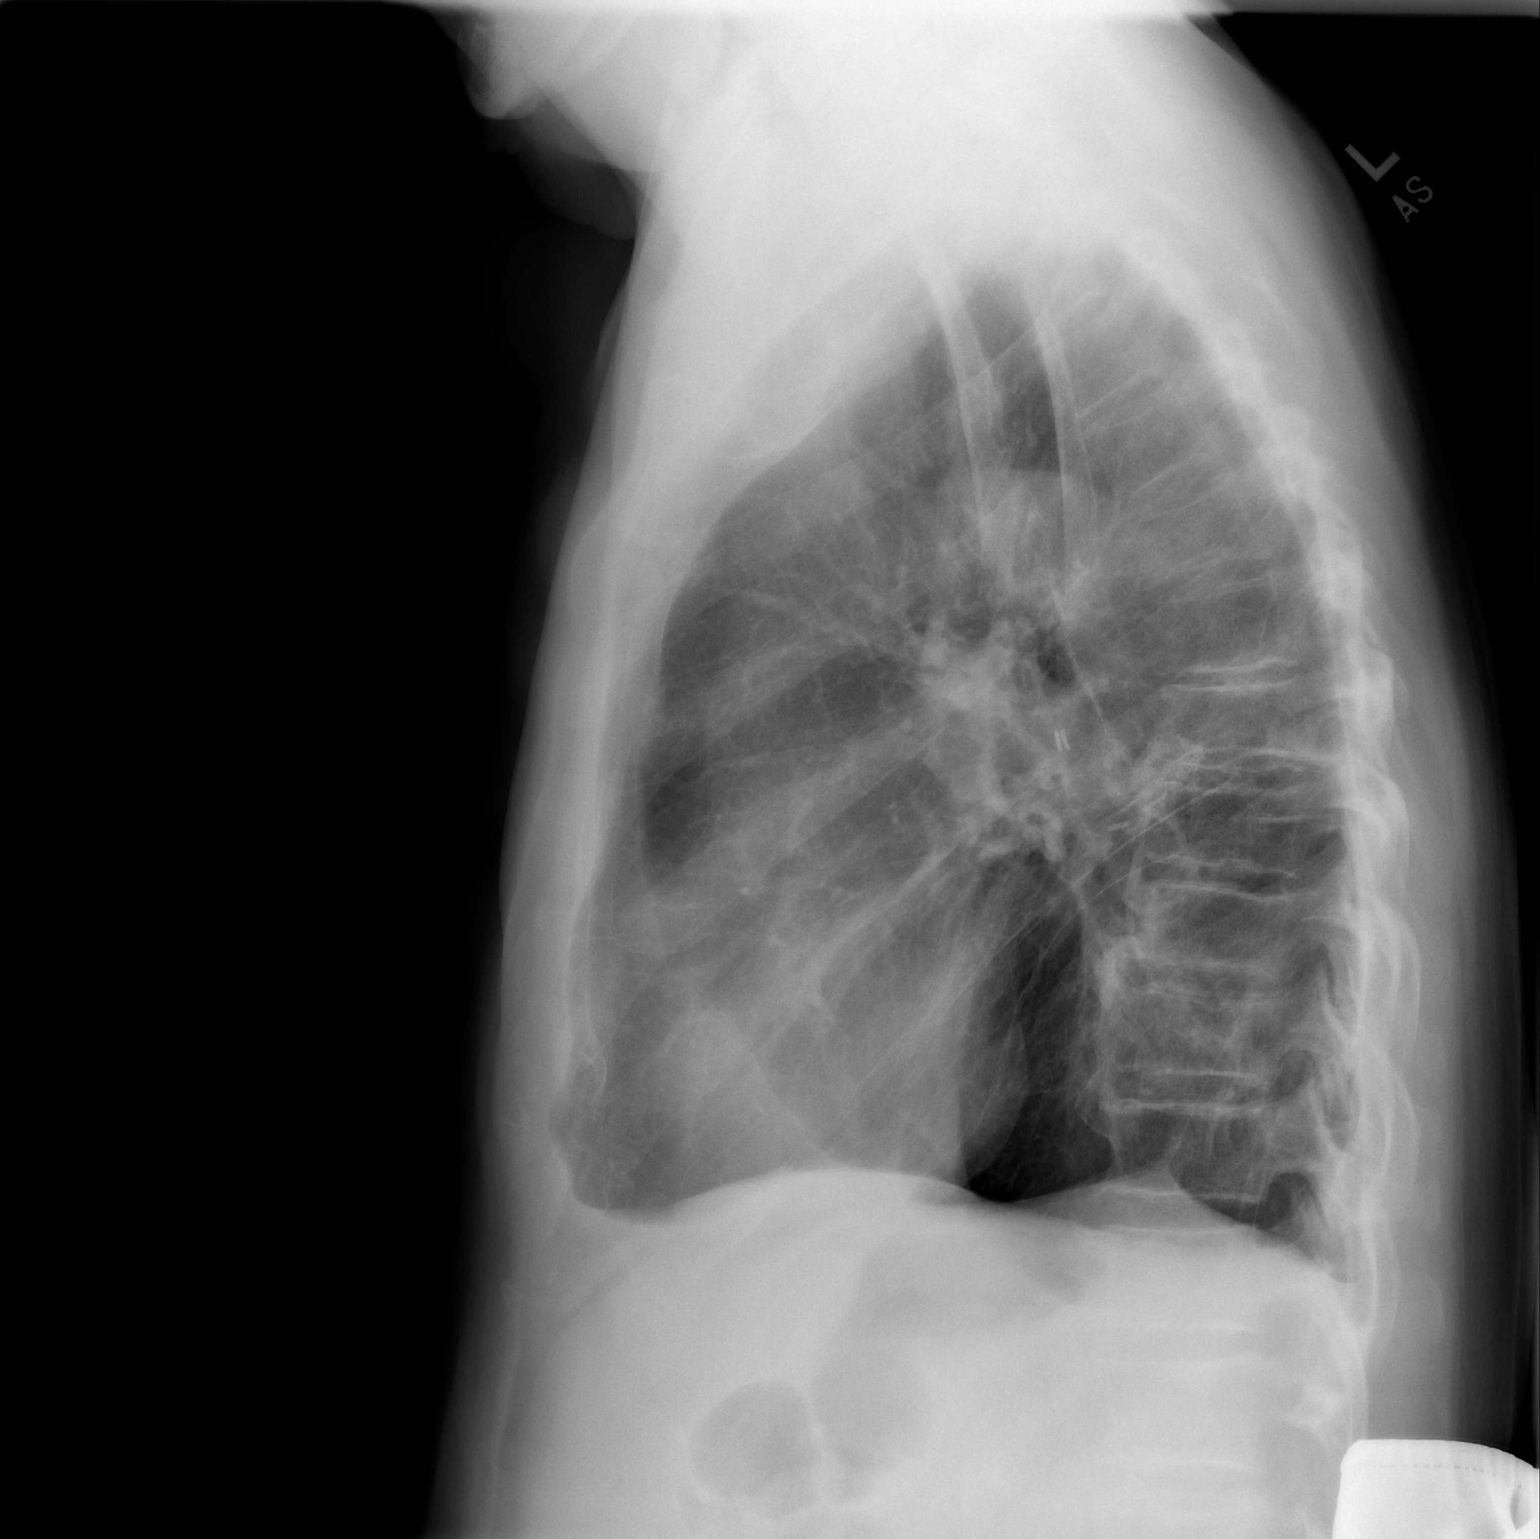

[2 of 2 positions shown; findings below may reference images not displayed]

FINDINGS: There is underlying emphysematous change. There is postoperative
change on the right. There is scarring in both lower lobes, slightly
more on the left than on the right. There is no frank edema or
consolidation. The heart size is normal. The pulmonary vascularity
reflects underlying emphysema. No adenopathy. There is degenerative
change in the thoracic spine. There is calcification in both carotid
arteries, more on the left than on the right.
IMPRESSION: Underlying emphysema. Areas of chronic scarring, stable. No frank
edema or consolidation. Atherosclerotic calcification, more severe
on the left than on the right.

## 2014-07-03 DIAGNOSIS — R972 Elevated prostate specific antigen [PSA]: Secondary | ICD-10-CM | POA: Diagnosis not present

## 2014-09-25 DIAGNOSIS — Z1389 Encounter for screening for other disorder: Secondary | ICD-10-CM | POA: Diagnosis not present

## 2014-09-25 DIAGNOSIS — J449 Chronic obstructive pulmonary disease, unspecified: Secondary | ICD-10-CM | POA: Diagnosis not present

## 2014-09-25 DIAGNOSIS — R972 Elevated prostate specific antigen [PSA]: Secondary | ICD-10-CM | POA: Diagnosis not present

## 2014-09-25 DIAGNOSIS — Z23 Encounter for immunization: Secondary | ICD-10-CM | POA: Diagnosis not present

## 2014-09-25 DIAGNOSIS — Z85038 Personal history of other malignant neoplasm of large intestine: Secondary | ICD-10-CM | POA: Diagnosis not present

## 2014-09-25 DIAGNOSIS — I10 Essential (primary) hypertension: Secondary | ICD-10-CM | POA: Diagnosis not present

## 2014-09-25 DIAGNOSIS — F17210 Nicotine dependence, cigarettes, uncomplicated: Secondary | ICD-10-CM | POA: Diagnosis not present

## 2014-09-25 DIAGNOSIS — Z85118 Personal history of other malignant neoplasm of bronchus and lung: Secondary | ICD-10-CM | POA: Diagnosis not present

## 2014-09-25 DIAGNOSIS — Z0000 Encounter for general adult medical examination without abnormal findings: Secondary | ICD-10-CM | POA: Diagnosis not present

## 2015-03-26 DIAGNOSIS — J449 Chronic obstructive pulmonary disease, unspecified: Secondary | ICD-10-CM | POA: Diagnosis not present

## 2015-03-26 DIAGNOSIS — I10 Essential (primary) hypertension: Secondary | ICD-10-CM | POA: Diagnosis not present

## 2015-03-26 DIAGNOSIS — F17210 Nicotine dependence, cigarettes, uncomplicated: Secondary | ICD-10-CM | POA: Diagnosis not present

## 2015-07-05 DIAGNOSIS — R972 Elevated prostate specific antigen [PSA]: Secondary | ICD-10-CM | POA: Diagnosis not present

## 2015-07-16 DIAGNOSIS — N400 Enlarged prostate without lower urinary tract symptoms: Secondary | ICD-10-CM | POA: Diagnosis not present

## 2015-07-16 DIAGNOSIS — R972 Elevated prostate specific antigen [PSA]: Secondary | ICD-10-CM | POA: Diagnosis not present

## 2015-10-13 DIAGNOSIS — Z85118 Personal history of other malignant neoplasm of bronchus and lung: Secondary | ICD-10-CM | POA: Diagnosis not present

## 2015-10-13 DIAGNOSIS — Z0000 Encounter for general adult medical examination without abnormal findings: Secondary | ICD-10-CM | POA: Diagnosis not present

## 2015-10-13 DIAGNOSIS — J449 Chronic obstructive pulmonary disease, unspecified: Secondary | ICD-10-CM | POA: Diagnosis not present

## 2015-10-13 DIAGNOSIS — Z85038 Personal history of other malignant neoplasm of large intestine: Secondary | ICD-10-CM | POA: Diagnosis not present

## 2015-10-13 DIAGNOSIS — Z1389 Encounter for screening for other disorder: Secondary | ICD-10-CM | POA: Diagnosis not present

## 2015-10-13 DIAGNOSIS — R972 Elevated prostate specific antigen [PSA]: Secondary | ICD-10-CM | POA: Diagnosis not present

## 2015-10-13 DIAGNOSIS — F17210 Nicotine dependence, cigarettes, uncomplicated: Secondary | ICD-10-CM | POA: Diagnosis not present

## 2015-10-13 DIAGNOSIS — I10 Essential (primary) hypertension: Secondary | ICD-10-CM | POA: Diagnosis not present

## 2016-02-09 ENCOUNTER — Other Ambulatory Visit: Payer: Self-pay | Admitting: Gastroenterology

## 2016-02-09 DIAGNOSIS — K621 Rectal polyp: Secondary | ICD-10-CM | POA: Diagnosis not present

## 2016-02-09 DIAGNOSIS — D126 Benign neoplasm of colon, unspecified: Secondary | ICD-10-CM | POA: Diagnosis not present

## 2016-02-09 DIAGNOSIS — D123 Benign neoplasm of transverse colon: Secondary | ICD-10-CM | POA: Diagnosis not present

## 2016-02-09 DIAGNOSIS — K5730 Diverticulosis of large intestine without perforation or abscess without bleeding: Secondary | ICD-10-CM | POA: Diagnosis not present

## 2016-02-09 DIAGNOSIS — Z85038 Personal history of other malignant neoplasm of large intestine: Secondary | ICD-10-CM | POA: Diagnosis not present

## 2016-02-09 DIAGNOSIS — D125 Benign neoplasm of sigmoid colon: Secondary | ICD-10-CM | POA: Diagnosis not present

## 2016-04-14 DIAGNOSIS — I10 Essential (primary) hypertension: Secondary | ICD-10-CM | POA: Diagnosis not present

## 2016-04-14 DIAGNOSIS — J449 Chronic obstructive pulmonary disease, unspecified: Secondary | ICD-10-CM | POA: Diagnosis not present

## 2016-04-14 DIAGNOSIS — F17210 Nicotine dependence, cigarettes, uncomplicated: Secondary | ICD-10-CM | POA: Diagnosis not present

## 2016-04-14 DIAGNOSIS — Z85118 Personal history of other malignant neoplasm of bronchus and lung: Secondary | ICD-10-CM | POA: Diagnosis not present

## 2016-10-23 DIAGNOSIS — J449 Chronic obstructive pulmonary disease, unspecified: Secondary | ICD-10-CM | POA: Diagnosis not present

## 2016-10-23 DIAGNOSIS — F17210 Nicotine dependence, cigarettes, uncomplicated: Secondary | ICD-10-CM | POA: Diagnosis not present

## 2016-10-23 DIAGNOSIS — I10 Essential (primary) hypertension: Secondary | ICD-10-CM | POA: Diagnosis not present

## 2016-10-23 DIAGNOSIS — R972 Elevated prostate specific antigen [PSA]: Secondary | ICD-10-CM | POA: Diagnosis not present

## 2016-10-23 DIAGNOSIS — Z125 Encounter for screening for malignant neoplasm of prostate: Secondary | ICD-10-CM | POA: Diagnosis not present

## 2016-10-23 DIAGNOSIS — Z85118 Personal history of other malignant neoplasm of bronchus and lung: Secondary | ICD-10-CM | POA: Diagnosis not present

## 2016-10-23 DIAGNOSIS — Z85038 Personal history of other malignant neoplasm of large intestine: Secondary | ICD-10-CM | POA: Diagnosis not present

## 2016-10-23 DIAGNOSIS — Z0000 Encounter for general adult medical examination without abnormal findings: Secondary | ICD-10-CM | POA: Diagnosis not present

## 2016-12-20 DIAGNOSIS — R972 Elevated prostate specific antigen [PSA]: Secondary | ICD-10-CM | POA: Diagnosis not present

## 2016-12-20 DIAGNOSIS — N400 Enlarged prostate without lower urinary tract symptoms: Secondary | ICD-10-CM | POA: Diagnosis not present

## 2017-04-25 ENCOUNTER — Other Ambulatory Visit: Payer: Self-pay | Admitting: Family Medicine

## 2017-04-25 DIAGNOSIS — Z85118 Personal history of other malignant neoplasm of bronchus and lung: Secondary | ICD-10-CM | POA: Diagnosis not present

## 2017-04-25 DIAGNOSIS — F17210 Nicotine dependence, cigarettes, uncomplicated: Secondary | ICD-10-CM | POA: Diagnosis not present

## 2017-04-25 DIAGNOSIS — I10 Essential (primary) hypertension: Secondary | ICD-10-CM | POA: Diagnosis not present

## 2017-04-25 DIAGNOSIS — R634 Abnormal weight loss: Secondary | ICD-10-CM | POA: Diagnosis not present

## 2017-04-25 DIAGNOSIS — J449 Chronic obstructive pulmonary disease, unspecified: Secondary | ICD-10-CM | POA: Diagnosis not present

## 2017-04-25 DIAGNOSIS — Z85038 Personal history of other malignant neoplasm of large intestine: Secondary | ICD-10-CM | POA: Diagnosis not present

## 2017-04-30 ENCOUNTER — Ambulatory Visit
Admission: RE | Admit: 2017-04-30 | Discharge: 2017-04-30 | Disposition: A | Discharge status: Home / Self Care | Payer: Medicare Other | Source: Ambulatory Visit | Attending: Family Medicine | Admitting: Family Medicine

## 2017-04-30 DIAGNOSIS — R911 Solitary pulmonary nodule: Secondary | ICD-10-CM | POA: Diagnosis not present

## 2017-04-30 DIAGNOSIS — R634 Abnormal weight loss: Secondary | ICD-10-CM

## 2017-04-30 DIAGNOSIS — R161 Splenomegaly, not elsewhere classified: Secondary | ICD-10-CM | POA: Diagnosis not present

## 2017-04-30 IMAGING — CT CT CHEST W/ CM
2 of 5 series · 10 of 36 positions shown, 17 images · IV contrast (READICAT/WATER & [ID] ISOVUE 300)
Comparison: CT of the chest, abdomen and pelvis [DATE].

CLINICAL DATA: History of colon cancer and lung cancer. Status post
partial colectomy. Weight loss. Smoker.

EXAM:
CT CHEST, ABDOMEN, AND PELVIS WITH CONTRAST
TECHNIQUE: Multidetector CT imaging of the chest, abdomen and pelvis was
performed following the standard protocol during bolus
administration of intravenous contrast.
CONTRAST:  100mL [ST] IOPAMIDOL ([ST]) INJECTION 61%

[Series 601: coronal body · coronal · 1.32mm/px · 1 of 130 slices shown, 2 images]
[im 44/130  soft-tissue]
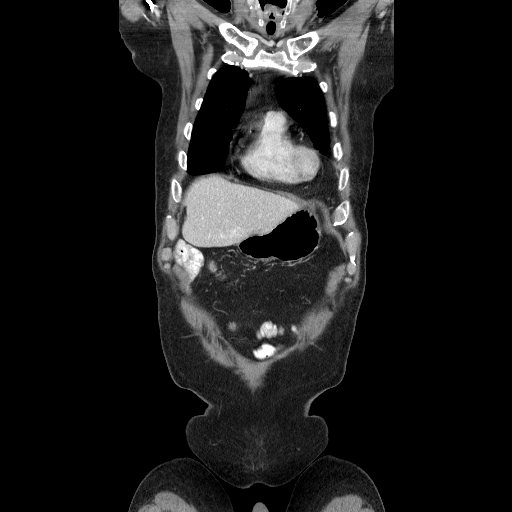
[im 44/130  bone]
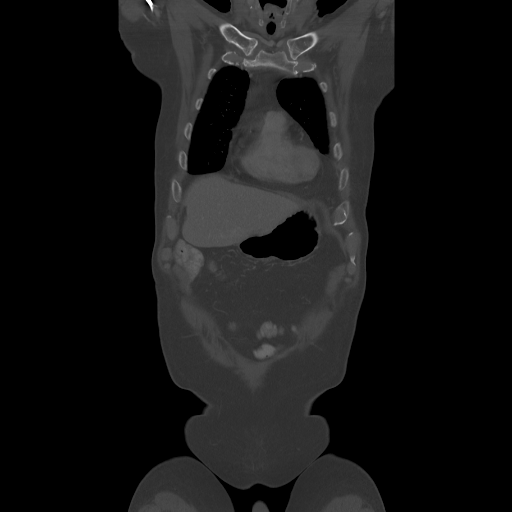

[Series 602: sagittal body · sagittal · 1.32mm/px · 9 of 153 slices shown, 15 images]
[im 16/153  soft-tissue]
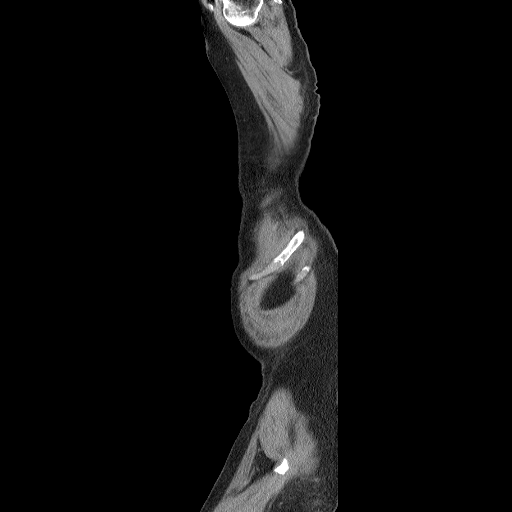
[im 16/153  lung]
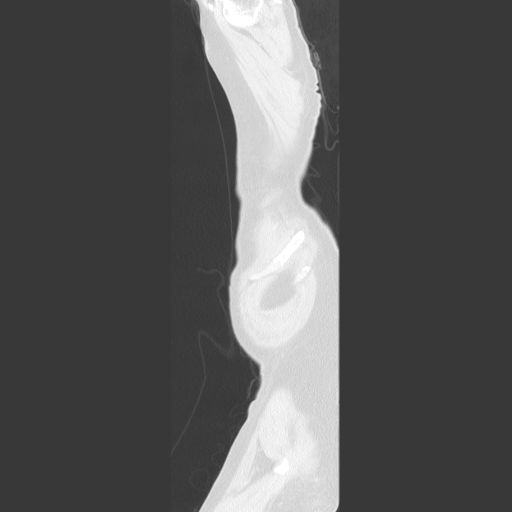
[im 16/153  bone]
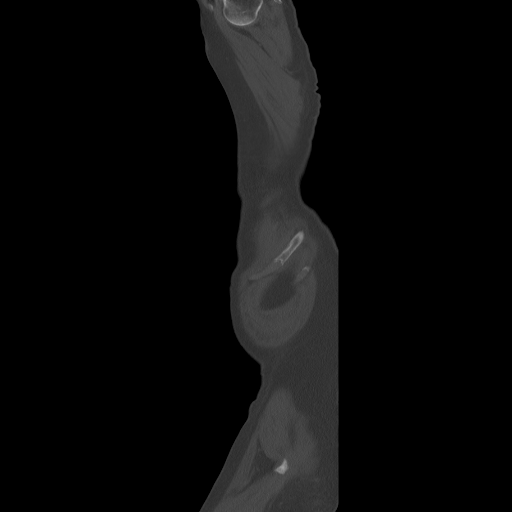
[im 31/153  soft-tissue]
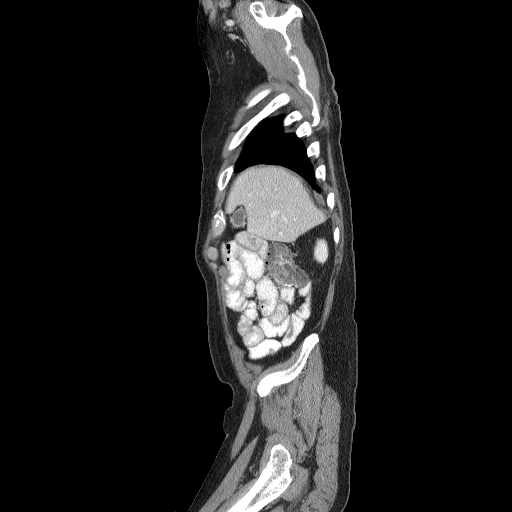
[im 31/153  lung]
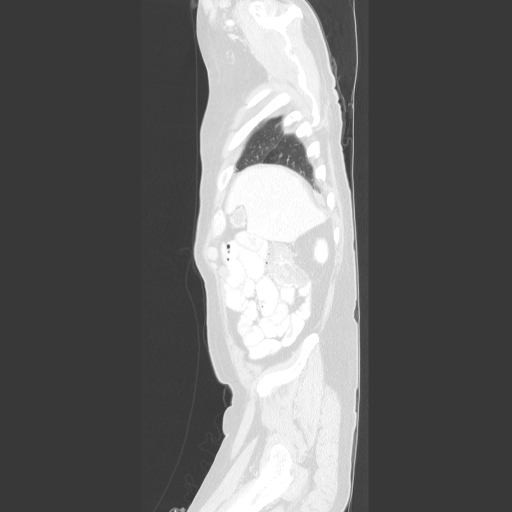
[im 46/153  soft-tissue]
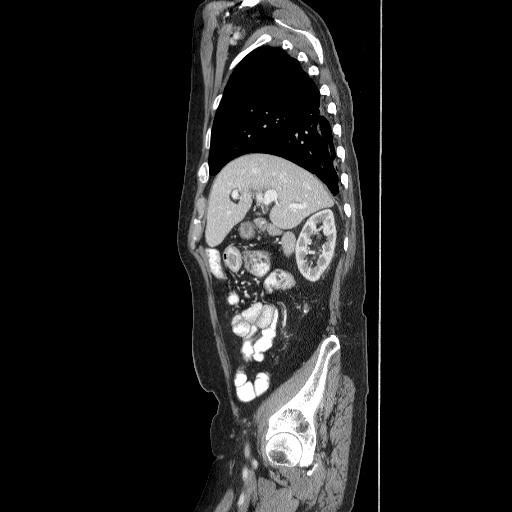
[im 46/153  lung]
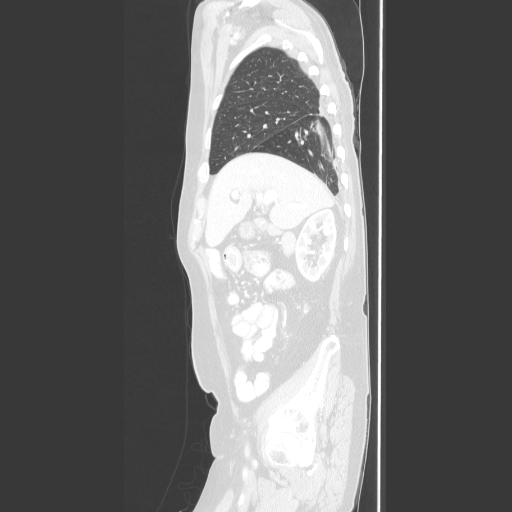
[im 61/153  soft-tissue]
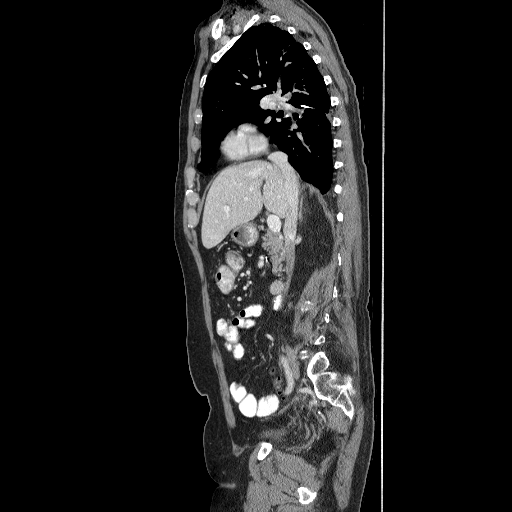
[im 61/153  lung]
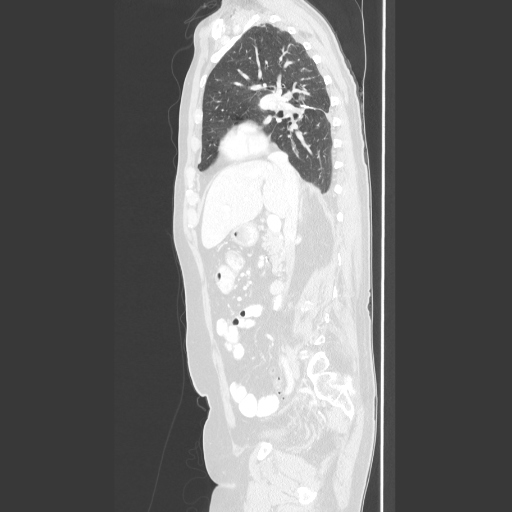
[im 77/153  soft-tissue]
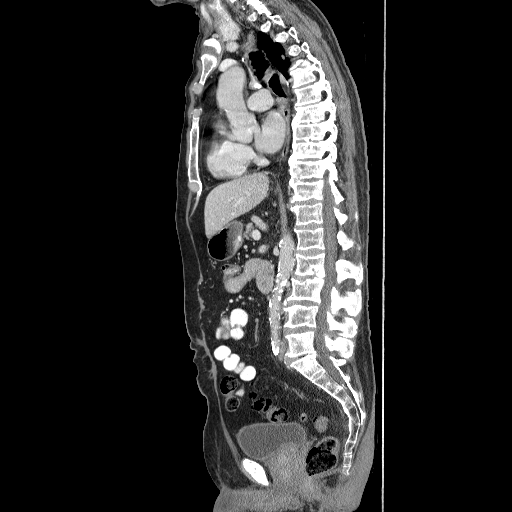
[im 92/153  soft-tissue]
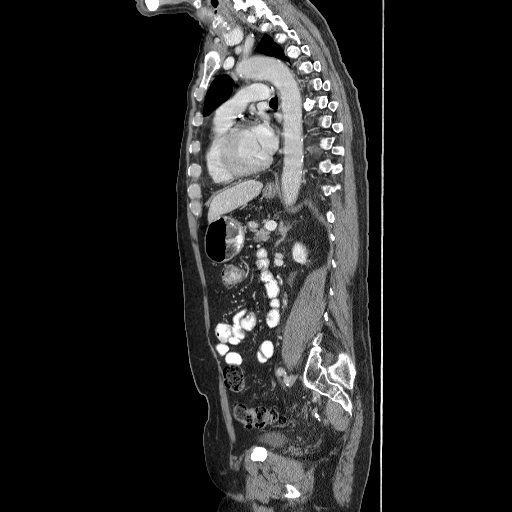
[im 107/153  soft-tissue]
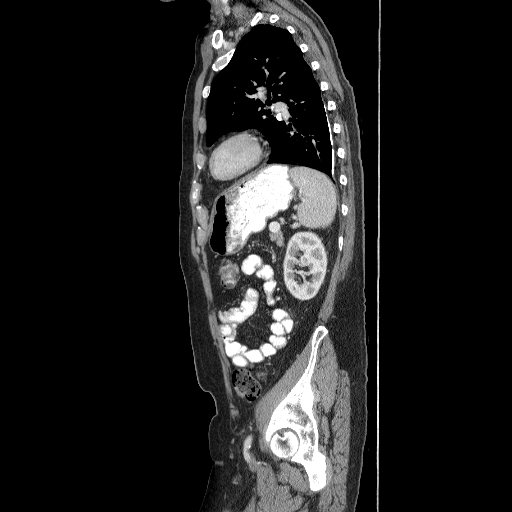
[im 122/153  soft-tissue]
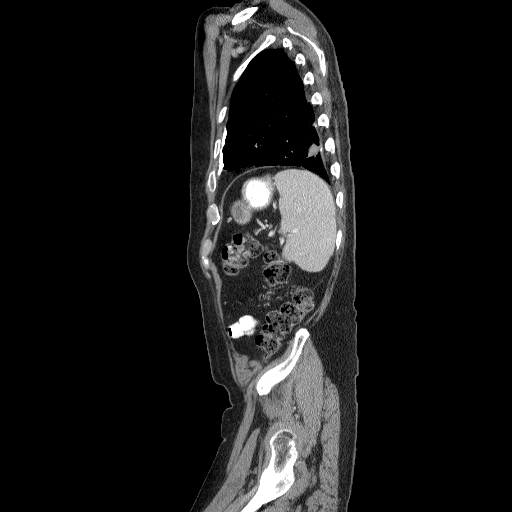
[im 137/153  soft-tissue]
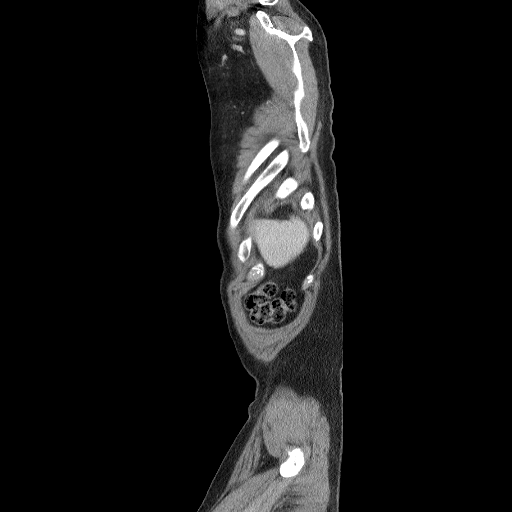
[im 137/153  bone]
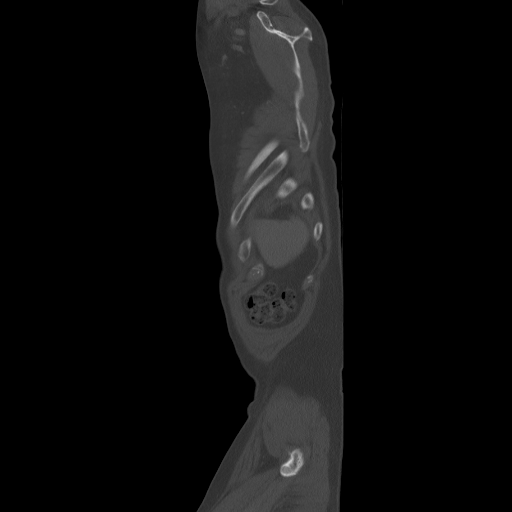

[10 of 36 positions shown; findings below may reference images not displayed]

FINDINGS: CT CHEST FINDINGS

Cardiovascular: Heart is borderline in size. Calcifications in the
left anterior descending coronary artery and right coronary artery.
Aorta is normal caliber with scattered aortic calcifications. No
aneurysm.

Mediastinum/Nodes: Calcified mediastinal and bilateral hilar lymph
nodes compatible with old granulomatous disease, stable. No
mediastinal, hilar or axillary adenopathy. Trachea and esophagus are
unremarkable. Thyroid unremarkable.

Lungs/Pleura: Left pleural calcifications are again noted,
unchanged. No pleural effusions. Postoperative changes from right
lower lobectomy. New 5 mm peripheral right upper lobe nodule on
image 66. Linear areas of atelectasis or scarring in the lower lobes
are stable. Rounded atelectasis noted in both lower lobes, stable.
No additional pulmonary nodules. Mild emphysema.

Musculoskeletal: Degenerative disc disease in the thoracic spine. No
acute or focal bony abnormality.

CT ABDOMEN PELVIS FINDINGS

Hepatobiliary: No focal hepatic abnormality. Gallbladder partially
contracted, grossly unremarkable.

Pancreas: No focal pancreatic mass or ductal dilatation.

Spleen: Spleen is enlarged with a craniocaudal length of 14.8 cm,
similar prior study. No focal abnormality.

Adrenals/Urinary Tract: Adrenal glands are normal. Renovascular
calcifications noted in the left hilum. No renal stones or
hydronephrosis. No focal renal abnormality.

Stomach/Bowel: Sigmoid diverticulosis. No active diverticulitis.
Moderate stool burden throughout the colon. Postoperative changes
noted in the right colon from prior partial colectomy. No evidence
of bowel obstruction.

Vascular/Lymphatic: Diffuse aortic and iliac calcifications. No
aneurysm or adenopathy.

Reproductive: Mildly prominent prostate. No visible focal
abnormality.

Other: No free fluid or free air.

Musculoskeletal: Mild degenerative changes in the lumbar spine. No
acute bony abnormality or focal bone lesion.
IMPRESSION: Postoperative changes from right lower lobectomy. New 5 mm
peripheral right upper lobe nodule, nonspecific. Recommend follow-up
CT in 6-12 months to assess stability.

Stable calcified pleural plaques on the left. Bilateral lower lobe
atelectasis and/or scarring is stable.

Coronary artery disease.

Old granulomatous disease.

Postoperative changes in the right colon. Moderate stool burden. No
evidence of obstruction.

Stable mild splenomegaly.

No acute findings or evidence of recurrent or metastatic disease in
the abdomen or pelvis.

## 2017-04-30 MED ORDER — IOPAMIDOL (ISOVUE-300) INJECTION 61%
100.0000 mL | Freq: Once | INTRAVENOUS | Status: AC | PRN
  Administered 2017-04-30: 100 mL via INTRAVENOUS

## 2017-07-26 DIAGNOSIS — I10 Essential (primary) hypertension: Secondary | ICD-10-CM | POA: Diagnosis not present

## 2017-07-26 DIAGNOSIS — Z85118 Personal history of other malignant neoplasm of bronchus and lung: Secondary | ICD-10-CM | POA: Diagnosis not present

## 2017-07-26 DIAGNOSIS — Z85038 Personal history of other malignant neoplasm of large intestine: Secondary | ICD-10-CM | POA: Diagnosis not present

## 2017-07-26 DIAGNOSIS — J449 Chronic obstructive pulmonary disease, unspecified: Secondary | ICD-10-CM | POA: Diagnosis not present

## 2017-07-26 DIAGNOSIS — Z23 Encounter for immunization: Secondary | ICD-10-CM | POA: Diagnosis not present

## 2017-11-05 ENCOUNTER — Other Ambulatory Visit: Payer: Self-pay | Admitting: Family Medicine

## 2017-11-05 DIAGNOSIS — R972 Elevated prostate specific antigen [PSA]: Secondary | ICD-10-CM | POA: Diagnosis not present

## 2017-11-05 DIAGNOSIS — Z85118 Personal history of other malignant neoplasm of bronchus and lung: Secondary | ICD-10-CM

## 2017-11-05 DIAGNOSIS — F17210 Nicotine dependence, cigarettes, uncomplicated: Secondary | ICD-10-CM | POA: Diagnosis not present

## 2017-11-05 DIAGNOSIS — R911 Solitary pulmonary nodule: Secondary | ICD-10-CM

## 2017-11-05 DIAGNOSIS — Z1389 Encounter for screening for other disorder: Secondary | ICD-10-CM | POA: Diagnosis not present

## 2017-11-05 DIAGNOSIS — Z85038 Personal history of other malignant neoplasm of large intestine: Secondary | ICD-10-CM | POA: Diagnosis not present

## 2017-11-05 DIAGNOSIS — I10 Essential (primary) hypertension: Secondary | ICD-10-CM | POA: Diagnosis not present

## 2017-11-05 DIAGNOSIS — Z0000 Encounter for general adult medical examination without abnormal findings: Secondary | ICD-10-CM | POA: Diagnosis not present

## 2017-11-05 DIAGNOSIS — J449 Chronic obstructive pulmonary disease, unspecified: Secondary | ICD-10-CM | POA: Diagnosis not present

## 2017-11-05 DIAGNOSIS — Z902 Acquired absence of lung [part of]: Secondary | ICD-10-CM

## 2017-11-14 ENCOUNTER — Ambulatory Visit
Admission: RE | Admit: 2017-11-14 | Discharge: 2017-11-14 | Disposition: A | Discharge status: Home / Self Care | Payer: Medicare Other | Source: Ambulatory Visit | Attending: Family Medicine | Admitting: Family Medicine

## 2017-11-14 DIAGNOSIS — R911 Solitary pulmonary nodule: Secondary | ICD-10-CM | POA: Diagnosis not present

## 2017-11-14 DIAGNOSIS — Z85118 Personal history of other malignant neoplasm of bronchus and lung: Secondary | ICD-10-CM

## 2017-11-14 DIAGNOSIS — Z902 Acquired absence of lung [part of]: Secondary | ICD-10-CM

## 2017-11-14 IMAGING — CT CT CHEST W/ CM
2 of 4 series · 11 of 36 positions shown, 13 images · IV contrast (iopamidol)
Comparison: Chest CT [DATE]

CLINICAL DATA: Followup pulmonary nodule. History of prior right
lower lobe lobectomy. History of colon cancer and lung cancer.

EXAM:
CT CHEST WITH CONTRAST
TECHNIQUE: Multidetector CT imaging of the chest was performed during
intravenous contrast administration.
CONTRAST:  75mL [X7] IOPAMIDOL ([X7]) INJECTION 61%

[Series 2: chest 2.00 br40 s3 ax · axial · 0.53mm/px · z∈[+1521,+1797]mm · 8 of 164 slices shown, 10 images]
[im 13/164  mediastinal]
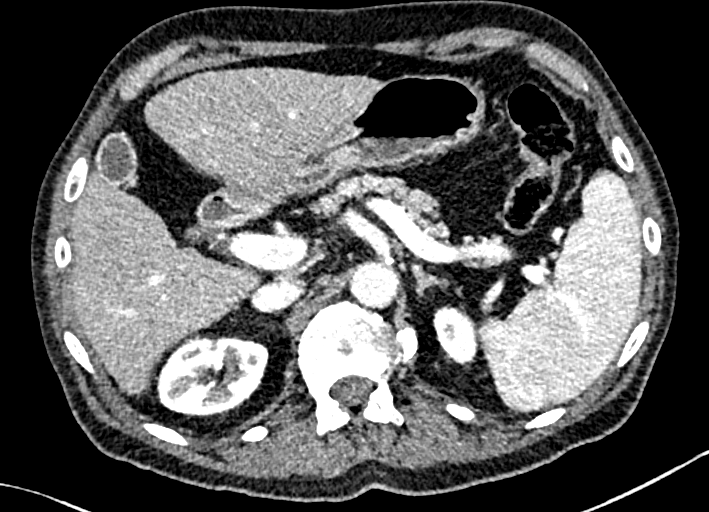
[im 13/164  lung]
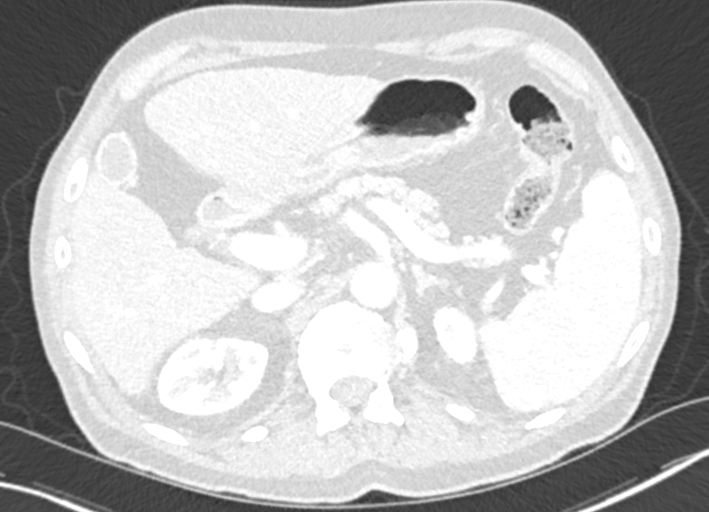
[im 38/164  lung]
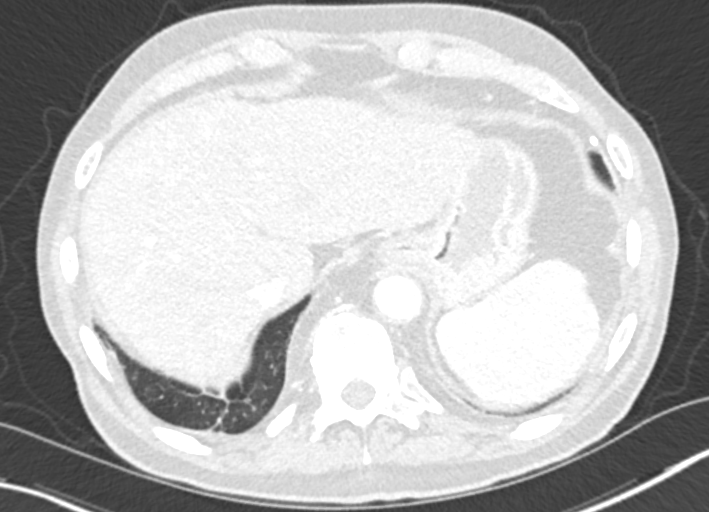
[im 51/164  lung]
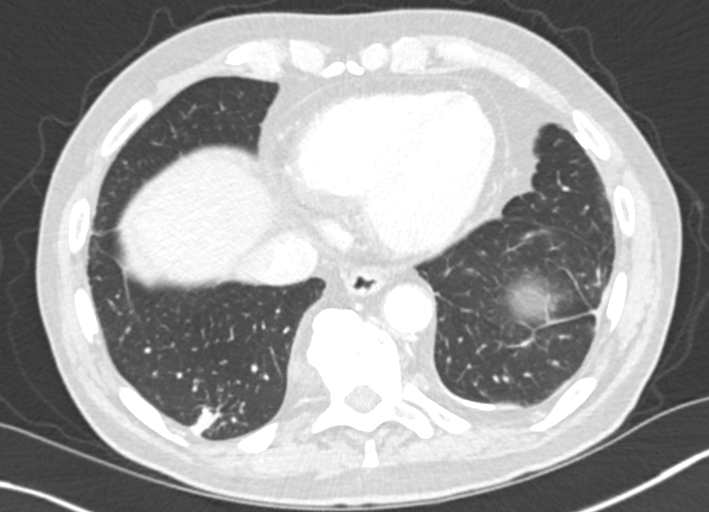
[im 76/164  lung]
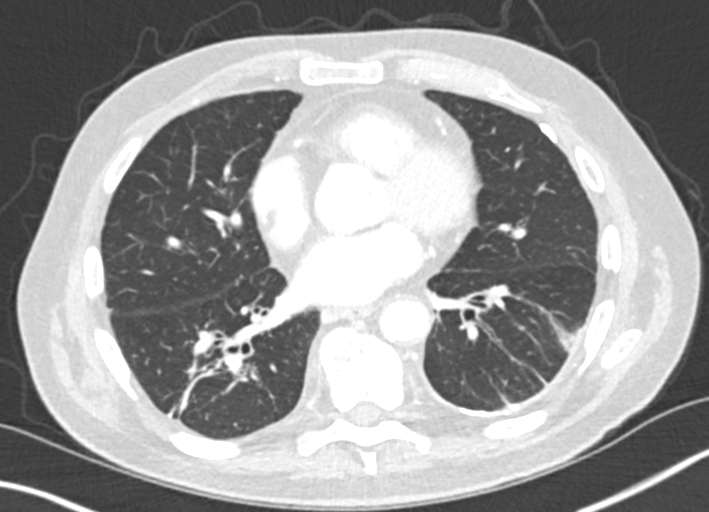
[im 88/164  mediastinal]
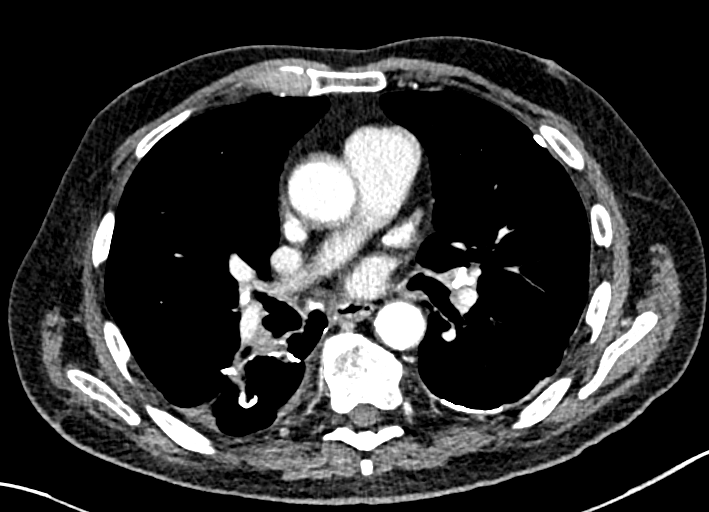
[im 88/164  lung]
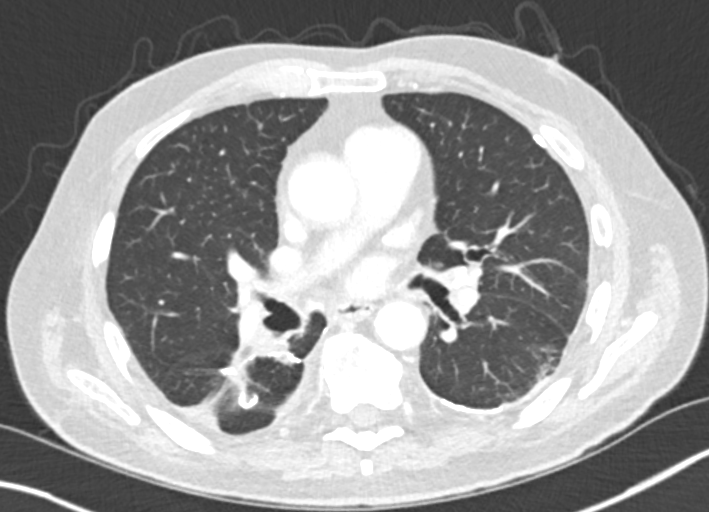
[im 113/164  lung]
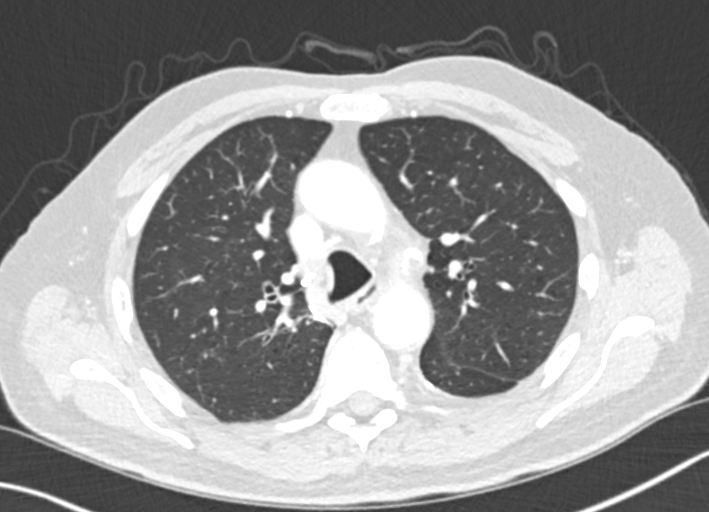
[im 126/164  lung]
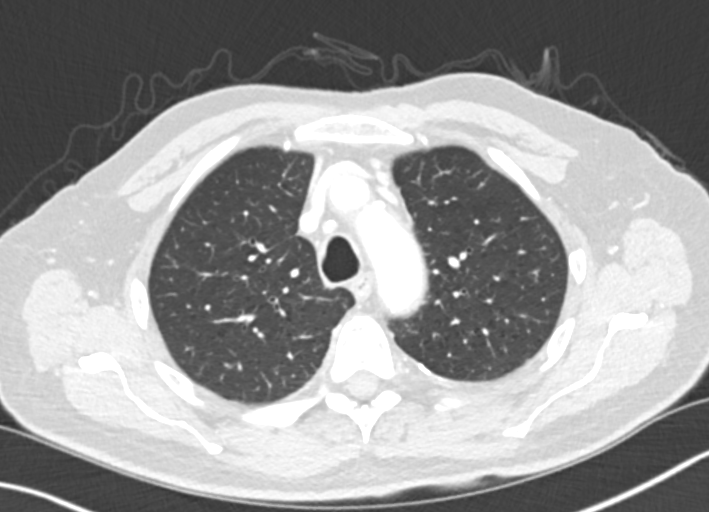
[im 151/164  lung]
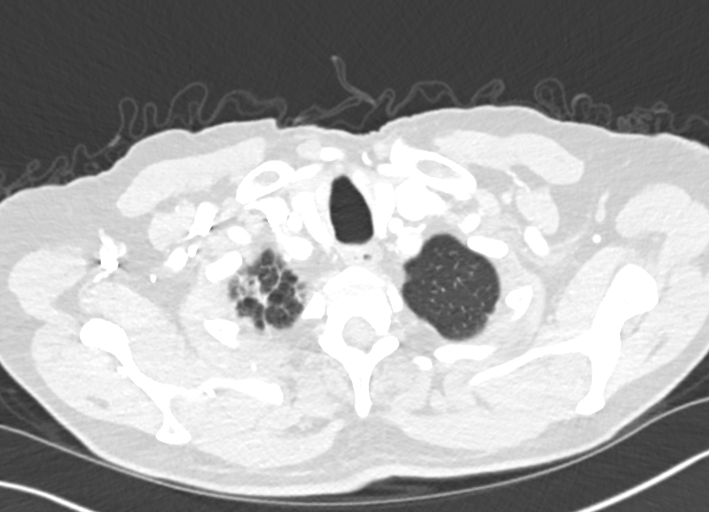

[Series 4: chest 2.00 br40 s3 cor · coronal · 0.64mm/px · 3 of 134 slices shown]
[im 27/134  lung]
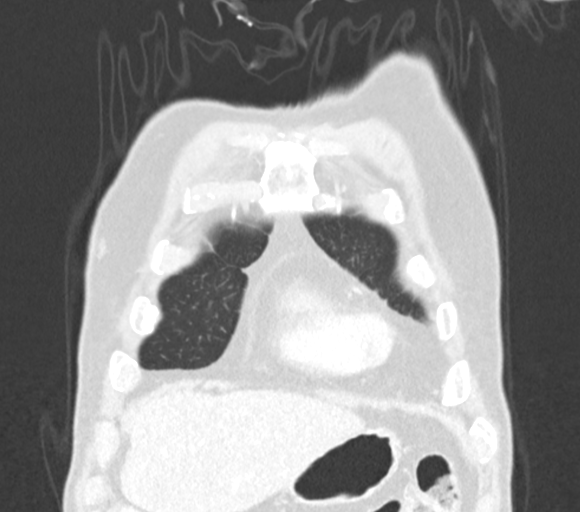
[im 54/134  lung]
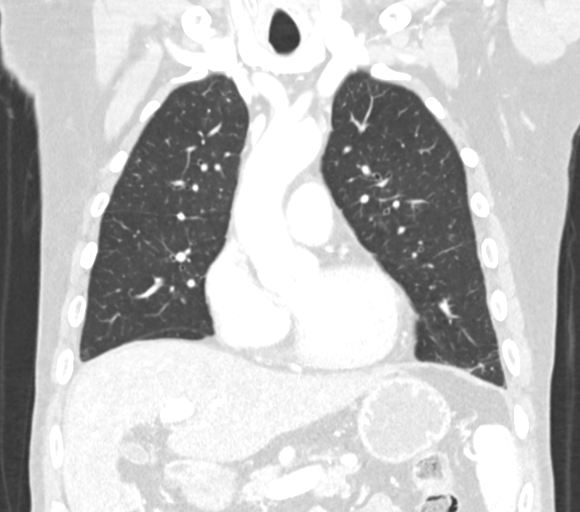
[im 80/134  lung]
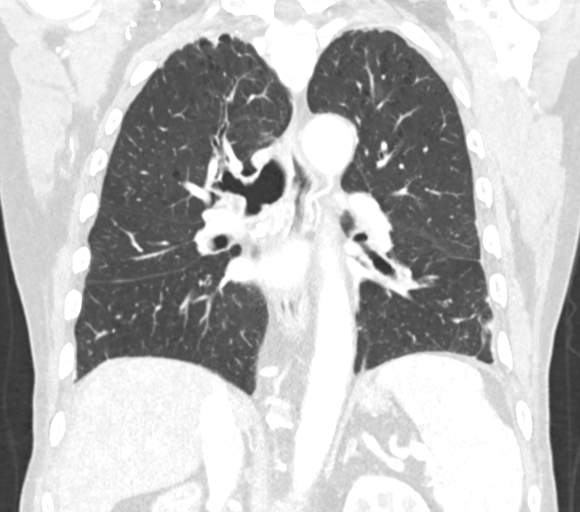

[11 of 36 positions shown; findings below may reference images not displayed]

FINDINGS: Cardiovascular: The heart is within normal limits in size and
stable. No pericardial effusion. Stable tortuosity, ectasia and
calcification of the thoracic aorta. Ductus diverticulum suspected.
No dissection. Stable three-vessel coronary artery calcifications.

Mediastinum/Nodes: Stable partially calcified mediastinal and hilar
lymph nodes suggesting remote granulomas disease. No new or
enlarging lymph nodes. The esophagus is grossly normal.

Lungs/Pleura: Stable emphysematous changes. Stable surgical changes
from a right lower lobe lobectomy. No findings suspicious for
recurrent tumor. 3.5 mm right upper lobe pulmonary nodule on image
number 87 has decreased in size since the prior examination where it
measured 4.5 mm.

No new pulmonary nodules. No acute overlying pulmonary process.
Stable probable postop scarring changes in the left lower lobe.
Stable left-sided pleural calcifications.

Upper Abdomen: No significant upper abdominal findings. Suspect
cirrhotic changes involving the liver with irregular liver contour,
right hepatic lobe atrophy and increased caudate to right lobe ratio
and splenomegaly. No ascites.

Advanced atherosclerotic calcifications involving the upper
abdominal aorta and branch vessels.

Musculoskeletal: No chest wall mass, supraclavicular or axillary
adenopathy. No significant bony findings.
IMPRESSION: 1. Stable postoperative changes from a right lower lobe lobectomy.
No findings for recurrent tumor.
2. Slight interval decrease in size of the right upper lobe
pulmonary nodule. No new pulmonary nodules.
3. Stable left basilar scarring changes.
4. Stable emphysematous changes.
5. Small scattered calcified mediastinal and hilar lymph nodes but
no mass or overt adenopathy.
6. Cirrhotic changes involving the liver and splenomegaly.
7. Advanced atherosclerotic calcifications involving the aorta and
branch vessels.

Aortic Atherosclerosis ([X7]-[X7]) and Emphysema ([X7]-[X7]).

## 2017-11-14 MED ORDER — IOPAMIDOL (ISOVUE-300) INJECTION 61%
75.0000 mL | Freq: Once | INTRAVENOUS | Status: AC | PRN
  Administered 2017-11-14: 75 mL via INTRAVENOUS

## 2018-01-09 DIAGNOSIS — R972 Elevated prostate specific antigen [PSA]: Secondary | ICD-10-CM | POA: Diagnosis not present

## 2018-01-09 DIAGNOSIS — N400 Enlarged prostate without lower urinary tract symptoms: Secondary | ICD-10-CM | POA: Diagnosis not present

## 2018-05-27 DIAGNOSIS — J439 Emphysema, unspecified: Secondary | ICD-10-CM | POA: Diagnosis not present

## 2018-05-27 DIAGNOSIS — R634 Abnormal weight loss: Secondary | ICD-10-CM | POA: Diagnosis not present

## 2018-05-27 DIAGNOSIS — R0981 Nasal congestion: Secondary | ICD-10-CM | POA: Diagnosis not present

## 2018-05-27 DIAGNOSIS — I700 Atherosclerosis of aorta: Secondary | ICD-10-CM | POA: Diagnosis not present

## 2018-05-27 DIAGNOSIS — Z23 Encounter for immunization: Secondary | ICD-10-CM | POA: Diagnosis not present

## 2018-05-27 DIAGNOSIS — I10 Essential (primary) hypertension: Secondary | ICD-10-CM | POA: Diagnosis not present

## 2018-09-12 DIAGNOSIS — H2512 Age-related nuclear cataract, left eye: Secondary | ICD-10-CM | POA: Diagnosis not present

## 2018-09-12 DIAGNOSIS — H524 Presbyopia: Secondary | ICD-10-CM | POA: Diagnosis not present

## 2018-09-12 DIAGNOSIS — Z961 Presence of intraocular lens: Secondary | ICD-10-CM | POA: Diagnosis not present

## 2018-09-12 DIAGNOSIS — H1851 Endothelial corneal dystrophy: Secondary | ICD-10-CM | POA: Diagnosis not present

## 2018-10-08 DIAGNOSIS — H2512 Age-related nuclear cataract, left eye: Secondary | ICD-10-CM | POA: Diagnosis not present

## 2018-10-08 DIAGNOSIS — H25812 Combined forms of age-related cataract, left eye: Secondary | ICD-10-CM | POA: Diagnosis not present

## 2018-10-18 ENCOUNTER — Emergency Department (HOSPITAL_COMMUNITY): Payer: Medicare Other

## 2018-10-18 ENCOUNTER — Encounter (HOSPITAL_COMMUNITY)
Admission: EM | Disposition: A | Discharge status: Home Health | Payer: Self-pay | Source: Home / Self Care | Attending: Family Medicine

## 2018-10-18 ENCOUNTER — Inpatient Hospital Stay (HOSPITAL_COMMUNITY): Payer: Medicare Other

## 2018-10-18 ENCOUNTER — Encounter (HOSPITAL_COMMUNITY): Payer: Self-pay | Admitting: Emergency Medicine

## 2018-10-18 ENCOUNTER — Other Ambulatory Visit: Payer: Self-pay

## 2018-10-18 ENCOUNTER — Inpatient Hospital Stay (HOSPITAL_COMMUNITY): Payer: Medicare Other | Admitting: Anesthesiology

## 2018-10-18 ENCOUNTER — Inpatient Hospital Stay (HOSPITAL_COMMUNITY)
Admission: EM | Admit: 2018-10-18 | Discharge: 2018-10-20 | DRG: 470 | Disposition: A | Discharge status: Home Health | Payer: Medicare Other | Attending: Family Medicine | Admitting: Family Medicine

## 2018-10-18 DIAGNOSIS — W19XXXA Unspecified fall, initial encounter: Secondary | ICD-10-CM

## 2018-10-18 DIAGNOSIS — I10 Essential (primary) hypertension: Secondary | ICD-10-CM | POA: Diagnosis present

## 2018-10-18 DIAGNOSIS — D638 Anemia in other chronic diseases classified elsewhere: Secondary | ICD-10-CM | POA: Diagnosis not present

## 2018-10-18 DIAGNOSIS — J449 Chronic obstructive pulmonary disease, unspecified: Secondary | ICD-10-CM | POA: Diagnosis present

## 2018-10-18 DIAGNOSIS — S72041A Displaced fracture of base of neck of right femur, initial encounter for closed fracture: Secondary | ICD-10-CM

## 2018-10-18 DIAGNOSIS — Z96641 Presence of right artificial hip joint: Secondary | ICD-10-CM | POA: Diagnosis not present

## 2018-10-18 DIAGNOSIS — Y9289 Other specified places as the place of occurrence of the external cause: Secondary | ICD-10-CM | POA: Diagnosis not present

## 2018-10-18 DIAGNOSIS — D529 Folate deficiency anemia, unspecified: Secondary | ICD-10-CM | POA: Diagnosis not present

## 2018-10-18 DIAGNOSIS — F17210 Nicotine dependence, cigarettes, uncomplicated: Secondary | ICD-10-CM | POA: Diagnosis present

## 2018-10-18 DIAGNOSIS — D513 Other dietary vitamin B12 deficiency anemia: Secondary | ICD-10-CM | POA: Diagnosis present

## 2018-10-18 DIAGNOSIS — D696 Thrombocytopenia, unspecified: Secondary | ICD-10-CM | POA: Diagnosis present

## 2018-10-18 DIAGNOSIS — C3411 Malignant neoplasm of upper lobe, right bronchus or lung: Secondary | ICD-10-CM | POA: Diagnosis not present

## 2018-10-18 DIAGNOSIS — M25559 Pain in unspecified hip: Secondary | ICD-10-CM

## 2018-10-18 DIAGNOSIS — Z85118 Personal history of other malignant neoplasm of bronchus and lung: Secondary | ICD-10-CM

## 2018-10-18 DIAGNOSIS — Y939 Activity, unspecified: Secondary | ICD-10-CM | POA: Diagnosis not present

## 2018-10-18 DIAGNOSIS — C189 Malignant neoplasm of colon, unspecified: Secondary | ICD-10-CM

## 2018-10-18 DIAGNOSIS — Z471 Aftercare following joint replacement surgery: Secondary | ICD-10-CM | POA: Diagnosis not present

## 2018-10-18 DIAGNOSIS — W010XXA Fall on same level from slipping, tripping and stumbling without subsequent striking against object, initial encounter: Secondary | ICD-10-CM | POA: Diagnosis present

## 2018-10-18 DIAGNOSIS — M25551 Pain in right hip: Secondary | ICD-10-CM | POA: Diagnosis not present

## 2018-10-18 DIAGNOSIS — S299XXA Unspecified injury of thorax, initial encounter: Secondary | ICD-10-CM | POA: Diagnosis not present

## 2018-10-18 DIAGNOSIS — R52 Pain, unspecified: Secondary | ICD-10-CM | POA: Diagnosis not present

## 2018-10-18 DIAGNOSIS — S72009A Fracture of unspecified part of neck of unspecified femur, initial encounter for closed fracture: Secondary | ICD-10-CM | POA: Diagnosis present

## 2018-10-18 DIAGNOSIS — C3410 Malignant neoplasm of upper lobe, unspecified bronchus or lung: Secondary | ICD-10-CM | POA: Diagnosis present

## 2018-10-18 DIAGNOSIS — Z9221 Personal history of antineoplastic chemotherapy: Secondary | ICD-10-CM | POA: Diagnosis not present

## 2018-10-18 DIAGNOSIS — S72001A Fracture of unspecified part of neck of right femur, initial encounter for closed fracture: Secondary | ICD-10-CM | POA: Diagnosis not present

## 2018-10-18 DIAGNOSIS — Z85038 Personal history of other malignant neoplasm of large intestine: Secondary | ICD-10-CM

## 2018-10-18 DIAGNOSIS — D62 Acute posthemorrhagic anemia: Secondary | ICD-10-CM | POA: Diagnosis not present

## 2018-10-18 DIAGNOSIS — M25572 Pain in left ankle and joints of left foot: Secondary | ICD-10-CM | POA: Diagnosis not present

## 2018-10-18 HISTORY — DX: Personal history of antineoplastic chemotherapy: Z92.21

## 2018-10-18 HISTORY — PX: TOTAL HIP ARTHROPLASTY: SHX124

## 2018-10-18 HISTORY — DX: Essential (primary) hypertension: I10

## 2018-10-18 LAB — CBC WITH DIFFERENTIAL/PLATELET
Abs Immature Granulocytes: 0.05 10*3/uL (ref 0.00–0.07)
Basophils Absolute: 0 10*3/uL (ref 0.0–0.1)
Basophils Relative: 0 %
Eosinophils Absolute: 0.1 10*3/uL (ref 0.0–0.5)
Eosinophils Relative: 2 %
HCT: 46.1 % (ref 39.0–52.0)
Hemoglobin: 15.4 g/dL (ref 13.0–17.0)
Immature Granulocytes: 1 %
Lymphocytes Relative: 20 %
Lymphs Abs: 1.2 10*3/uL (ref 0.7–4.0)
MCH: 35 pg — ABNORMAL HIGH (ref 26.0–34.0)
MCHC: 33.4 g/dL (ref 30.0–36.0)
MCV: 104.8 fL — ABNORMAL HIGH (ref 80.0–100.0)
Monocytes Absolute: 0.6 10*3/uL (ref 0.1–1.0)
Monocytes Relative: 10 %
Neutro Abs: 4 10*3/uL (ref 1.7–7.7)
Neutrophils Relative %: 67 %
Platelets: 120 10*3/uL — ABNORMAL LOW (ref 150–400)
RBC: 4.4 MIL/uL (ref 4.22–5.81)
RDW: 13.9 % (ref 11.5–15.5)
WBC: 6 10*3/uL (ref 4.0–10.5)
nRBC: 0 % (ref 0.0–0.2)

## 2018-10-18 LAB — BASIC METABOLIC PANEL
Anion gap: 7 (ref 5–15)
BUN: 13 mg/dL (ref 8–23)
CO2: 27 mmol/L (ref 22–32)
Calcium: 8.8 mg/dL — ABNORMAL LOW (ref 8.9–10.3)
Chloride: 106 mmol/L (ref 98–111)
Creatinine, Ser: 0.77 mg/dL (ref 0.61–1.24)
GFR calc Af Amer: >60 mL/min (ref >60)
GFR calc non Af Amer: >60 mL/min (ref >60)
Glucose, Bld: 98 mg/dL (ref 70–99)
Potassium: 3.9 mmol/L (ref 3.5–5.1)
Sodium: 140 mmol/L (ref 135–145)

## 2018-10-18 IMAGING — DX DG CHEST 1V PORT
2 series · 2 of 2 positions shown · non-contrast
Comparison: None.

CLINICAL DATA: Status post fall.

EXAM:
PORTABLE CHEST 1 VIEW

[chest ap (1 of 2)]
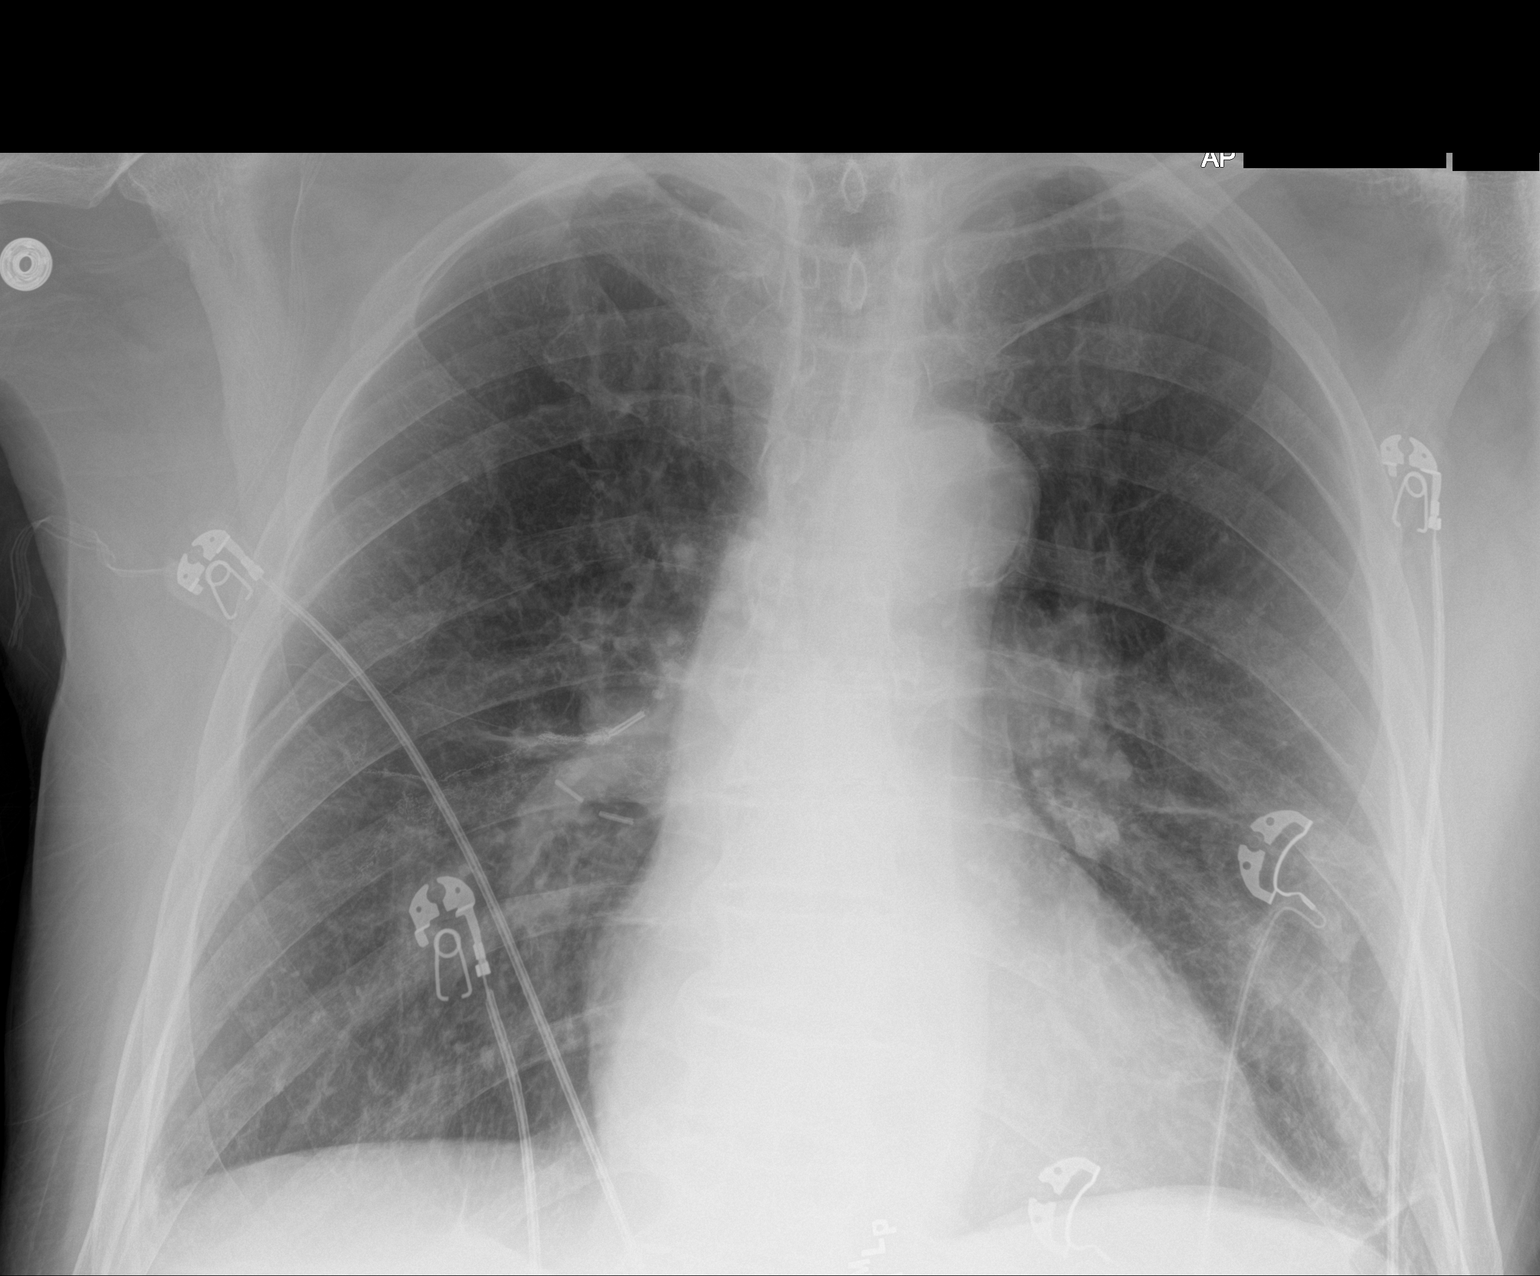

[chest ap (2 of 2)]
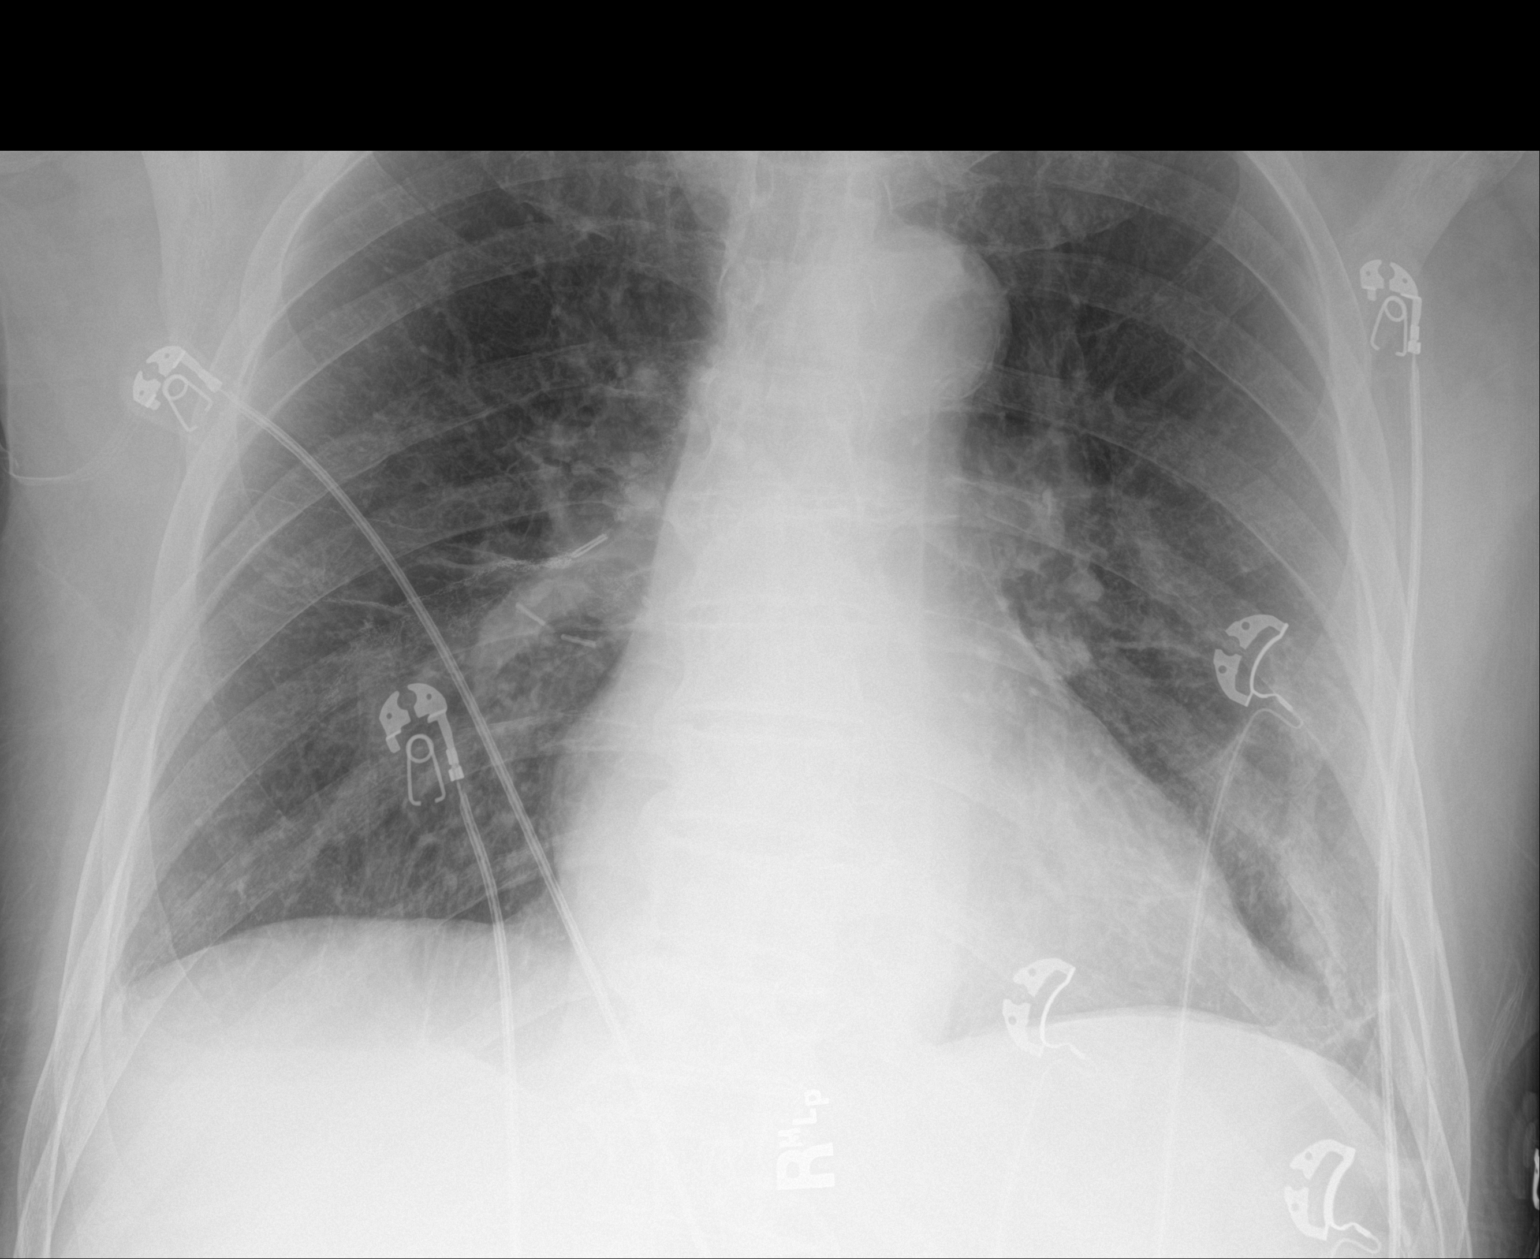

[2 of 2 positions shown; findings below may reference images not displayed]

FINDINGS: The heart size and mediastinal contours are stable. Surgical clips
project over the right hilum. Scarring of lateral left lung base is
unchanged. There is no focal pneumonia or pleural effusion. No
pulmonary edema is noted. The visualized skeletal structures are
unremarkable.
IMPRESSION: No active cardiopulmonary disease.

## 2018-10-18 IMAGING — DX DG PORTABLE PELVIS
1 series · 1 of 1 positions shown · non-contrast
Comparison: Preoperative radiograph earlier this day.

CLINICAL DATA: Status post right total hip arthroplasty.

EXAM:
PORTABLE PELVIS 1-2 VIEWS

[pelvis ap]
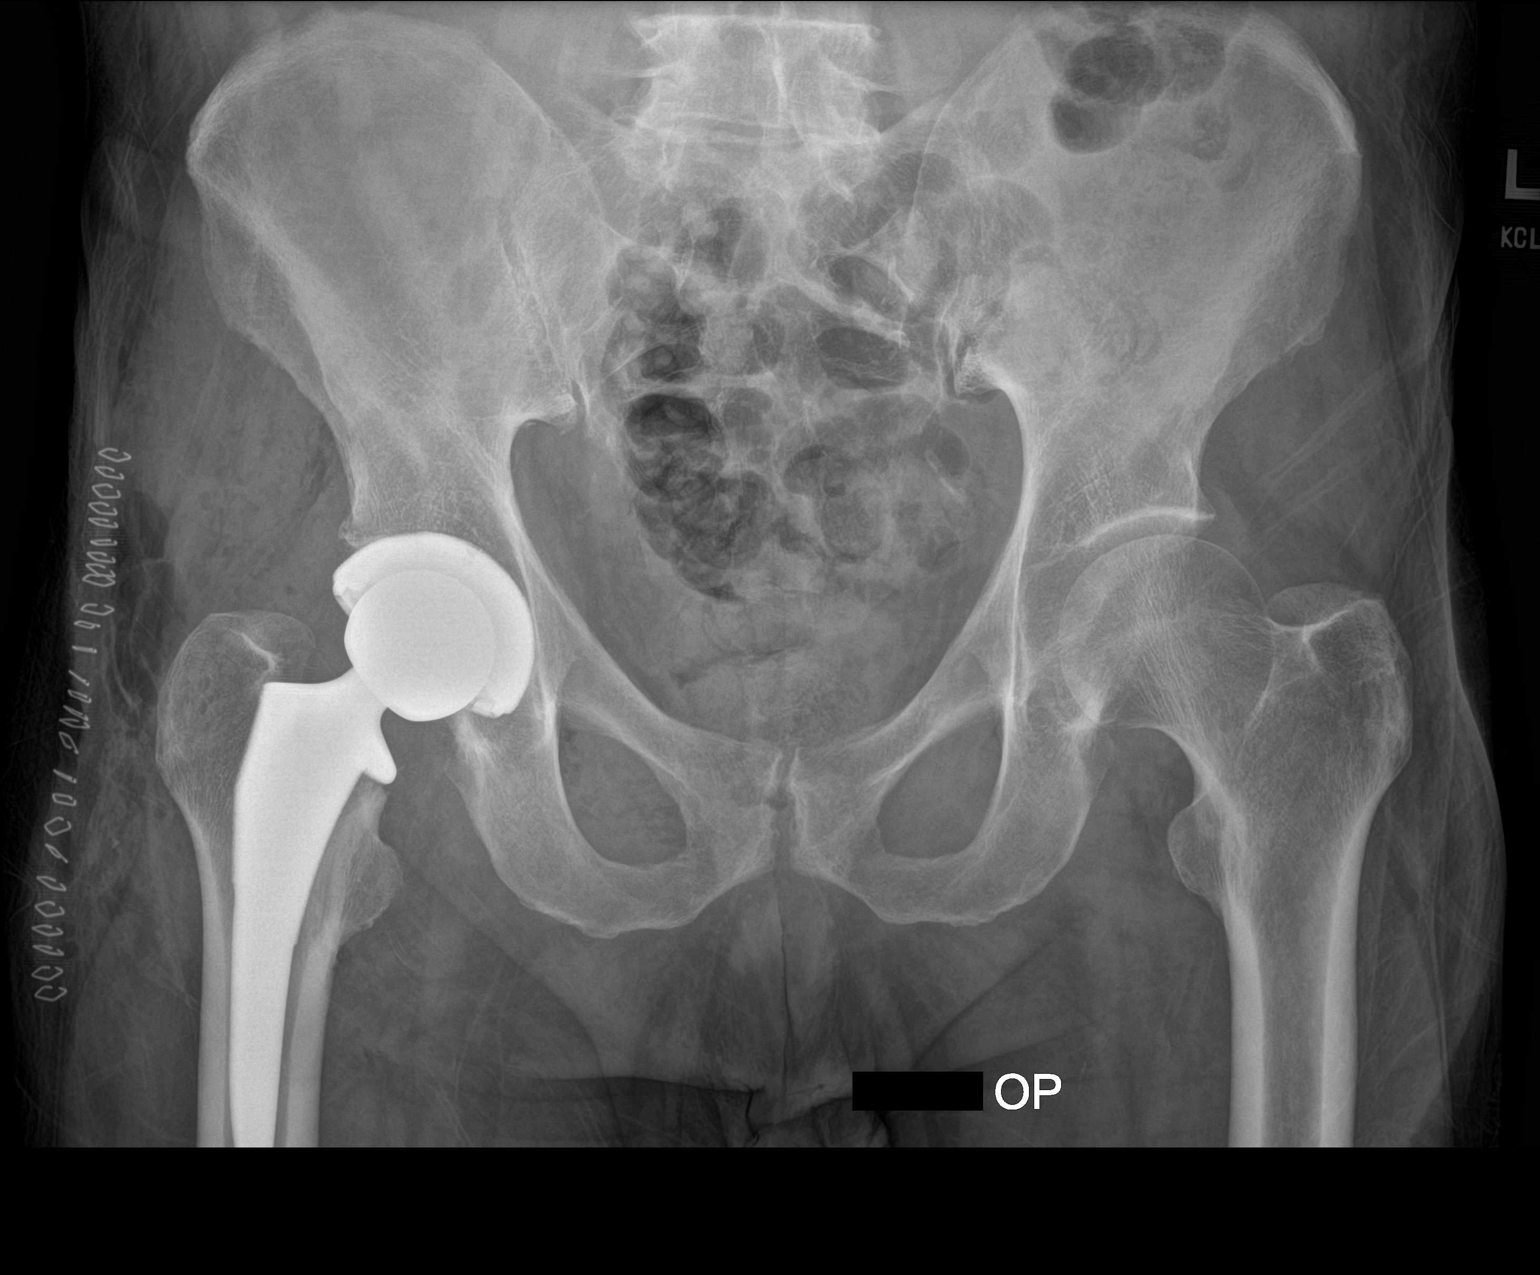

[1 of 1 positions shown; findings below may reference images not displayed]

FINDINGS: Right hip arthroplasty in expected alignment. No periprosthetic
lucency or fracture. Recent postsurgical change includes air and
edema in the subcutaneous tissues and lateral skin staples. No other
change from preoperative imaging.
IMPRESSION: Right hip arthroplasty without immediate postoperative complication.

## 2018-10-18 IMAGING — CR DG HIP (WITH OR WITHOUT PELVIS) 2-3V*R*
3 series · 3 of 3 positions shown · non-contrast
Comparison: None.

CLINICAL DATA: Right hip pain secondary to a fall today.

EXAM:
DG HIP (WITH OR WITHOUT PELVIS) 2-3V RIGHT

[x hip ap right]
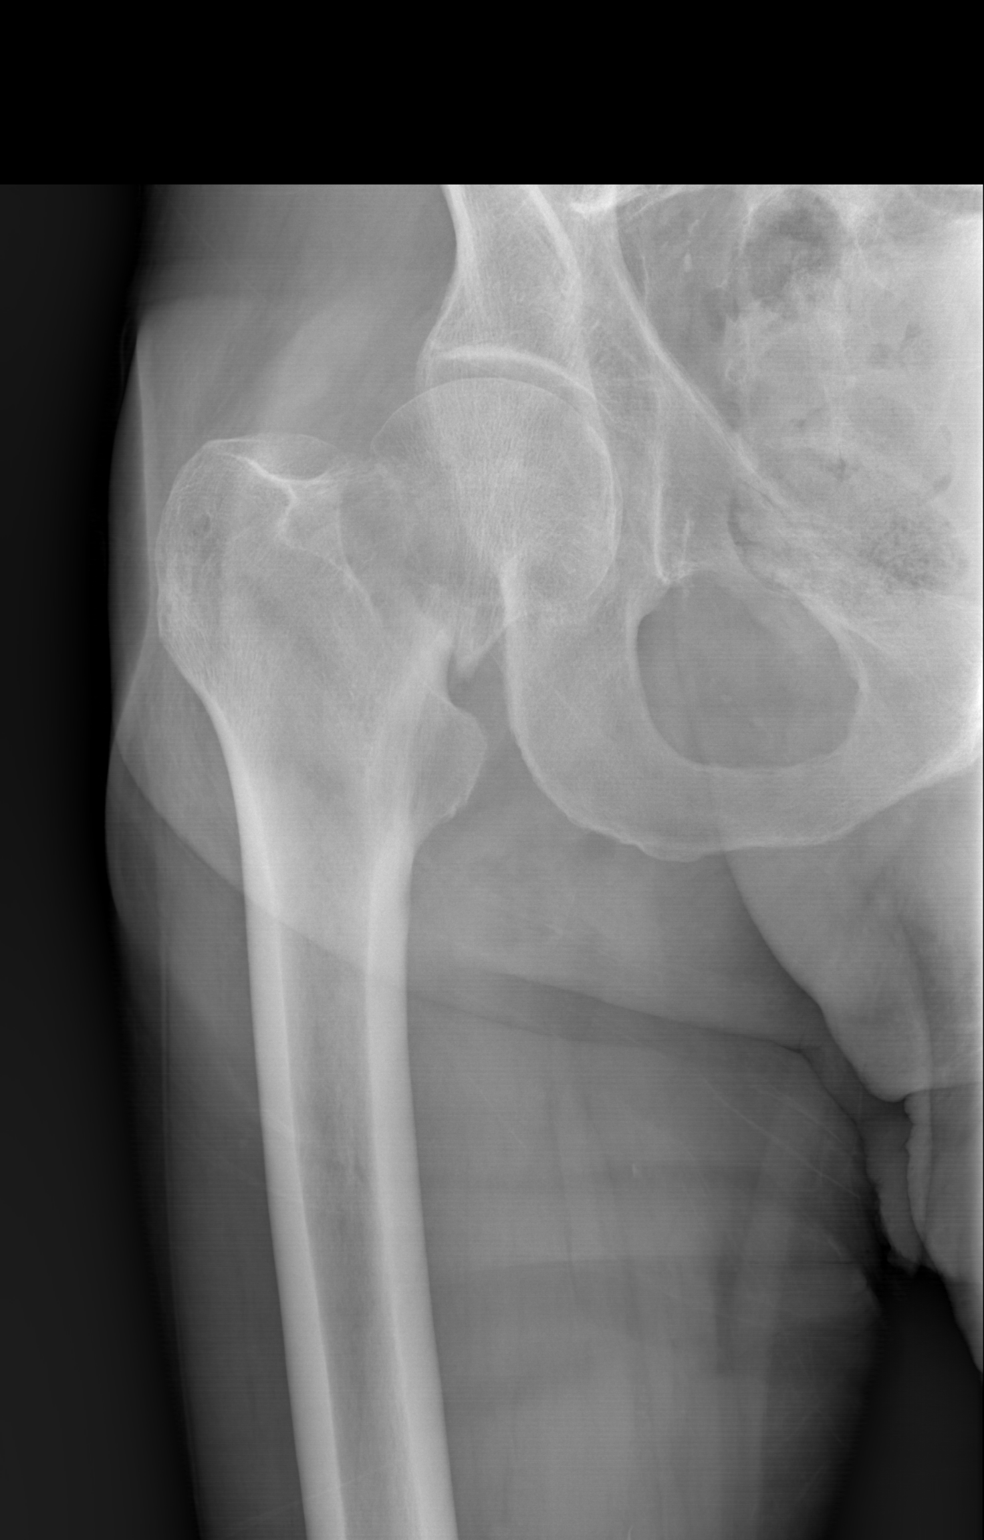

[x pelvis]
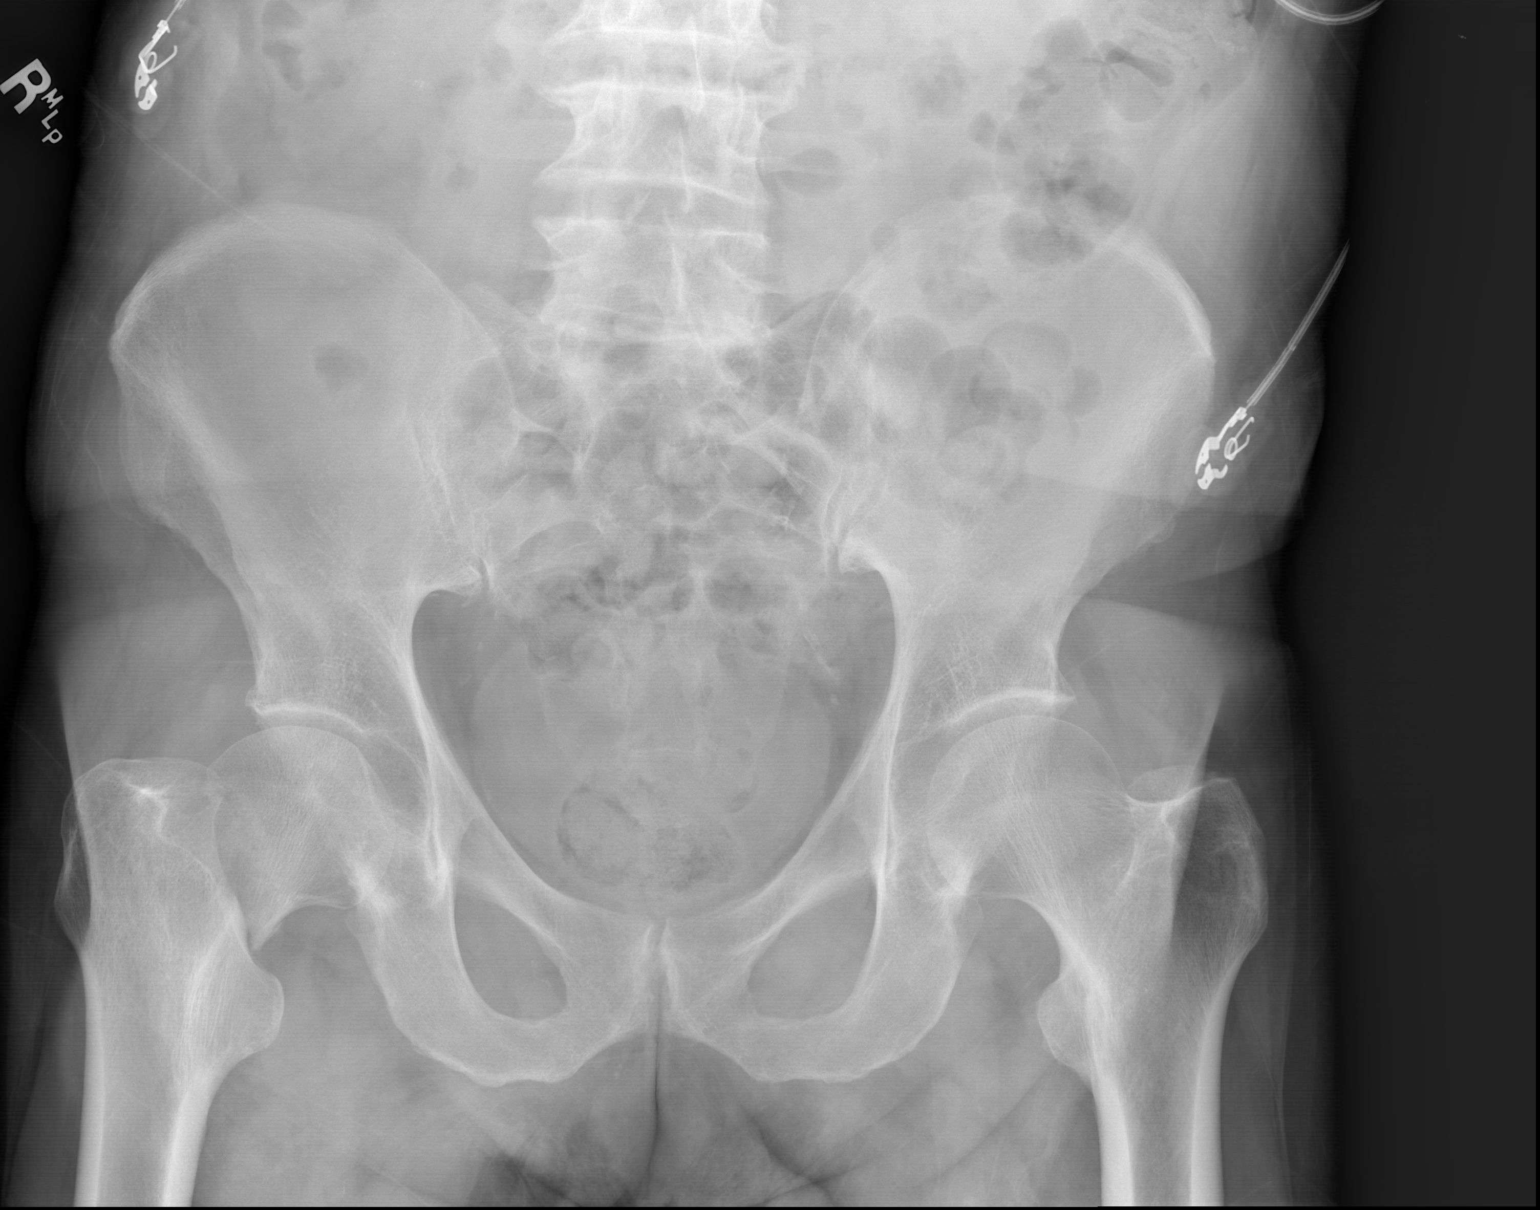

[w hip lat right]
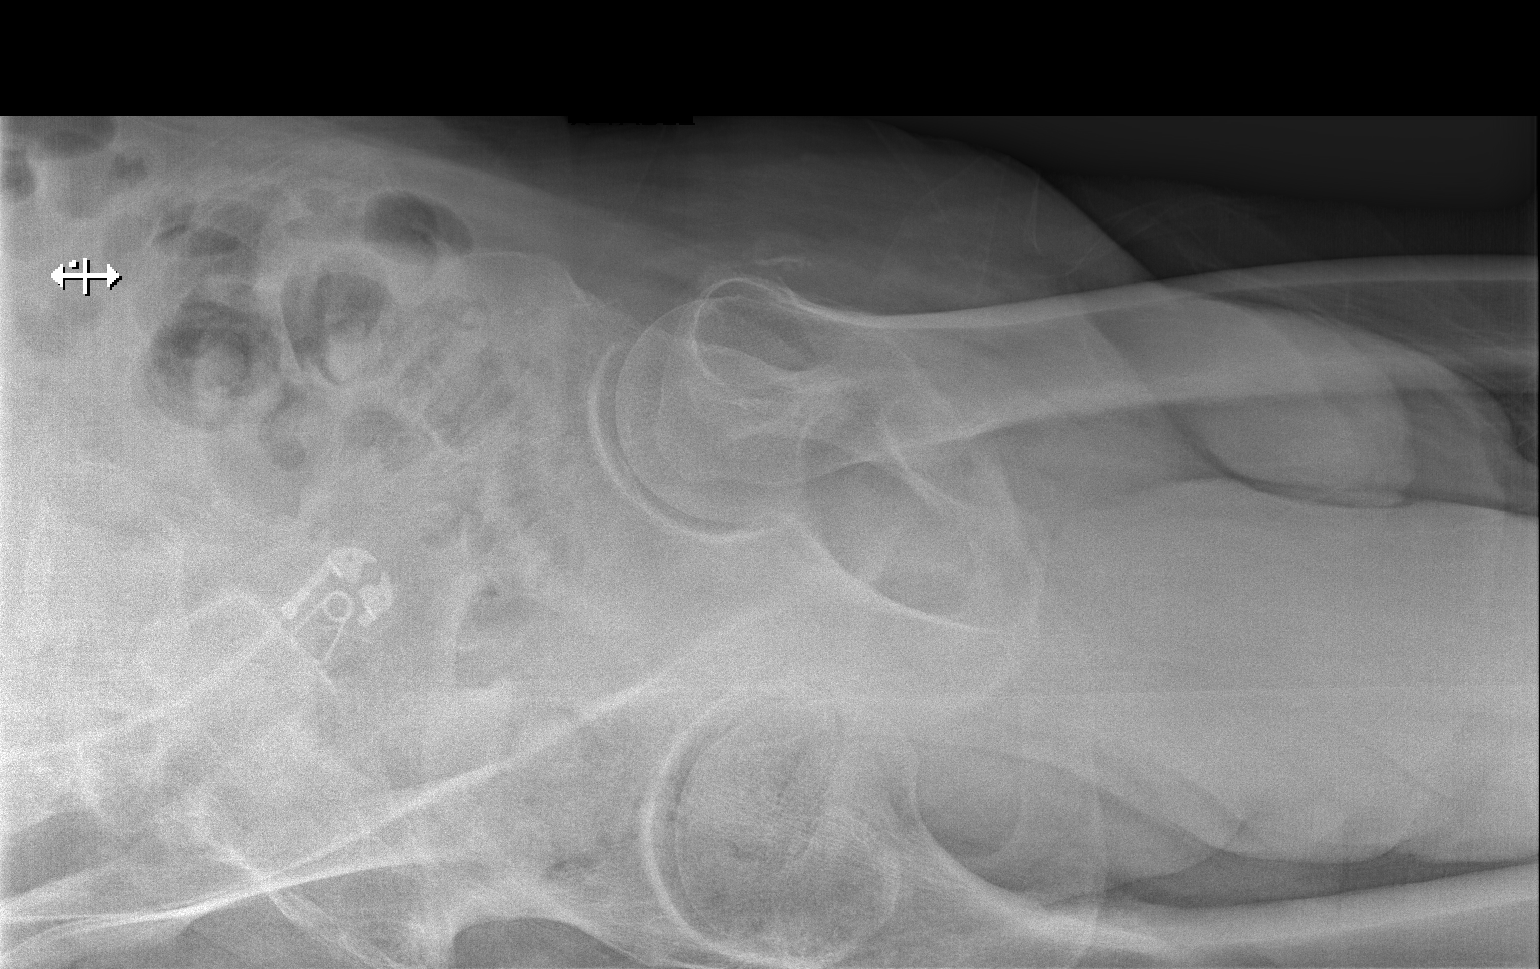

[3 of 3 positions shown; findings below may reference images not displayed]

FINDINGS: There is a slightly displaced fracture through the right femoral
neck. No significant angulation. No other abnormality.
IMPRESSION: Right femoral neck fracture.

## 2018-10-18 IMAGING — RF DG C-ARM 61-120 MIN
1 series · 6 of 6 positions shown · non-contrast
Comparison: [DATE]

CLINICAL DATA: Right femur fracture

EXAM:
OPERATIVE right HIP (WITH PELVIS IF PERFORMED) 6 VIEWS
TECHNIQUE: Fluoroscopic spot image(s) were submitted for interpretation
post-operatively.

[Series 1: unknown protocol · 0.20mm/px · 6 of 6 slices shown]
[im 1/6]
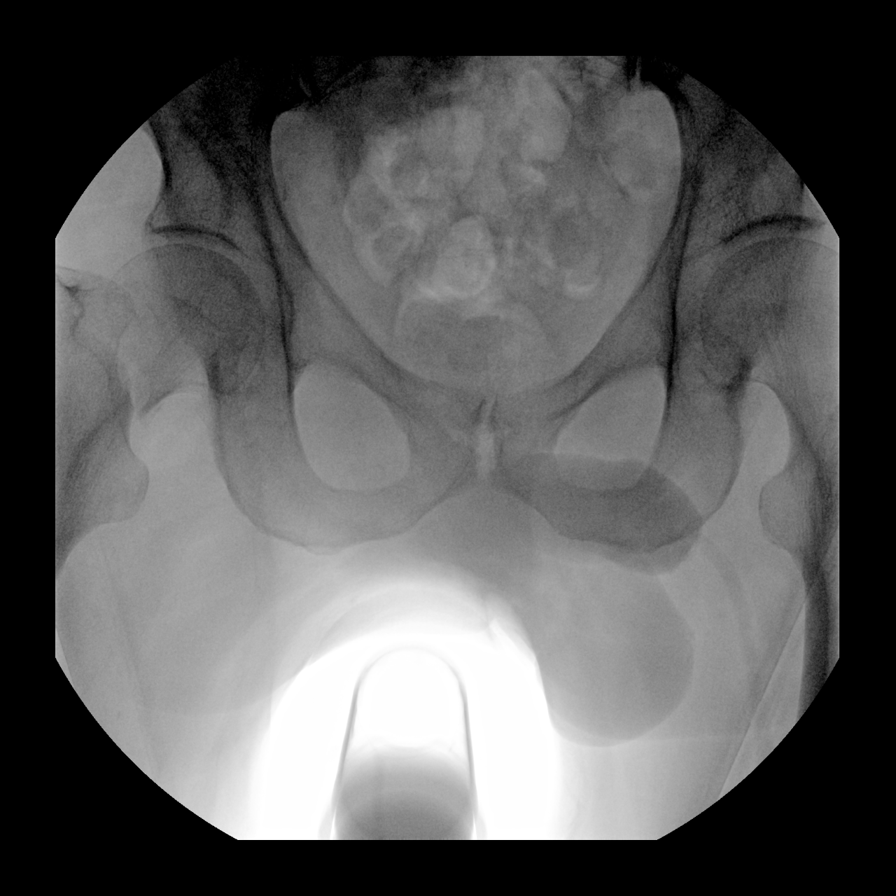
[im 2/6]
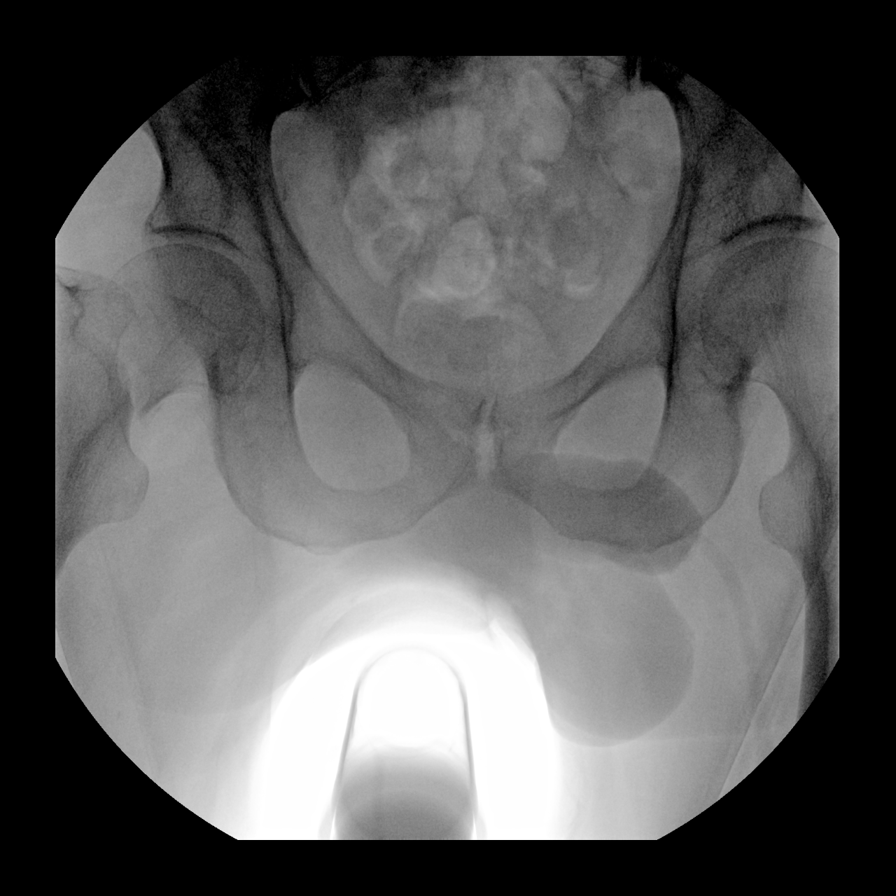
[im 3/6]
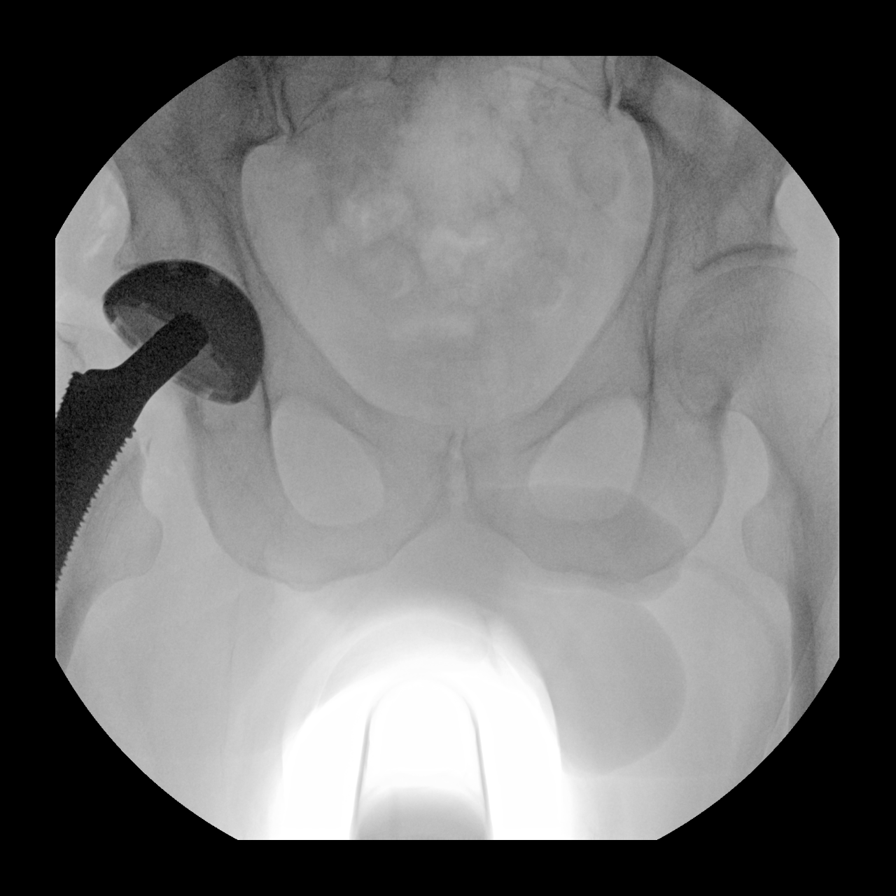
[im 4/6]
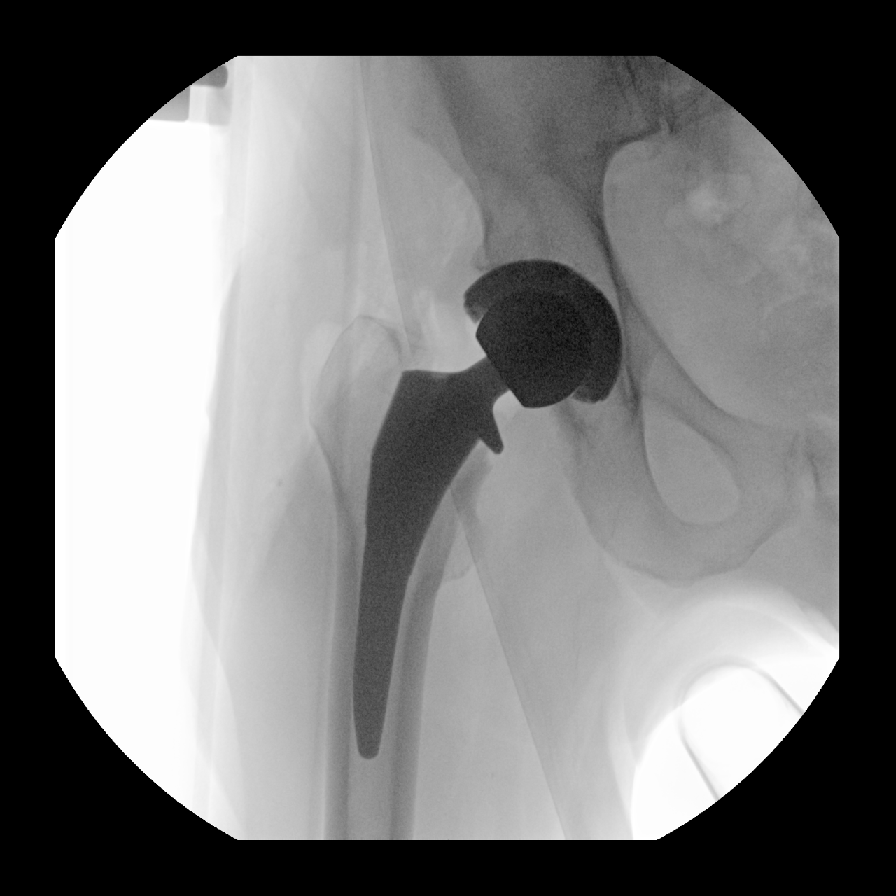
[im 5/6]
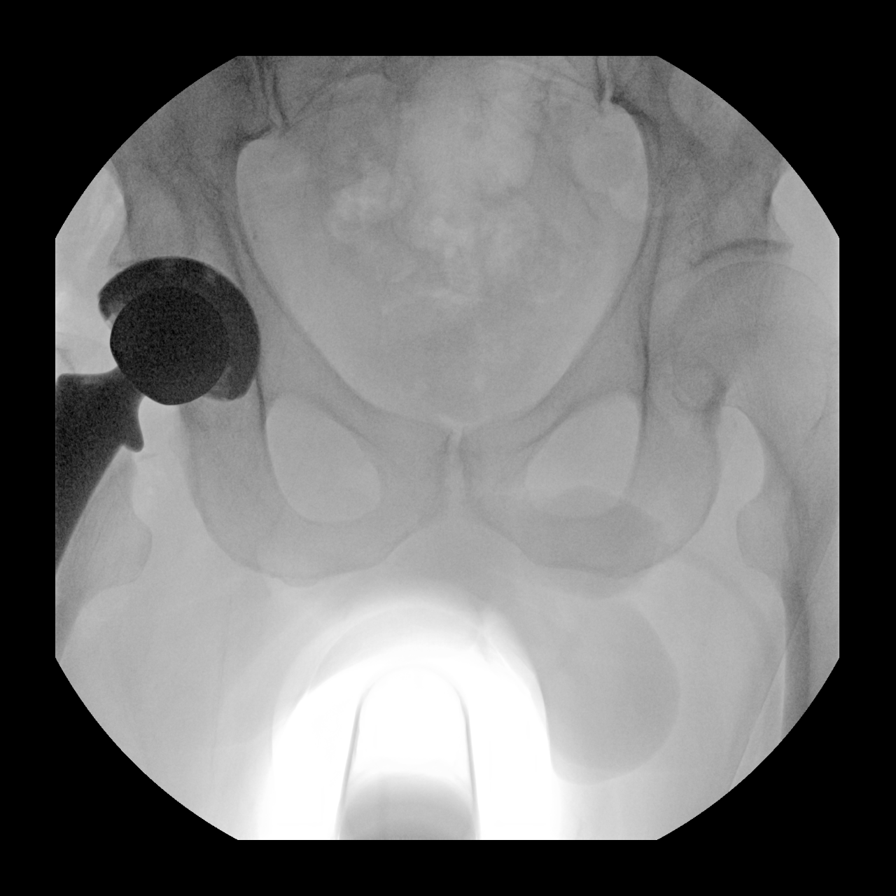
[im 6/6]
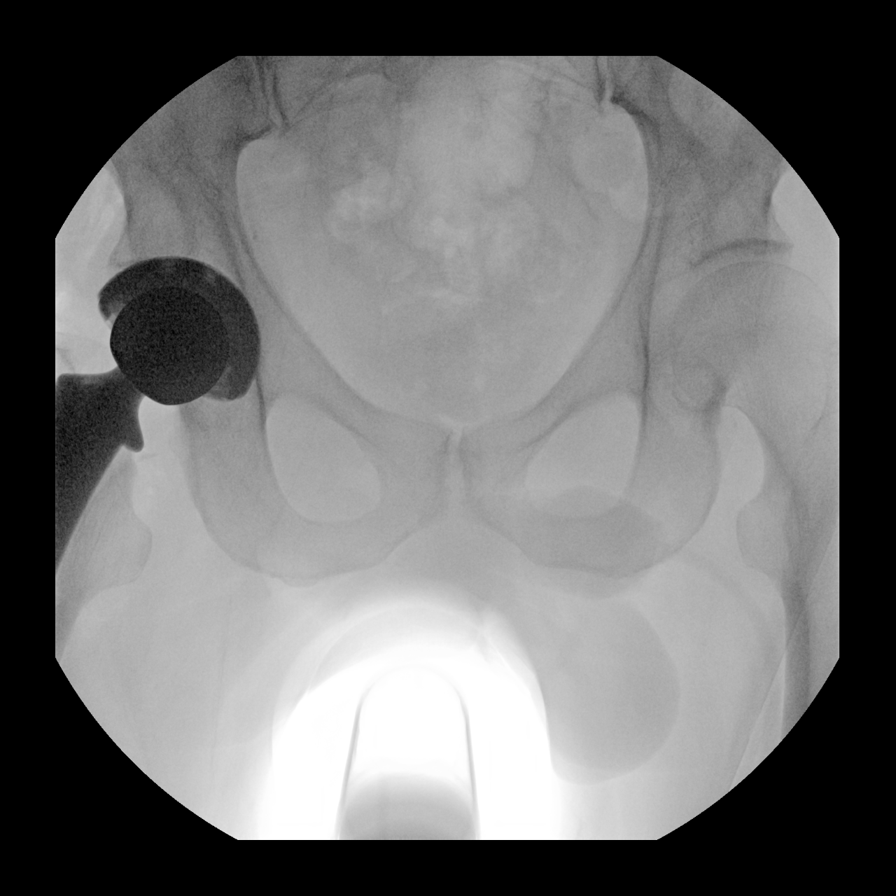

[6 of 6 positions shown; findings below may reference images not displayed]

FINDINGS: Six low resolution intraoperative spot views of the right hip. Total
fluoroscopy time was 18 seconds. The images demonstrate a right hip
replacement with normal alignment.
IMPRESSION: Intraoperative fluoroscopic assistance provided during right hip
replacement surgery

## 2018-10-18 SURGERY — ARTHROPLASTY, HIP, TOTAL, ANTERIOR APPROACH
Anesthesia: Spinal | Site: Hip | Laterality: Right

## 2018-10-18 MED ORDER — METHOCARBAMOL 500 MG PO TABS: 500.0000 mg | ORAL_TABLET | Freq: Four times a day (QID) | ORAL | Status: DC | PRN

## 2018-10-18 MED ORDER — METHOCARBAMOL 500 MG IVPB - SIMPLE MED
500.0000 mg | Freq: Four times a day (QID) | INTRAVENOUS | Status: DC | PRN
  Filled 2018-10-18: qty 50

## 2018-10-18 MED ORDER — 0.9 % SODIUM CHLORIDE (POUR BTL) OPTIME
TOPICAL | Status: DC | PRN
  Administered 2018-10-18: 1000 mL

## 2018-10-18 MED ORDER — CEFAZOLIN SODIUM-DEXTROSE 2-4 GM/100ML-% IV SOLN
INTRAVENOUS | Status: AC
  Filled 2018-10-18: qty 100

## 2018-10-18 MED ORDER — HYDROCODONE-ACETAMINOPHEN 5-325 MG PO TABS
1.0000 | ORAL_TABLET | Freq: Four times a day (QID) | ORAL | Status: DC | PRN
  Administered 2018-10-19: 1 via ORAL
  Filled 2018-10-18: qty 1

## 2018-10-18 MED ORDER — PROPOFOL 500 MG/50ML IV EMUL
INTRAVENOUS | Status: DC | PRN
  Administered 2018-10-18: 100 ug/kg/min via INTRAVENOUS

## 2018-10-18 MED ORDER — PROMETHAZINE HCL 25 MG/ML IJ SOLN: 6.2500 mg | INTRAMUSCULAR | Status: DC | PRN

## 2018-10-18 MED ORDER — BENAZEPRIL HCL 20 MG PO TABS
20.0000 mg | ORAL_TABLET | Freq: Every day | ORAL | Status: DC
  Administered 2018-10-19: 20 mg via ORAL
  Filled 2018-10-18: qty 1

## 2018-10-18 MED ORDER — LIDOCAINE HCL (CARDIAC) PF 100 MG/5ML IV SOSY
PREFILLED_SYRINGE | INTRAVENOUS | Status: DC | PRN
  Administered 2018-10-18: 50 mg via INTRAVENOUS

## 2018-10-18 MED ORDER — CEFAZOLIN SODIUM-DEXTROSE 1-4 GM/50ML-% IV SOLN
1.0000 g | Freq: Four times a day (QID) | INTRAVENOUS | Status: AC
  Administered 2018-10-19 (×2): 1 g via INTRAVENOUS
  Filled 2018-10-18 (×2): qty 50

## 2018-10-18 MED ORDER — ENOXAPARIN SODIUM 40 MG/0.4ML ~~LOC~~ SOLN: 40.0000 mg | SUBCUTANEOUS | Status: DC

## 2018-10-18 MED ORDER — TRANEXAMIC ACID-NACL 1000-0.7 MG/100ML-% IV SOLN
1000.0000 mg | INTRAVENOUS | Status: AC
  Administered 2018-10-18: 1000 mg via INTRAVENOUS

## 2018-10-18 MED ORDER — ONDANSETRON HCL 4 MG/2ML IJ SOLN
INTRAMUSCULAR | Status: AC
  Filled 2018-10-18: qty 2

## 2018-10-18 MED ORDER — HYDROMORPHONE HCL 1 MG/ML IJ SOLN: 0.5000 mg | INTRAMUSCULAR | Status: DC | PRN

## 2018-10-18 MED ORDER — BUPIVACAINE IN DEXTROSE 0.75-8.25 % IT SOLN
INTRATHECAL | Status: DC | PRN
  Administered 2018-10-18: 1.8 mL via INTRATHECAL

## 2018-10-18 MED ORDER — DOCUSATE SODIUM 100 MG PO CAPS
100.0000 mg | ORAL_CAPSULE | Freq: Two times a day (BID) | ORAL | Status: DC
  Administered 2018-10-18 – 2018-10-20 (×4): 100 mg via ORAL
  Filled 2018-10-18 (×4): qty 1

## 2018-10-18 MED ORDER — TRANEXAMIC ACID-NACL 1000-0.7 MG/100ML-% IV SOLN
INTRAVENOUS | Status: AC
  Filled 2018-10-18: qty 100

## 2018-10-18 MED ORDER — SODIUM CHLORIDE 0.9 % IV SOLN
INTRAVENOUS | Status: DC | PRN
  Administered 2018-10-18: 35 ug/min via INTRAVENOUS

## 2018-10-18 MED ORDER — KETAMINE HCL 10 MG/ML IJ SOLN
INTRAMUSCULAR | Status: AC
  Filled 2018-10-18: qty 1

## 2018-10-18 MED ORDER — KETAMINE HCL 10 MG/ML IJ SOLN
INTRAMUSCULAR | Status: DC | PRN
  Administered 2018-10-18: 30 mg via INTRAVENOUS

## 2018-10-18 MED ORDER — STERILE WATER FOR IRRIGATION IR SOLN
Status: DC | PRN
  Administered 2018-10-18: 2000 mL

## 2018-10-18 MED ORDER — TRANEXAMIC ACID 1000 MG/10ML IV SOLN: 1.5000 mg/kg/h | INTRAVENOUS | Status: DC

## 2018-10-18 MED ORDER — MORPHINE SULFATE (PF) 4 MG/ML IV SOLN
4.0000 mg | Freq: Once | INTRAVENOUS | Status: AC
  Administered 2018-10-18: 4 mg via INTRAVENOUS
  Filled 2018-10-18: qty 1

## 2018-10-18 MED ORDER — FENTANYL CITRATE (PF) 100 MCG/2ML IJ SOLN
INTRAMUSCULAR | Status: AC
  Filled 2018-10-18: qty 2

## 2018-10-18 MED ORDER — MENTHOL 3 MG MT LOZG: 1.0000 | LOZENGE | OROMUCOSAL | Status: DC | PRN

## 2018-10-18 MED ORDER — MIDAZOLAM HCL 5 MG/5ML IJ SOLN
INTRAMUSCULAR | Status: DC | PRN
  Administered 2018-10-18: 0.5 mg via INTRAVENOUS

## 2018-10-18 MED ORDER — SODIUM CHLORIDE 0.9 % IV SOLN
INTRAVENOUS | Status: DC
  Administered 2018-10-18: 21:00:00 via INTRAVENOUS

## 2018-10-18 MED ORDER — DIFLUPREDNATE 0.05 % OP EMUL
1.0000 [drp] | Freq: Three times a day (TID) | OPHTHALMIC | Status: DC
  Administered 2018-10-18 – 2018-10-20 (×5): 1 [drp] via OPHTHALMIC
  Filled 2018-10-18: qty 5

## 2018-10-18 MED ORDER — SODIUM CHLORIDE 0.9 % IR SOLN
Status: DC | PRN
  Administered 2018-10-18: 1000 mL

## 2018-10-18 MED ORDER — ALUM & MAG HYDROXIDE-SIMETH 200-200-20 MG/5ML PO SUSP: 30.0000 mL | ORAL | Status: DC | PRN

## 2018-10-18 MED ORDER — ONDANSETRON HCL 4 MG PO TABS: 4.0000 mg | ORAL_TABLET | Freq: Four times a day (QID) | ORAL | Status: DC | PRN

## 2018-10-18 MED ORDER — PHENOL 1.4 % MT LIQD: 1.0000 | OROMUCOSAL | Status: DC | PRN

## 2018-10-18 MED ORDER — ASPIRIN 81 MG PO CHEW
81.0000 mg | CHEWABLE_TABLET | Freq: Two times a day (BID) | ORAL | Status: DC
  Administered 2018-10-19 – 2018-10-20 (×3): 81 mg via ORAL
  Filled 2018-10-18 (×3): qty 1

## 2018-10-18 MED ORDER — DEXAMETHASONE SODIUM PHOSPHATE 10 MG/ML IJ SOLN
INTRAMUSCULAR | Status: AC
  Filled 2018-10-18: qty 1

## 2018-10-18 MED ORDER — LACTATED RINGERS IV SOLN
INTRAVENOUS | Status: DC | PRN
  Administered 2018-10-18 (×2): via INTRAVENOUS

## 2018-10-18 MED ORDER — ACETAMINOPHEN 325 MG PO TABS: 325.0000 mg | ORAL_TABLET | Freq: Four times a day (QID) | ORAL | Status: DC | PRN

## 2018-10-18 MED ORDER — OXYCODONE HCL 5 MG PO TABS: 10.0000 mg | ORAL_TABLET | ORAL | Status: DC | PRN

## 2018-10-18 MED ORDER — METOCLOPRAMIDE HCL 5 MG PO TABS: 5.0000 mg | ORAL_TABLET | Freq: Three times a day (TID) | ORAL | Status: DC | PRN

## 2018-10-18 MED ORDER — OXYCODONE HCL 5 MG PO TABS: 5.0000 mg | ORAL_TABLET | ORAL | Status: DC | PRN

## 2018-10-18 MED ORDER — FENTANYL CITRATE (PF) 100 MCG/2ML IJ SOLN
INTRAMUSCULAR | Status: DC | PRN
  Administered 2018-10-18 (×2): 25 ug via INTRAVENOUS
  Administered 2018-10-18: 50 ug via INTRAVENOUS

## 2018-10-18 MED ORDER — ONDANSETRON HCL 4 MG/2ML IJ SOLN: 4.0000 mg | Freq: Four times a day (QID) | INTRAMUSCULAR | Status: DC | PRN

## 2018-10-18 MED ORDER — CEFAZOLIN SODIUM-DEXTROSE 2-4 GM/100ML-% IV SOLN
2.0000 g | Freq: Once | INTRAVENOUS | Status: AC
  Administered 2018-10-18: 2 g via INTRAVENOUS

## 2018-10-18 MED ORDER — ACETAMINOPHEN 500 MG PO TABS
1000.0000 mg | ORAL_TABLET | Freq: Once | ORAL | Status: AC
  Administered 2018-10-18: 1000 mg via ORAL
  Filled 2018-10-18: qty 2

## 2018-10-18 MED ORDER — LACTATED RINGERS IV SOLN
INTRAVENOUS | Status: DC
  Administered 2018-10-18: 17:00:00 via INTRAVENOUS

## 2018-10-18 MED ORDER — DIPHENHYDRAMINE HCL 12.5 MG/5ML PO ELIX: 12.5000 mg | ORAL_SOLUTION | ORAL | Status: DC | PRN

## 2018-10-18 MED ORDER — MIDAZOLAM HCL 2 MG/2ML IJ SOLN
INTRAMUSCULAR | Status: AC
  Filled 2018-10-18: qty 2

## 2018-10-18 MED ORDER — PANTOPRAZOLE SODIUM 40 MG PO TBEC
40.0000 mg | DELAYED_RELEASE_TABLET | Freq: Every day | ORAL | Status: DC
  Administered 2018-10-18 – 2018-10-20 (×3): 40 mg via ORAL
  Filled 2018-10-18 (×3): qty 1

## 2018-10-18 MED ORDER — FENTANYL CITRATE (PF) 100 MCG/2ML IJ SOLN: 25.0000 ug | INTRAMUSCULAR | Status: DC | PRN

## 2018-10-18 MED ORDER — PROPOFOL 10 MG/ML IV BOLUS
INTRAVENOUS | Status: AC
  Filled 2018-10-18: qty 60

## 2018-10-18 MED ORDER — MORPHINE SULFATE (PF) 2 MG/ML IV SOLN: 0.5000 mg | INTRAVENOUS | Status: DC | PRN

## 2018-10-18 MED ORDER — METOCLOPRAMIDE HCL 5 MG/ML IJ SOLN: 5.0000 mg | Freq: Three times a day (TID) | INTRAMUSCULAR | Status: DC | PRN

## 2018-10-18 SURGICAL SUPPLY — 41 items
ACETAB CUP W/GRIPTION 54 (Plate) ×2 IMPLANT
APL SKNCLS STERI-STRIP NONHPOA (GAUZE/BANDAGES/DRESSINGS)
BAG SPEC THK2 15X12 ZIP CLS (MISCELLANEOUS)
BAG ZIPLOCK 12X15 (MISCELLANEOUS) IMPLANT
BENZOIN TINCTURE PRP APPL 2/3 (GAUZE/BANDAGES/DRESSINGS) IMPLANT
BLADE SAW SGTL 83.5X18.5 (BLADE) ×1 IMPLANT
BLADE SURG SZ10 CARB STEEL (BLADE) ×4 IMPLANT
COVER PERINEAL POST (MISCELLANEOUS) ×2 IMPLANT
COVER SURGICAL LIGHT HANDLE (MISCELLANEOUS) ×2 IMPLANT
COVER WAND RF STERILE (DRAPES) IMPLANT
CUP ACETAB W/GRIPTION 54 (Plate) IMPLANT
DRAPE STERI IOBAN 125X83 (DRAPES) ×2 IMPLANT
DRAPE U-SHAPE 47X51 STRL (DRAPES) ×4 IMPLANT
DRSG AQUACEL AG ADV 3.5X10 (GAUZE/BANDAGES/DRESSINGS) ×2 IMPLANT
DURAPREP 26ML APPLICATOR (WOUND CARE) ×2 IMPLANT
ELECT REM PT RETURN 15FT ADLT (MISCELLANEOUS) ×2 IMPLANT
GAUZE XEROFORM 1X8 LF (GAUZE/BANDAGES/DRESSINGS) ×1 IMPLANT
GLOVE BIO SURGEON STRL SZ7.5 (GLOVE) ×2 IMPLANT
GLOVE BIOGEL PI IND STRL 8 (GLOVE) ×2 IMPLANT
GLOVE BIOGEL PI INDICATOR 8 (GLOVE) ×2
GLOVE ECLIPSE 8.0 STRL XLNG CF (GLOVE) ×2 IMPLANT
GOWN STRL REUS W/TWL XL LVL3 (GOWN DISPOSABLE) ×4 IMPLANT
HANDPIECE INTERPULSE COAX TIP (DISPOSABLE) ×2
HEAD M SROM 36MM PLUS 1.5 (Hips) IMPLANT
HOLDER FOLEY CATH W/STRAP (MISCELLANEOUS) ×2 IMPLANT
KIT TURNOVER KIT A (KITS) IMPLANT
LINER NEUTRAL 36ID 54OD (Liner) ×1 IMPLANT
PACK ANTERIOR HIP CUSTOM (KITS) ×2 IMPLANT
SET HNDPC FAN SPRY TIP SCT (DISPOSABLE) ×1 IMPLANT
SROM M HEAD 36MM PLUS 1.5 (Hips) ×2 IMPLANT
STAPLER VISISTAT 35W (STAPLE) IMPLANT
STEM FEMORAL SZ 5MM STD ACTIS (Stem) ×1 IMPLANT
STRIP CLOSURE SKIN 1/2X4 (GAUZE/BANDAGES/DRESSINGS) IMPLANT
SUT ETHIBOND NAB CT1 #1 30IN (SUTURE) ×2 IMPLANT
SUT MNCRL AB 4-0 PS2 18 (SUTURE) IMPLANT
SUT VIC AB 0 CT1 36 (SUTURE) ×2 IMPLANT
SUT VIC AB 1 CT1 36 (SUTURE) ×2 IMPLANT
SUT VIC AB 2-0 CT1 27 (SUTURE) ×4
SUT VIC AB 2-0 CT1 TAPERPNT 27 (SUTURE) ×2 IMPLANT
TRAY FOLEY MTR SLVR 16FR STAT (SET/KITS/TRAYS/PACK) ×2 IMPLANT
YANKAUER SUCT BULB TIP 10FT TU (MISCELLANEOUS) ×2 IMPLANT

## 2018-10-18 NOTE — Brief Op Note (Signed)
10/18/2018  6:31 PM  PATIENT:  Jerome Kelley  78 y.o. male  PRE-OPERATIVE DIAGNOSIS:  right hip fracture  POST-OPERATIVE DIAGNOSIS:  right hip fracture  PROCEDURE:  Procedure(s): TOTAL HIP ARTHROPLASTY ANTERIOR APPROACH (Right)  SURGEON:  Surgeon(s) and Role:    Mcarthur Rossetti, MD - Primary  PHYSICIAN ASSISTANT: Benita Stabile, PA-C  ANESTHESIA:   spinal  EBL:  100 mL   COUNTS:  YES  TOURNIQUET:  * No tourniquets in log *  DICTATION: .Other Dictation: Dictation Number [id number]  PLAN OF CARE: Admit to inpatient   PATIENT DISPOSITION:  PACU - hemodynamically stable.   Delay start of Pharmacological VTE agent (>24hrs) due to surgical blood loss or risk of bleeding: no

## 2018-10-18 NOTE — ED Notes (Signed)
Per 3W RN, patient scheduled for surgery at 1700. Patient to be transferred to OR from ED.

## 2018-10-18 NOTE — Op Note (Signed)
NAME: Jerome Kelley, [place] OM:[id number] ACCOUNT [id number] DATE OF BIRTH:[birth year] FACILITY: [facility] LOCATION: [facility] PHYSICIAN:Indio Santilli Kerry Fort, MD  OPERATIVE REPORT  DATE OF PROCEDURE:  10/18/2018  PREOPERATIVE DIAGNOSIS:  Right hip with displaced femoral neck fracture.  POSTOPERATIVE DIAGNOSIS:  Right hip with displaced femoral neck fracture.  PROCEDURE:  Right total hip arthroplasty through direct anterior approach.  IMPLANTS:  DePuy Sector Gription acetabular component size 54, size 36+0 neutral polyethylene liner, size 5 ACTIS DePuy femoral component with standard offset, size 36+1.5 metal hip ball.  SURGEON:  Lind Guest. Ninfa Linden, MD  ASSISTANT:  Erskine Emery, PA-C  ANESTHESIA:  Spinal.  ANTIBIOTICS:  Two grams IV Ancef.  ESTIMATED BLOOD LOSS:  150 mL.  COMPLICATIONS:  None.  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  He had the inability to  ambulate and severe right hip pain.  He was brought to the [facility] Emergency Room and found to have a displaced right hip femoral neck fracture.  It was only minimally displaced; however, it was displaced enough, and the fact that he is such a heavy  smoker, I thought the risk of nonunion and osteonecrosis from cannulated screw placement would be high.  I then recommended total hip arthroplasty, direct anterior approach.  I described in detail the operative and nonoperative treatment options.  He did  elect to have a total hip arthroplasty as well.  I had a long and thorough discussion about the risk of acute blood loss anemia, nerve or vessel injury, fracture, infection, dislocation, DVT and implant failure.  I talked about the goals of decreased  pain, improved mobility and overall improved quality of life.  DESCRIPTION OF  PROCEDURE:  After informed consent was obtained and appropriate right hip was marked, he was brought to the operating room, turned on his side on a stretcher and spinal anesthesia was then obtained.  A Foley catheter was placed.  Both feet  had traction boots applied to them.  Next, he was placed supine on the Hana fracture table, the perineal post in place and both legs in in-line skeletal traction device and no traction applied.  His right operative hip was prepped and draped with  ChloraPrep and sterile drapes.  A time-out was called, and he was identified as correct patient, correct right hip.  I then made an incision just inferior and posterior to the anterior superior iliac spine and carried this obliquely down the leg.  We  dissected down the tensor fascia lata muscle.  Tensor fascia was then divided longitudinally to proceed with direct anterior approach to the hip.  We identified and cauterized circumflex vessels and identified the hip capsule, opened up the hip capsule,  finding a large hematoma and hemarthrosis consistent with a femoral neck fracture, and you could see his femoral neck fracture was quite evident and displaced.  I placed Cobra retractors around the medial and lateral remnants of the femoral neck and then  made our freshening femoral neck cut just distal to that fracture but proximal to the lesser trochanter.  I placed a corkscrew guide in the femoral head and removed the femoral head in its entirety and then passed off to the back table.  I placed a bent  Hohmann over the medial acetabular rim and then removed remnants of the acetabular  labrum and other debris.  We then began reaming from a size 44 reamer in stepwise increments up to a size 53 with all reamers under direct visualization, the last reamer  under direct fluoroscopy so I could obtain our depth of reaming, our inclination and anteversion.  I then placed the real DePuy Sector Gription acetabular component size 54 and a  36+0 neutral polyethylene liner for that size acetabular component.   Attention was then turned to the femur.  With the leg externally rotated to 120 degrees, extended and adducted, we placed a Mueller retractor medially and Hohmann retractor behind the greater trochanter, released the lateral joint capsule and used a  box-cutting osteotome to enter the femoral canal and a rongeur to lateralize.  I then began broaching using the ACTIS broaching system from a size 0 up to a size 5.  The size 5 had a nice fill to the canal, so we trialed a standard offset femoral neck  and 36+1.5 hip ball.  We reduced this into the acetabulum.  We were pleased with the leg length, offset, range of motion and stability.  We then dislocated the hip and removed the trial components.  I placed the real ACTIS femoral component size 5 with  standard offset and the real 36+1.5 metal hip ball and again reduced this in the acetabulum.  We appreciated the stability on exam and assessed under direct fluoroscopy.  We then irrigated the soft tissue with normal saline solution using pulsatile  lavage.  We closed the joint capsule with interrupted #1 Ethibond suture, followed by running 0 Vicryl to close the tensor fascia, 0 Vicryl to close the deep tissue, 2-0 Vicryl to close the subcutaneous tissue, and interrupted staples on the skin.   Xeroform and Aquacel dressing were applied.  He was taken off the Hana table and taken to the recovery room in stable condition.  All final counts were correct.  There were no complications noted.  Of note, Benita Stabile, PA-C, assisted during the entire  case, and his assistance was crucial for facilitating all aspects of this case.  LN/NUANCE  D:10/18/2018 T:10/18/2018 JOB:[id number]

## 2018-10-18 NOTE — ED Notes (Signed)
Bed: WA17 Expected date:  Expected time:  Means of arrival:  Comments: EMS-fall-hip deformity

## 2018-10-18 NOTE — ED Notes (Signed)
XR at bedside

## 2018-10-18 NOTE — Anesthesia Procedure Notes (Signed)
Spinal  Patient location during procedure: OR Staffing Anesthesiologist: Duane Boston, MD Performed: anesthesiologist  Preanesthetic Checklist Completed: patient identified, surgical consent, pre-op evaluation, timeout performed, IV checked, risks and benefits discussed and monitors and equipment checked Spinal Block Patient position: right lateral decubitus Prep: DuraPrep Patient monitoring: cardiac monitor, continuous pulse ox and blood pressure Approach: midline Location: L2-3 Injection technique: single-shot Needle Needle type: Pencan  Needle gauge: 24 G Needle length: 9 cm Additional Notes Functioning IV was confirmed and monitors were applied. Sterile prep and drape, including hand hygiene and sterile gloves were used. The patient was positioned and the spine was prepped. The skin was anesthetized with lidocaine.  Free flow of clear CSF was obtained prior to injecting local anesthetic into the CSF.  The spinal needle aspirated freely following injection.  The needle was carefully withdrawn.  The patient tolerated the procedure well.

## 2018-10-18 NOTE — Anesthesia Preprocedure Evaluation (Addendum)
Anesthesia Evaluation  Patient identified by MRN, date of birth, ID band Patient awake    Reviewed: Allergy & Precautions, NPO status , Patient's Chart, lab work & pertinent test results  History of Anesthesia Complications Negative for: history of anesthetic complications  Airway Mallampati: I  TM Distance: >3 FB Neck ROM: Full    Dental  (+) Edentulous Upper, Edentulous Lower   Pulmonary COPD, Current Smoker,    Pulmonary exam normal        Cardiovascular hypertension, Pt. on medications negative cardio ROS Normal cardiovascular exam     Neuro/Psych negative neurological ROS     GI/Hepatic negative GI ROS, Neg liver ROS,   Endo/Other  negative endocrine ROS  Renal/GU negative Renal ROS     Musculoskeletal negative musculoskeletal ROS (+)   Abdominal   Peds  Hematology negative hematology ROS (+)   Anesthesia Other Findings Day of surgery medications reviewed with the patient.  Reproductive/Obstetrics                            Anesthesia Physical Anesthesia Plan  ASA: II  Anesthesia Plan: Spinal   Post-op Pain Management:    Induction:   PONV Risk Score and Plan: 2 and Ondansetron and Propofol infusion  Airway Management Planned: Natural Airway  Additional Equipment:   Intra-op Plan:   Post-operative Plan:   Informed Consent: I have reviewed the patients History and Physical, chart, labs and discussed the procedure including the risks, benefits and alternatives for the proposed anesthesia with the patient or authorized representative who has indicated his/her understanding and acceptance.       Plan Discussed with: CRNA, Anesthesiologist and Surgeon  Anesthesia Plan Comments:        Anesthesia Quick Evaluation

## 2018-10-18 NOTE — ED Triage Notes (Signed)
[facility] reports the following. Pt transported from vets office, dog wrapped leash around legs and patient fell, landed on right hip. Denied head injury and LOC. Right hip deformity. Hx HTN. Hypertensive with EMS and declined pain med. BP 168/78, HR 78, RR 20, 98% on room air.

## 2018-10-18 NOTE — ED Notes (Addendum)
ED TO INPATIENT HANDOFF REPORT  ED Nurse Name and Phone #:   S Name/Age/Gender Jerome Kelley 78 y.o. male Room/Bed: WA17/WA17  Code Status   Code Status: Full Code  Home/SNF/Other Home Patient oriented to: self, place, time and situation Is this baseline? Yes   Triage Complete: Triage complete  Chief Complaint fall;hip pain  Triage Note [facility] reports the following. Pt transported from vets office, dog wrapped leash around legs and patient fell, landed on right hip. Denied head injury and LOC. Right hip deformity. Hx HTN. Hypertensive with EMS and declined pain med. BP 168/78, HR 78, RR 20, 98% on room air.   Allergies No Known Allergies  Level of Care/Admitting Diagnosis ED Disposition    ED Disposition Condition Comment   Admit  Hospital Area: [place] [[id number]]  Level of Care: Med-Surg [16]  Diagnosis: Hip fracture [facility]) [[id number]]  Admitting Physician: Monna Fam  Attending Physician: Benny Lennert, AVA [email or web address]  Estimated length of stay: 3 - 4 days  Certification:: I certify this patient will need inpatient services for at least 2 midnights  PT Class (Do Not Modify): Inpatient [101]  PT Acc Code (Do Not Modify): Private [1]       B Medical/Surgery History Past Medical History:  Diagnosis Date  . Colon cancer ([place]) 09/18/2011  . Hypertension   . Lung cancer, upper lobe ([place]) 09/18/2011   Past Surgical History:  Procedure Laterality Date  . COLON SURGERY  2010     A IV Location/Drains/Wounds Patient Lines/Drains/Airways Status   Active Line/Drains/Airways    Name:   Placement date:   Placement time:   Site:   Days:   Peripheral IV 10/18/18 Right Antecubital   10/18/18    1330    Antecubital   less than 1          Intake/Output Last 24 hours No intake or output data in the 24 hours ending 10/18/18 1549  Labs/Imaging Results for orders placed or performed during the hospital encounter of 10/18/18 (from the past 48 hour(s))  CBC  with Differential     Status: Abnormal   Collection Time: 10/18/18  1:43 PM  Result Value Ref Range   WBC 6.0 4.0 - 10.5 K/uL   RBC 4.40 4.22 - 5.81 MIL/uL   Hemoglobin 15.4 13.0 - 17.0 g/dL   HCT 46.1 39.0 - 52.0 %   MCV 104.8 (H) 80.0 - 100.0 fL   MCH 35.0 (H) 26.0 - 34.0 pg   MCHC 33.4 30.0 - 36.0 g/dL   RDW 13.9 11.5 - 15.5 %   Platelets 120 (L) 150 - 400 K/uL    Comment: Immature Platelet Fraction may be clinically indicated, consider ordering this additional test [id number]    nRBC 0.0 0.0 - 0.2 %   Neutrophils Relative % 67 %   Neutro Abs 4.0 1.7 - 7.7 K/uL   Lymphocytes Relative 20 %   Lymphs Abs 1.2 0.7 - 4.0 K/uL   Monocytes Relative 10 %   Monocytes Absolute 0.6 0.1 - 1.0 K/uL   Eosinophils Relative 2 %   Eosinophils Absolute 0.1 0.0 - 0.5 K/uL   Basophils Relative 0 %   Basophils Absolute 0.0 0.0 - 0.1 K/uL   Immature Granulocytes 1 %   Abs Immature Granulocytes 0.05 0.00 - 0.07 K/uL    Comment: Performed at [facility], [place] [address], [place], [place] [address]  Basic metabolic panel     Status: Abnormal  Collection Time: 10/18/18  1:43 PM  Result Value Ref Range   Sodium 140 135 - 145 mmol/L   Potassium 3.9 3.5 - 5.1 mmol/L   Chloride 106 98 - 111 mmol/L   CO2 27 22 - 32 mmol/L   Glucose, Bld 98 70 - 99 mg/dL   BUN 13 8 - 23 mg/dL   Creatinine, Ser 0.77 0.61 - 1.24 mg/dL   Calcium 8.8 (L) 8.9 - 10.3 mg/dL   GFR calc non Af Amer >60 >60 mL/min   GFR calc Af Amer >60 >60 mL/min   Anion gap 7 5 - 15    Comment: Performed at [facility], [place] [address], [place], [place] [address]   Dg Hip Unilat W Or Wo Pelvis 2-3 Views Right  Result Date: 10/18/2018 CLINICAL DATA:  Right hip pain secondary to a fall today. EXAM: DG HIP (WITH OR WITHOUT PELVIS) 2-3V RIGHT COMPARISON:  None. FINDINGS: There is a slightly displaced fracture through the right femoral neck. No significant angulation. No other abnormality. IMPRESSION: Right  femoral neck fracture. Electronically Signed   By: Lorriane Shire M.D.   On: 10/18/2018 14:35    Pending Labs Unresulted Labs (From admission, onward)    Start     Ordered   10/25/18 0500  Creatinine, serum  (enoxaparin (LOVENOX)    CrCl >/= 30 ml/min)  Weekly,   R    Comments:  while on enoxaparin therapy    10/18/18 1532   10/19/18 0500  CBC  Tomorrow morning,   R     10/18/18 1532   10/19/18 0626  Basic metabolic panel  Tomorrow morning,   R     10/18/18 1532   10/18/18 1529  CBC  (enoxaparin (LOVENOX)    CrCl >/= 30 ml/min)  Once,   R    Comments:  Baseline for enoxaparin therapy IF NOT ALREADY DRAWN.  Notify MD if PLT < 100 K.    10/18/18 1532   10/18/18 1529  Creatinine, serum  (enoxaparin (LOVENOX)    CrCl >/= 30 ml/min)  Once,   R    Comments:  Baseline for enoxaparin therapy IF NOT ALREADY DRAWN.    10/18/18 1532          Vitals/Pain Today's Vitals   10/18/18 1424 10/18/18 1430 10/18/18 1500 10/18/18 1530  BP: (!) 183/76 (!) 171/80 (!) 190/76 104/88  Pulse: 62 61 67 68  Resp: 17 17 19 10   Temp:      SpO2: 99% 97% 97% 95%  Weight:      Height:      PainSc:        Isolation Precautions No active isolations  Medications Medications  HYDROcodone-acetaminophen (NORCO/VICODIN) 5-325 MG per tablet 1-2 tablet (has no administration in time range)  morphine 2 MG/ML injection 0.5 mg (has no administration in time range)  enoxaparin (LOVENOX) injection 40 mg (has no administration in time range)  morphine 4 MG/ML injection 4 mg (4 mg Intravenous Given 10/18/18 1339)    Mobility walks High fall risk   Focused Assessments    R Recommendations: See Admitting Provider Note  Report given to:   Additional Notes:

## 2018-10-18 NOTE — H&P (Signed)
Jerome Kelley is an 78 y.o. male.   Chief Complaint: Pain in right hip after fall. HPI: The patient is a 78 yr old man who was leaving the veterinarian's office with his dog on a leash. His dog walked across Jerome Kelley path on his way to investigate another dog and then came back again. The patient's legs became entangled in the dog's leash and he fell on his right him. He had immediate pain in the right hip. X-ray of the hip in the ED has revealed a slightly displaced fracture through the right femoral neck without significant angulation.   The patient denies any fever, chills, chest pain, shortness of breath, cough, nausea and vomiting, or diarrhea prior to this incident today. He carries a past medical history significant for Colon cancer and lung cancer for which her underwent treatment in 2010. He has had no difficulty since then.  Upon arrival in the ED the patient had a temperature of 97.6, Respiratory rate of 16, heart rate of 61, and blood pressure of 184/82.   The patient's sodium was 140. Potassium 3.9. CO2 was 27. BUN was 13, and creatinine was 0.77.  WBC was 6.0. Hemoglobin was 15.4. Platelets were 120.   EKG demonstrated normal sinus rhythm with no significant changes from previous tracing. CXR demonstrated no acute cardiopulmonary disease.  Orthopedic surgery was consulted from the ED and plans to take the patient to surgery later today.  The hospitalist service was consulted to admit the patient for further evaluation and treatment.  Past Medical History:  Diagnosis Date  . Colon cancer ([place]) 09/18/2011  . Hypertension   . Lung cancer, upper lobe ([place]) 09/18/2011    Past Surgical History:  Procedure Laterality Date  . COLON SURGERY  2010    History reviewed. No pertinent family history. Social History:  reports that he has been smoking cigarettes. He started smoking about 62 years ago. He has a 58.00 pack-year smoking history. He has never used smokeless tobacco. He reports  that he does not drink alcohol or use drugs. (Not in a hospital admission)   Allergies: No Known Allergies  Pertinent items are noted in HPI.  General appearance: alert, cooperative and no distress Head: Normocephalic, without obvious abnormality, atraumatic Eyes: conjunctivae/corneas clear. PERRL, EOM's intact. Fundi benign. Throat: lips, mucosa, and tongue normal; teeth and gums normal Neck: no adenopathy, no carotid bruit, no JVD, supple, symmetrical, trachea midline and thyroid not enlarged, symmetric, no tenderness/mass/nodules Resp: No increased work of breathing. No wheezes, rales, or rhonchi. No tactile fremitus. Chest wall: no tenderness Cardio: regular rate and rhythm, S1, S2 normal, no murmur, click, rub or gallop GI: soft, non-tender; bowel sounds normal; no masses,  no organomegaly Extremities: extremities normal, atraumatic, no cyanosis or edema Pulses: 2+ and symmetric Skin: Skin color, texture, turgor normal. No rashes or lesions Lymph nodes: Cervical, supraclavicular, and axillary nodes normal. Neurologic: Grossly normal  Results for orders placed or performed during the hospital encounter of 10/18/18 (from the past 48 hour(s))  CBC with Differential     Status: Abnormal   Collection Time: 10/18/18  1:43 PM  Result Value Ref Range   WBC 6.0 4.0 - 10.5 K/uL   RBC 4.40 4.22 - 5.81 MIL/uL   Hemoglobin 15.4 13.0 - 17.0 g/dL   HCT 46.1 39.0 - 52.0 %   MCV 104.8 (H) 80.0 - 100.0 fL   MCH 35.0 (H) 26.0 - 34.0 pg   MCHC 33.4 30.0 - 36.0 g/dL  RDW 13.9 11.5 - 15.5 %   Platelets 120 (L) 150 - 400 K/uL    Comment: Immature Platelet Fraction may be clinically indicated, consider ordering this additional test [id number]    nRBC 0.0 0.0 - 0.2 %   Neutrophils Relative % 67 %   Neutro Abs 4.0 1.7 - 7.7 K/uL   Lymphocytes Relative 20 %   Lymphs Abs 1.2 0.7 - 4.0 K/uL   Monocytes Relative 10 %   Monocytes Absolute 0.6 0.1 - 1.0 K/uL   Eosinophils Relative 2 %    Eosinophils Absolute 0.1 0.0 - 0.5 K/uL   Basophils Relative 0 %   Basophils Absolute 0.0 0.0 - 0.1 K/uL   Immature Granulocytes 1 %   Abs Immature Granulocytes 0.05 0.00 - 0.07 K/uL    Comment: Performed at [facility], [place] [address] [place], [place] [address]  Basic metabolic panel     Status: Abnormal   Collection Time: 10/18/18  1:43 PM  Result Value Ref Range   Sodium 140 135 - 145 mmol/L   Potassium 3.9 3.5 - 5.1 mmol/L   Chloride 106 98 - 111 mmol/L   CO2 27 22 - 32 mmol/L   Glucose, Bld 98 70 - 99 mg/dL   BUN 13 8 - 23 mg/dL   Creatinine, Ser 0.77 0.61 - 1.24 mg/dL   Calcium 8.8 (L) 8.9 - 10.3 mg/dL   GFR calc non Af Amer >60 >60 mL/min   GFR calc Af Amer >60 >60 mL/min   Anion gap 7 5 - 15    Comment: Performed at [facility], [place] [address] [place], [place] [address]   [email or web address] @  Blood pressure (!) 177/90, pulse 73, temperature 97.6 F (36.4 C), resp. rate 15, height 5\' 8"  (1.727 m), weight 80.7 kg, SpO2 96 %.    Assessment/Plan Problem  Hip Fracture (Hcc)  Essential Hypertension  Colon cancer (HCC)  Lung cancer, upper lobe ([place])   The patient carries an increased risk of morbidity and mortality in the perioperative period secondary to his advanced age. That being said his risk of cardiac complications are low. The risk of perioperative morbidity and mortality associated with the surgery to repair his fracture is far lower than that the risk to his well-being if his hip is not repaired. The benefit of the surgery to the patient's future mobility and well being far exceeds any risk of the procedure. The patient is cleared for surgery.  He will be admitted to a med/surg bed. DVT prophylaxis, pain control, wound care, activity restrictions and follow up are as per orthopedic surgery. He will be continued on his home antihypertensive as possible.  I have seen and examined this patient myself. I have spent 70 minutes in his  evaluation and admission.      Jerome Kelley 10/18/2018, 4:34 PM

## 2018-10-18 NOTE — ED Notes (Signed)
ED Provider at bedside. 

## 2018-10-18 NOTE — Transfer of Care (Signed)
Immediate Anesthesia Transfer of Care Note  Patient: Jerome Kelley  Procedure(s) Performed: TOTAL HIP ARTHROPLASTY ANTERIOR APPROACH (Right Hip)  Patient Location: PACU  Anesthesia Type:Spinal  Level of Consciousness: awake, alert , oriented and patient cooperative  Airway & Oxygen Therapy: Patient Spontanous Breathing and Patient connected to face mask oxygen  Post-op Assessment: Report given to RN and Post -op Vital signs reviewed and stable  Post vital signs: Reviewed  stable  Last Vitals:  Vitals Value Taken Time  BP 150/65 10/18/2018  6:55 PM  Temp    Pulse 79 10/18/2018  6:59 PM  Resp 23 10/18/2018  6:59 PM  SpO2 100 % 10/18/2018  6:59 PM  Vitals shown include unvalidated device data.  Last Pain:  Vitals:   10/18/18 1700  TempSrc: Oral  PainSc: 0-No pain         Complications: No apparent anesthesia complications

## 2018-10-18 NOTE — Anesthesia Procedure Notes (Signed)
Procedure Name: MAC Date/Time: 10/18/2018 5:17 PM Performed by: Lissa Morales, CRNA Pre-anesthesia Checklist: Patient identified, Emergency Drugs available, Suction available, Patient being monitored and Timeout performed Patient Re-evaluated:Patient Re-evaluated prior to induction Oxygen Delivery Method: Simple face mask Placement Confirmation: positive ETCO2

## 2018-10-18 NOTE — ED Notes (Signed)
Patient transported to X-ray 

## 2018-10-18 NOTE — ED Notes (Signed)
Report given to [facility] in Short Stay.

## 2018-10-18 NOTE — Consult Note (Signed)
Reason for Consult:  Right hip fracture Referring Physician: Dr. Cassandria Anger MICHALL NOFFKE is an 78 y.o. male.  HPI: The patient is a 78 year old gentleman who sustained an accidental mechanical fall onto his right hip when he was at the vets office today and got caught up with his dog's leash.  He had inability to ambulate and significant right hip pain after that.  He is brought to the vascular emergency room and x-rays showed a displaced right hip femoral neck fracture.  Orthopedic surgery is consulted for evaluation treatment of the right hip fracture.  He has been admitted to medicine service for medical management as well.  Right now his medical issues are all stable.  He has not had any previous right hip pain and he is accumulating ambulator and very active.  Past Medical History:  Diagnosis Date  . Colon cancer ([place]) 09/18/2011  . Hypertension   . Lung cancer, upper lobe ([place]) 09/18/2011    Past Surgical History:  Procedure Laterality Date  . COLON SURGERY  2010    History reviewed. No pertinent family history.  Social History:  reports that he has been smoking cigarettes. He started smoking about 62 years ago. He has a 58.00 pack-year smoking history. He has never used smokeless tobacco. He reports that he does not drink alcohol or use drugs.  Allergies: No Known Allergies  Medications: I have reviewed the patient's current medications.  Results for orders placed or performed during the hospital encounter of 10/18/18 (from the past 48 hour(s))  CBC with Differential     Status: Abnormal   Collection Time: 10/18/18  1:43 PM  Result Value Ref Range   WBC 6.0 4.0 - 10.5 K/uL   RBC 4.40 4.22 - 5.81 MIL/uL   Hemoglobin 15.4 13.0 - 17.0 g/dL   HCT 46.1 39.0 - 52.0 %   MCV 104.8 (H) 80.0 - 100.0 fL   MCH 35.0 (H) 26.0 - 34.0 pg   MCHC 33.4 30.0 - 36.0 g/dL   RDW 13.9 11.5 - 15.5 %   Platelets 120 (L) 150 - 400 K/uL    Comment: Immature Platelet Fraction may be clinically indicated,  consider ordering this additional test [id number]    nRBC 0.0 0.0 - 0.2 %   Neutrophils Relative % 67 %   Neutro Abs 4.0 1.7 - 7.7 K/uL   Lymphocytes Relative 20 %   Lymphs Abs 1.2 0.7 - 4.0 K/uL   Monocytes Relative 10 %   Monocytes Absolute 0.6 0.1 - 1.0 K/uL   Eosinophils Relative 2 %   Eosinophils Absolute 0.1 0.0 - 0.5 K/uL   Basophils Relative 0 %   Basophils Absolute 0.0 0.0 - 0.1 K/uL   Immature Granulocytes 1 %   Abs Immature Granulocytes 0.05 0.00 - 0.07 K/uL    Comment: Performed at [facility], [place] [address], [place], [place] [address]  Basic metabolic panel     Status: Abnormal   Collection Time: 10/18/18  1:43 PM  Result Value Ref Range   Sodium 140 135 - 145 mmol/L   Potassium 3.9 3.5 - 5.1 mmol/L   Chloride 106 98 - 111 mmol/L   CO2 27 22 - 32 mmol/L   Glucose, Bld 98 70 - 99 mg/dL   BUN 13 8 - 23 mg/dL   Creatinine, Ser 0.77 0.61 - 1.24 mg/dL   Calcium 8.8 (L) 8.9 - 10.3 mg/dL   GFR calc non Af Amer >60 >60 mL/min   GFR  calc Af Amer >60 >60 mL/min   Anion gap 7 5 - 15    Comment: Performed at [facility], [place] [address], [place], [place] [address]    Dg Chest Port 1 View  Result Date: 10/18/2018 CLINICAL DATA:  Status post fall. EXAM: PORTABLE CHEST 1 VIEW COMPARISON:  None. FINDINGS: The heart size and mediastinal contours are stable. Surgical clips project over the right hilum. Scarring of lateral left lung base is unchanged. There is no focal pneumonia or pleural effusion. No pulmonary edema is noted. The visualized skeletal structures are unremarkable. IMPRESSION: No active cardiopulmonary disease. Electronically Signed   By: Abelardo Diesel M.D.   On: 10/18/2018 15:48   Dg Hip Unilat W Or Wo Pelvis 2-3 Views Right  Result Date: 10/18/2018 CLINICAL DATA:  Right hip pain secondary to a fall today. EXAM: DG HIP (WITH OR WITHOUT PELVIS) 2-3V RIGHT COMPARISON:  None. FINDINGS: There is a slightly displaced fracture through  the right femoral neck. No significant angulation. No other abnormality. IMPRESSION: Right femoral neck fracture. Electronically Signed   By: Lorriane Shire M.D.   On: 10/18/2018 14:35    Review of Systems  Musculoskeletal: Positive for joint pain.  All other systems reviewed and are negative.  Blood pressure (!) 177/90, pulse 73, temperature 97.6 F (36.4 C), resp. rate 15, height 5\' 8"  (1.727 m), weight 80.7 kg, SpO2 96 %. Physical Exam  Constitutional: He is oriented to person, place, and time. He appears well-developed and well-nourished.  HENT:  Head: Normocephalic and atraumatic.  Eyes: Pupils are equal, round, and reactive to light.  Neck: Normal range of motion.  Cardiovascular: Normal rate.  Respiratory: Effort normal.  GI: Soft.  Musculoskeletal:     Right hip: He exhibits decreased range of motion, decreased strength, tenderness, bony tenderness and deformity.  Neurological: He is alert and oriented to person, place, and time.  Skin: Skin is warm and dry.  Psychiatric: He has a normal mood and affect.    Assessment/Plan: Right hip displaced femoral neck fracture  I talked him in length about his injury.  I discussed nonoperative and operative treatment options.  I would absolutely recommend a total hip arthroplasty projecting or approach for this fracture.  Given his high level of function activity, this would be a better treatment course for him in my opinion.  Given the displaced nature of the fracture I do not feel cannulated screws are warranted because there is a high likelihood that he will develop nonunion and osteonecrosis.  I do not believe the hemiarthroplasty is also warranted given the fact that he is very active at 78 years old and I feel that this would be a better option with a direct anterior hip surgery.  At a long and thorough discussion with him and his wife about this.  I talked about the risk and benefits of surgery.  I talked about his intraoperative and  postoperative course as well.  All question concerns were answered addressed.  Our plan is to proceed to surgery and just a little bit today since he has been n.p.o. and cleared by medicine.  Mcarthur Rossetti 10/18/2018, 4:38 PM

## 2018-10-18 NOTE — ED Notes (Signed)
Orthopedic surgeon at bedside.

## 2018-10-18 NOTE — Anesthesia Postprocedure Evaluation (Signed)
Anesthesia Post Note  Patient: Jerome Kelley  Procedure(s) Performed: TOTAL HIP ARTHROPLASTY ANTERIOR APPROACH (Right Hip)     Patient location during evaluation: PACU Anesthesia Type: Spinal Level of consciousness: awake and alert Pain management: pain level controlled Vital Signs Assessment: post-procedure vital signs reviewed and stable Respiratory status: spontaneous breathing and respiratory function stable Cardiovascular status: blood pressure returned to baseline and stable Postop Assessment: spinal receding Anesthetic complications: no    Last Vitals:  Vitals:   10/18/18 1900 10/18/18 1945  BP: (!) 115/93   Pulse: 82   Resp: 16   Temp:  37.1 C  SpO2: 100%     Last Pain:  Vitals:   10/18/18 1930  TempSrc:   PainSc: 0-No pain                 [name] DANIEL

## 2018-10-18 NOTE — ED Notes (Signed)
Hospitalist at bedside 

## 2018-10-18 NOTE — ED Provider Notes (Signed)
[facility] DEPT Provider Note   CSN: [id number] Arrival date & time: 10/18/18  1256    History   Chief Complaint Chief Complaint  Patient presents with  . Fall    Patient fell on right hip about an hour ago while at [employer] office.    HPI Jerome Kelley is a 78 y.o. male.     The history is provided by the patient and medical records. No language interpreter was used.  Hip Pain  This is a new problem. The current episode started less than 1 hour ago. The problem occurs constantly. The problem has not changed since onset.Pertinent negatives include no chest pain, no abdominal pain, no headaches and no shortness of breath. The symptoms are aggravated by walking. Nothing relieves the symptoms. He has tried nothing for the symptoms. The treatment provided no relief.    Past Medical History:  Diagnosis Date  . Colon cancer 09/18/2011  . Lung cancer, upper lobe 09/18/2011    Patient Active Problem List   Diagnosis Date Noted  . Colon cancer ([place]) 09/18/2011  . Lung cancer, upper lobe ([place]) 09/18/2011    Past Surgical History:  Procedure Laterality Date  . COLON SURGERY  2010        Home Medications    Prior to Admission medications   Medication Sig Start Date End Date Taking? Authorizing Provider  aspirin (ASPIRIN EC) 81 MG EC tablet Take 81 mg by mouth daily. Swallow whole.    [provider]  benazepril (LOTENSIN) 10 MG tablet Take 10 mg by mouth daily.    [provider]    Family History No family history on file.  Social History Social History   Tobacco Use  . Smoking status: Current Every Day Smoker    Packs/day: 1.00    Years: 58.00    Pack years: 58.00    Types: Cigarettes    Start date: 09/03/1956  . Smokeless tobacco: Never Used  . Tobacco comment: does not want to quit  Substance Use Topics  . Alcohol use: Not on file  . Drug use: Not on file     Allergies   Patient has no known  allergies.   Review of Systems Review of Systems  Constitutional: Negative for chills, diaphoresis, fatigue and fever.  HENT: Negative for congestion and rhinorrhea.   Respiratory: Negative for chest tightness, shortness of breath and wheezing.   Cardiovascular: Negative for chest pain and palpitations.  Gastrointestinal: Negative for abdominal pain, constipation, diarrhea, nausea and vomiting.  Genitourinary: Negative for dysuria and flank pain.  Musculoskeletal: Negative for neck pain and neck stiffness.  Skin: Negative for rash and wound.  Neurological: Negative for weakness, light-headedness, numbness and headaches.  Psychiatric/Behavioral: Negative for agitation and confusion.  All other systems reviewed and are negative.    Physical Exam Updated Vital Signs BP (!) 161/89   Pulse 63   Temp 97.6 F (36.4 C)   Resp 16   Ht 5\' 8"  (1.727 m)   Wt 80.7 kg   SpO2 97%   BMI 27.06 kg/m   Physical Exam Vitals signs and nursing note reviewed.  Constitutional:      General: He is not in acute distress.    Appearance: He is well-developed. He is not ill-appearing, toxic-appearing or diaphoretic.  HENT:     Head: Normocephalic and atraumatic.     Nose: Nose normal. No congestion or rhinorrhea.     Mouth/Throat:     Mouth: Mucous membranes  are moist.     Pharynx: No oropharyngeal exudate or posterior oropharyngeal erythema.  Eyes:     Conjunctiva/sclera: Conjunctivae normal.     Pupils: Pupils are equal, round, and reactive to light.  Neck:     Musculoskeletal: Neck supple.  Cardiovascular:     Rate and Rhythm: Normal rate and regular rhythm.     Heart sounds: No murmur.  Pulmonary:     Effort: Pulmonary effort is normal. No respiratory distress.     Breath sounds: Normal breath sounds. No wheezing, rhonchi or rales.  Chest:     Chest wall: No tenderness.  Abdominal:     General: There is no distension.     Palpations: Abdomen is soft.     Tenderness: There is no  abdominal tenderness. There is no right CVA tenderness, left CVA tenderness or guarding.  Musculoskeletal:        General: Tenderness, deformity and signs of injury present.     Right hip: He exhibits tenderness, bony tenderness and deformity. He exhibits no laceration.     Right lower leg: No edema.     Left lower leg: No edema.  Skin:    General: Skin is warm and dry.     Capillary Refill: Capillary refill takes less than 2 seconds.     Findings: No rash.  Neurological:     General: No focal deficit present.     Mental Status: He is alert.     Sensory: No sensory deficit.     Motor: No weakness.      ED Treatments / Results  Labs (all labs ordered are listed, but only abnormal results are displayed) Labs Reviewed  CBC WITH DIFFERENTIAL/PLATELET - Abnormal; Notable for the following components:      Result Value   MCV 104.8 (*)    MCH 35.0 (*)    Platelets 120 (*)    All other components within normal limits  BASIC METABOLIC PANEL - Abnormal; Notable for the following components:   Calcium 8.8 (*)    All other components within normal limits  CBC  CREATININE, SERUM    EKG EKG Interpretation  Date/Time:  Friday October 18 2018 13:43:36 EST Ventricular Rate:  58 PR Interval:    QRS Duration: 89 QT Interval:  420 QTC Calculation: 413 R Axis:   29 Text Interpretation:  Sinus rhythm Minimal ST depression, lateral leads When compared to prior, no significant changes seen.  No STEMI Confirmed by Antony Blackbird [phone]) on 10/18/2018 1:55:48 PM   Radiology Dg Chest Port 1 View  Result Date: 10/18/2018 CLINICAL DATA:  Status post fall. EXAM: PORTABLE CHEST 1 VIEW COMPARISON:  None. FINDINGS: The heart size and mediastinal contours are stable. Surgical clips project over the right hilum. Scarring of lateral left lung base is unchanged. There is no focal pneumonia or pleural effusion. No pulmonary edema is noted. The visualized skeletal structures are unremarkable. IMPRESSION: No  active cardiopulmonary disease. Electronically Signed   By: Abelardo Diesel M.D.   On: 10/18/2018 15:48   Dg Hip Unilat W Or Wo Pelvis 2-3 Views Right  Result Date: 10/18/2018 CLINICAL DATA:  Right hip pain secondary to a fall today. EXAM: DG HIP (WITH OR WITHOUT PELVIS) 2-3V RIGHT COMPARISON:  None. FINDINGS: There is a slightly displaced fracture through the right femoral neck. No significant angulation. No other abnormality. IMPRESSION: Right femoral neck fracture. Electronically Signed   By: Lorriane Shire M.D.   On: 10/18/2018 14:35    Procedures  Procedures (including critical care time)  Medications Ordered in ED Medications  HYDROcodone-acetaminophen (NORCO/VICODIN) 5-325 MG per tablet 1-2 tablet (has no administration in time range)  morphine 2 MG/ML injection 0.5 mg (has no administration in time range)  enoxaparin (LOVENOX) injection 40 mg (has no administration in time range)  morphine 4 MG/ML injection 4 mg (4 mg Intravenous Given 10/18/18 1339)     Initial Impression / Assessment and Plan / ED Course  I have reviewed the triage vital signs and the nursing notes.  Pertinent labs & imaging results that were available during my care of the patient were reviewed by me and considered in my medical decision making (see chart for details).        Jerome Kelley is a 78 y.o. male with a past medical history significant for hypertension, prior colon cancer status post colon surgery, and lung cancer who presents with right hip pain after fall.  Patient reports that he was at the vet with his dog when the leash wrapped around his legs and caused him to fall to the ground.  He reports landing on his right hip with immediate onset of severe pain and deformity.  He has reports no previous fractures hip dislocations or hip surgeries.  He denies headache, neck pain, neck stiffness.  No chest pain back pain or abdominal pain.  No left leg symptoms.  He denies numbness, tingling, or weakness of the  ankle or foot.  He denies any lacerations or bleeding.  Reports the pain is up to 10 out of 10 when he tries to move it but is mild with rest.  On exam, patient has obvious deformity to right hip.  Patient has tenderness in the right hip area.  Patient's knee and ankle are nontender.  No laceration or discoloration seen.  Palpable DP pulse.  Normal sensation and strength at the ankle.  Abdomen nontender, lungs clear chest nontender.  Patient surgical scar abdomen.  Patient was given pain medication and will have x-ray of the right hip to further evaluate.  Clinically I am concerned about dislocation or fracture.  Anticipate reassessment after imaging.  Patient's last p.o. was an Egg Sandwich at 9 AM.  Patient will have screening laboratory testing for either preoperative or pre-sedation screening.  X-ray of the leg showed femoral neck fracture.   Orth PDX was called after discovery of hip fracture.  No dislocation seen.  Orthopedics will see patient and will likely perform surgery today.  They requested patient remain n.p.o.  Other preoperative imaging labs and EKG were ordered.  Hospitalist team will admit for further management.   Final Clinical Impressions(s) / ED Diagnoses   Final diagnoses:  Fall, initial encounter  Closed fracture of neck of right femur, initial encounter ([place])     Clinical Impression: 1. Fall, initial encounter   2. Fall   3. Closed fracture of neck of right femur, initial encounter ([place])     Disposition: Admit  This note was prepared with assistance of Dragon voice recognition software. Occasional wrong-word or sound-a-like substitutions may have occurred due to the inherent limitations of voice recognition software.     Ciro Tashiro, Gwenyth Allegra, MD 10/18/18 [phone]

## 2018-10-19 LAB — BASIC METABOLIC PANEL
Anion gap: 5 (ref 5–15)
BUN: 15 mg/dL (ref 8–23)
CO2: 24 mmol/L (ref 22–32)
Calcium: 8.1 mg/dL — ABNORMAL LOW (ref 8.9–10.3)
Chloride: 107 mmol/L (ref 98–111)
Creatinine, Ser: 0.72 mg/dL (ref 0.61–1.24)
GFR calc Af Amer: >60 mL/min (ref >60)
GFR calc non Af Amer: >60 mL/min (ref >60)
Glucose, Bld: 166 mg/dL — ABNORMAL HIGH (ref 70–99)
Potassium: 3.8 mmol/L (ref 3.5–5.1)
Sodium: 136 mmol/L (ref 135–145)

## 2018-10-19 LAB — CBC
HCT: 37.1 % — ABNORMAL LOW (ref 39.0–52.0)
Hemoglobin: 12.5 g/dL — ABNORMAL LOW (ref 13.0–17.0)
MCH: 34.8 pg — ABNORMAL HIGH (ref 26.0–34.0)
MCHC: 33.7 g/dL (ref 30.0–36.0)
MCV: 103.3 fL — ABNORMAL HIGH (ref 80.0–100.0)
Platelets: 103 10*3/uL — ABNORMAL LOW (ref 150–400)
RBC: 3.59 MIL/uL — ABNORMAL LOW (ref 4.22–5.81)
RDW: 13.7 % (ref 11.5–15.5)
WBC: 9.5 10*3/uL (ref 4.0–10.5)
nRBC: 0 % (ref 0.0–0.2)

## 2018-10-19 MED ORDER — HYDRALAZINE HCL 20 MG/ML IJ SOLN: 10.0000 mg | INTRAMUSCULAR | Status: DC | PRN

## 2018-10-19 NOTE — Progress Notes (Signed)
Subjective: 1 Day Post-Op Procedure(s) (LRB): TOTAL HIP ARTHROPLASTY ANTERIOR APPROACH (Right) Patient reports pain as moderate.  Tolerated surgery well last evening.  Objective: Vital signs in last 24 hours: Temp:  [97.6 F (36.4 C)-99 F (37.2 C)] 97.7 F (36.5 C) (03/07 0505) Pulse Rate:  [57-83] 64 (03/07 0505) Resp:  [10-19] 14 (03/07 0505) BP: (104-190)/(58-93) 164/70 (03/07 0505) SpO2:  [95 %-100 %] 96 % (03/07 0505) Weight:  [80.7 kg] 80.7 kg (03/06 1955)  Intake/Output from previous day: 03/06 0701 - 03/07 0700 In: 2726.4 [P.O.:530; I.V.:2120.1; IV Piggyback:76.3] Out: 835 [Urine:685; Blood:150] Intake/Output this shift: No intake/output data recorded.  Recent Labs    10/18/18 1343 10/19/18 0351  HGB 15.4 12.5*   Recent Labs    10/18/18 1343 10/19/18 0351  WBC 6.0 9.5  RBC 4.40 3.59*  HCT 46.1 37.1*  PLT 120* 103*   Recent Labs    10/18/18 1343 10/19/18 0351  NA 140 136  K 3.9 3.8  CL 106 107  CO2 27 24  BUN 13 15  CREATININE 0.77 0.72  GLUCOSE 98 166*  CALCIUM 8.8* 8.1*   No results for input(s): LABPT, INR in the last 72 hours.  Sensation intact distally Intact pulses distally Dorsiflexion/Plantar flexion intact Incision: dressing C/D/I   Assessment/Plan: 1 Day Post-Op Procedure(s) (LRB): TOTAL HIP ARTHROPLASTY ANTERIOR APPROACH (Right) Up with therapy - Can WBAT, no hip precautions 81 mg aspirin twice daily for DVT coverage.       Mcarthur Rossetti 10/19/2018, 9:32 AM

## 2018-10-19 NOTE — Progress Notes (Addendum)
PROGRESS NOTE  Jerome Kelley  WLS:[id number] DOB: 1941/04/03 DOA: 10/18/2018 PCP: Kelton Pillar, MD   Brief Narrative: Jerome Kelley is a 78 y.o. male with a history of colon and lung CA treated in 2010, and HTN on benazepril who presented after a fall tripping over dog's leash on 3/6, sustaining displaced right femoral neck fracture. Orthopedics was consulted, performed anterior approach THA 3/6, medicine has admitted the patient.  Assessment & Plan: Principal Problem:   Hip fracture ([place]) Active Problems:   Colon cancer ([place])   Lung cancer, upper lobe ([place])   Essential hypertension   Closed fracture of neck of right femur ([facility])  Acute postoperative blood loss anemia: Hgb 12.5 from 15.4.  - Monitor in AM.  - Check anemia panel.  HTN: BP currently low-normal - Hold benazepril for now.  - IV hydralazine prn  Thrombocytopenia: Unclear etiology, but is chronic.  - Monitor. ASA DVT ppx ok w/count >50k.   Fall with right hip fracture:  - PT/OT  - WBAT, no hip precautions - ASA 81mg  BID for DVT ppx  History of lung CA and colon CA: s/p resection of both remotely and adjuvant chemo for colon CA, last seen by Dr. Marin Olp 2015. NED currently  Tobacco use:  - Cessation counseling provided.  DVT prophylaxis: Aspirin 81mg  po BID Code Status: Full Family Communication: Multiple at bedside Disposition Plan: Per PT recommendations.  Consultants:   Orthopedics  Procedures:   Right anterior THA by Dr. Ninfa Linden 10/18/2018  Antimicrobials:  None   Subjective: Feels well, pain controlled. No dyspnea, chest pain.   Objective: Vitals:   10/19/18 0505 10/19/18 0955 10/19/18 1026 10/19/18 1527  BP: (!) 164/70 (!) 155/62 139/62 (!) 117/52  Pulse: 64 67 71 71  Resp: 14  16 16   Temp: 97.7 F (36.5 C)  97.8 F (36.6 C) 97.9 F (36.6 C)  TempSrc: Oral  Oral   SpO2: 96% 97% 98% 96%  Weight:      Height:        Intake/Output Summary (Last 24 hours) at 10/19/2018 1820 Last data  filed at 10/19/2018 1730 Gross per 24 hour  Intake 2746.86 ml  Output 1340 ml  Net 1406.86 ml   Filed Weights   10/18/18 1310 10/18/18 1955  Weight: 80.7 kg 80.7 kg    Gen: 78 y.o. male in no distress  Pulm: Non-labored breathing room air. Clear to auscultation bilaterally.  CV: Regular rate and rhythm. No murmur, rub, or gallop. No JVD, no pedal edema. GI: Abdomen soft, non-tender, non-distended, with normoactive bowel sounds. No organomegaly or masses felt. Ext: Warm, no deformities. RLE w/intact strength, sensation, brisk cap refill. Skin: No rashes, lesions or ulcers. Right hip wound with c/d/i dressing. Neuro: Alert and oriented. No focal neurological deficits. Psych: Judgement and insight appear normal. Mood & affect appropriate.   Data Reviewed: I have personally reviewed following labs and imaging studies  CBC: Recent Labs  Lab 10/18/18 1343 10/19/18 0351  WBC 6.0 9.5  NEUTROABS 4.0  --   HGB 15.4 12.5*  HCT 46.1 37.1*  MCV 104.8* 103.3*  PLT 120* 811*   Basic Metabolic Panel: Recent Labs  Lab 10/18/18 1343 10/19/18 0351  NA 140 136  K 3.9 3.8  CL 106 107  CO2 27 24  GLUCOSE 98 166*  BUN 13 15  CREATININE 0.77 0.72  CALCIUM 8.8* 8.1*   GFR: Estimated Creatinine Clearance: 74.8 mL/min (by C-G formula based on SCr of 0.72 mg/dL). Liver Function  Tests: No results for input(s): AST, ALT, ALKPHOS, BILITOT, PROT, ALBUMIN in the last 168 hours. No results for input(s): LIPASE, AMYLASE in the last 168 hours. No results for input(s): AMMONIA in the last 168 hours. Coagulation Profile: No results for input(s): INR, PROTIME in the last 168 hours. Cardiac Enzymes: No results for input(s): CKTOTAL, CKMB, CKMBINDEX, TROPONINI in the last 168 hours. BNP (last 3 results) No results for input(s): PROBNP in the last 8760 hours. HbA1C: No results for input(s): HGBA1C in the last 72 hours. CBG: No results for input(s): GLUCAP in the last 168 hours. Lipid Profile: No  results for input(s): CHOL, HDL, LDLCALC, TRIG, CHOLHDL, LDLDIRECT in the last 72 hours. Thyroid Function Tests: No results for input(s): TSH, T4TOTAL, FREET4, T3FREE, THYROIDAB in the last 72 hours. Anemia Panel: No results for input(s): VITAMINB12, FOLATE, FERRITIN, TIBC, IRON, RETICCTPCT in the last 72 hours. Urine analysis:    Component Value Date/Time   COLORURINE YELLOW 11/04/2009 1017   APPEARANCEUR CLEAR 11/04/2009 1017   LABSPEC 1.009 11/04/2009 1017   PHURINE 6.5 11/04/2009 1017   GLUCOSEU NEGATIVE 11/04/2009 1017   HGBUR NEGATIVE 11/04/2009 1017   BILIRUBINUR NEGATIVE 11/04/2009 1017   KETONESUR NEGATIVE 11/04/2009 1017   PROTEINUR NEGATIVE 11/04/2009 1017   UROBILINOGEN 1.0 11/04/2009 1017   NITRITE NEGATIVE 11/04/2009 1017   LEUKOCYTESUR  11/04/2009 1017    NEGATIVE MICROSCOPIC NOT DONE ON URINES WITH NEGATIVE PROTEIN, BLOOD, LEUKOCYTES, NITRITE, OR GLUCOSE <1000 mg/dL.   No results found for this or any previous visit (from the past 240 hour(s)).    Radiology Studies: Dg Pelvis Portable  Result Date: 10/18/2018 CLINICAL DATA:  Status post right total hip arthroplasty. EXAM: PORTABLE PELVIS 1-2 VIEWS COMPARISON:  Preoperative radiograph earlier this day. FINDINGS: Right hip arthroplasty in expected alignment. No periprosthetic lucency or fracture. Recent postsurgical change includes air and edema in the subcutaneous tissues and lateral skin staples. No other change from preoperative imaging. IMPRESSION: Right hip arthroplasty without immediate postoperative complication. Electronically Signed   By: Keith Rake M.D.   On: 10/18/2018 20:27   Dg Chest Port 1 View  Result Date: 10/18/2018 CLINICAL DATA:  Status post fall. EXAM: PORTABLE CHEST 1 VIEW COMPARISON:  None. FINDINGS: The heart size and mediastinal contours are stable. Surgical clips project over the right hilum. Scarring of lateral left lung base is unchanged. There is no focal pneumonia or pleural effusion. No  pulmonary edema is noted. The visualized skeletal structures are unremarkable. IMPRESSION: No active cardiopulmonary disease. Electronically Signed   By: Abelardo Diesel M.D.   On: 10/18/2018 15:48   Dg C-arm 1-60 Min  Result Date: 10/18/2018 CLINICAL DATA:  Right femur fracture EXAM: OPERATIVE right HIP (WITH PELVIS IF PERFORMED) 6 VIEWS TECHNIQUE: Fluoroscopic spot image(s) were submitted for interpretation post-operatively. COMPARISON:  10/18/2018 FINDINGS: Six low resolution intraoperative spot views of the right hip. Total fluoroscopy time was 18 seconds. The images demonstrate a right hip replacement with normal alignment. IMPRESSION: Intraoperative fluoroscopic assistance provided during right hip replacement surgery Electronically Signed   By: Donavan Foil M.D.   On: 10/18/2018 19:12   Dg Hip Operative Unilat W Or W/o Pelvis Right  Result Date: 10/18/2018 CLINICAL DATA:  Right femur fracture EXAM: OPERATIVE right HIP (WITH PELVIS IF PERFORMED) 6 VIEWS TECHNIQUE: Fluoroscopic spot image(s) were submitted for interpretation post-operatively. COMPARISON:  10/18/2018 FINDINGS: Six low resolution intraoperative spot views of the right hip. Total fluoroscopy time was 18 seconds. The images demonstrate a right hip replacement  with normal alignment. IMPRESSION: Intraoperative fluoroscopic assistance provided during right hip replacement surgery Electronically Signed   By: Donavan Foil M.D.   On: 10/18/2018 19:12   Dg Hip Unilat W Or Wo Pelvis 2-3 Views Right  Result Date: 10/18/2018 CLINICAL DATA:  Right hip pain secondary to a fall today. EXAM: DG HIP (WITH OR WITHOUT PELVIS) 2-3V RIGHT COMPARISON:  None. FINDINGS: There is a slightly displaced fracture through the right femoral neck. No significant angulation. No other abnormality. IMPRESSION: Right femoral neck fracture. Electronically Signed   By: Lorriane Shire M.D.   On: 10/18/2018 14:35    Scheduled Meds: . aspirin  81 mg Oral BID  . benazepril   20 mg Oral Daily  . Difluprednate  1 drop Left Eye TID  . docusate sodium  100 mg Oral BID  . pantoprazole  40 mg Oral Daily   Continuous Infusions: . sodium chloride Stopped (10/19/18 1730)  . lactated ringers 100 mL/hr at 10/18/18 1706  . methocarbamol (ROBAXIN) IV       LOS: 1 day   Time spent: 25 minutes.  Patrecia Pour, MD [facility] [email or web address] Password [email or web address] 10/19/2018, 6:20 PM

## 2018-10-19 NOTE — Care Management Note (Addendum)
Case Management Note  Patient Details  Name: JAGAR LUA MRN: [id number] Date of Birth: 06-17-1941  Subjective/Objective:   Pt s/p  Right THA                Action/Plan: Spoke to pt and son, offered choice for HH/medicare list given and placed on chart. [place] for RW and 3n1 bedside commode to be delivered to room. Waiting PT/OT recommendation for SNF or HH. Pt agreeable to [facility] for [facility].   10/20/2018 Contacted [name] with new referral.   Expected Discharge Date:                Expected Discharge Plan:  [place]  In-House Referral:  Clinical Social Work  Discharge planning Services  CM Consult  Post Acute Care Choice:  Home Health Choice offered to:  Patient, Adult Children  DME Arranged:  3-N-1, [place] rolling DME Agency:  [facility]  HH Arranged:  PT, Nurse's Aide, OT HH Agency:  [facility]  Status of Service:  In process, will continue to follow  If discussed at Long Length of Stay Meetings, dates discussed:    Additional Comments:  Erenest Rasher, RN 10/19/2018, 2:21 PM

## 2018-10-19 NOTE — Evaluation (Addendum)
Physical Therapy Evaluation Patient Details Name: Jerome Kelley MRN: [id number] DOB: 03-25-1941 Today's Date: 10/19/2018   History of Present Illness  s/p R DA THA secondary to right hip fx  Clinical Impression  Pt is s/p R DA THA 2* to fx resulting in the deficits listed below (see PT Problem List).  Pt will benefit from PT in acute setting, anticipate steady progress. Will need HHPT at d/c  Pt will benefit from skilled PT to increase their independence and safety with mobility to allow discharge to the venue listed below.      Follow Up Recommendations Home health PT    Equipment Recommendations  Rolling walker with 5" wheels;3in1 (PT)(may borrow 3in1)    Recommendations for Other Services       Precautions / Restrictions Precautions Precautions: Fall Restrictions Weight Bearing Restrictions: No Other Position/Activity Restrictions: WBAT      Mobility  Bed Mobility Overal bed mobility: Needs Assistance Bed Mobility: Supine to Sit     Supine to sit: Mod assist     General bed mobility comments: assist with RLE and to elevate trunk  Transfers Overall transfer level: Needs assistance Equipment used: Rolling walker (2 wheeled) Transfers: Sit to/from Stand Sit to Stand: Min assist         General transfer comment: assist to rise and stabilize, cues for hand placement  Ambulation/Gait Ambulation/Gait assistance: Min assist;Min guard Gait Distance (Feet): 60 Feet Assistive device: Rolling walker (2 wheeled) Gait Pattern/deviations: Step-to pattern;Decreased weight shift to right;Trunk flexed     General Gait Details: verbal cues for sequence and RW position, safety with turns  Stairs            Wheelchair Mobility    Modified Rankin (Stroke Patients Only)       Balance Overall balance assessment: Needs assistance;History of Falls   Sitting balance-Leahy Scale: Fair       Standing balance-Leahy Scale: Poor Standing balance comment: reliant on  UEs                             Pertinent Vitals/Pain Pain Assessment: 0-10 Pain Score: 4  Pain Location: right hip  Pain Descriptors / Indicators: Sore;Discomfort Pain Intervention(s): Limited activity within patient's tolerance;Monitored during session    Home Living Family/patient expects to be discharged to:: Private residence Living Arrangements: Spouse/significant other Available Help at Discharge: Family Type of Home: Mobile home(2 steps to enter end of the living area) Home Access: Level entry     Home Layout: One level Home Equipment: Bedside commode      Prior Function Level of Independence: Independent               Hand Dominance        Extremity/Trunk Assessment   Upper Extremity Assessment Upper Extremity Assessment: Defer to OT evaluation    Lower Extremity Assessment Lower Extremity Assessment: RLE deficits/detail RLE Deficits / Details: ankle WFL; knee and hip grossly 2+/5 to 3/5, limited by post op pain and weakness       Communication   Communication: No difficulties  Cognition Arousal/Alertness: Awake/alert Behavior During Therapy: WFL for tasks assessed/performed Overall Cognitive Status: Within Functional Limits for tasks assessed                                        General Comments  Exercises  ankle pumps x10 Quad sets x 10   Assessment/Plan    PT Assessment Patient needs continued PT services  PT Problem List Decreased strength;Decreased activity tolerance;Decreased mobility;Decreased knowledge of use of DME;Decreased safety awareness;Pain       PT Treatment Interventions DME instruction;Gait training;Therapeutic activities;Functional mobility training;Therapeutic exercise;Patient/family education;Stair training    PT Goals (Current goals can be found in the Care Plan section)  Acute Rehab PT Goals Patient Stated Goal: home as soon as possible PT Goal Formulation: With patient Time  For Goal Achievement: 10/26/18 Potential to Achieve Goals: Good    Frequency Min 3X/week   Barriers to discharge        Co-evaluation               AM-PAC PT "6 Clicks" Mobility  Outcome Measure Help needed turning from your back to your side while in a flat bed without using bedrails?: A Lot Help needed moving from lying on your back to sitting on the side of a flat bed without using bedrails?: A Lot Help needed moving to and from a bed to a chair (including a wheelchair)?: A Little Help needed standing up from a chair using your arms (e.g., wheelchair or bedside chair)?: A Little Help needed to walk in hospital room?: A Little Help needed climbing 3-5 steps with a railing? : A Lot 6 Click Score: 15    End of Session Equipment Utilized During Treatment: Gait belt Activity Tolerance: Patient tolerated treatment well Patient left: in chair;with call bell/phone within reach;with chair alarm set;with family/visitor present Nurse Communication: Mobility status PT Visit Diagnosis: Difficulty in walking, not elsewhere classified (R26.2)    Time: 3567-0141 PT Time Calculation (min) (ACUTE ONLY): 23 min   Charges:   PT Evaluation $PT Eval Low Complexity: 1 Low PT Treatments $Gait Training: 8-22 mins        Kenyon Ana, PT  Pager: [phone] Acute Rehab Dept [facility]): [phone]   10/19/2018   [facility] 10/19/2018, 3:03 PM

## 2018-10-19 NOTE — Plan of Care (Signed)

## 2018-10-20 LAB — FERRITIN: Ferritin: 337 ng/mL — ABNORMAL HIGH (ref 24–336)

## 2018-10-20 LAB — CBC
HCT: 36.3 % — ABNORMAL LOW (ref 39.0–52.0)
Hemoglobin: 12.3 g/dL — ABNORMAL LOW (ref 13.0–17.0)
MCH: 34.9 pg — ABNORMAL HIGH (ref 26.0–34.0)
MCHC: 33.9 g/dL (ref 30.0–36.0)
MCV: 103.1 fL — ABNORMAL HIGH (ref 80.0–100.0)
Platelets: 98 10*3/uL — ABNORMAL LOW (ref 150–400)
RBC: 3.52 MIL/uL — ABNORMAL LOW (ref 4.22–5.81)
RDW: 13.8 % (ref 11.5–15.5)
WBC: 7.7 10*3/uL (ref 4.0–10.5)
nRBC: 0 % (ref 0.0–0.2)

## 2018-10-20 LAB — IRON AND TIBC
Iron: 34 ug/dL — ABNORMAL LOW (ref 45–182)
Saturation Ratios: 14 % — ABNORMAL LOW (ref 17.9–39.5)
TIBC: 241 ug/dL — ABNORMAL LOW (ref 250–450)
UIBC: 207 ug/dL

## 2018-10-20 LAB — VITAMIN B12: Vitamin B-12: 77 pg/mL — ABNORMAL LOW (ref 180–914)

## 2018-10-20 LAB — FOLATE: Folate: 4 ng/mL — ABNORMAL LOW (ref >5.9)

## 2018-10-20 MED ORDER — OXYCODONE HCL 5 MG PO TABS: 5.0000 mg | ORAL_TABLET | ORAL | 0 refills | Status: DC | PRN

## 2018-10-20 MED ORDER — FOLIC ACID 1 MG PO TABS
1.0000 mg | ORAL_TABLET | Freq: Every day | ORAL | Status: DC
  Administered 2018-10-20: 1 mg via ORAL
  Filled 2018-10-20: qty 1

## 2018-10-20 MED ORDER — METHOCARBAMOL 500 MG PO TABS: 500.0000 mg | ORAL_TABLET | Freq: Four times a day (QID) | ORAL | 1 refills | Status: DC | PRN

## 2018-10-20 MED ORDER — ASPIRIN 81 MG PO CHEW: 81.0000 mg | CHEWABLE_TABLET | Freq: Two times a day (BID) | ORAL | 0 refills | Status: AC

## 2018-10-20 MED ORDER — VITAMIN B-12 1000 MCG PO TABS: 1000.0000 ug | ORAL_TABLET | Freq: Every day | ORAL | 0 refills | Status: AC

## 2018-10-20 MED ORDER — CYANOCOBALAMIN 1000 MCG/ML IJ SOLN
1000.0000 ug | Freq: Once | INTRAMUSCULAR | Status: AC
  Administered 2018-10-20: 1000 ug via INTRAMUSCULAR
  Filled 2018-10-20 (×2): qty 1

## 2018-10-20 MED ORDER — FOLIC ACID 1 MG PO TABS: 1.0000 mg | ORAL_TABLET | Freq: Every day | ORAL | 0 refills | Status: DC

## 2018-10-20 NOTE — Evaluation (Signed)
Occupational Therapy Evaluation Patient Details Name: Jerome Kelley MRN: [id number] DOB: 1940-11-04 Today's Date: 10/20/2018    History of Present Illness s/p R DA THA secondary to right hip fx   Clinical Impression   Pt admitted with above diagnoses, R hip pain limiting ability to engage in BADL at desired level of ind. PTA pt independent with BADL/IADL, lives with wife who is able to assist as needed. Pt is min guard for functional BADL transfers this date, able to complete functional mobility to BR at min guard. Introduced LB ADL equip, instruction provided. Pt stating he would like wife to assist. Education given to wife as she arrived at end of session. No further OT follow up indicated at this time. Will continue to follow while here, but anticipate d/c this date.    Follow Up Recommendations  No OT follow up;Supervision - Intermittent    Equipment Recommendations  None recommended by OT(3:1 delivered, pt deferred LB equip)    Recommendations for Other Services       Precautions / Restrictions Precautions Precautions: Fall Restrictions Weight Bearing Restrictions: No Other Position/Activity Restrictions: WBAT      Mobility Bed Mobility               General bed mobility comments: up in chair on arrival  Transfers Overall transfer level: Needs assistance Equipment used: Rolling walker (2 wheeled) Transfers: Sit to/from Stand Sit to Stand: Min guard         General transfer comment: cues for hand placement    Balance Overall balance assessment: Needs assistance;History of Falls Sitting-balance support: No upper extremity supported;Bilateral upper extremity supported Sitting balance-Leahy Scale: Good     Standing balance support: Bilateral upper extremity supported;During functional activity Standing balance-Leahy Scale: Poor Standing balance comment: reliant on external support                           ADL either performed or assessed with  clinical judgement   ADL Overall ADL's : Needs assistance/impaired Eating/Feeding: Independent   Grooming: Min guard;Standing;Oral care   Upper Body Bathing: Set up;Sitting   Lower Body Bathing: Minimal assistance;Sit to/from stand;Sitting/lateral leans;Cueing for compensatory techniques;With adaptive equipment   Upper Body Dressing : Set up;Sitting   Lower Body Dressing: Minimal assistance;Sit to/from stand;Sitting/lateral leans;Cueing for compensatory techniques;With adaptive equipment   Toilet Transfer: Min Marine scientist Details (indicate cue type and reason): BSC over toilet Toileting- Clothing Manipulation and Hygiene: Supervision/safety;Sit to/from stand;Sitting/lateral lean   Tub/ Shower Transfer: Min guard;Shower seat;3 in Art therapist mobility during ADLs: Min guard;Rolling walker General ADL Comments: pain in LB limiting ADL, intro LB ADL equip, states he wants wife to assist     Vision Baseline Vision/History: No visual deficits Patient Visual Report: No change from baseline       Perception     Praxis      Pertinent Vitals/Pain Pain Assessment: Faces Faces Pain Scale: Hurts a little bit Pain Location: right hip  Pain Descriptors / Indicators: Sore;Discomfort Pain Intervention(s): Limited activity within patient's tolerance;Monitored during session     Hand Dominance     Extremity/Trunk Assessment Upper Extremity Assessment Upper Extremity Assessment: Overall WFL for tasks assessed   Lower Extremity Assessment Lower Extremity Assessment: Defer to PT evaluation       Communication Communication Communication: No difficulties   Cognition Arousal/Alertness: Awake/alert Behavior During Therapy: WFL for tasks assessed/performed Overall Cognitive Status: Within Functional Limits for  tasks assessed                                     General Comments       Exercises    Shoulder  Instructions      Home Living Family/patient expects to be discharged to:: Private residence Living Arrangements: Spouse/significant other Available Help at Discharge: Family Type of Home: Mobile home Home Access: Level entry     Home Layout: One level     Bathroom Shower/Tub: Teacher, early years/pre: Standard     Home Equipment: Bedside commode   Additional Comments: BSC delivered in room      Prior Functioning/Environment Level of Independence: Independent                 OT Problem List: Decreased strength;Decreased activity tolerance;Decreased knowledge of use of DME or AE;Impaired balance (sitting and/or standing);Pain      OT Treatment/Interventions: Self-care/ADL training;DME and/or AE instruction;Therapeutic activities;Balance training;Therapeutic exercise;Patient/family education    OT Goals(Current goals can be found in the care plan section) Acute Rehab OT Goals Patient Stated Goal: home  OT Goal Formulation: With patient Time For Goal Achievement: 11/03/18 Potential to Achieve Goals: Good  OT Frequency: Min 2X/week   Barriers to D/C:            Co-evaluation              AM-PAC OT "6 Clicks" Daily Activity     Outcome Measure Help from another person eating meals?: None Help from another person taking care of personal grooming?: A Little Help from another person toileting, which includes using toliet, bedpan, or urinal?: A Little Help from another person bathing (including washing, rinsing, drying)?: A Little Help from another person to put on and taking off regular upper body clothing?: None Help from another person to put on and taking off regular lower body clothing?: A Little 6 Click Score: 20   End of Session Equipment Utilized During Treatment: Gait belt;Rolling walker  Activity Tolerance: Patient tolerated treatment well Patient left: in chair;with call bell/phone within reach;with family/visitor present  OT Visit  Diagnosis: Other abnormalities of gait and mobility (R26.89);Unsteadiness on feet (R26.81);History of falling (Z91.81);Pain Pain - Right/Left: Right Pain - part of body: Hip                Time: 2035-5974 OT Time Calculation (min): 23 min Charges:  OT General Charges $OT Visit: 1 Visit OT Evaluation $OT Eval Low Complexity: 1 Low OT Treatments $Self Care/Home Management : 8-22 mins   Zenovia Jarred, MSOT, OTR/L Behavioral Health OT/ Acute Relief OT [facility] Office: [phone]  Zenovia Jarred 10/20/2018, 11:19 AM

## 2018-10-20 NOTE — Discharge Summary (Signed)
Physician Discharge Summary  [name] VPX:[id number] DOB: 01-04-1941 DOA: 10/18/2018  PCP: Kelton Pillar, MD  Admit date: 10/18/2018 Discharge date: 10/20/2018  Admitted From: Home Disposition: Home   Recommendations for Outpatient Follow-up:  1. Follow up with PCP in 1-2 weeks 2. Please monitor CBC. Started folate and B12 supplementation due to deficiencies and macrocytic anemia. 3. Follow up with orthopedics  Home Health: PT Equipment/Devices: 3 in 1, RW Discharge Condition: Stable CODE STATUS: Full Diet recommendation: Heart healthy  Brief/Interim Summary: Jerome Kelley is a 78 y.o. male with a history of colon and lung CA treated in 2010, thrombocytopenia, tobacco use, and HTN on benazepril who presented after a fall tripping over dog's leash on 3/6, sustaining displaced right femoral neck fracture. Orthopedics was consulted, performed anterior approach THA 3/6, medicine admitted the patient. Postoperative course complicated by mild postoperative anemia and found to have vitamin I62 and folic acid deficiencies which were supplemented. No transfusions required. Therapy has made recommendations and he is stable from orthopedics' perspective for discharge.  Discharge Diagnoses:  Principal Problem:   Hip fracture ([place]) Active Problems:   Colon cancer ([place])   Lung cancer, upper lobe ([place])   Essential hypertension   Closed fracture of neck of right femur ([facility])  Multifactorial anemia: Due to acute postoperative blood loss, chronic disease, and vitamin V03 and folic acid deficiency. Levels of B9 and B12 low, will be supplemented. Iron panel consistent with AOCD, no iron indicated currently. Recheck CBC at follow up. Hgb at discharge is 12.3. No bleeding noted.   HTN: BP currently low-normal - Restart benazepril  Thrombocytopenia: Unclear etiology, but is chronic.  - Monitor. ASA DVT ppx ok w/count >50k.   Fall with right hip fracture:  - PT/OT to continue - WBAT, no hip  precautions - ASA 81mg  BID for DVT ppx  History of lung CA and colon CA: s/p resection of both remotely and adjuvant chemo for colon CA, last seen by Dr. Marin Olp 2015. NED currently  Tobacco use:  - Cessation counseling provided.  Discharge Instructions Discharge Instructions    Diet - low sodium heart healthy   Complete by:  As directed    Discharge instructions   Complete by:  As directed    You were admitted and had surgfery for a right hip fracture. You've done well after surgery and are stable for discharge. You will need to take medications as directed including aspirin to prevent blood clots and pain medication when needed. Follow up with Dr. Trevor Mace office as directed.   You were found to be deficient in vitamin J00 and folic acid. This can cause low blood counts and needs to be supplemented. Please take these once daily everyday and follow up with your primary doctor in the next 2 weeks for a recheck of your levels and blood count.   If you fall or experience worsening pain, seek medical attention right away.   Increase activity slowly   Complete by:  As directed      Allergies as of 10/20/2018   No Known Allergies     Medication List    STOP taking these medications   aspirin EC 81 MG EC tablet Generic drug:  aspirin Replaced by:  aspirin 81 MG chewable tablet     TAKE these medications   aspirin 81 MG chewable tablet Chew 1 tablet (81 mg total) by mouth 2 (two) times daily. Replaces:  aspirin EC 81 MG EC tablet   benazepril 20 MG tablet  Commonly known as:  LOTENSIN Take 20 mg by mouth daily.   Durezol 0.05 % Emul Generic drug:  Difluprednate Place 1 drop into the left eye 3 (three) times daily.   folic acid 1 MG tablet Commonly known as:  FOLVITE Take 1 tablet (1 mg total) by mouth daily. Start taking on:  October 21, 2018   methocarbamol 500 MG tablet Commonly known as:  ROBAXIN Take 1 tablet (500 mg total) by mouth every 6 (six) hours as needed for  muscle spasms.   oxyCODONE 5 MG immediate release tablet Commonly known as:  Oxy IR/ROXICODONE Take 1-2 tablets (5-10 mg total) by mouth every 4 (four) hours as needed for moderate pain (pain score 4-6).   vitamin B-12 1000 MCG tablet Commonly known as:  CYANOCOBALAMIN Take 1 tablet (1,000 mcg total) by mouth daily.      Follow-up Information    Mcarthur Rossetti, MD. Schedule an appointment as soon as possible for a visit in 2 week(s).   Specialty:  Orthopedic Surgery Contact information: [place] [address] [phone]        Kelton Pillar, MD Follow up.   Specialty:  Family Medicine Contact information: [address] [place] [address] [phone]        [place] Follow up.   Why:  [facility] will call to arrange appt Contact information: [place] [phone]         No Known Allergies  Consultations:  Orthopedics  Procedures/Studies: Dg Pelvis Portable  Result Date: 10/18/2018 CLINICAL DATA:  Status post right total hip arthroplasty. EXAM: PORTABLE PELVIS 1-2 VIEWS COMPARISON:  Preoperative radiograph earlier this day. FINDINGS: Right hip arthroplasty in expected alignment. No periprosthetic lucency or fracture. Recent postsurgical change includes air and edema in the subcutaneous tissues and lateral skin staples. No other change from preoperative imaging. IMPRESSION: Right hip arthroplasty without immediate postoperative complication. Electronically Signed   By: Keith Rake M.D.   On: 10/18/2018 20:27   Dg Chest Port 1 View  Result Date: 10/18/2018 CLINICAL DATA:  Status post fall. EXAM: PORTABLE CHEST 1 VIEW COMPARISON:  None. FINDINGS: The heart size and mediastinal contours are stable. Surgical clips project over the right hilum. Scarring of lateral left lung base is unchanged. There is no focal pneumonia or pleural  effusion. No pulmonary edema is noted. The visualized skeletal structures are unremarkable. IMPRESSION: No active cardiopulmonary disease. Electronically Signed   By: Abelardo Diesel M.D.   On: 10/18/2018 15:48   Dg C-arm 1-60 Min  Result Date: 10/18/2018 CLINICAL DATA:  Right femur fracture EXAM: OPERATIVE right HIP (WITH PELVIS IF PERFORMED) 6 VIEWS TECHNIQUE: Fluoroscopic spot image(s) were submitted for interpretation post-operatively. COMPARISON:  10/18/2018 FINDINGS: Six low resolution intraoperative spot views of the right hip. Total fluoroscopy time was 18 seconds. The images demonstrate a right hip replacement with normal alignment. IMPRESSION: Intraoperative fluoroscopic assistance provided during right hip replacement surgery Electronically Signed   By: Donavan Foil M.D.   On: 10/18/2018 19:12   Dg Hip Operative Unilat W Or W/o Pelvis Right  Result Date: 10/18/2018 CLINICAL DATA:  Right femur fracture EXAM: OPERATIVE right HIP (WITH PELVIS IF PERFORMED) 6 VIEWS TECHNIQUE: Fluoroscopic spot image(s) were submitted for interpretation post-operatively. COMPARISON:  10/18/2018 FINDINGS: Six low resolution intraoperative spot views of the right hip. Total fluoroscopy time was 18 seconds. The images demonstrate a right hip replacement with normal alignment. IMPRESSION:  Intraoperative fluoroscopic assistance provided during right hip replacement surgery Electronically Signed   By: Donavan Foil M.D.   On: 10/18/2018 19:12   Dg Hip Unilat W Or Wo Pelvis 2-3 Views Right  Result Date: 10/18/2018 CLINICAL DATA:  Right hip pain secondary to a fall today. EXAM: DG HIP (WITH OR WITHOUT PELVIS) 2-3V RIGHT COMPARISON:  None. FINDINGS: There is a slightly displaced fracture through the right femoral neck. No significant angulation. No other abnormality. IMPRESSION: Right femoral neck fracture. Electronically Signed   By: Lorriane Shire M.D.   On: 10/18/2018 14:35    Right anterior THA by Dr. Ninfa Linden  10/18/2018  Subjective: Wants to go home. Barely used any pain medications, getting up with PT/OT. No bleeding. No lightheadedness, dizziness, dyspnea, chest pain.   Discharge Exam: Vitals:   10/19/18 2233 10/20/18 0556  BP: (!) 156/68 (!) 160/62  Pulse: 76 75  Resp: 15 14  Temp: 98.1 F (36.7 C) 98.6 F (37 C)  SpO2: 94% 97%   General: Pt is alert, awake, not in acute distress Cardiovascular: RRR, S1/S2 +, no rubs, no gallops Respiratory: CTA bilaterally, no wheezing, no rhonchi Abdominal: Soft, NT, ND, bowel sounds + Extremities: No significant edema, no cyanosis. NVI distally in both LEs Skin: Operative dressing c/d/i, compartment soft,   Labs: BNP (last 3 results) No results for input(s): BNP in the last 8760 hours. Basic Metabolic Panel: Recent Labs  Lab 10/18/18 1343 10/19/18 0351  NA 140 136  K 3.9 3.8  CL 106 107  CO2 27 24  GLUCOSE 98 166*  BUN 13 15  CREATININE 0.77 0.72  CALCIUM 8.8* 8.1*   Liver Function Tests: No results for input(s): AST, ALT, ALKPHOS, BILITOT, PROT, ALBUMIN in the last 168 hours. No results for input(s): LIPASE, AMYLASE in the last 168 hours. No results for input(s): AMMONIA in the last 168 hours. CBC: Recent Labs  Lab 10/18/18 1343 10/19/18 0351 10/20/18 0412  WBC 6.0 9.5 7.7  NEUTROABS 4.0  --   --   HGB 15.4 12.5* 12.3*  HCT 46.1 37.1* 36.3*  MCV 104.8* 103.3* 103.1*  PLT 120* 103* 98*   Cardiac Enzymes: No results for input(s): CKTOTAL, CKMB, CKMBINDEX, TROPONINI in the last 168 hours. BNP: Invalid input(s): POCBNP CBG: No results for input(s): GLUCAP in the last 168 hours. D-Dimer No results for input(s): DDIMER in the last 72 hours. Hgb A1c No results for input(s): HGBA1C in the last 72 hours. Lipid Profile No results for input(s): CHOL, HDL, LDLCALC, TRIG, CHOLHDL, LDLDIRECT in the last 72 hours. Thyroid function studies No results for input(s): TSH, T4TOTAL, T3FREE, THYROIDAB in the last 72 hours.  Invalid  input(s): FREET3 Anemia work up Recent Labs    10/20/18 0412  VITAMINB12 77*  FOLATE 4.0*  FERRITIN 337*  TIBC 241*  IRON 34*   Urinalysis    Component Value Date/Time   COLORURINE YELLOW 11/04/2009 1017   APPEARANCEUR CLEAR 11/04/2009 1017   LABSPEC 1.009 11/04/2009 1017   PHURINE 6.5 11/04/2009 1017   GLUCOSEU NEGATIVE 11/04/2009 1017   HGBUR NEGATIVE 11/04/2009 1017   BILIRUBINUR NEGATIVE 11/04/2009 1017   KETONESUR NEGATIVE 11/04/2009 1017   PROTEINUR NEGATIVE 11/04/2009 1017   UROBILINOGEN 1.0 11/04/2009 1017   NITRITE NEGATIVE 11/04/2009 1017   LEUKOCYTESUR  11/04/2009 1017    NEGATIVE MICROSCOPIC NOT DONE ON URINES WITH NEGATIVE PROTEIN, BLOOD, LEUKOCYTES, NITRITE, OR GLUCOSE <1000 mg/dL.    Microbiology No results found for this or any previous visit (from  the past 240 hour(s)).  Time coordinating discharge: Approximately 40 minutes  Patrecia Pour, MD  [facility] 10/20/2018, 6:55 PM Pager [phone]

## 2018-10-20 NOTE — Progress Notes (Signed)
Subjective: 2 Days Post-Op Procedure(s) (LRB): TOTAL HIP ARTHROPLASTY ANTERIOR APPROACH (Right) Patient reports pain as mild.  Most trouble with transfers but otherwise doing well.   Objective: Vital signs in last 24 hours: Temp:  [97.8 F (36.6 C)-98.6 F (37 C)] 98.6 F (37 C) (03/08 0556) Pulse Rate:  [71-76] 75 (03/08 0556) Resp:  [14-16] 14 (03/08 0556) BP: (117-160)/(52-68) 160/62 (03/08 0556) SpO2:  [94 %-98 %] 97 % (03/08 0556)  Intake/Output from previous day: 03/07 0701 - 03/08 0700 In: 1777.4 [P.O.:840; I.V.:937.4] Out: 1505 [Urine:1505] Intake/Output this shift: Total I/O In: 240 [P.O.:240] Out: 150 [Urine:150]  Recent Labs    10/18/18 1343 10/19/18 0351 10/20/18 0412  HGB 15.4 12.5* 12.3*   Recent Labs    10/19/18 0351 10/20/18 0412  WBC 9.5 7.7  RBC 3.59* 3.52*  HCT 37.1* 36.3*  PLT 103* 98*   Recent Labs    10/18/18 1343 10/19/18 0351  NA 140 136  K 3.9 3.8  CL 106 107  CO2 27 24  BUN 13 15  CREATININE 0.77 0.72  GLUCOSE 98 166*  CALCIUM 8.8* 8.1*   No results for input(s): LABPT, INR in the last 72 hours.  Right lower extremity: Dorsiflexion/Plantar flexion intact Incision: scant drainage Compartment soft   Assessment/Plan: 2 Days Post-Op Procedure(s) (LRB): TOTAL HIP ARTHROPLASTY ANTERIOR APPROACH (Right) Up with therapy  Clear for discharge from ortho standpoint.       Jerome Kelley 10/20/2018, 9:58 AM

## 2018-10-20 NOTE — Discharge Instructions (Signed)
INSTRUCTIONS AFTER JOINT REPLACEMENT   o Remove items at home which could result in a fall. This includes throw rugs or furniture in walking pathways o ICE to the affected joint every three hours while awake for 30 minutes at a time, for at least the first 3-5 days, and then as needed for pain and swelling.  Continue to use ice for pain and swelling. You may notice swelling that will progress down to the foot and ankle.  This is normal after surgery.  Elevate your leg when you are not up walking on it.   o Continue to use the breathing machine you got in the hospital (incentive spirometer) which will help keep your temperature down.  It is common for your temperature to cycle up and down following surgery, especially at night when you are not up moving around and exerting yourself.  The breathing machine keeps your lungs expanded and your temperature down.   DIET:  As you were doing prior to hospitalization, we recommend a well-balanced diet. INSTRUCTIONS AFTER JOINT REPLACEMENT   o Remove items at home which could result in a fall. This includes throw rugs or furniture in walking pathways o ICE to the affected joint every three hours while awake for 30 minutes at a time, for at least the first 3-5 days, and then as needed for pain and swelling.  Continue to use ice for pain and swelling. You may notice swelling that will progress down to the foot and ankle.  This is normal after surgery.  Elevate your leg when you are not up walking on it.   o Continue to use the breathing machine you got in the hospital (incentive spirometer) which will help keep your temperature down.  It is common for your temperature to cycle up and down following surgery, especially at night when you are not up moving around and exerting yourself.  The breathing machine keeps your lungs expanded and your temperature down.   DIET:  As you were doing prior to hospitalization, we recommend a well-balanced diet.  DRESSING / WOUND  CARE / SHOWERING  Keep the surgical dressing until follow up.  The dressing is water proof, so you can shower without any extra covering.  IF THE DRESSING FALLS OFF or the wound gets wet inside, change the dressing with sterile gauze.  Please use good hand washing techniques before changing the dressing.  Do not use any lotions or creams on the incision until instructed by your surgeon.    ACTIVITY  o Increase activity slowly as tolerated, but follow the weight bearing instructions below.   o No driving for 6 weeks or until further direction given by your physician.  You cannot drive while taking narcotics.  o No lifting or carrying greater than 10 lbs. until further directed by your surgeon. o Avoid periods of inactivity such as sitting longer than an hour when not asleep. This helps prevent blood clots.  o You may return to work once you are authorized by your doctor.     WEIGHT BEARING   Weight bearing as tolerated with assist device (walker, cane, etc) as directed, use it as long as suggested by your surgeon or therapist, typically at least 4-6 weeks.   EXERCISES  Results after joint replacement surgery are often greatly improved when you follow the exercise, range of motion and muscle strengthening exercises prescribed by your doctor. Safety measures are also important to protect the joint from further injury. Any time any of these  exercises cause you to have increased pain or swelling, decrease what you are doing until you are comfortable again and then slowly increase them. If you have problems or questions, call your caregiver or physical therapist for advice.   Rehabilitation is important following a joint replacement. After just a few days of immobilization, the muscles of the leg can become weakened and shrink (atrophy).  These exercises are designed to build up the tone and strength of the thigh and leg muscles and to improve motion. Often times heat used for twenty to thirty  minutes before working out will loosen up your tissues and help with improving the range of motion but do not use heat for the first two weeks following surgery (sometimes heat can increase post-operative swelling).   These exercises can be done on a training (exercise) mat, on the floor, on a table or on a bed. Use whatever works the best and is most comfortable for you.    Use music or television while you are exercising so that the exercises are a pleasant break in your day. This will make your life better with the exercises acting as a break in your routine that you can look forward to.   Perform all exercises about fifteen times, three times per day or as directed.  You should exercise both the operative leg and the other leg as well.  Exercises include:    Quad Sets - Tighten up the muscle on the front of the thigh (Quad) and hold for 5-10 seconds.    Straight Leg Raises - With your knee straight (if you were given a brace, keep it on), lift the leg to 60 degrees, hold for 3 seconds, and slowly lower the leg.  Perform this exercise against resistance later as your leg gets stronger.   Leg Slides: Lying on your back, slowly slide your foot toward your buttocks, bending your knee up off the floor (only go as far as is comfortable). Then slowly slide your foot back down until your leg is flat on the floor again.   Angel Wings: Lying on your back spread your legs to the side as far apart as you can without causing discomfort.   Hamstring Strength:  Lying on your back, push your heel against the floor with your leg straight by tightening up the muscles of your buttocks.  Repeat, but this time bend your knee to a comfortable angle, and push your heel against the floor.  You may put a pillow under the heel to make it more comfortable if necessary.   A rehabilitation program following joint replacement surgery can speed recovery and prevent re-injury in the future due to weakened muscles. Contact your  doctor or a physical therapist for more information on knee rehabilitation.    CONSTIPATION  Constipation is defined medically as fewer than three stools per week and severe constipation as less than one stool per week.  Even if you have a regular bowel pattern at home, your normal regimen is likely to be disrupted due to multiple reasons following surgery.  Combination of anesthesia, postoperative narcotics, change in appetite and fluid intake all can affect your bowels.   YOU MUST use at least one of the following options; they are listed in order of increasing strength to get the job done.  They are all available over the counter, and you may need to use some, POSSIBLY even all of these options:    Drink plenty of fluids (prune juice may be  helpful) and high fiber foods Colace 100 mg by mouth twice a day  Senokot for constipation as directed and as needed Dulcolax (bisacodyl), take with full glass of water  Miralax (polyethylene glycol) once or twice a day as needed.  If you have tried all these things and are unable to have a bowel movement in the first 3-4 days after surgery call either your surgeon or your primary doctor.    If you experience loose stools or diarrhea, hold the medications until you stool forms back up.  If your symptoms do not get better within 1 week or if they get worse, check with your doctor.  If you experience "the worst abdominal pain ever" or develop nausea or vomiting, please contact the office immediately for further recommendations for treatment.   ITCHING:  If you experience itching with your medications, try taking only a single pain pill, or even half a pain pill at a time.  You can also use Benadryl over the counter for itching or also to help with sleep.   TED HOSE STOCKINGS:  Use stockings on both legs until for at least 2 weeks or as directed by physician office. They may be removed at night for sleeping.  MEDICATIONS:  See your medication summary on the  After Visit Summary that nursing will review with you.  You may have some home medications which will be placed on hold until you complete the course of blood thinner medication.  It is important for you to complete the blood thinner medication as prescribed.  PRECAUTIONS:  If you experience chest pain or shortness of breath - call 911 immediately for transfer to the hospital emergency department.   If you develop a fever greater that 101 F, purulent drainage from wound, increased redness or drainage from wound, foul odor from the wound/dressing, or calf pain - CONTACT YOUR SURGEON.                                                   FOLLOW-UP APPOINTMENTS:  If you do not already have a post-op appointment, please call the office for an appointment to be seen by your surgeon.  Guidelines for how soon to be seen are listed in your After Visit Summary, but are typically between 1-4 weeks after surgery.  OTHER INSTRUCTIONS:   Knee Replacement:  Do not place pillow under knee, focus on keeping the knee straight while resting. CPM instructions: 0-90 degrees, 2 hours in the morning, 2 hours in the afternoon, and 2 hours in the evening. Place foam block, curve side up under heel at all times except when in CPM or when walking.  DO NOT modify, tear, cut, or change the foam block in any way.  MAKE SURE YOU:   Understand these instructions.   Get help right away if you are not doing well or get worse.    Thank you for letting us be a part of your medical care team.  It is a privilege we respect greatly.  We hope these instructions will help you stay on track for a fast and full recovery!   DRESSING / WOUND CARE / SHOWERING  Keep the surgical dressing until follow up.  The dressing is water proof, so you can shower without any extra covering.  IF THE DRESSING FALLS OFF or the wound gets wet inside,  change the dressing with sterile gauze.  Please use good hand washing techniques before changing the  dressing.  Do not use any lotions or creams on the incision until instructed by your surgeon.    ACTIVITY  o Increase activity slowly as tolerated, but follow the weight bearing instructions below.   o No driving for 6 weeks or until further direction given by your physician.  You cannot drive while taking narcotics.  o No lifting or carrying greater than 10 lbs. until further directed by your surgeon. o Avoid periods of inactivity such as sitting longer than an hour when not asleep. This helps prevent blood clots.  o You may return to work once you are authorized by your doctor.     WEIGHT BEARING   Weight bearing as tolerated with assist device (walker, cane, etc) as directed, use it as long as suggested by your surgeon or therapist, typically at least 4-6 weeks.   EXERCISES  Results after joint replacement surgery are often greatly improved when you follow the exercise, range of motion and muscle strengthening exercises prescribed by your doctor. Safety measures are also important to protect the joint from further injury. Any time any of these exercises cause you to have increased pain or swelling, decrease what you are doing until you are comfortable again and then slowly increase them. If you have problems or questions, call your caregiver or physical therapist for advice.   Rehabilitation is important following a joint replacement. After just a few days of immobilization, the muscles of the leg can become weakened and shrink (atrophy).  These exercises are designed to build up the tone and strength of the thigh and leg muscles and to improve motion. Often times heat used for twenty to thirty minutes before working out will loosen up your tissues and help with improving the range of motion but do not use heat for the first two weeks following surgery (sometimes heat can increase post-operative swelling).   These exercises can be done on a training (exercise) mat, on the floor, on a table  or on a bed. Use whatever works the best and is most comfortable for you.    Use music or television while you are exercising so that the exercises are a pleasant break in your day. This will make your life better with the exercises acting as a break in your routine that you can look forward to.   Perform all exercises about fifteen times, three times per day or as directed.  You should exercise both the operative leg and the other leg as well.  Exercises include:    Quad Sets - Tighten up the muscle on the front of the thigh (Quad) and hold for 5-10 seconds.    Straight Leg Raises - With your knee straight (if you were given a brace, keep it on), lift the leg to 60 degrees, hold for 3 seconds, and slowly lower the leg.  Perform this exercise against resistance later as your leg gets stronger.   Leg Slides: Lying on your back, slowly slide your foot toward your buttocks, bending your knee up off the floor (only go as far as is comfortable). Then slowly slide your foot back down until your leg is flat on the floor again.   Angel Wings: Lying on your back spread your legs to the side as far apart as you can without causing discomfort.   Hamstring Strength:  Lying on your back, push your heel against the floor with  your leg straight by tightening up the muscles of your buttocks.  Repeat, but this time bend your knee to a comfortable angle, and push your heel against the floor.  You may put a pillow under the heel to make it more comfortable if necessary.   A rehabilitation program following joint replacement surgery can speed recovery and prevent re-injury in the future due to weakened muscles. Contact your doctor or a physical therapist for more information on knee rehabilitation.    CONSTIPATION  Constipation is defined medically as fewer than three stools per week and severe constipation as less than one stool per week.  Even if you have a regular bowel pattern at home, your normal regimen is  likely to be disrupted due to multiple reasons following surgery.  Combination of anesthesia, postoperative narcotics, change in appetite and fluid intake all can affect your bowels.   YOU MUST use at least one of the following options; they are listed in order of increasing strength to get the job done.  They are all available over the counter, and you may need to use some, POSSIBLY even all of these options:    Drink plenty of fluids (prune juice may be helpful) and high fiber foods Colace 100 mg by mouth twice a day  Senokot for constipation as directed and as needed Dulcolax (bisacodyl), take with full glass of water  Miralax (polyethylene glycol) once or twice a day as needed.  If you have tried all these things and are unable to have a bowel movement in the first 3-4 days after surgery call either your surgeon or your primary doctor.    If you experience loose stools or diarrhea, hold the medications until you stool forms back up.  If your symptoms do not get better within 1 week or if they get worse, check with your doctor.  If you experience "the worst abdominal pain ever" or develop nausea or vomiting, please contact the office immediately for further recommendations for treatment.   ITCHING:  If you experience itching with your medications, try taking only a single pain pill, or even half a pain pill at a time.  You can also use Benadryl over the counter for itching or also to help with sleep.   TED HOSE STOCKINGS:  Use stockings on both legs until for at least 2 weeks or as directed by physician office. They may be removed at night for sleeping.  MEDICATIONS:  See your medication summary on the After Visit Summary that nursing will review with you.  You may have some home medications which will be placed on hold until you complete the course of blood thinner medication.  It is important for you to complete the blood thinner medication as prescribed.  PRECAUTIONS:  If you experience  chest pain or shortness of breath - call 911 immediately for transfer to the hospital emergency department.   If you develop a fever greater that 101 F, purulent drainage from wound, increased redness or drainage from wound, foul odor from the wound/dressing, or calf pain - CONTACT YOUR SURGEON.                                                   FOLLOW-UP APPOINTMENTS:  If you do not already have a post-op appointment, please call the office for an appointment to be seen  by your surgeon.  Guidelines for how soon to be seen are listed in your After Visit Summary, but are typically between 1-4 weeks after surgery.  OTHER INSTRUCTIONS:   Knee Replacement:  Do not place pillow under knee, focus on keeping the knee straight while resting. CPM instructions: 0-90 degrees, 2 hours in the morning, 2 hours in the afternoon, and 2 hours in the evening. Place foam block, curve side up under heel at all times except when in CPM or when walking.  DO NOT modify, tear, cut, or change the foam block in any way.  MAKE SURE YOU:   Understand these instructions.   Get help right away if you are not doing well or get worse.    Thank you for letting us be a part of your medical care team.  It is a privilege we respect greatly.  We hope these instructions will help you stay on track for a fast and full recovery!

## 2018-10-20 NOTE — Progress Notes (Signed)
Physical Therapy Treatment Patient Details Name: Jerome Kelley MRN: [id number] DOB: [birth year] Today's Date: 10/20/2018    History of Present Illness s/p R DA THA secondary to right hip fx    PT Comments    Pt progressing well; reviewed HEP, stairs with pt and wife; pain well controlled during PT. Continue to recommend HHPT  Follow Up Recommendations  Home health PT     Equipment Recommendations  Rolling walker with 5" wheels;3in1 (PT)    Recommendations for Other Services       Precautions / Restrictions Precautions Precautions: Fall Restrictions Weight Bearing Restrictions: No Other Position/Activity Restrictions: WBAT    Mobility  Bed Mobility               General bed mobility comments: up in chair on arrival  Transfers Overall transfer level: Needs assistance Equipment used: Rolling walker (2 wheeled) Transfers: Sit to/from Stand Sit to Stand: Min guard         General transfer comment: cues for hand placement  Ambulation/Gait Ambulation/Gait assistance: Supervision Gait Distance (Feet): 40 Feet Assistive device: Rolling walker (2 wheeled) Gait Pattern/deviations: Step-to pattern;Decreased weight shift to right;Trunk flexed Gait velocity: decr   General Gait Details: verbal cues for sequence and RW position, safety with turns   Stairs Stairs: Yes Stairs assistance: Min guard;Min assist Stair Management: No rails;Step to pattern;Backwards;With walker Number of Stairs: 3 General stair comments: cues for sequence and technique   Wheelchair Mobility    Modified Rankin (Stroke Patients Only)       Balance Overall balance assessment: Needs assistance;History of Falls Sitting-balance support: No upper extremity supported;Bilateral upper extremity supported Sitting balance-Leahy Scale: Good     Standing balance support: Bilateral upper extremity supported;During functional activity Standing balance-Leahy Scale: Poor Standing balance  comment: reliant on external support                            Cognition Arousal/Alertness: Awake/alert Behavior During Therapy: WFL for tasks assessed/performed Overall Cognitive Status: Within Functional Limits for tasks assessed                                        Exercises Total Joint Exercises Ankle Circles/Pumps: AROM;Both;10 reps Quad Sets: AROM;Both;10 reps Heel Slides: AROM;10 reps;Right Hip ABduction/ADduction: AAROM;10 reps;Right    General Comments        Pertinent Vitals/Pain Pain Assessment: Faces Faces Pain Scale: Hurts a little bit Pain Location: right hip  Pain Descriptors / Indicators: Sore;Discomfort Pain Intervention(s): Limited activity within patient's tolerance;Monitored during session    Home Living Family/patient expects to be discharged to:: Private residence Living Arrangements: Spouse/significant other Available Help at Discharge: Family Type of Home: Mobile home Home Access: Level entry   Home Layout: One level Home Equipment: Bedside commode Additional Comments: BSC delivered in room    Prior Function Level of Independence: Independent          PT Goals (current goals can now be found in the care plan section) Acute Rehab PT Goals Patient Stated Goal: home as soon as possible PT Goal Formulation: With patient Time For Goal Achievement: 10/26/18 Potential to Achieve Goals: Good Progress towards PT goals: Progressing toward goals    Frequency    Min 6X/week      PT Plan Current plan remains appropriate    Co-evaluation  AM-PAC PT "6 Clicks" Mobility   Outcome Measure  Help needed turning from your back to your side while in a flat bed without using bedrails?: A Little Help needed moving from lying on your back to sitting on the side of a flat bed without using bedrails?: A Little Help needed moving to and from a bed to a chair (including a wheelchair)?: A Little Help  needed standing up from a chair using your arms (e.g., wheelchair or bedside chair)?: A Little Help needed to walk in hospital room?: A Little Help needed climbing 3-5 steps with a railing? : A Little 6 Click Score: 18    End of Session Equipment Utilized During Treatment: Gait belt Activity Tolerance: Patient tolerated treatment well Patient left: in chair;with call bell/phone within reach;with chair alarm set;with family/visitor present Nurse Communication: Mobility status PT Visit Diagnosis: Difficulty in walking, not elsewhere classified (R26.2)     Time: 1050-1109 PT Time Calculation (min) (ACUTE ONLY): 19 min  Charges:  $Gait Training: 8-22 mins                     Kenyon Ana, PT  Pager: [phone] Acute Rehab Dept [facility]): [phone]   10/20/2018    [facility] 10/20/2018, 11:17 AM

## 2018-10-21 ENCOUNTER — Encounter (HOSPITAL_COMMUNITY): Payer: Self-pay | Admitting: Orthopaedic Surgery

## 2018-10-22 DIAGNOSIS — D62 Acute posthemorrhagic anemia: Secondary | ICD-10-CM | POA: Diagnosis not present

## 2018-10-22 DIAGNOSIS — Z7982 Long term (current) use of aspirin: Secondary | ICD-10-CM | POA: Diagnosis not present

## 2018-10-22 DIAGNOSIS — D539 Nutritional anemia, unspecified: Secondary | ICD-10-CM | POA: Diagnosis not present

## 2018-10-22 DIAGNOSIS — S72001D Fracture of unspecified part of neck of right femur, subsequent encounter for closed fracture with routine healing: Secondary | ICD-10-CM | POA: Diagnosis not present

## 2018-10-22 DIAGNOSIS — C189 Malignant neoplasm of colon, unspecified: Secondary | ICD-10-CM | POA: Diagnosis not present

## 2018-10-22 DIAGNOSIS — D630 Anemia in neoplastic disease: Secondary | ICD-10-CM | POA: Diagnosis not present

## 2018-10-22 DIAGNOSIS — Z9181 History of falling: Secondary | ICD-10-CM | POA: Diagnosis not present

## 2018-10-22 DIAGNOSIS — C3411 Malignant neoplasm of upper lobe, right bronchus or lung: Secondary | ICD-10-CM | POA: Diagnosis not present

## 2018-10-22 DIAGNOSIS — Z9049 Acquired absence of other specified parts of digestive tract: Secondary | ICD-10-CM | POA: Diagnosis not present

## 2018-10-22 DIAGNOSIS — Z902 Acquired absence of lung [part of]: Secondary | ICD-10-CM | POA: Diagnosis not present

## 2018-10-22 DIAGNOSIS — I10 Essential (primary) hypertension: Secondary | ICD-10-CM | POA: Diagnosis not present

## 2018-10-22 DIAGNOSIS — D696 Thrombocytopenia, unspecified: Secondary | ICD-10-CM | POA: Diagnosis not present

## 2018-10-22 DIAGNOSIS — Z87891 Personal history of nicotine dependence: Secondary | ICD-10-CM | POA: Diagnosis not present

## 2018-10-22 DIAGNOSIS — E538 Deficiency of other specified B group vitamins: Secondary | ICD-10-CM | POA: Diagnosis not present

## 2018-10-22 DIAGNOSIS — Z96641 Presence of right artificial hip joint: Secondary | ICD-10-CM | POA: Diagnosis not present

## 2018-10-23 DIAGNOSIS — D696 Thrombocytopenia, unspecified: Secondary | ICD-10-CM | POA: Diagnosis not present

## 2018-10-23 DIAGNOSIS — C189 Malignant neoplasm of colon, unspecified: Secondary | ICD-10-CM | POA: Diagnosis not present

## 2018-10-23 DIAGNOSIS — D539 Nutritional anemia, unspecified: Secondary | ICD-10-CM | POA: Diagnosis not present

## 2018-10-23 DIAGNOSIS — S72001D Fracture of unspecified part of neck of right femur, subsequent encounter for closed fracture with routine healing: Secondary | ICD-10-CM | POA: Diagnosis not present

## 2018-10-23 DIAGNOSIS — D62 Acute posthemorrhagic anemia: Secondary | ICD-10-CM | POA: Diagnosis not present

## 2018-10-23 DIAGNOSIS — C3411 Malignant neoplasm of upper lobe, right bronchus or lung: Secondary | ICD-10-CM | POA: Diagnosis not present

## 2018-10-24 ENCOUNTER — Telehealth (INDEPENDENT_AMBULATORY_CARE_PROVIDER_SITE_OTHER): Payer: Self-pay | Admitting: Orthopaedic Surgery

## 2018-10-24 NOTE — Telephone Encounter (Signed)
FYI-see below-

## 2018-10-24 NOTE — Telephone Encounter (Signed)
Jerome Kelley, with [name], called to let you know the OT evaluation has been completed.  Patient declined OT.  KS#[phone].  Thank you.

## 2018-10-25 DIAGNOSIS — C189 Malignant neoplasm of colon, unspecified: Secondary | ICD-10-CM | POA: Diagnosis not present

## 2018-10-25 DIAGNOSIS — D62 Acute posthemorrhagic anemia: Secondary | ICD-10-CM | POA: Diagnosis not present

## 2018-10-25 DIAGNOSIS — C3411 Malignant neoplasm of upper lobe, right bronchus or lung: Secondary | ICD-10-CM | POA: Diagnosis not present

## 2018-10-25 DIAGNOSIS — S72001D Fracture of unspecified part of neck of right femur, subsequent encounter for closed fracture with routine healing: Secondary | ICD-10-CM | POA: Diagnosis not present

## 2018-10-25 DIAGNOSIS — D696 Thrombocytopenia, unspecified: Secondary | ICD-10-CM | POA: Diagnosis not present

## 2018-10-25 DIAGNOSIS — D539 Nutritional anemia, unspecified: Secondary | ICD-10-CM | POA: Diagnosis not present

## 2018-10-29 DIAGNOSIS — D539 Nutritional anemia, unspecified: Secondary | ICD-10-CM | POA: Diagnosis not present

## 2018-10-29 DIAGNOSIS — S72001D Fracture of unspecified part of neck of right femur, subsequent encounter for closed fracture with routine healing: Secondary | ICD-10-CM | POA: Diagnosis not present

## 2018-10-29 DIAGNOSIS — C189 Malignant neoplasm of colon, unspecified: Secondary | ICD-10-CM | POA: Diagnosis not present

## 2018-10-29 DIAGNOSIS — D62 Acute posthemorrhagic anemia: Secondary | ICD-10-CM | POA: Diagnosis not present

## 2018-10-29 DIAGNOSIS — D696 Thrombocytopenia, unspecified: Secondary | ICD-10-CM | POA: Diagnosis not present

## 2018-10-29 DIAGNOSIS — C3411 Malignant neoplasm of upper lobe, right bronchus or lung: Secondary | ICD-10-CM | POA: Diagnosis not present

## 2018-10-31 DIAGNOSIS — S72001D Fracture of unspecified part of neck of right femur, subsequent encounter for closed fracture with routine healing: Secondary | ICD-10-CM | POA: Diagnosis not present

## 2018-10-31 DIAGNOSIS — D696 Thrombocytopenia, unspecified: Secondary | ICD-10-CM | POA: Diagnosis not present

## 2018-10-31 DIAGNOSIS — C189 Malignant neoplasm of colon, unspecified: Secondary | ICD-10-CM | POA: Diagnosis not present

## 2018-10-31 DIAGNOSIS — D539 Nutritional anemia, unspecified: Secondary | ICD-10-CM | POA: Diagnosis not present

## 2018-10-31 DIAGNOSIS — D62 Acute posthemorrhagic anemia: Secondary | ICD-10-CM | POA: Diagnosis not present

## 2018-10-31 DIAGNOSIS — C3411 Malignant neoplasm of upper lobe, right bronchus or lung: Secondary | ICD-10-CM | POA: Diagnosis not present

## 2018-11-03 ENCOUNTER — Other Ambulatory Visit (INDEPENDENT_AMBULATORY_CARE_PROVIDER_SITE_OTHER): Payer: Self-pay | Admitting: Physician Assistant

## 2018-11-04 NOTE — Telephone Encounter (Signed)
continue

## 2018-11-05 ENCOUNTER — Telehealth (INDEPENDENT_AMBULATORY_CARE_PROVIDER_SITE_OTHER): Payer: Self-pay

## 2018-11-05 DIAGNOSIS — D539 Nutritional anemia, unspecified: Secondary | ICD-10-CM | POA: Diagnosis not present

## 2018-11-05 DIAGNOSIS — C3411 Malignant neoplasm of upper lobe, right bronchus or lung: Secondary | ICD-10-CM | POA: Diagnosis not present

## 2018-11-05 DIAGNOSIS — D62 Acute posthemorrhagic anemia: Secondary | ICD-10-CM | POA: Diagnosis not present

## 2018-11-05 DIAGNOSIS — C189 Malignant neoplasm of colon, unspecified: Secondary | ICD-10-CM | POA: Diagnosis not present

## 2018-11-05 DIAGNOSIS — S72001D Fracture of unspecified part of neck of right femur, subsequent encounter for closed fracture with routine healing: Secondary | ICD-10-CM | POA: Diagnosis not present

## 2018-11-05 DIAGNOSIS — D696 Thrombocytopenia, unspecified: Secondary | ICD-10-CM | POA: Diagnosis not present

## 2018-11-05 NOTE — Telephone Encounter (Signed)
Called patient, answered no to all prescreening questions

## 2018-11-06 ENCOUNTER — Other Ambulatory Visit: Payer: Self-pay

## 2018-11-06 ENCOUNTER — Encounter (INDEPENDENT_AMBULATORY_CARE_PROVIDER_SITE_OTHER): Payer: Self-pay | Admitting: Orthopaedic Surgery

## 2018-11-06 ENCOUNTER — Ambulatory Visit (INDEPENDENT_AMBULATORY_CARE_PROVIDER_SITE_OTHER): Payer: Medicare Other | Admitting: Orthopaedic Surgery

## 2018-11-06 DIAGNOSIS — S72001A Fracture of unspecified part of neck of right femur, initial encounter for closed fracture: Secondary | ICD-10-CM

## 2018-11-06 DIAGNOSIS — Z96641 Presence of right artificial hip joint: Secondary | ICD-10-CM

## 2018-11-06 NOTE — Progress Notes (Signed)
The patient is here today just over 2 weeks status post a right total hip arthroplasty through direct anterior approach to treat right hip displaced femoral neck fracture.  He is ambulating with just a cane.  He is only taken an 81 mg aspirin daily which he was on this before surgery.  He is stopped all his pain medications.  He is having some trouble sleeping at night.  He has no issues with his wound he states.  On exam I took the dressing off and the staple line looks good.  Remove the staples easily in place Steri-Strips.  There is no significant seroma.  His leg lengths appear equal.  I showed him the hip x-rays from preoperative and with the model and explained what we did.  All questions were answered and addressed.  We will see him back in a month to see how he is overall but no x-rays are needed.  He can drive from my standpoint.  He can stop wearing his compressive hose as well.

## 2018-11-07 DIAGNOSIS — C189 Malignant neoplasm of colon, unspecified: Secondary | ICD-10-CM | POA: Diagnosis not present

## 2018-11-07 DIAGNOSIS — C3411 Malignant neoplasm of upper lobe, right bronchus or lung: Secondary | ICD-10-CM | POA: Diagnosis not present

## 2018-11-07 DIAGNOSIS — S72001D Fracture of unspecified part of neck of right femur, subsequent encounter for closed fracture with routine healing: Secondary | ICD-10-CM | POA: Diagnosis not present

## 2018-11-07 DIAGNOSIS — D62 Acute posthemorrhagic anemia: Secondary | ICD-10-CM | POA: Diagnosis not present

## 2018-11-07 DIAGNOSIS — D696 Thrombocytopenia, unspecified: Secondary | ICD-10-CM | POA: Diagnosis not present

## 2018-11-07 DIAGNOSIS — D539 Nutritional anemia, unspecified: Secondary | ICD-10-CM | POA: Diagnosis not present

## 2018-11-11 DIAGNOSIS — S72001D Fracture of unspecified part of neck of right femur, subsequent encounter for closed fracture with routine healing: Secondary | ICD-10-CM | POA: Diagnosis not present

## 2018-11-11 DIAGNOSIS — D539 Nutritional anemia, unspecified: Secondary | ICD-10-CM | POA: Diagnosis not present

## 2018-11-11 DIAGNOSIS — C3411 Malignant neoplasm of upper lobe, right bronchus or lung: Secondary | ICD-10-CM | POA: Diagnosis not present

## 2018-11-11 DIAGNOSIS — D696 Thrombocytopenia, unspecified: Secondary | ICD-10-CM | POA: Diagnosis not present

## 2018-11-11 DIAGNOSIS — D62 Acute posthemorrhagic anemia: Secondary | ICD-10-CM | POA: Diagnosis not present

## 2018-11-11 DIAGNOSIS — C189 Malignant neoplasm of colon, unspecified: Secondary | ICD-10-CM | POA: Diagnosis not present

## 2018-12-04 ENCOUNTER — Ambulatory Visit (INDEPENDENT_AMBULATORY_CARE_PROVIDER_SITE_OTHER): Payer: Medicare Other | Admitting: Orthopaedic Surgery

## 2018-12-05 ENCOUNTER — Ambulatory Visit (INDEPENDENT_AMBULATORY_CARE_PROVIDER_SITE_OTHER): Payer: Medicare Other | Admitting: Orthopaedic Surgery

## 2018-12-05 ENCOUNTER — Ambulatory Visit (INDEPENDENT_AMBULATORY_CARE_PROVIDER_SITE_OTHER): Payer: Medicare Other

## 2018-12-05 ENCOUNTER — Other Ambulatory Visit: Payer: Self-pay

## 2018-12-05 ENCOUNTER — Encounter (INDEPENDENT_AMBULATORY_CARE_PROVIDER_SITE_OTHER): Payer: Self-pay | Admitting: Orthopaedic Surgery

## 2018-12-05 DIAGNOSIS — Z96641 Presence of right artificial hip joint: Secondary | ICD-10-CM

## 2018-12-05 DIAGNOSIS — M25571 Pain in right ankle and joints of right foot: Secondary | ICD-10-CM

## 2018-12-05 DIAGNOSIS — M5410 Radiculopathy, site unspecified: Secondary | ICD-10-CM

## 2018-12-05 NOTE — Progress Notes (Signed)
Office Visit Note   Patient: Jerome Kelley           Date of Birth: 09-May-1941           MRN: [id number] Visit Date: 12/05/2018              Requested by: Kelton Pillar, MD [facility] [address] [place], [place] [address] PCP: Kelton Pillar, MD   Assessment & Plan: Visit Diagnoses:  1. Pain in right ankle and joints of right foot   2. Radicular leg pain   3. Status post total replacement of right hip     Plan: Is given handouts on back exercises which are reviewed with him.  If his condition worsens in regards to the radicular pain down the right leg he will call our office and we treated follow-up appointment at that time I will obtain AP and lateral views of the lumbar spine.  However if he continues to improve with time in regards to radicular pain down the right leg we will just see him back in 1 month for his right total hip.  No x-rays at that time.  Questions were encouraged and answered at length today.  Follow-Up Instructions: Return in about 4 weeks (around 01/02/2019).   Orders:  Orders Placed This Encounter  Procedures  . XR Ankle Complete Right   No orders of the defined types were placed in this encounter.     Procedures: No procedures performed   Clinical Data: No additional findings.   Subjective: Chief Complaint  Patient presents with  . Right Hip - Routine Post Op    HPI Mr. Keisler 78 year old male returns today for follow-up of his right hip arthroplasty which he underwent due to a acute fracture.  He is now 43 days postop.  States the hip is doing well.  His main complaint is his ankle pain.  He describes pain that radiates down the whole right leg is been numbness tingling but it is painful particularly over the lateral aspect of his right ankle.  He states it is a throbbing pain at times.  Again he had a mechanical fall while at the veterinarian office with his dog, resulting in his right hip fracture.  He denies any bowel or bladder  dysfunction or saddle anesthesia like symptoms.  Pain down the right leg does awaken him at night at times.  He states overall with some stretching that his pain down the right leg has improved.  Review of Systems Denies any fevers chills shortness of breath chest pain.  Objective: Vital Signs: There were no vitals taken for this visit.  Physical Exam Constitutional:      Appearance: He is not ill-appearing or diaphoretic.  Pulmonary:     Effort: Pulmonary effort is normal.  Neurological:     Mental Status: He is alert and oriented to person, place, and time.  Psychiatric:        Mood and Affect: Mood normal.     Ortho Exam Bilateral lower extremities he has 5 out of 5 strengths against resistance throughout.  Positive straight leg raise on the right negative on the left.  He has tenderness over the lower lumbar paraspinous region on the right only. Right ankle full dorsiflexion plantarflexion without pain.  He is nontender over the posterior tibial tendon and peroneal tendons.  He has no tenderness over the medial or lateral malleolus.  No tenderness over the anterior aspect of the ankle.  Calf supple nontender.  Good range of motion of the right hip without pain. Specialty Comments:  No specialty comments available.  Imaging: Xr Ankle Complete Right  Result Date: 12/05/2018 Right ankle 3 views: Talus well located within the ankle mortise.  No diastases.  No periosteal reactions or acute fracture.    PMFS History: Patient Active Problem List   Diagnosis Date Noted  . Hip fracture ([facility]) 10/18/2018  . Essential hypertension 10/18/2018  . Closed fracture of neck of right femur ([place])   . Colon cancer ([facility]) 09/18/2011  . Lung cancer, upper lobe ([facility]) 09/18/2011   Past Medical History:  Diagnosis Date  . Colon cancer ([facility]) 09/18/2011  . History of chemotherapy   . Hypertension   . Lung cancer, upper lobe ([facility]) 09/18/2011    History reviewed. No pertinent family history.  Past  Surgical History:  Procedure Laterality Date  . COLON SURGERY  2010  . LUNG SURGERY     right side   . port a cath insertion    . port a cath removed    . TOTAL HIP ARTHROPLASTY Right 10/18/2018   Procedure: TOTAL HIP ARTHROPLASTY ANTERIOR APPROACH;  Surgeon: Mcarthur Rossetti, MD;  Location: [facility];  Service: Orthopedics;  Laterality: Right;   Social History   Occupational History  . Not on file  Tobacco Use  . Smoking status: Current Every Day Smoker    Packs/day: 1.00    Years: 58.00    Pack years: 58.00    Types: Cigarettes    Start date: 09/03/1956  . Smokeless tobacco: Never Used  . Tobacco comment: does not want to quit  Substance and Sexual Activity  . Alcohol use: Yes    Frequency: Never    Comment: occas beer  . Drug use: Never  . Sexual activity: Not on file

## 2018-12-17 ENCOUNTER — Telehealth: Payer: Self-pay | Admitting: Orthopaedic Surgery

## 2018-12-17 MED ORDER — METHOCARBAMOL 500 MG PO TABS: 500.0000 mg | ORAL_TABLET | Freq: Four times a day (QID) | ORAL | 1 refills | Status: DC | PRN

## 2018-12-17 NOTE — Telephone Encounter (Signed)
Pt called asking If he can have nay methocarbamol.  Due to the fact that his ankle is hurting, and he cant sleep at night.

## 2018-12-17 NOTE — Telephone Encounter (Signed)
Please advise 

## 2018-12-18 MED ORDER — TIZANIDINE HCL 4 MG PO TABS: 4.0000 mg | ORAL_TABLET | Freq: Three times a day (TID) | ORAL | 1 refills | Status: DC | PRN

## 2018-12-18 NOTE — Telephone Encounter (Signed)
Pt called back saying he is in extreme pain and he needs a Doctor to call him because he can't sleep at night.

## 2018-12-18 NOTE — Telephone Encounter (Signed)
Patient states he actually has been taking Robaxin, he is wanting something different. States this isn't working for him

## 2019-01-01 ENCOUNTER — Encounter: Payer: Self-pay | Admitting: Orthopaedic Surgery

## 2019-01-01 ENCOUNTER — Ambulatory Visit (INDEPENDENT_AMBULATORY_CARE_PROVIDER_SITE_OTHER): Payer: Medicare Other | Admitting: Orthopaedic Surgery

## 2019-01-01 ENCOUNTER — Other Ambulatory Visit: Payer: Self-pay

## 2019-01-01 ENCOUNTER — Ambulatory Visit (INDEPENDENT_AMBULATORY_CARE_PROVIDER_SITE_OTHER): Payer: Medicare Other

## 2019-01-01 ENCOUNTER — Other Ambulatory Visit (INDEPENDENT_AMBULATORY_CARE_PROVIDER_SITE_OTHER): Payer: Self-pay

## 2019-01-01 DIAGNOSIS — M79604 Pain in right leg: Secondary | ICD-10-CM

## 2019-01-01 DIAGNOSIS — M4807 Spinal stenosis, lumbosacral region: Secondary | ICD-10-CM

## 2019-01-01 MED ORDER — OXYCODONE HCL 5 MG PO TABS: 5.0000 mg | ORAL_TABLET | Freq: Four times a day (QID) | ORAL | 0 refills | Status: DC | PRN

## 2019-01-01 NOTE — Progress Notes (Signed)
Office Visit Note   Patient: Jerome Kelley           Date of Birth: 02-08-1941           MRN: [id number] Visit Date: 01/01/2019              Requested by: Kelton Pillar, MD [facility] [address] [place], [place] [address] PCP: Kelton Pillar, MD   Assessment & Plan: Visit Diagnoses:  1. Pain in right leg     Plan: We will obtain an MRI of his lumbar spine to rule out HNP as source of his radicular pain down the right leg.  Having follow-up after the MRI to go over results discuss further treatment.  Questions were encouraged and answered at length by Dr. Ninfa Linden and myself.  Follow-Up Instructions: Return After MRI.   Orders:  Orders Placed This Encounter  Procedures  . XR Lumbar Spine 2-3 Views   Meds ordered this encounter  Medications  . oxyCODONE (OXY IR/ROXICODONE) 5 MG immediate release tablet    Sig: Take 1 tablet (5 mg total) by mouth every 6 (six) hours as needed for moderate pain (pain score 4-6).    Dispense:  30 tablet    Refill:  0      Procedures: No procedures performed   Clinical Data: No additional findings.   Subjective: Chief Complaint  Patient presents with  . Right Hip - Follow-up    HPI Mr. Santana returns today due to his right leg pain.  He states his ankle still bothering him.  He does note that he has pain that radiates from his hip down to the ankle but the ankle is the main complaint.  He tried some of the home exercise stretching and strengthening for score and despite this continues to have the pain.  He is having no bowel bladder dysfunction.  He does have some low back pain on the right side. Review of Systems Denies fevers or chills  Objective: Vital Signs: There were no vitals taken for this visit.  Physical Exam General: Well-developed well-nourished male no acute distress. Psych alert and oriented x3  Ortho Exam Bilateral ankles he has no tenderness along medial or lateral malleolus.  He is nontender over the  posterior tibial tendon and peroneal tendons bilaterally.  Exquisitely tight hamstrings bilaterally.  Positive straight leg raise on the right negative on the left.  5 out of 5 strength throughout the lower extremities against resistance. Specialty Comments:  No specialty comments available.  Imaging: Xr Lumbar Spine 2-3 Views  Result Date: 01/01/2019 Lumbar spine AP lateral views: No acute fracture.  No spondylolisthesis.  Degenerative changes with endplate spurring particularly at L3 and L4.  Decreased disc space at L5-S1.  Facet changes L4-5 and L5-S1.  Arthrosclerosis of the aorta noted.  Loss of lordotic curvature lumbar spine.    PMFS History: Patient Active Problem List   Diagnosis Date Noted  . Hip fracture ([facility]) 10/18/2018  . Essential hypertension 10/18/2018  . Closed fracture of neck of right femur ([facility])   . Colon cancer ([facility]) 09/18/2011  . Lung cancer, upper lobe ([facility]) 09/18/2011   Past Medical History:  Diagnosis Date  . Colon cancer ([facility]) 09/18/2011  . History of chemotherapy   . Hypertension   . Lung cancer, upper lobe ([facility]) 09/18/2011    No family history on file.  Past Surgical History:  Procedure Laterality Date  . COLON SURGERY  2010  . LUNG SURGERY  right side   . port a cath insertion    . port a cath removed    . TOTAL HIP ARTHROPLASTY Right 10/18/2018   Procedure: TOTAL HIP ARTHROPLASTY ANTERIOR APPROACH;  Surgeon: Mcarthur Rossetti, MD;  Location: [facility];  Service: Orthopedics;  Laterality: Right;   Social History   Occupational History  . Not on file  Tobacco Use  . Smoking status: Current Every Day Smoker    Packs/day: 1.00    Years: 58.00    Pack years: 58.00    Types: Cigarettes    Start date: 09/03/1956  . Smokeless tobacco: Never Used  . Tobacco comment: does not want to quit  Substance and Sexual Activity  . Alcohol use: Yes    Frequency: Never    Comment: occas beer  . Drug use: Never  . Sexual activity: Not on file

## 2019-01-11 ENCOUNTER — Other Ambulatory Visit: Payer: Medicare Other

## 2019-01-11 ENCOUNTER — Telehealth: Payer: Self-pay

## 2019-01-11 DIAGNOSIS — Z20822 Contact with and (suspected) exposure to covid-19: Secondary | ICD-10-CM

## 2019-01-11 NOTE — Telephone Encounter (Signed)
Phone call to pt.  Advised of potential exposure to and employee with COVID 19, at his recent appt. with [facility].  Offered to schedule for COVID 19 screening.  Appt. Given for 2:30 PM today at the [facility] testing site.  Pt. Verb. Understanding. And agreed with plan.

## 2019-01-13 LAB — NOVEL CORONAVIRUS, NAA: SARS-CoV-2, NAA: NOT DETECTED

## 2019-01-15 ENCOUNTER — Ambulatory Visit: Payer: Medicare Other | Admitting: Orthopaedic Surgery

## 2019-01-17 ENCOUNTER — Ambulatory Visit
Admission: RE | Admit: 2019-01-17 | Discharge: 2019-01-17 | Disposition: A | Discharge status: Home / Self Care | Payer: Medicare Other | Source: Ambulatory Visit | Attending: Orthopaedic Surgery | Admitting: Orthopaedic Surgery

## 2019-01-17 ENCOUNTER — Other Ambulatory Visit: Payer: Self-pay

## 2019-01-17 DIAGNOSIS — M4807 Spinal stenosis, lumbosacral region: Secondary | ICD-10-CM

## 2019-01-17 DIAGNOSIS — M48061 Spinal stenosis, lumbar region without neurogenic claudication: Secondary | ICD-10-CM | POA: Diagnosis not present

## 2019-01-17 IMAGING — MR MRI LUMBAR SPINE WITHOUT CONTRAST
4 of 5 series · 26 of 48 positions shown · non-contrast
Comparison: [DATE] lumbar spine radiographs

CLINICAL DATA: Fall with low back pain

EXAM:
MRI LUMBAR SPINE WITHOUT CONTRAST
TECHNIQUE: Multiplanar, multisequence MR imaging of the lumbar spine was
performed. No intravenous contrast was administered.

[Series 3: T2 · sagittal · 4.0mm · 0.55mm/px · 6 of 16 slices shown (1 of 2)]
[im 1/16]
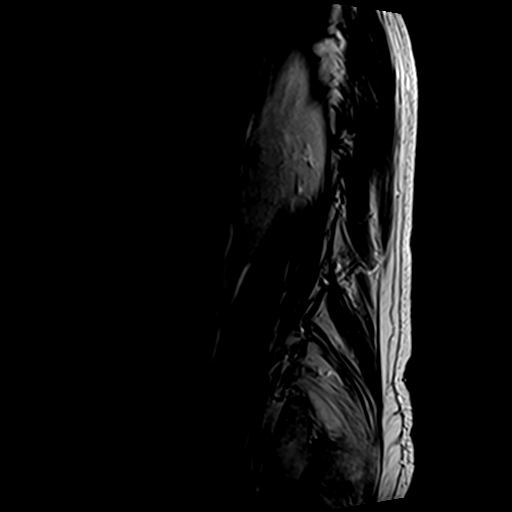
[im 4/16]
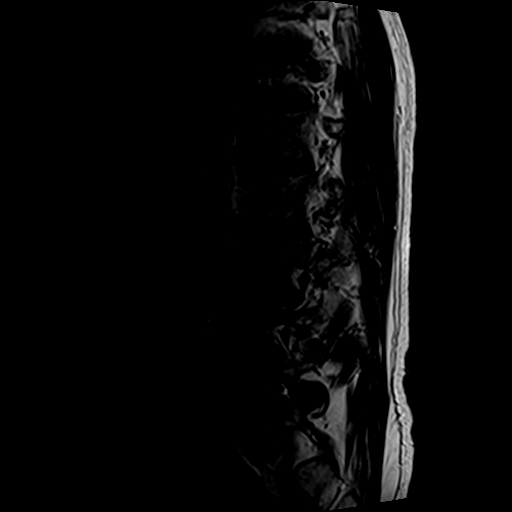
[im 7/16]
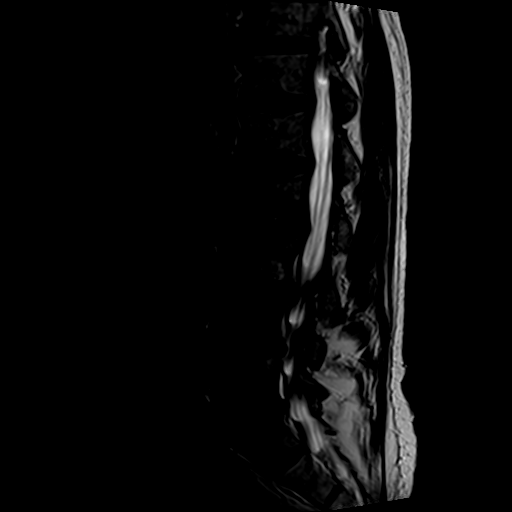
[im 10/16]
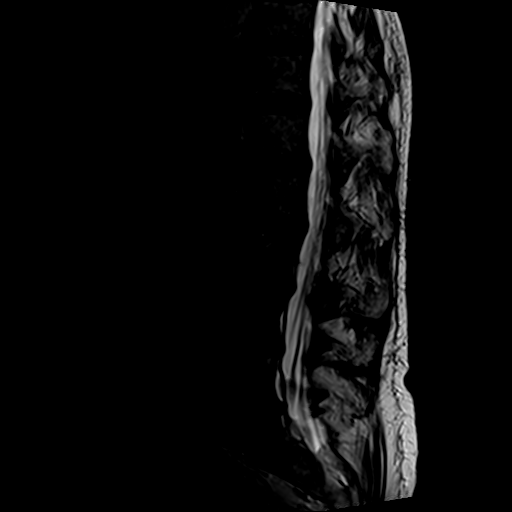
[im 13/16]
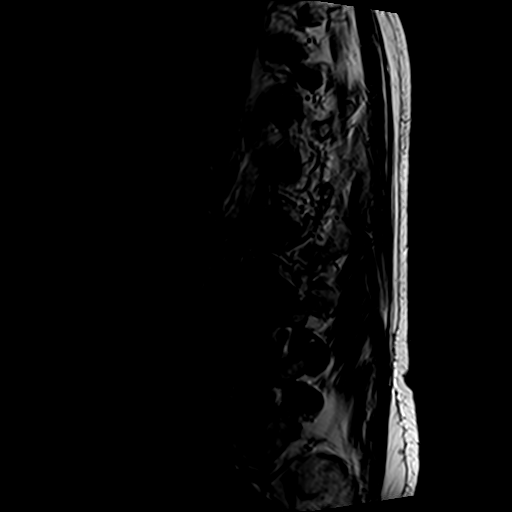
[im 16/16]
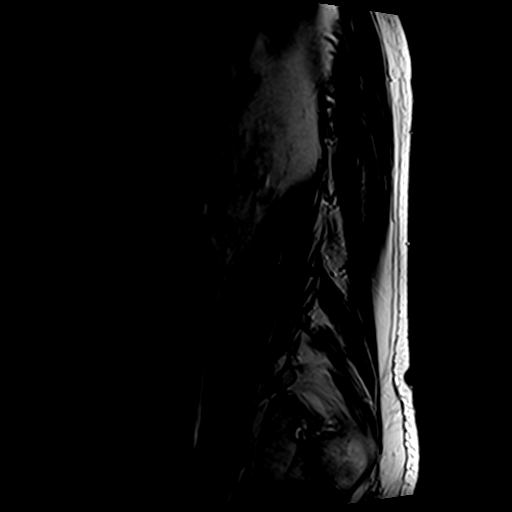

[Series 4: T1 · sagittal · 4.0mm · 0.55mm/px · 6 of 16 slices shown (1 of 2)]
[im 1/16]
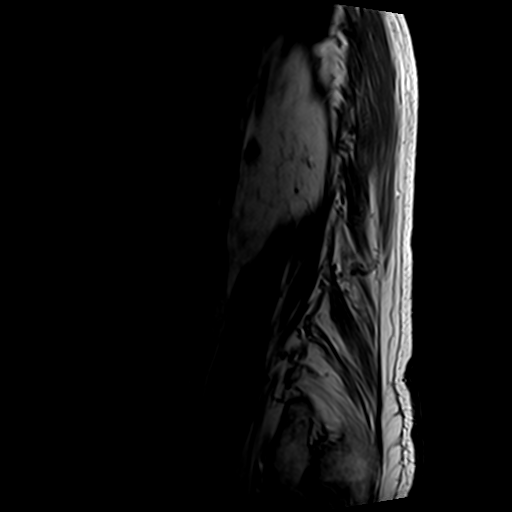
[im 4/16]
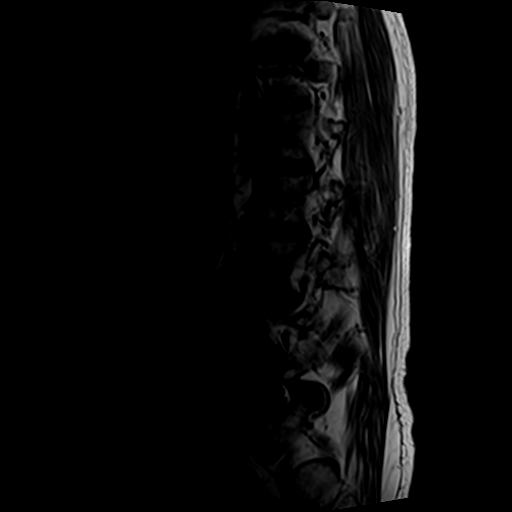
[im 7/16]
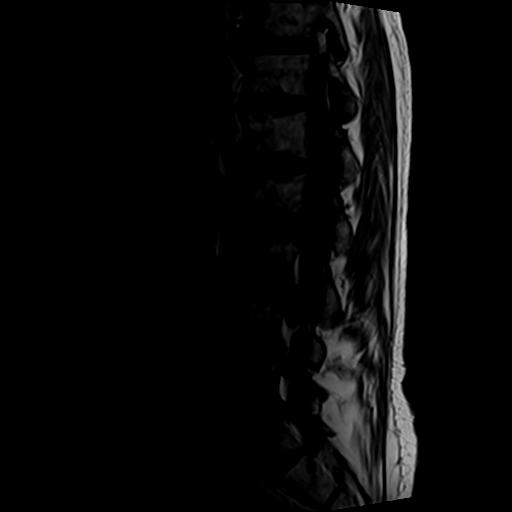
[im 10/16]
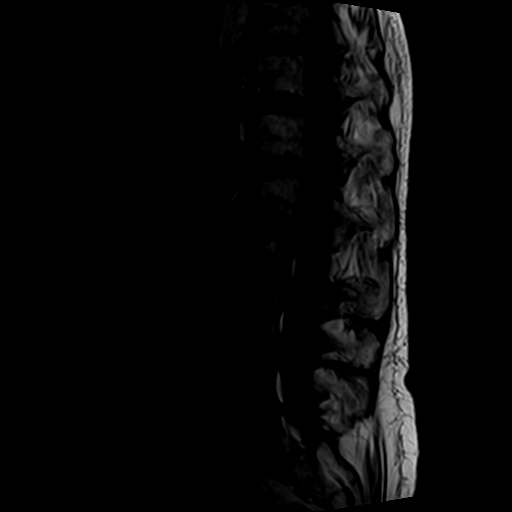
[im 13/16]
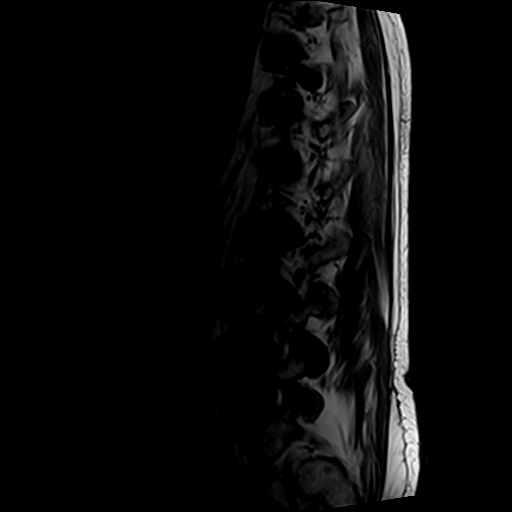
[im 16/16]
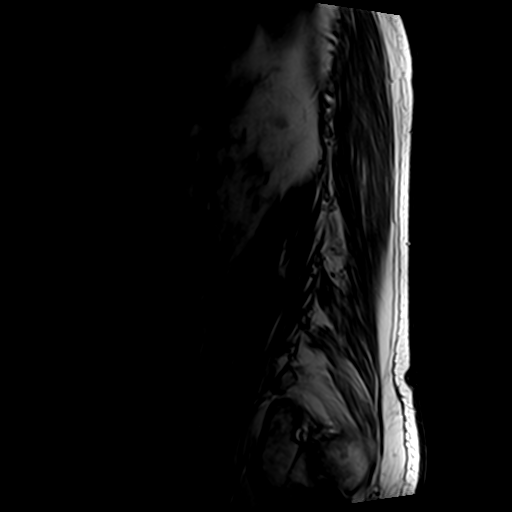

[Series 6: T2 · axial · 4.0mm · 0.70mm/px · z∈[-88,+120]mm · 9 of 39 slices shown (2 of 2)]
[im 1/39]
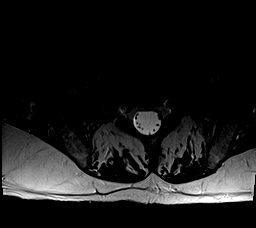
[im 6/39]
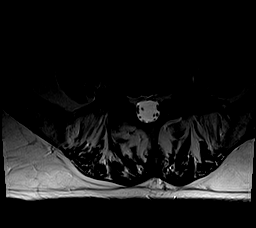
[im 11/39]
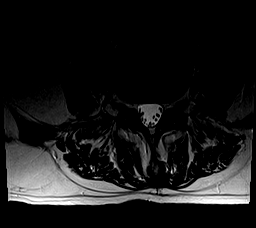
[im 17/39]
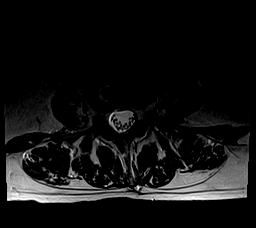
[im 20/39]
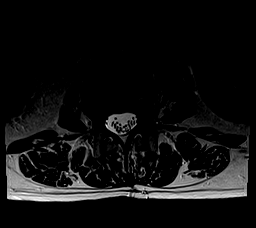
[im 22/39]
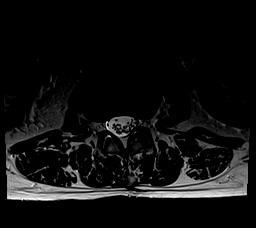
[im 28/39]
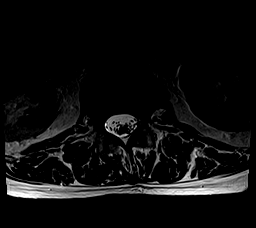
[im 33/39]
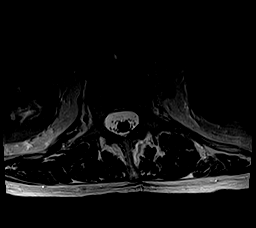
[im 39/39]
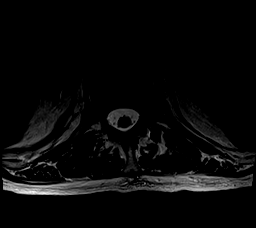

[Series 7: T1 · axial · 4.0mm · 0.35mm/px · z∈[-88,+90]mm · 5 of 39 slices shown (2 of 2)]
[im 1/39]
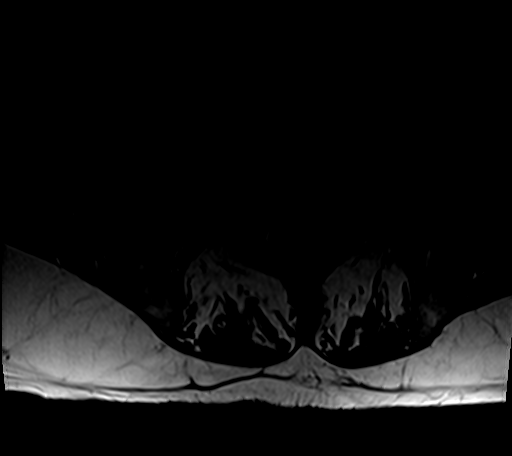
[im 6/39]
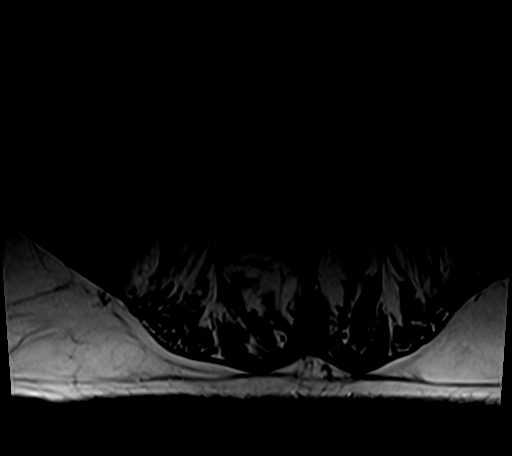
[im 11/39]
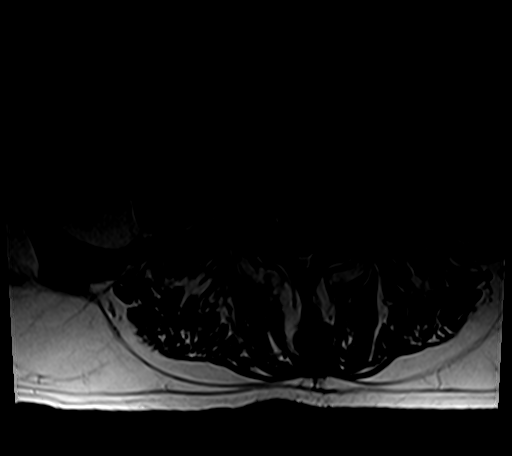
[im 20/39]
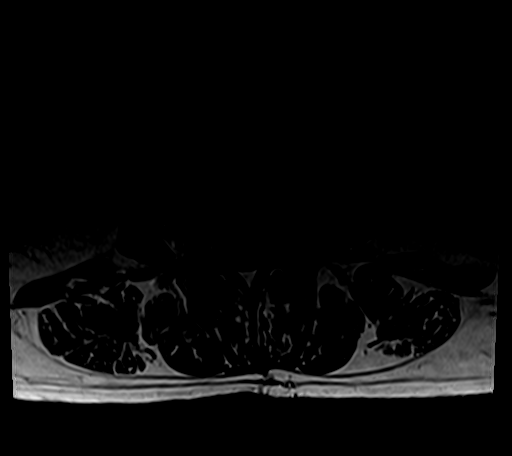
[im 33/39]
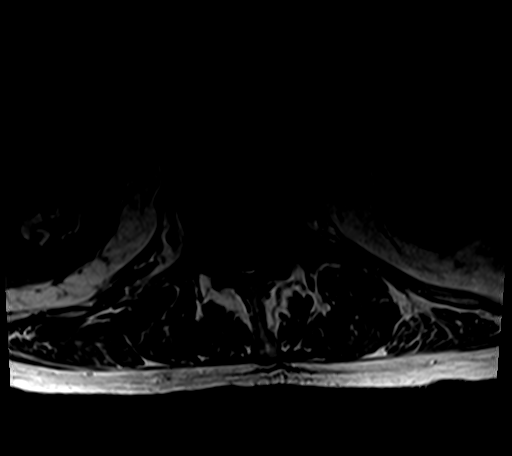

[26 of 48 positions shown; findings below may reference images not displayed]

FINDINGS: Segmentation:  Standard.

Alignment:  Physiologic.

Vertebrae:  No fracture, evidence of discitis, or bone lesion.

Conus medullaris and cauda equina: Conus extends to the L2 level.
Conus and cauda equina appear normal.

Paraspinal and other soft tissues: Negative.

Disc levels:

T11-12: Normal.

T12-L1: Normal.

L1-2: Normal.

L2-3: Disc space narrowing without spinal canal or neural foraminal
stenosis. Normal facets.

L3-4: There is disc space narrowing with mild bulge. No central
spinal canal stenosis. Mild right foraminal stenosis.

L4-5: Mild disc bulge with normal facets. No spinal canal stenosis.
Mild right foraminal stenosis.

L5-S1: Disc space narrowing with mild bulge. No spinal canal
stenosis. Moderate bilateral neural foraminal stenosis.
IMPRESSION: 1. Moderate bilateral L5-S1 neural foraminal stenosis.
2. Mild right foraminal stenosis at L3-4 and L4-5.

## 2019-01-18 ENCOUNTER — Other Ambulatory Visit: Payer: Medicare Other

## 2019-01-20 DIAGNOSIS — J449 Chronic obstructive pulmonary disease, unspecified: Secondary | ICD-10-CM | POA: Diagnosis not present

## 2019-01-20 DIAGNOSIS — I700 Atherosclerosis of aorta: Secondary | ICD-10-CM | POA: Diagnosis not present

## 2019-01-20 DIAGNOSIS — Z0000 Encounter for general adult medical examination without abnormal findings: Secondary | ICD-10-CM | POA: Diagnosis not present

## 2019-01-20 DIAGNOSIS — R972 Elevated prostate specific antigen [PSA]: Secondary | ICD-10-CM | POA: Diagnosis not present

## 2019-01-20 DIAGNOSIS — D649 Anemia, unspecified: Secondary | ICD-10-CM | POA: Diagnosis not present

## 2019-01-20 DIAGNOSIS — J439 Emphysema, unspecified: Secondary | ICD-10-CM | POA: Diagnosis not present

## 2019-01-20 DIAGNOSIS — R911 Solitary pulmonary nodule: Secondary | ICD-10-CM | POA: Diagnosis not present

## 2019-01-20 DIAGNOSIS — Z1389 Encounter for screening for other disorder: Secondary | ICD-10-CM | POA: Diagnosis not present

## 2019-01-20 DIAGNOSIS — Z85118 Personal history of other malignant neoplasm of bronchus and lung: Secondary | ICD-10-CM | POA: Diagnosis not present

## 2019-01-20 DIAGNOSIS — Z85038 Personal history of other malignant neoplasm of large intestine: Secondary | ICD-10-CM | POA: Diagnosis not present

## 2019-01-20 DIAGNOSIS — M25551 Pain in right hip: Secondary | ICD-10-CM | POA: Diagnosis not present

## 2019-01-20 DIAGNOSIS — I10 Essential (primary) hypertension: Secondary | ICD-10-CM | POA: Diagnosis not present

## 2019-01-21 ENCOUNTER — Encounter: Payer: Self-pay | Admitting: Orthopaedic Surgery

## 2019-01-21 ENCOUNTER — Other Ambulatory Visit: Payer: Self-pay

## 2019-01-21 ENCOUNTER — Ambulatory Visit (INDEPENDENT_AMBULATORY_CARE_PROVIDER_SITE_OTHER): Payer: Medicare Other | Admitting: Orthopaedic Surgery

## 2019-01-21 DIAGNOSIS — M5442 Lumbago with sciatica, left side: Secondary | ICD-10-CM

## 2019-01-21 DIAGNOSIS — I10 Essential (primary) hypertension: Secondary | ICD-10-CM | POA: Diagnosis not present

## 2019-01-21 DIAGNOSIS — M5441 Lumbago with sciatica, right side: Secondary | ICD-10-CM

## 2019-01-21 DIAGNOSIS — D649 Anemia, unspecified: Secondary | ICD-10-CM | POA: Diagnosis not present

## 2019-01-21 DIAGNOSIS — D539 Nutritional anemia, unspecified: Secondary | ICD-10-CM | POA: Diagnosis not present

## 2019-01-21 NOTE — Progress Notes (Signed)
HPI: [name] returns today to go over his MRI of his lumbar spine.  He states that his pain down the right leg actually is gotten a little bit better but continues to go down the lateral aspect of the leg into the plantar aspect of the right foot.  2 weeks ago he noted some numbness going in the left foot plantar aspect only.  Denies any other radicular symptoms involving the left lower extremity.  He also notes that he is started having some low back pain about his waist level is began since his last office visit.  Otherwise denies any changes MRI images were reviewed with the patient today.  Spinal models used for further visualization.  MRI lumbar spine dated 01/17/2019 showed moderate bilateral L5-S1 neural foraminal stenosis.  And L3-4 L4-5 mild right foraminal stenosis.  No spinal canal stenosis throughout.  Review of systems: See HPI otherwise negative  Physical exam: Positive straight leg raise on the right negative on the left.  Impression: Low back pain with radicular symptoms bilateral lower extremities.  Plan: We will send him for epidural steroid injection possible foraminal injections bilaterally L5-S1.  Follow-up in 1 month after the injections to see what type of response he had.  Questions were encouraged and answered at length.  Copy of the MRI report was given to patient.

## 2019-01-28 ENCOUNTER — Other Ambulatory Visit: Payer: Self-pay

## 2019-01-28 ENCOUNTER — Telehealth: Payer: Self-pay | Admitting: *Deleted

## 2019-01-28 DIAGNOSIS — M5441 Lumbago with sciatica, right side: Secondary | ICD-10-CM

## 2019-01-28 DIAGNOSIS — M5442 Lumbago with sciatica, left side: Secondary | ICD-10-CM

## 2019-01-28 NOTE — Telephone Encounter (Signed)
Pt called stating he was in on June 9 and Dr. Ninfa Linden had mentioned getting him a referral for ESI injection, I see in his notes he is wanting one. No order has been placed and dont know who he wanted him to go see. [name] or [facility]. Please advise. I also need order to be placed.

## 2019-01-28 NOTE — Telephone Encounter (Signed)
Put order in STAT for this to be scheduled Jerome Kelley is fine

## 2019-01-29 NOTE — Telephone Encounter (Signed)
Noted and pt was called to advise someone with Dr[facility] will call to schedule appt

## 2019-02-10 ENCOUNTER — Other Ambulatory Visit: Payer: Self-pay | Admitting: Orthopaedic Surgery

## 2019-02-10 DIAGNOSIS — G8929 Other chronic pain: Secondary | ICD-10-CM

## 2019-02-18 ENCOUNTER — Ambulatory Visit (INDEPENDENT_AMBULATORY_CARE_PROVIDER_SITE_OTHER): Payer: Medicare Other | Admitting: Orthopaedic Surgery

## 2019-02-18 ENCOUNTER — Other Ambulatory Visit: Payer: Self-pay

## 2019-02-18 DIAGNOSIS — M4807 Spinal stenosis, lumbosacral region: Secondary | ICD-10-CM

## 2019-02-19 NOTE — Progress Notes (Signed)
Appointment canceled.  Has not had ESI yet.

## 2019-02-26 ENCOUNTER — Other Ambulatory Visit: Payer: Self-pay

## 2019-02-26 ENCOUNTER — Ambulatory Visit
Admission: RE | Admit: 2019-02-26 | Discharge: 2019-02-26 | Disposition: A | Discharge status: Home / Self Care | Payer: Medicare Other | Source: Ambulatory Visit | Attending: Orthopaedic Surgery | Admitting: Orthopaedic Surgery

## 2019-02-26 ENCOUNTER — Other Ambulatory Visit: Payer: Self-pay | Admitting: Orthopaedic Surgery

## 2019-02-26 DIAGNOSIS — M549 Dorsalgia, unspecified: Secondary | ICD-10-CM | POA: Diagnosis not present

## 2019-02-26 DIAGNOSIS — M4807 Spinal stenosis, lumbosacral region: Secondary | ICD-10-CM | POA: Diagnosis not present

## 2019-02-26 DIAGNOSIS — G8929 Other chronic pain: Secondary | ICD-10-CM

## 2019-02-26 DIAGNOSIS — M5442 Lumbago with sciatica, left side: Secondary | ICD-10-CM

## 2019-02-26 IMAGING — XA EPIDURAL/NERVE ROOT
2 series · 2 of 2 positions shown · non-contrast
Comparison: none

CLINICAL DATA: Lumbosacral spondylosis without myelopathy with
radiculopathy. Chronic back pain radiating down the outside of the
right leg to the ankle in an L5 distribution. Left foot numbness.
Moderate L5-S1 neuroforaminal stenosis.

[Series 2: ortho standard · 1 of 1 slices shown (1 of 2)]
[im 1/1]
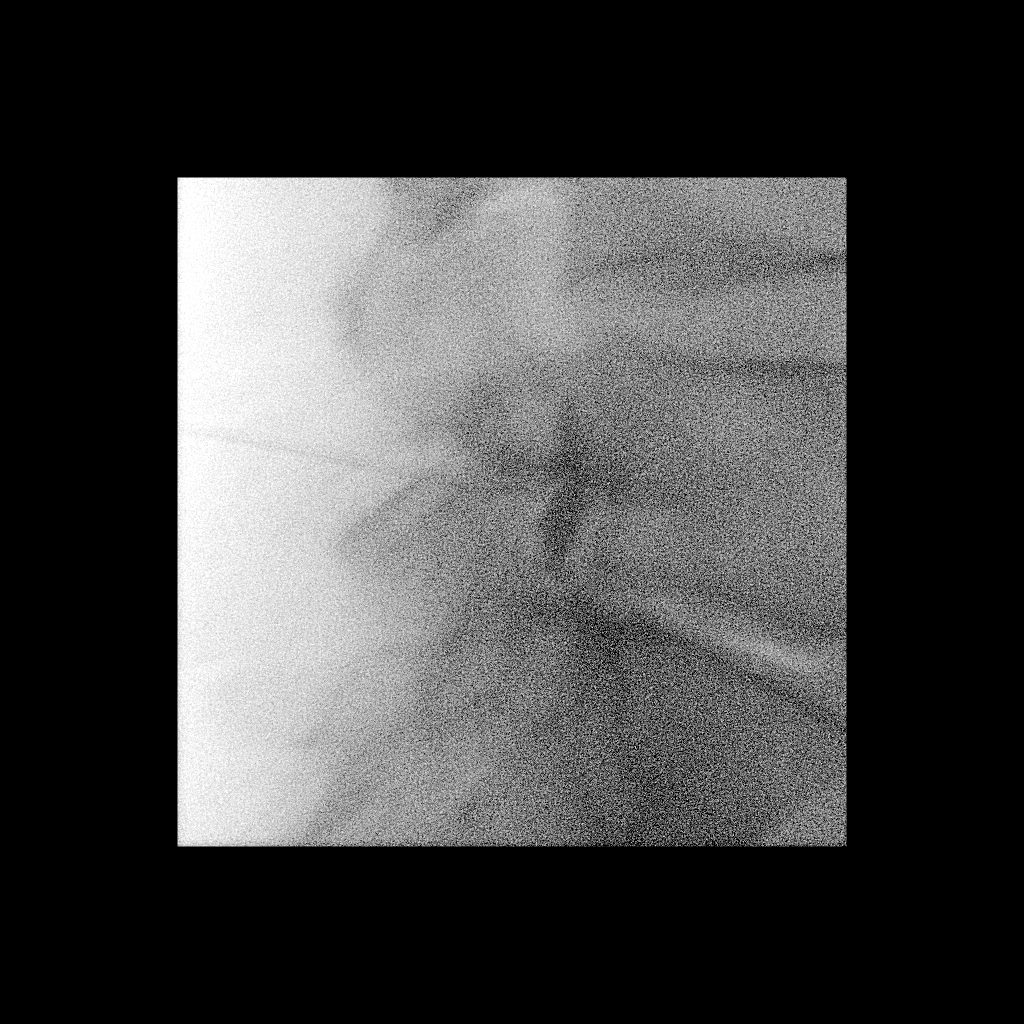

[Series 3: ortho standard · 1 of 1 slices shown (2 of 2)]
[im 1/1]
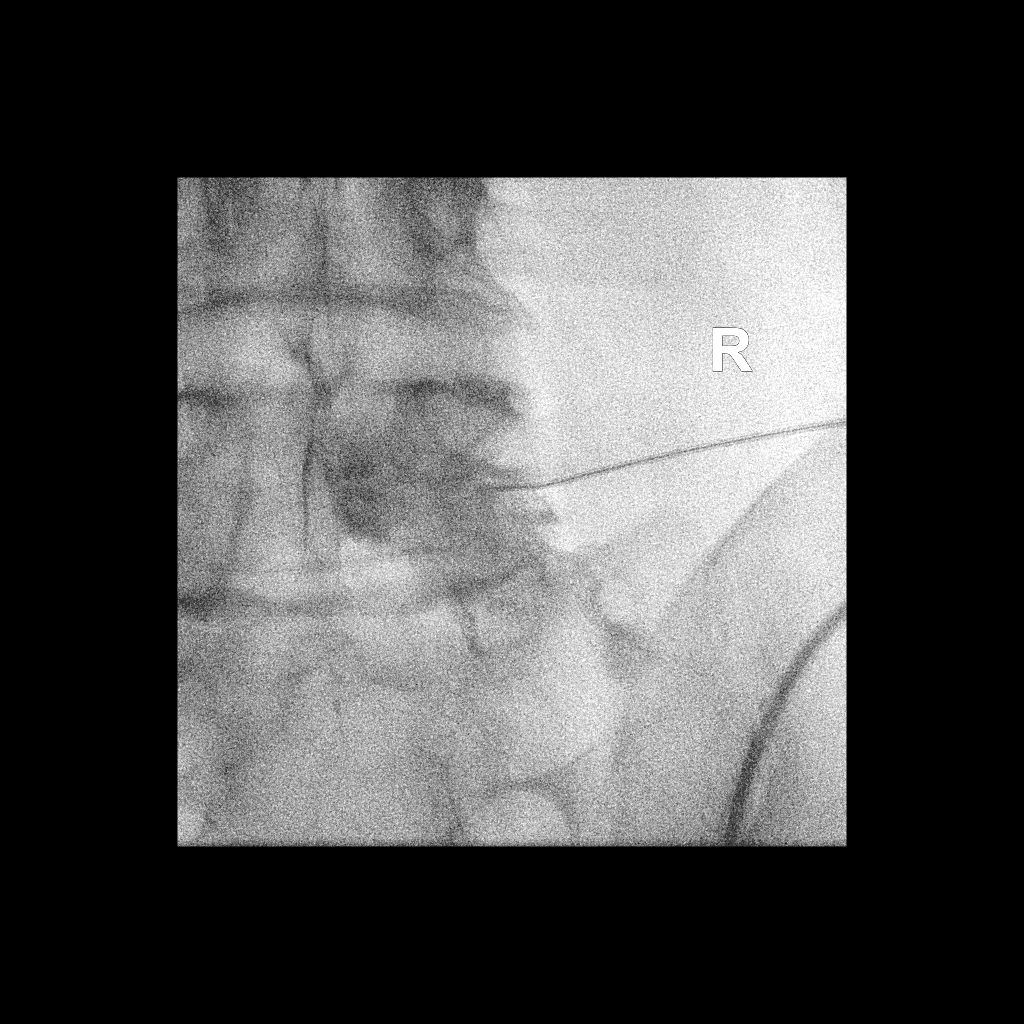

[2 of 2 positions shown; findings below may reference images not displayed]

EXAM:
EPIDURAL/NERVE ROOT

FLUOROSCOPY TIME:  Radiation Exposure Index (as provided by the
fluoroscopic device): 2.1 mGy

Fluoroscopy Time:  3 seconds

Number of Acquired Images:  0

PROCEDURE:
The procedure, risks, benefits, and alternatives were explained to
the patient. Questions regarding the procedure were encouraged and
answered. The patient understands and consents to the procedure.

RIGHT L5 NERVE ROOT BLOCK AND TRANSFORAMINAL EPIDURAL: A posterior
oblique approach was taken to the intervertebral foramen on the
right at L5-S1 using a curved 3.5 inch 22 gauge spinal needle.
Injection of Isovue-M 200 outlined the right L5 nerve root and
showed good epidural spread. No vascular opacification is seen. 120
mg of Depo-Medrol mixed with 2 mL of 1% lidocaine were instilled.
The procedure was well-tolerated, and the patient was discharged
thirty minutes following the injection in good condition.

COMPLICATIONS:
None immediate.
IMPRESSION: Technically successful injection consisting of a right L5 nerve root
block and transforaminal epidural.

## 2019-02-26 MED ORDER — METHYLPREDNISOLONE ACETATE 40 MG/ML INJ SUSP (RADIOLOG
120.0000 mg | Freq: Once | INTRAMUSCULAR | Status: AC
  Administered 2019-02-26: 120 mg via EPIDURAL

## 2019-02-26 MED ORDER — IOPAMIDOL (ISOVUE-M 200) INJECTION 41%
1.0000 mL | Freq: Once | INTRAMUSCULAR | Status: AC
  Administered 2019-02-26: 1 mL via EPIDURAL

## 2019-02-26 NOTE — Discharge Instructions (Signed)

## 2019-03-26 ENCOUNTER — Ambulatory Visit (INDEPENDENT_AMBULATORY_CARE_PROVIDER_SITE_OTHER): Payer: Medicare Other | Admitting: Orthopaedic Surgery

## 2019-03-26 ENCOUNTER — Encounter: Payer: Self-pay | Admitting: Orthopaedic Surgery

## 2019-03-26 DIAGNOSIS — M5431 Sciatica, right side: Secondary | ICD-10-CM | POA: Diagnosis not present

## 2019-03-26 DIAGNOSIS — M4807 Spinal stenosis, lumbosacral region: Secondary | ICD-10-CM | POA: Diagnosis not present

## 2019-03-26 NOTE — Progress Notes (Signed)
The patient is here today in follow-up after having a right-sided L5 injection at [facility] in his lumbar spine.  He says he is doing much better overall.  He is an active 78 year old gentleman.  He says he is much better than he was.  He is still little stiff in the morning but is denying the significant radicular symptoms that he had before.  He gets out of a chair easily and up on the exam table.  He has a negative straight leg raise on the right side.  He is got good strength in his bilateral lower extremities.  A long thorough discussion about his lumbar spine.  I would definitely recommend a repeat injection if his symptoms flareup again.  Right now he is doing well enough that he can follow-up as needed.  I told him to call us if he would like to have another injection set up at [facility].  All question concerns were answered and addressed.

## 2019-06-07 DIAGNOSIS — Z23 Encounter for immunization: Secondary | ICD-10-CM | POA: Diagnosis not present

## 2019-07-22 DIAGNOSIS — I10 Essential (primary) hypertension: Secondary | ICD-10-CM | POA: Diagnosis not present

## 2019-11-01 DIAGNOSIS — Z23 Encounter for immunization: Secondary | ICD-10-CM | POA: Diagnosis not present

## 2019-11-08 ENCOUNTER — Ambulatory Visit: Payer: Medicare Other

## 2019-11-29 DIAGNOSIS — Z23 Encounter for immunization: Secondary | ICD-10-CM | POA: Diagnosis not present

## 2019-12-08 DIAGNOSIS — H52203 Unspecified astigmatism, bilateral: Secondary | ICD-10-CM | POA: Diagnosis not present

## 2019-12-08 DIAGNOSIS — Z961 Presence of intraocular lens: Secondary | ICD-10-CM | POA: Diagnosis not present

## 2019-12-08 DIAGNOSIS — H18513 Endothelial corneal dystrophy, bilateral: Secondary | ICD-10-CM | POA: Diagnosis not present

## 2020-01-21 ENCOUNTER — Other Ambulatory Visit: Payer: Self-pay | Admitting: Family Medicine

## 2020-01-21 DIAGNOSIS — R911 Solitary pulmonary nodule: Secondary | ICD-10-CM | POA: Diagnosis not present

## 2020-01-21 DIAGNOSIS — Z85118 Personal history of other malignant neoplasm of bronchus and lung: Secondary | ICD-10-CM | POA: Diagnosis not present

## 2020-01-21 DIAGNOSIS — Z0000 Encounter for general adult medical examination without abnormal findings: Secondary | ICD-10-CM | POA: Diagnosis not present

## 2020-01-21 DIAGNOSIS — J449 Chronic obstructive pulmonary disease, unspecified: Secondary | ICD-10-CM | POA: Diagnosis not present

## 2020-01-21 DIAGNOSIS — R972 Elevated prostate specific antigen [PSA]: Secondary | ICD-10-CM | POA: Diagnosis not present

## 2020-01-21 DIAGNOSIS — I700 Atherosclerosis of aorta: Secondary | ICD-10-CM | POA: Diagnosis not present

## 2020-01-21 DIAGNOSIS — F17210 Nicotine dependence, cigarettes, uncomplicated: Secondary | ICD-10-CM | POA: Diagnosis not present

## 2020-01-21 DIAGNOSIS — Z1389 Encounter for screening for other disorder: Secondary | ICD-10-CM | POA: Diagnosis not present

## 2020-01-21 DIAGNOSIS — I10 Essential (primary) hypertension: Secondary | ICD-10-CM | POA: Diagnosis not present

## 2020-01-21 DIAGNOSIS — Z85038 Personal history of other malignant neoplasm of large intestine: Secondary | ICD-10-CM | POA: Diagnosis not present

## 2020-01-21 DIAGNOSIS — E538 Deficiency of other specified B group vitamins: Secondary | ICD-10-CM | POA: Diagnosis not present

## 2020-02-11 ENCOUNTER — Other Ambulatory Visit: Payer: Self-pay

## 2020-02-11 ENCOUNTER — Ambulatory Visit
Admission: RE | Admit: 2020-02-11 | Discharge: 2020-02-11 | Disposition: A | Discharge status: Home / Self Care | Payer: Medicare Other | Source: Ambulatory Visit | Attending: Family Medicine | Admitting: Family Medicine

## 2020-02-11 DIAGNOSIS — R911 Solitary pulmonary nodule: Secondary | ICD-10-CM

## 2020-02-11 IMAGING — CT CT CHEST W/ CM
1 series · 14 of 34 positions shown, 18 images · IV contrast (APPLIED)
Comparison: [DATE]

CLINICAL DATA: History of prior right lower lobe lung cancer status
post lobectomy, pulmonary nodule

EXAM:
CT CHEST WITH CONTRAST
TECHNIQUE: Multidetector CT imaging of the chest was performed during
intravenous contrast administration.
CONTRAST:  75mL [3O] IOPAMIDOL ([3O]) INJECTION 61%

[Series 2: chest w/cm · axial · 0.68mm/px · z∈[-37,+231]mm · 14 of 158 slices shown, 18 images]
[im 12/158  mediastinal]
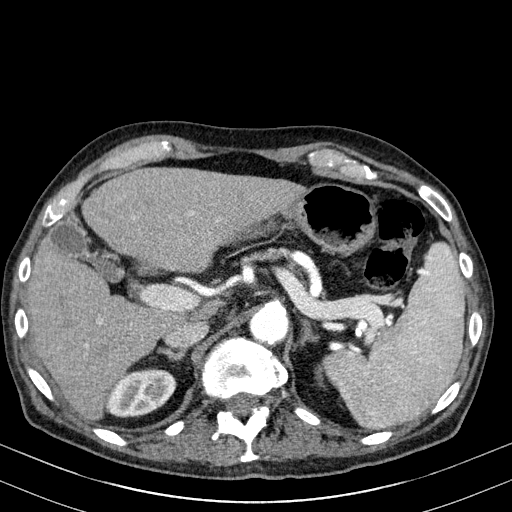
[im 12/158  lung]
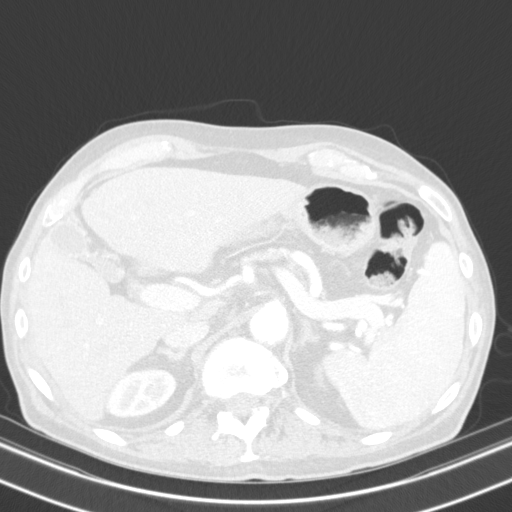
[im 24/158  lung]
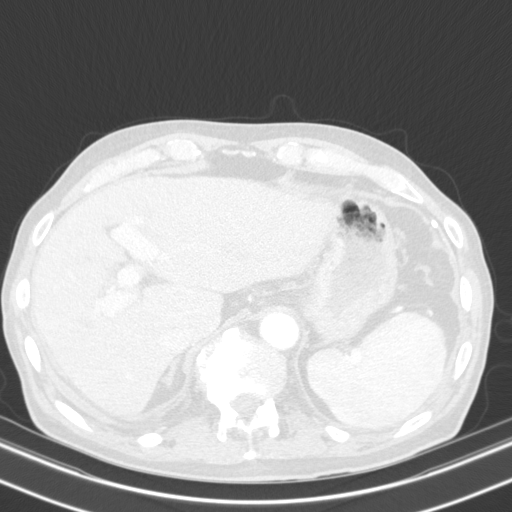
[im 32/158  lung]
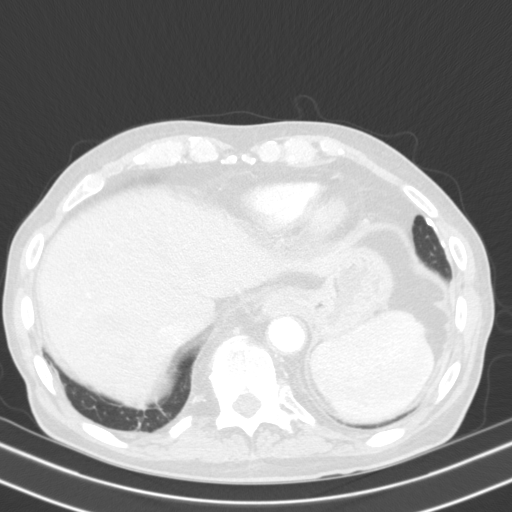
[im 47/158  lung]
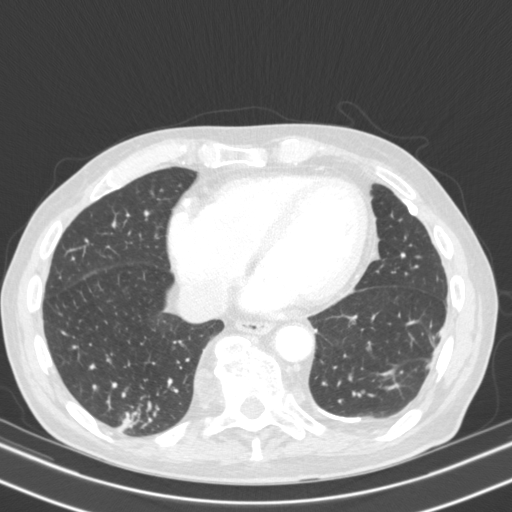
[im 59/158  mediastinal]
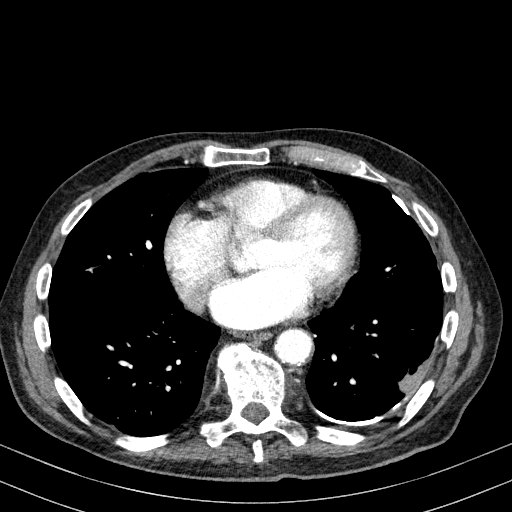
[im 59/158  lung]
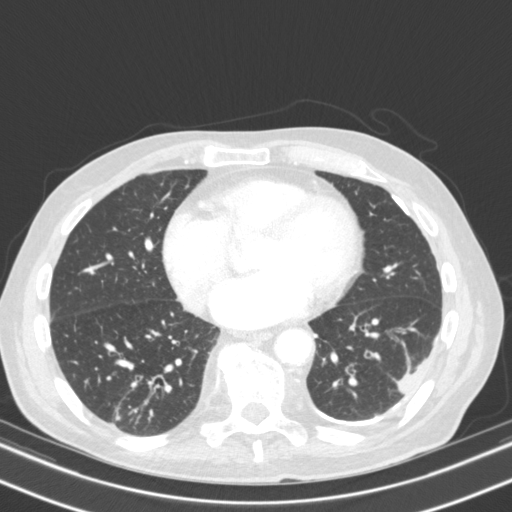
[im 64/158  lung]
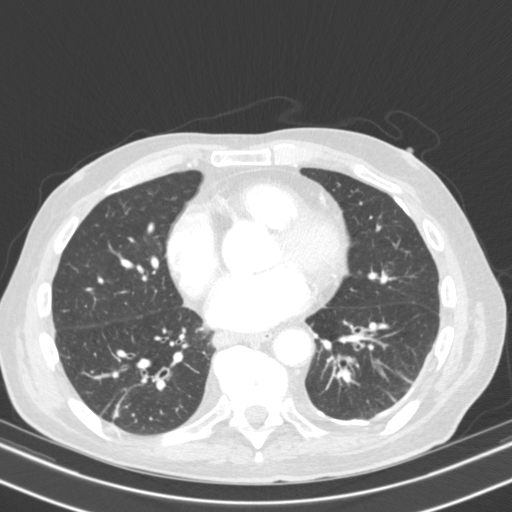
[im 75/158  lung]
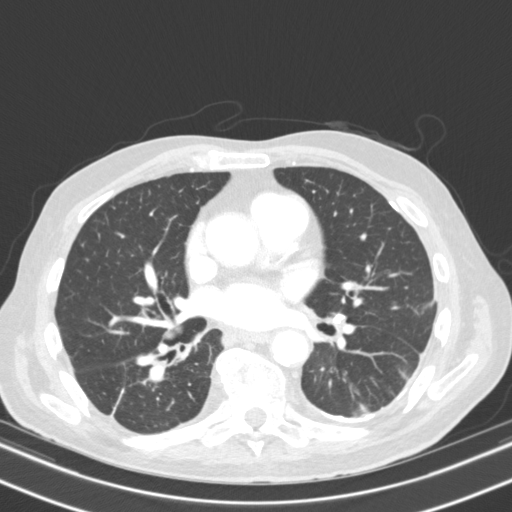
[im 84/158  lung]
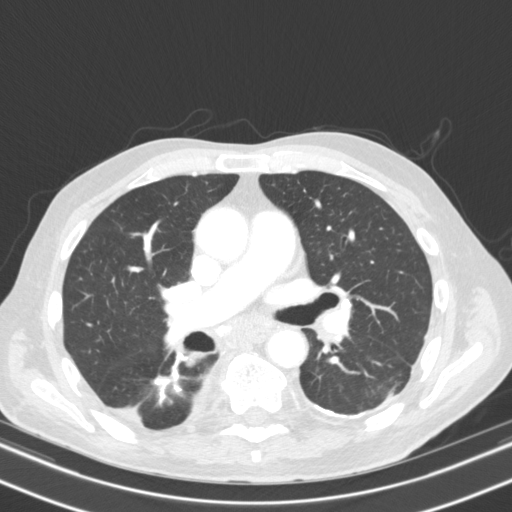
[im 94/158  mediastinal]
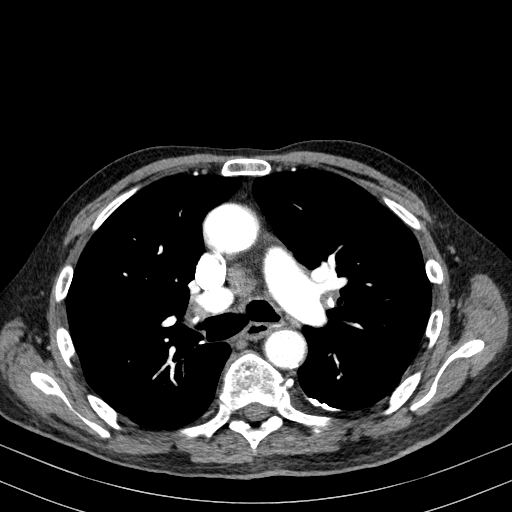
[im 94/158  lung]
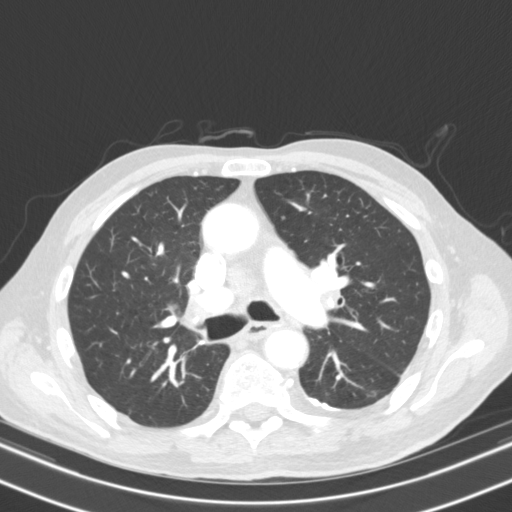
[im 99/158  lung]
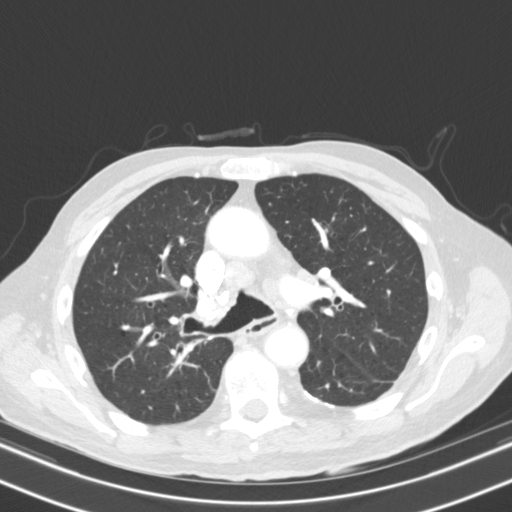
[im 117/158  lung]
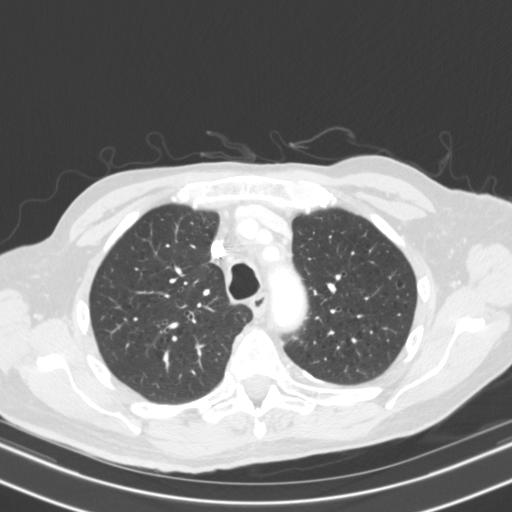
[im 126/158  lung]
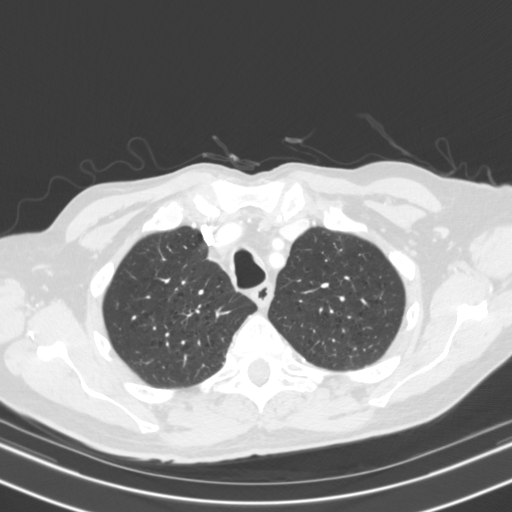
[im 134/158  mediastinal]
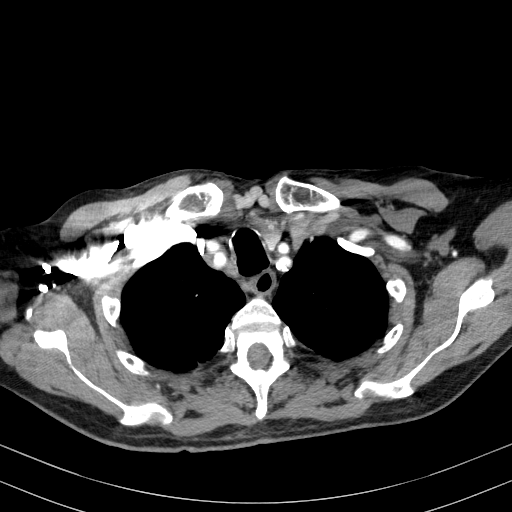
[im 134/158  lung]
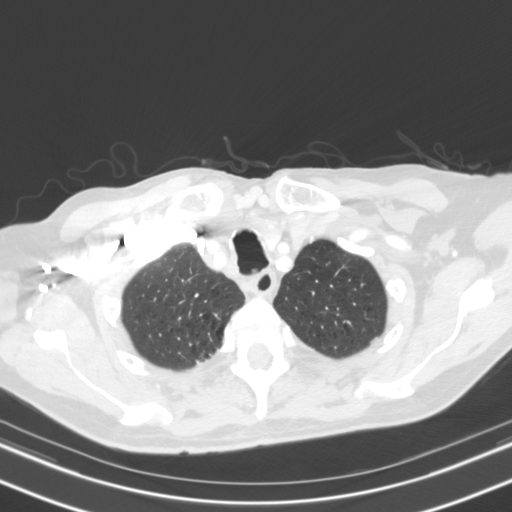
[im 146/158  lung]
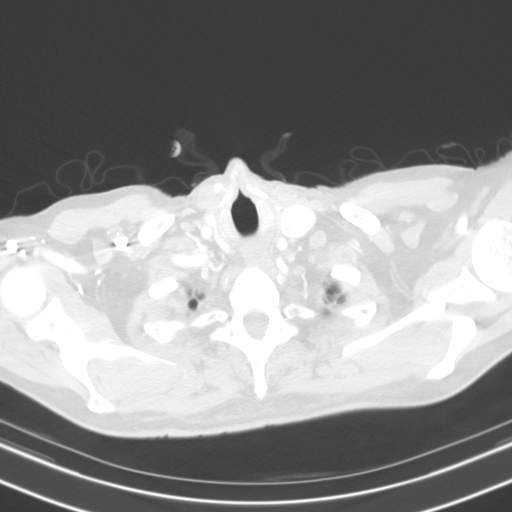

[14 of 34 positions shown; findings below may reference images not displayed]

FINDINGS: Cardiovascular: The heart is unremarkable without pericardial
effusion. Irregular atherosclerosis of the aortic arch unchanged. No
evidence of aneurysm or dissection.

Mediastinum/Nodes: Numerous calcified mediastinal lymph nodes are
consistent with previous granulomatous disease. Since the prior
exam, 2 lymph nodes have enlarged, measuring 16 mm in short axis in
the precarinal station and measuring 16 mm in short axis in the
subcarinal station. Previously these had measured 11 mm and 10 mm
respectively.

The thyroid, trachea, and esophagus are unremarkable.

Lungs/Pleura: Continued upper lobe predominant emphysema.
Postsurgical changes are seen from partial right lower lobe
resection. Areas of scarring are seen at the lung bases. No acute
airspace disease, effusion, or pneumothorax. Pleural calcifications
are seen on the left.

The right upper lobe nodule seen previously has increased in size
measuring 8 x 5 mm reference image 90/3, previously measuring 5 x 4
mm by my measurement image 87/8 [DATE]. The nodule demonstrates
spiculated margins. PET-CT is recommended for further evaluation.

No other pulmonary nodules or masses.  Central airways are patent.

Upper Abdomen: Stable findings of cirrhosis and portal venous
hypertension. No acute findings.

Musculoskeletal: No acute or destructive bony lesions. Reconstructed
images demonstrate no additional findings.
IMPRESSION: 1. Interval increase in size of the right upper lobe pulmonary
nodule, now measuring 8 x 5 mm. PET-CT is recommended for further
evaluation.
2. Stable postsurgical changes from partial right lower lobectomy.
3. Mediastinal lymphadenopathy as above. This could also be
evaluated by PET scan.
4. Aortic Atherosclerosis ([3O]-[3O]) and Emphysema ([3O]-[3O]).
5. Stable cirrhosis and portal venous hypertension.

These results will be called to the ordering clinician or
representative by the Radiologist Assistant, and communication
documented in the PACS or [REDACTED].

## 2020-02-11 MED ORDER — IOPAMIDOL (ISOVUE-300) INJECTION 61%
75.0000 mL | Freq: Once | INTRAVENOUS | Status: AC | PRN
  Administered 2020-02-11: 75 mL via INTRAVENOUS

## 2020-02-13 ENCOUNTER — Other Ambulatory Visit (HOSPITAL_COMMUNITY): Payer: Self-pay | Admitting: Family Medicine

## 2020-02-13 ENCOUNTER — Other Ambulatory Visit: Payer: Self-pay | Admitting: Family Medicine

## 2020-02-13 DIAGNOSIS — IMO0001 Reserved for inherently not codable concepts without codable children: Secondary | ICD-10-CM

## 2020-02-13 DIAGNOSIS — R911 Solitary pulmonary nodule: Secondary | ICD-10-CM

## 2020-02-13 DIAGNOSIS — Z85118 Personal history of other malignant neoplasm of bronchus and lung: Secondary | ICD-10-CM

## 2020-02-23 ENCOUNTER — Other Ambulatory Visit: Payer: Self-pay

## 2020-02-23 ENCOUNTER — Ambulatory Visit (HOSPITAL_COMMUNITY)
Admission: RE | Admit: 2020-02-23 | Discharge: 2020-02-23 | Disposition: A | Discharge status: Home / Self Care | Payer: Medicare Other | Source: Ambulatory Visit | Attending: Family Medicine | Admitting: Family Medicine

## 2020-02-23 DIAGNOSIS — I700 Atherosclerosis of aorta: Secondary | ICD-10-CM | POA: Diagnosis not present

## 2020-02-23 DIAGNOSIS — K5730 Diverticulosis of large intestine without perforation or abscess without bleeding: Secondary | ICD-10-CM | POA: Insufficient documentation

## 2020-02-23 DIAGNOSIS — R911 Solitary pulmonary nodule: Secondary | ICD-10-CM | POA: Diagnosis not present

## 2020-02-23 DIAGNOSIS — N400 Enlarged prostate without lower urinary tract symptoms: Secondary | ICD-10-CM | POA: Insufficient documentation

## 2020-02-23 DIAGNOSIS — I2510 Atherosclerotic heart disease of native coronary artery without angina pectoris: Secondary | ICD-10-CM | POA: Insufficient documentation

## 2020-02-23 DIAGNOSIS — IMO0001 Reserved for inherently not codable concepts without codable children: Secondary | ICD-10-CM

## 2020-02-23 DIAGNOSIS — J439 Emphysema, unspecified: Secondary | ICD-10-CM | POA: Diagnosis not present

## 2020-02-23 DIAGNOSIS — Z85118 Personal history of other malignant neoplasm of bronchus and lung: Secondary | ICD-10-CM

## 2020-02-23 DIAGNOSIS — Z85038 Personal history of other malignant neoplasm of large intestine: Secondary | ICD-10-CM | POA: Diagnosis not present

## 2020-02-23 LAB — GLUCOSE, CAPILLARY: Glucose-Capillary: 90 mg/dL (ref 70–99)

## 2020-02-23 IMAGING — PT NM PET TUM IMG INITIAL (PI) SKULL BASE T - THIGH
1 of 7 series · 1 of 25 positions shown · non-contrast
Comparison: [DATE] chest CT.

CLINICAL DATA: Initial treatment strategy for enlarging right
middle lobe pulmonary nodule. History right lower lobe wedge
resection for lung cancer. History of colon cancer.

EXAM:
NUCLEAR MEDICINE PET SKULL BASE TO THIGH
TECHNIQUE: 9.7 mCi F-18 FDG was injected intravenously. Full-ring PET imaging
was performed from the skull base to thigh after the radiotracer. CT
data was obtained and used for attenuation correction and anatomic
localization.
Fasting blood glucose: 90 mg/dl

[Series 4: ct sk_thigh 5.0 b31f · axial · 5.0mm · 0.98mm/px · 1 of 242 slices shown]
[im 242/242  brain]
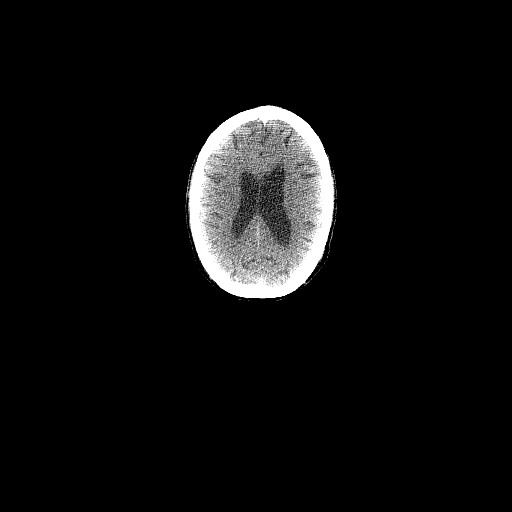

[1 of 25 positions shown; findings below may reference images not displayed]

FINDINGS: Mediastinal blood pool activity: SUV max

Liver activity: SUV max NA

NECK: A few small hypermetabolic right supraclavicular lymph nodes,
for example measuring 0.7 cm with max SUV 14.1 (series 4/image 55).
No hypermetabolic left neck lymph nodes.

Incidental CT findings: none

CHEST:

Hypermetabolic 0.9 cm solid right middle lobe pulmonary nodule with
max SUV 4.6 (series 8/image 48). No additional hypermetabolic
pulmonary findings.

Hypermetabolic right hilar adenopathy with max SUV 10.3.

Multiple hypermetabolic right paratracheal and subcarinal lymph
nodes. Representative 1.5 cm subcarinal node with max SUV
(series 4/image 86). Representative 1.6 cm lower right paratracheal
node with max SUV 23.2 (series 4/image 78).

Incidental CT findings: Coronary atherosclerosis. Atherosclerotic
nonaneurysmal thoracic aorta. Calcified left-sided pleural plaques.
Moderate centrilobular emphysema. Postsurgical changes from right
lower lobe wedge resection. No additional significant pulmonary
nodules.

ABDOMEN/PELVIS: No abnormal hypermetabolic activity within the
liver, pancreas, adrenal glands, or spleen. No hypermetabolic lymph
nodes in the abdomen or pelvis.

Incidental CT findings: Postsurgical changes from subtotal right
hemicolectomy with intact appearing right-sided ileocolic
anastomosis. Marked sigmoid diverticulosis. Moderate prostatomegaly.
Atherosclerotic nonaneurysmal abdominal aorta.

SKELETON: No focal hypermetabolic activity to suggest skeletal
metastasis.

Incidental CT findings: Right total hip arthroplasty.
IMPRESSION: 1. Hypermetabolic 0.9 cm solid right middle lobe pulmonary nodule,
compatible with malignancy, presumably a metachronous primary
bronchogenic carcinoma.
2. Intensely hypermetabolic right hilar, subcarinal, right
paratracheal and right supraclavicular adenopathy compatible with
metastatic disease.
3. No hypermetabolic metastatic disease in the abdomen, pelvis or
skeleton.
4. Chronic findings include: Aortic Atherosclerosis ([4I]-[4I])
and Emphysema ([4I]-[4I]). Coronary atherosclerosis. Marked
sigmoid diverticulosis. Moderate prostatomegaly.

## 2020-02-23 MED ORDER — FLUDEOXYGLUCOSE F - 18 (FDG) INJECTION
9.7000 | Freq: Once | INTRAVENOUS | Status: AC | PRN
  Administered 2020-02-23: 9.7 via INTRAVENOUS

## 2020-03-02 ENCOUNTER — Encounter: Payer: Self-pay | Admitting: *Deleted

## 2020-03-02 NOTE — Progress Notes (Signed)
Patient was previously seen by Dr Marin Olp. Last visit 2015  Reached out to Rush Farmer to introduce myself as the office RN Navigator and explain our new patient process. Reviewed the reason for their referral and scheduled their new patient appointment along with labs. Provided address and directions to the office including call back phone number. Reviewed with patient any concerns they may have or any possible barriers to attending their appointment.   Welcome letter, calendar, map to office and navigator contact info mailed to patient home.

## 2020-03-15 ENCOUNTER — Other Ambulatory Visit: Payer: Self-pay

## 2020-03-15 ENCOUNTER — Inpatient Hospital Stay (HOSPITAL_BASED_OUTPATIENT_CLINIC_OR_DEPARTMENT_OTHER): Payer: Medicare Other | Admitting: Hematology & Oncology

## 2020-03-15 ENCOUNTER — Ambulatory Visit: Payer: Medicare Other | Admitting: Hematology & Oncology

## 2020-03-15 ENCOUNTER — Other Ambulatory Visit: Payer: Medicare Other

## 2020-03-15 ENCOUNTER — Encounter: Payer: Self-pay | Admitting: Hematology & Oncology

## 2020-03-15 ENCOUNTER — Inpatient Hospital Stay: Payer: Medicare Other | Attending: Hematology & Oncology

## 2020-03-15 VITALS — BP 157/61 | HR 58 | Temp 97.6°F | Resp 17 | Wt 155.8 lb

## 2020-03-15 DIAGNOSIS — Z85118 Personal history of other malignant neoplasm of bronchus and lung: Secondary | ICD-10-CM | POA: Insufficient documentation

## 2020-03-15 DIAGNOSIS — Z7189 Other specified counseling: Secondary | ICD-10-CM

## 2020-03-15 DIAGNOSIS — F17210 Nicotine dependence, cigarettes, uncomplicated: Secondary | ICD-10-CM | POA: Diagnosis not present

## 2020-03-15 DIAGNOSIS — Z85038 Personal history of other malignant neoplasm of large intestine: Secondary | ICD-10-CM

## 2020-03-15 DIAGNOSIS — C3410 Malignant neoplasm of upper lobe, unspecified bronchus or lung: Secondary | ICD-10-CM

## 2020-03-15 DIAGNOSIS — R911 Solitary pulmonary nodule: Secondary | ICD-10-CM

## 2020-03-15 DIAGNOSIS — Z96641 Presence of right artificial hip joint: Secondary | ICD-10-CM

## 2020-03-15 HISTORY — DX: Other specified counseling: Z71.89

## 2020-03-15 LAB — CBC WITH DIFFERENTIAL (CANCER CENTER ONLY)
Abs Immature Granulocytes: 0.04 10*3/uL (ref 0.00–0.07)
Basophils Absolute: 0 10*3/uL (ref 0.0–0.1)
Basophils Relative: 1 %
Eosinophils Absolute: 0.2 10*3/uL (ref 0.0–0.5)
Eosinophils Relative: 3 %
HCT: 39.2 % (ref 39.0–52.0)
Hemoglobin: 13.5 g/dL (ref 13.0–17.0)
Immature Granulocytes: 1 %
Lymphocytes Relative: 24 %
Lymphs Abs: 1.4 10*3/uL (ref 0.7–4.0)
MCH: 33.6 pg (ref 26.0–34.0)
MCHC: 34.4 g/dL (ref 30.0–36.0)
MCV: 97.5 fL (ref 80.0–100.0)
Monocytes Absolute: 0.7 10*3/uL (ref 0.1–1.0)
Monocytes Relative: 11 %
Neutro Abs: 3.7 10*3/uL (ref 1.7–7.7)
Neutrophils Relative %: 60 %
Platelet Count: 109 10*3/uL — ABNORMAL LOW (ref 150–400)
RBC: 4.02 MIL/uL — ABNORMAL LOW (ref 4.22–5.81)
RDW: 14 % (ref 11.5–15.5)
WBC Count: 6 10*3/uL (ref 4.0–10.5)
nRBC: 0 % (ref 0.0–0.2)

## 2020-03-15 LAB — CMP (CANCER CENTER ONLY)
ALT: 7 U/L (ref 0–44)
AST: 9 U/L — ABNORMAL LOW (ref 15–41)
Albumin: 3.8 g/dL (ref 3.5–5.0)
Alkaline Phosphatase: 95 U/L (ref 38–126)
Anion gap: 4 — ABNORMAL LOW (ref 5–15)
BUN: 11 mg/dL (ref 8–23)
CO2: 31 mmol/L (ref 22–32)
Calcium: 8.9 mg/dL (ref 8.9–10.3)
Chloride: 107 mmol/L (ref 98–111)
Creatinine: 0.76 mg/dL (ref 0.61–1.24)
GFR, Est AFR Am: >60 mL/min (ref >60)
GFR, Estimated: >60 mL/min (ref >60)
Glucose, Bld: 93 mg/dL (ref 70–99)
Potassium: 3.7 mmol/L (ref 3.5–5.1)
Sodium: 142 mmol/L (ref 135–145)
Total Bilirubin: 0.5 mg/dL (ref 0.3–1.2)
Total Protein: 6.1 g/dL — ABNORMAL LOW (ref 6.5–8.1)

## 2020-03-15 NOTE — Progress Notes (Signed)
Referral MD  Reason for Referral: Likely bronchogenic carcinoma of the right lung-likely clinical stage III ([id number])  Chief Complaint  Patient presents with  . Follow-up  : I am not sure what is wrong.  HPI: Jerome Kelley is well-known to me.  I have not seen him for about 6 years.  We had previously seen him because of a history of stage Ia adenocarcinoma of the right lung that was resected in March 2011.  He also has a history of stage II colon cancer that was resected back in April 2010.  Last time we saw him was in 2015.  He is still smoking.  He smokes probably a pack per day.  He is followed by Dr. Laurann Montana.  As always, Dr. Laurann Montana does a fantastic job with her patients.  She noted that he had enlarging nodule in the right lung.  She went ahead and did a chest CT scan on.  This was done on 02/11/2020.  This showed numerous calcified mediastinal lymph nodes.  There was a right upper lobe nodule that increased in size to 8 x 5 mm.  He then underwent a PET scan.  The PET scan was done on 02/23/2020.  The PET scan showed the lymph nodes in the right mediastinum, hilum, supraclavicular region to be quite active.  The lung nodule however did not have as much activity.  There was no disease elsewhere.  It was quite concerning for the possibility of metastatic/locally advanced bronchogenic carcinoma.  He was then referred to the [facility] for an evaluation.  He has not lost weight.  He has had a little bit of a cough.  There is no hemoptysis.  He has had no chest wall pain.  He has had no nausea or vomiting.  There is been no change in bowel or bladder habits.  He has had no joint issues.   Overall, I would say his performance status is ECOG 1.    Past Medical History:  Diagnosis Date  . Colon cancer ([place]) 09/18/2011  . History of chemotherapy   . Hypertension   . Lung cancer, upper lobe ([place]) 09/18/2011  :  Past Surgical History:  Procedure Laterality Date  . COLON  SURGERY  2010  . LUNG SURGERY     right side   . port a cath insertion    . port a cath removed    . TOTAL HIP ARTHROPLASTY Right 10/18/2018   Procedure: TOTAL HIP ARTHROPLASTY ANTERIOR APPROACH;  Surgeon: Mcarthur Rossetti, MD;  Location: [facility];  Service: Orthopedics;  Laterality: Right;  :   Current Outpatient Medications:  .  aspirin 81 MG chewable tablet, Chew 1 tablet (81 mg total) by mouth 2 (two) times daily., Disp: 35 tablet, Rfl: 0 .  benazepril (LOTENSIN) 20 MG tablet, Take 20 mg by mouth daily., Disp: , Rfl:  .  vitamin B-12 (CYANOCOBALAMIN) 1000 MCG tablet, Take 1 tablet (1,000 mcg total) by mouth daily., Disp: 30 tablet, Rfl: 0:  :  No Known Allergies:  No family history on file.:  Social History   Socioeconomic History  . Marital status: Married    Spouse name: Not on file  . Number of children: Not on file  . Years of education: Not on file  . Highest education level: Not on file  Occupational History  . Not on file  Tobacco Use  . Smoking status: Current Every Day Smoker    Packs/day: 1.00    Years: 58.00  Pack years: 58.00    Types: Cigarettes    Start date: 09/03/1956  . Smokeless tobacco: Never Used  . Tobacco comment: does not want to quit  Vaping Use  . Vaping Use: Never used  Substance and Sexual Activity  . Alcohol use: Yes    Comment: occas beer  . Drug use: Never  . Sexual activity: Not on file  Other Topics Concern  . Not on file  Social History Narrative  . Not on file   Social Determinants of Health   Financial Resource Strain:   . Difficulty of Paying Living Expenses:   Food Insecurity:   . Worried About Charity fundraiser in the Last Year:   . Arboriculturist in the Last Year:   Transportation Needs:   . Film/video editor (Medical):   Marland Kitchen Lack of Transportation (Non-Medical):   Physical Activity:   . Days of Exercise per Week:   . Minutes of Exercise per Session:   Stress:   . Feeling of Stress :   Social  Connections:   . Frequency of Communication with Friends and Family:   . Frequency of Social Gatherings with Friends and Family:   . Attends Religious Services:   . Active Member of Clubs or Organizations:   . Attends Archivist Meetings:   Marland Kitchen Marital Status:   Intimate Partner Violence:   . Fear of Current or Ex-Partner:   . Emotionally Abused:   Marland Kitchen Physically Abused:   . Sexually Abused:   :  Review of Systems  Constitutional: Negative.   HENT: Negative.   Eyes: Negative.   Respiratory: Negative.   Cardiovascular: Negative.   Gastrointestinal: Negative.   Genitourinary: Negative.   Musculoskeletal: Negative.   Skin: Negative.   Neurological: Negative.   Endo/Heme/Allergies: Negative.   Psychiatric/Behavioral: Negative.      Exam:  This is a elderly but well-nourished white male in no obvious distress.  Vital signs show temperature of 97.6.  Pulse 58.  Blood pressure 157/61.  Weight is 155 pounds.  Head and neck exam shows no ocular or oral lesions.  There are no palpable cervical or supraclavicular lymph nodes.  Lungs are clear bilaterally.  No wheezes or rhonchi are noted.  Cardiac exam regular rate and rhythm with no murmurs, rubs or bruits.  Abdomen is soft.  Has healed laparotomy scar from past colon cancer surgery.  There is no fluid wave.  There is no guarding or rebound tenderness.  There is no palpable liver or spleen tip.  Back exam shows no tenderness over the spine, ribs or hips.  Neurological exam shows no focal neurological deficits.  Skin exam shows no rashes, ecchymoses or petechia.  @IPVITALS @   Recent Labs    03/15/20 1451  WBC 6.0  HGB 13.5  HCT 39.2  PLT 109*   Recent Labs    03/15/20 1451  NA 142  K 3.7  CL 107  CO2 31  GLUCOSE 93  BUN 11  CREATININE 0.76  CALCIUM 8.9    Blood smear review: None  Pathology: None    Assessment and Plan: Jerome Kelley is a very nice 79 year old white male.  He has a past history of early stage  lung cancer and colon cancer.  It looks like he might have another recurrence of the lung cancer.  By the PET scan, this would have to be considered stage IIIb by virtue of the positive right supraclavicular lymph node on PET scan.  I think a problem that we may have however is the fact that on past scans, he has had calcified mediastinal lymph nodes.  As such, but could be active on the PET scan might not be malignant.  He will clearly need to have a biopsy.  I will see if pulmonary might be able to help [place] out.  They can do endoscopic ultrasound biopsies with the bronchoscope.  If, for some reason, we cannot get enough material with bronchoscopy, then he will need to have a mediastinoscopy done.  I spent about an hour with Jerome Kelley and his wife.  I know them both quite well.  I went over the scans with them.  I explained my recommendations.  I told them that if this is truly cancer, that it is not surgically resectable because of the supraclavicular lymph node.  However, he could potentially be cured with radiation chemotherapy.  I realize this would be somewhat difficult given his age.  He is still smoking.  I have tried to get him to stop smoking.  I think continued smoking will definitely make response to therapy more difficult.  Once he has the bronchoscopy and biopsies, then we will try to get him back to the office.

## 2020-03-16 ENCOUNTER — Other Ambulatory Visit (HOSPITAL_COMMUNITY)
Admission: RE | Admit: 2020-03-16 | Discharge: 2020-03-16 | Disposition: A | Discharge status: Home / Self Care | Payer: Medicare Other | Source: Ambulatory Visit | Attending: Internal Medicine | Admitting: Internal Medicine

## 2020-03-16 ENCOUNTER — Telehealth: Payer: Self-pay | Admitting: *Deleted

## 2020-03-16 ENCOUNTER — Encounter: Payer: Self-pay | Admitting: Internal Medicine

## 2020-03-16 ENCOUNTER — Telehealth: Payer: Self-pay | Admitting: Hematology & Oncology

## 2020-03-16 DIAGNOSIS — Z01812 Encounter for preprocedural laboratory examination: Secondary | ICD-10-CM | POA: Insufficient documentation

## 2020-03-16 DIAGNOSIS — Z20822 Contact with and (suspected) exposure to covid-19: Secondary | ICD-10-CM | POA: Insufficient documentation

## 2020-03-16 DIAGNOSIS — R591 Generalized enlarged lymph nodes: Secondary | ICD-10-CM

## 2020-03-16 DIAGNOSIS — R599 Enlarged lymph nodes, unspecified: Secondary | ICD-10-CM

## 2020-03-16 LAB — SARS CORONAVIRUS 2 (TAT 6-24 HRS): SARS Coronavirus 2: NEGATIVE

## 2020-03-16 LAB — CEA (IN HOUSE-CHCC): CEA (CHCC-In House): 26.18 ng/mL — ABNORMAL HIGH (ref 0.00–5.00)

## 2020-03-16 LAB — LACTATE DEHYDROGENASE: LDH: 170 U/L (ref 98–192)

## 2020-03-16 NOTE — Telephone Encounter (Signed)
I have pt scheduled at [facility] on 8/6 at 9:45.  He will go for covid test this afternoon.  I have spoken to pt & made him aware he will need to check in at 7:15 and Dr Tamala Julian mentioned in note he wants to consult prior to procedure on day of biopsy so may get a call giving him different info.

## 2020-03-16 NOTE — Telephone Encounter (Signed)
-----   Message from Candee Furbish, MD sent at 03/16/2020  7:33 AM EDT ----- Regarding: Procedure to schedule Hi can we schedule this patient for this Thurs or Fri depending on when COVID can get back.  Procedure: EBUS Bronchoscopy CPT [id number] Indication: Adenopathy (ICD10 59.0) Operator: Dr. Tamala Julian I will do consult the morning of procedure  I already called him and he is free. He is on no blood thinners.  Please let me know procedure date/time   Thanks, Dan ----- Message ----- From: Garner Nash, DO Sent: 03/16/2020  12:41 AM EDT To: [name], MD, Candee Furbish, MD  Linna Hoff,  Can you get this guy worked in for EBUS?   Maybe consult day of procedure if patient ok with that.   Laurey Arrow - thanks for the referral. I am on nights so I will get him work up by one of my partners.   Thanks  Brad   ----- Message ----- From: [name], MD Sent: 03/15/2020   3:49 PM EDT To: Garner Nash, DO  Brad:  is there anyway you can do FOB with endoscopic U/S on Mr. Ouzts??  By the PET scan, it looks like he may have stage IIIB lung cancer.  We really need some good biopsies for our molecular studies.    Thanks for your help!!  Laurey Arrow

## 2020-03-16 NOTE — Telephone Encounter (Signed)
No los 8/2

## 2020-03-17 NOTE — Telephone Encounter (Signed)
Spoke with Sonia Baller at [facility] regarding procedure for Friday.  She stated that ideally the procedure should occur at [facility] as the schedule is wide open for the room.  She has reserved a room at both [facility] and [facility], but like Dr. Tamala Julian to be made aware so we can confirm that it can be done at [facility].  Sonia Baller needs to know so she can notify the patient.  I sent a secure chat to Dr. Tamala Julian and paged him as well.  Will wait to hear back from Dr. Tamala Julian.

## 2020-03-17 NOTE — Telephone Encounter (Signed)
Spoke with Sonia Baller in Endo and the issue has been resolved.  Dr. Lake Bells is going to do the procedure. Nothing further needed.

## 2020-03-17 NOTE — Telephone Encounter (Signed)
Sonia Baller from [facility] would like to discuss patient's procedure on 03/19/2020. Sonia Baller phone number is [phone].

## 2020-03-19 ENCOUNTER — Ambulatory Visit (HOSPITAL_COMMUNITY)
Admission: RE | Admit: 2020-03-19 | Discharge: 2020-03-19 | Disposition: A | Discharge status: Home / Self Care | Payer: Medicare Other | Attending: Pulmonary Disease | Admitting: Pulmonary Disease

## 2020-03-19 ENCOUNTER — Ambulatory Visit (HOSPITAL_COMMUNITY): Payer: Medicare Other | Admitting: Certified Registered Nurse Anesthetist

## 2020-03-19 ENCOUNTER — Encounter (HOSPITAL_COMMUNITY): Payer: Self-pay | Admitting: Pulmonary Disease

## 2020-03-19 ENCOUNTER — Encounter (HOSPITAL_COMMUNITY)
Admission: RE | Disposition: A | Discharge status: Home / Self Care | Payer: Self-pay | Source: Home / Self Care | Attending: Pulmonary Disease

## 2020-03-19 ENCOUNTER — Other Ambulatory Visit: Payer: Self-pay

## 2020-03-19 DIAGNOSIS — R918 Other nonspecific abnormal finding of lung field: Secondary | ICD-10-CM | POA: Diagnosis not present

## 2020-03-19 DIAGNOSIS — F17210 Nicotine dependence, cigarettes, uncomplicated: Secondary | ICD-10-CM | POA: Diagnosis not present

## 2020-03-19 DIAGNOSIS — I10 Essential (primary) hypertension: Secondary | ICD-10-CM | POA: Diagnosis not present

## 2020-03-19 DIAGNOSIS — Z85118 Personal history of other malignant neoplasm of bronchus and lung: Secondary | ICD-10-CM | POA: Insufficient documentation

## 2020-03-19 DIAGNOSIS — Z9221 Personal history of antineoplastic chemotherapy: Secondary | ICD-10-CM | POA: Insufficient documentation

## 2020-03-19 DIAGNOSIS — R911 Solitary pulmonary nodule: Secondary | ICD-10-CM

## 2020-03-19 DIAGNOSIS — C771 Secondary and unspecified malignant neoplasm of intrathoracic lymph nodes: Secondary | ICD-10-CM | POA: Diagnosis not present

## 2020-03-19 DIAGNOSIS — R599 Enlarged lymph nodes, unspecified: Secondary | ICD-10-CM | POA: Diagnosis not present

## 2020-03-19 DIAGNOSIS — Z9189 Other specified personal risk factors, not elsewhere classified: Secondary | ICD-10-CM

## 2020-03-19 DIAGNOSIS — Z85038 Personal history of other malignant neoplasm of large intestine: Secondary | ICD-10-CM | POA: Diagnosis not present

## 2020-03-19 DIAGNOSIS — R590 Localized enlarged lymph nodes: Secondary | ICD-10-CM | POA: Diagnosis not present

## 2020-03-19 DIAGNOSIS — C801 Malignant (primary) neoplasm, unspecified: Secondary | ICD-10-CM | POA: Diagnosis not present

## 2020-03-19 DIAGNOSIS — C3411 Malignant neoplasm of upper lobe, right bronchus or lung: Secondary | ICD-10-CM

## 2020-03-19 HISTORY — PX: BRONCHIAL WASHINGS: SHX5105

## 2020-03-19 HISTORY — PX: FINE NEEDLE ASPIRATION: SHX6590

## 2020-03-19 HISTORY — PX: VIDEO BRONCHOSCOPY: SHX5072

## 2020-03-19 HISTORY — PX: ENDOBRONCHIAL ULTRASOUND: SHX5096

## 2020-03-19 LAB — APTT: aPTT: 30 seconds (ref 24–36)

## 2020-03-19 LAB — PROTIME-INR
INR: 1 (ref 0.8–1.2)
Prothrombin Time: 13 seconds (ref 11.4–15.2)

## 2020-03-19 SURGERY — VIDEO BRONCHOSCOPY WITHOUT FLUORO
Anesthesia: General

## 2020-03-19 MED ORDER — FENTANYL CITRATE (PF) 100 MCG/2ML IJ SOLN
INTRAMUSCULAR | Status: DC | PRN
  Administered 2020-03-19: 50 ug via INTRAVENOUS

## 2020-03-19 MED ORDER — PROPOFOL 10 MG/ML IV BOLUS
INTRAVENOUS | Status: DC | PRN
  Administered 2020-03-19: 130 mg via INTRAVENOUS

## 2020-03-19 MED ORDER — FENTANYL CITRATE (PF) 100 MCG/2ML IJ SOLN
INTRAMUSCULAR | Status: AC
  Filled 2020-03-19: qty 2

## 2020-03-19 MED ORDER — LACTATED RINGERS IV SOLN: INTRAVENOUS | Status: DC | PRN

## 2020-03-19 MED ORDER — ONDANSETRON HCL 4 MG/2ML IJ SOLN
INTRAMUSCULAR | Status: AC
  Filled 2020-03-19: qty 2

## 2020-03-19 MED ORDER — SUGAMMADEX SODIUM 200 MG/2ML IV SOLN
INTRAVENOUS | Status: DC | PRN
  Administered 2020-03-19: 200 mg via INTRAVENOUS

## 2020-03-19 MED ORDER — LACTATED RINGERS IV SOLN
INTRAVENOUS | Status: AC | PRN
  Administered 2020-03-19: 10 mL/h via INTRAVENOUS

## 2020-03-19 MED ORDER — LIDOCAINE HCL (CARDIAC) PF 100 MG/5ML IV SOSY
PREFILLED_SYRINGE | INTRAVENOUS | Status: DC | PRN
  Administered 2020-03-19: 60 mg via INTRAVENOUS

## 2020-03-19 MED ORDER — ONDANSETRON HCL 4 MG/2ML IJ SOLN
INTRAMUSCULAR | Status: DC | PRN
  Administered 2020-03-19: 4 mg via INTRAVENOUS

## 2020-03-19 MED ORDER — ROCURONIUM BROMIDE 100 MG/10ML IV SOLN
INTRAVENOUS | Status: DC | PRN
  Administered 2020-03-19: 80 mg via INTRAVENOUS

## 2020-03-19 NOTE — Discharge Instructions (Signed)
Flexible Bronchoscopy, Care After This sheet gives you information about how to care for yourself after your test. Your doctor may also give you more specific instructions. If you have problems or questions, contact your doctor. Follow these instructions at home: Eating and drinking  Do not eat or drink anything (not even water) for 2 hours after your test, or until your numbing medicine (local anesthetic) wears off.  When your numbness is gone and your cough and gag reflexes have come back, you may: ? Eat only soft foods. ? Slowly drink liquids.  The day after the test, go back to your normal diet. Driving  Do not drive for 24 hours if you were given a medicine to help you relax (sedative).  Do not drive or use heavy machinery while taking prescription pain medicine. General instructions   Take over-the-counter and prescription medicines only as told by your doctor.  Return to your normal activities as told. Ask what activities are safe for you.  Do not use any products that have nicotine or tobacco in them. This includes cigarettes and e-cigarettes. If you need help quitting, ask your doctor.  Keep all follow-up visits as told by your doctor. This is important. It is very important if you had a tissue sample (biopsy) taken. Get help right away if:  You have shortness of breath that gets worse.  You get light-headed.  You feel like you are going to pass out (faint).  You have chest pain.  You cough up: ? More than a little blood. ? More blood than before. Summary  Do not eat or drink anything (not even water) for 2 hours after your test, or until your numbing medicine wears off.  Do not use cigarettes. Do not use e-cigarettes.  Get help right away if you have chest pain. This information is not intended to replace advice given to you by your health care provider. Make sure you discuss any questions you have with your health care provider. Document Revised: 07/13/2017  Document Reviewed: 08/18/2016 Elsevier Patient Education  2020 [employer].

## 2020-03-19 NOTE — Anesthesia Preprocedure Evaluation (Addendum)
Anesthesia Evaluation  Patient identified by MRN, date of birth, ID band Patient awake    Reviewed: Allergy & Precautions, NPO status , Patient's Chart, lab work & pertinent test results  Airway Mallampati: III  TM Distance: >3 FB Neck ROM: Full    Dental no notable dental hx. (+) Edentulous Upper, Edentulous Lower   Pulmonary neg pulmonary ROS, Current SmokerPatient did not abstain from smoking.,    Pulmonary exam normal breath sounds clear to auscultation       Cardiovascular hypertension, Pt. on medications negative cardio ROS Normal cardiovascular exam Rhythm:Regular Rate:Normal     Neuro/Psych negative neurological ROS  negative psych ROS   GI/Hepatic negative GI ROS, Neg liver ROS,   Endo/Other  negative endocrine ROS  Renal/GU negative Renal ROS  negative genitourinary   Musculoskeletal negative musculoskeletal ROS (+)   Abdominal   Peds  Hematology negative hematology ROS (+)   Anesthesia Other Findings H/o colon CA and lung CA  Reproductive/Obstetrics                            Anesthesia Physical Anesthesia Plan  ASA: II  Anesthesia Plan: General   Post-op Pain Management:    Induction: Intravenous  PONV Risk Score and Plan: 1 and Midazolam, Dexamethasone and Ondansetron  Airway Management Planned: Oral ETT  Additional Equipment:   Intra-op Plan:   Post-operative Plan: Extubation in OR  Informed Consent: I have reviewed the patients History and Physical, chart, labs and discussed the procedure including the risks, benefits and alternatives for the proposed anesthesia with the patient or authorized representative who has indicated his/her understanding and acceptance.     Dental advisory given  Plan Discussed with: CRNA  Anesthesia Plan Comments:         Anesthesia Quick Evaluation

## 2020-03-19 NOTE — H&P (View-Only) (Signed)
[facility] PCCM  CC: weight loss HPI: This is a very pleasant 79 year old male who smokes 1 pack of cigarettes daily and has done so ever since he was a teenager who also has a history of colon and lung cancer who presented to his primary care doctor complaining of weight loss.  He had a CT scan performed which showed mediastinal adenopathy.  He was sent for a PET scan which showed evidence of hypermetabolism in the right middle lobe pulmonary nodule as well as in mediastinal and hilar adenopathy.  He tells me that over the last several months he has had a new cough with mucus production.  He has lost about 10 pounds over the last year or so.  He denies shortness of breath.  He saw oncology who referred him to [place] for further evaluation and tissue biopsy for further planning.  At some point he had a right lower lobe lobectomy+  Past Medical History:  Diagnosis Date  . Colon cancer ([place]) 09/18/2011  . Goals of care, counseling/discussion 03/15/2020  . History of chemotherapy   . Hypertension   . Lung cancer, upper lobe ([place]) 09/18/2011     History reviewed. No pertinent family history.   Social History   Socioeconomic History  . Marital status: Married    Spouse name: Not on file  . Number of children: Not on file  . Years of education: Not on file  . Highest education level: Not on file  Occupational History  . Not on file  Tobacco Use  . Smoking status: Current Every Day Smoker    Packs/day: 1.00    Years: 58.00    Pack years: 58.00    Types: Cigarettes    Start date: 09/03/1956  . Smokeless tobacco: Never Used  . Tobacco comment: does not want to quit  Vaping Use  . Vaping Use: Never used  Substance and Sexual Activity  . Alcohol use: Yes    Comment: occas beer  . Drug use: Never  . Sexual activity: Not on file  Other Topics Concern  . Not on file  Social History Narrative  . Not on file   Social Determinants of Health   Financial Resource Strain:   . Difficulty of Paying Living  Expenses:   Food Insecurity:   . Worried About Charity fundraiser in the Last Year:   . Arboriculturist in the Last Year:   Transportation Needs:   . Film/video editor (Medical):   Marland Kitchen Lack of Transportation (Non-Medical):   Physical Activity:   . Days of Exercise per Week:   . Minutes of Exercise per Session:   Stress:   . Feeling of Stress :   Social Connections:   . Frequency of Communication with Friends and Family:   . Frequency of Social Gatherings with Friends and Family:   . Attends Religious Services:   . Active Member of Clubs or Organizations:   . Attends Archivist Meetings:   Marland Kitchen Marital Status:   Intimate Partner Violence:   . Fear of Current or Ex-Partner:   . Emotionally Abused:   Marland Kitchen Physically Abused:   . Sexually Abused:      No Known Allergies   _0 @  There were no vitals filed for this visit.  General:  Resting comfortably in bed HENT: NCAT OP clear PULM: CTA B, normal effort CV: RRR, no mgr GI: BS+, soft, nontender MSK: normal bulk and tone Neuro: awake, alert, no distress, MAEW  CBC    Component Value Date/Time   WBC 6.0 03/15/2020 1451   WBC 7.7 10/20/2018 0412   RBC 4.02 (L) 03/15/2020 1451   HGB 13.5 03/15/2020 1451   HGB 15.3 09/03/2013 0838   HCT 39.2 03/15/2020 1451   HCT 44.0 09/03/2013 0838   PLT 109 (L) 03/15/2020 1451   PLT 107 (L) 09/03/2013 0838   MCV 97.5 03/15/2020 1451   MCV 98 09/03/2013 0838   MCH 33.6 03/15/2020 1451   MCHC 34.4 03/15/2020 1451   RDW 14.0 03/15/2020 1451   RDW 13.7 09/03/2013 0838   LYMPHSABS 1.4 03/15/2020 1451   LYMPHSABS 1.3 09/03/2013 0838   MONOABS 0.7 03/15/2020 1451   EOSABS 0.2 03/15/2020 1451   EOSABS 0.2 09/03/2013 0838   BASOSABS 0.0 03/15/2020 1451   BASOSABS 0.0 09/03/2013 0838    BMET    Component Value Date/Time   NA 142 03/15/2020 1451   NA 140 06/28/2011 0950   K 3.7 03/15/2020 1451   K 4.7 06/28/2011 0950   CL 107 03/15/2020 1451   CL 100 06/28/2011  0950   CO2 31 03/15/2020 1451   CO2 29 06/28/2011 0950   GLUCOSE 93 03/15/2020 1451   GLUCOSE 98 06/28/2011 0950   BUN 11 03/15/2020 1451   BUN 10 06/28/2011 0950   CREATININE 0.76 03/15/2020 1451   CREATININE 0.7 06/28/2011 0950   CALCIUM 8.9 03/15/2020 1451   CALCIUM 9.0 06/28/2011 0950   GFRNONAA >60 03/15/2020 1451   GFRAA >60 03/15/2020 1451   CT images/PET images: From late June 2020 as well as July 2020 personally reviewed showing hypermetabolic station 7, station 4R, station 10 R lymphadenopathy as well as a hypermetabolic 9 mm right middle lobe nodule.  Some emphysema noted  Impression: Smoker with hypermetabolic pulmonary nodule and mediastinal lymphadenopathy: Worrisome for primary bronchogenic malignancy, differential diagnosis includes colon cancer  Plan: Bronchoscopy with EBUS, mediastinal lymph node tissue sampling today We will obtain BAL for culture including AFB  The patient was seen and examined today, we explained the risks and benefits of the procedure and he is willing to proceed.  Roselie Awkward, MD [place] [facility] Pager: [phone] Cell: [phone] If no response, call [phone]

## 2020-03-19 NOTE — H&P (Signed)
[facility]  CC: weight loss HPI: This is a very pleasant 79 year old male who smokes 1 pack of cigarettes daily and has done so ever since he was a teenager who also has a history of colon and lung cancer who presented to his primary care doctor complaining of weight loss.  He had a CT scan performed which showed mediastinal adenopathy.  He was sent for a PET scan which showed evidence of hypermetabolism in the right middle lobe pulmonary nodule as well as in mediastinal and hilar adenopathy.  He tells me that over the last several months he has had a new cough with mucus production.  He has lost about 10 pounds over the last year or so.  He denies shortness of breath.  He saw oncology who referred him to [place] for further evaluation and tissue biopsy for further planning.  At some point he had a right lower lobe lobectomy+  Past Medical History:  Diagnosis Date  . Colon cancer ([place]) 09/18/2011  . Goals of care, counseling/discussion 03/15/2020  . History of chemotherapy   . Hypertension   . Lung cancer, upper lobe ([place]) 09/18/2011     History reviewed. No pertinent family history.   Social History   Socioeconomic History  . Marital status: Married    Spouse name: Not on file  . Number of children: Not on file  . Years of education: Not on file  . Highest education level: Not on file  Occupational History  . Not on file  Tobacco Use  . Smoking status: Current Every Day Smoker    Packs/day: 1.00    Years: 58.00    Pack years: 58.00    Types: Cigarettes    Start date: 09/03/1956  . Smokeless tobacco: Never Used  . Tobacco comment: does not want to quit  Vaping Use  . Vaping Use: Never used  Substance and Sexual Activity  . Alcohol use: Yes    Comment: occas beer  . Drug use: Never  . Sexual activity: Not on file  Other Topics Concern  . Not on file  Social History Narrative  . Not on file   Social Determinants of Health   Financial Resource Strain:   . Difficulty of Paying Living  Expenses:   Food Insecurity:   . Worried About Charity fundraiser in the Last Year:   . Arboriculturist in the Last Year:   Transportation Needs:   . Film/video editor (Medical):   Marland Kitchen Lack of Transportation (Non-Medical):   Physical Activity:   . Days of Exercise per Week:   . Minutes of Exercise per Session:   Stress:   . Feeling of Stress :   Social Connections:   . Frequency of Communication with Friends and Family:   . Frequency of Social Gatherings with Friends and Family:   . Attends Religious Services:   . Active Member of Clubs or Organizations:   . Attends Archivist Meetings:   Marland Kitchen Marital Status:   Intimate Partner Violence:   . Fear of Current or Ex-Partner:   . Emotionally Abused:   Marland Kitchen Physically Abused:   . Sexually Abused:      No Known Allergies   _0 @  There were no vitals filed for this visit.  General:  Resting comfortably in bed HENT: NCAT OP clear PULM: CTA B, normal effort CV: RRR, no mgr GI: BS+, soft, nontender MSK: normal bulk and tone Neuro: awake, alert, no distress, MAEW  CBC    Component Value Date/Time   WBC 6.0 03/15/2020 1451   WBC 7.7 10/20/2018 0412   RBC 4.02 (L) 03/15/2020 1451   HGB 13.5 03/15/2020 1451   HGB 15.3 09/03/2013 0838   HCT 39.2 03/15/2020 1451   HCT 44.0 09/03/2013 0838   PLT 109 (L) 03/15/2020 1451   PLT 107 (L) 09/03/2013 0838   MCV 97.5 03/15/2020 1451   MCV 98 09/03/2013 0838   MCH 33.6 03/15/2020 1451   MCHC 34.4 03/15/2020 1451   RDW 14.0 03/15/2020 1451   RDW 13.7 09/03/2013 0838   LYMPHSABS 1.4 03/15/2020 1451   LYMPHSABS 1.3 09/03/2013 0838   MONOABS 0.7 03/15/2020 1451   EOSABS 0.2 03/15/2020 1451   EOSABS 0.2 09/03/2013 0838   BASOSABS 0.0 03/15/2020 1451   BASOSABS 0.0 09/03/2013 0838    BMET    Component Value Date/Time   NA 142 03/15/2020 1451   NA 140 06/28/2011 0950   K 3.7 03/15/2020 1451   K 4.7 06/28/2011 0950   CL 107 03/15/2020 1451   CL 100 06/28/2011  0950   CO2 31 03/15/2020 1451   CO2 29 06/28/2011 0950   GLUCOSE 93 03/15/2020 1451   GLUCOSE 98 06/28/2011 0950   BUN 11 03/15/2020 1451   BUN 10 06/28/2011 0950   CREATININE 0.76 03/15/2020 1451   CREATININE 0.7 06/28/2011 0950   CALCIUM 8.9 03/15/2020 1451   CALCIUM 9.0 06/28/2011 0950   GFRNONAA >60 03/15/2020 1451   GFRAA >60 03/15/2020 1451   CT images/PET images: From late June 2020 as well as July 2020 personally reviewed showing hypermetabolic station 7, station 4R, station 10 R lymphadenopathy as well as a hypermetabolic 9 mm right middle lobe nodule.  Some emphysema noted  Impression: Smoker with hypermetabolic pulmonary nodule and mediastinal lymphadenopathy: Worrisome for primary bronchogenic malignancy, differential diagnosis includes colon cancer  Plan: Bronchoscopy with EBUS, mediastinal lymph node tissue sampling today We will obtain BAL for culture including AFB  The patient was seen and examined today, we explained the risks and benefits of the procedure and he is willing to proceed.  Roselie Awkward, MD [place] [facility] Pager: [phone] Cell: [phone] If no response, call [phone]

## 2020-03-19 NOTE — Op Note (Signed)
[facility] Cardiopulmonary Patient Name: Jerome Kelley Procedure Date: 03/19/2020 MRN: [id number] Attending MD: Juanito Doom , MD Date of Birth: 06/20/1941 CSN: [id number] Age: 79 Admit Type: Outpatient Ethnicity: Not Hispanic or Latino Procedure:             Bronchoscopy Indications:           Right middle lobe mass, Mediastinal adenopathy Providers:             Nathaneil Canary B. Lake Bells, MD, Elmer Ramp. Tilden Dome, RN, Theodora Blow, Technician, Faustina Mbumina, Technician Referring MD:           Medicines:             General Anesthesia Complications:         No immediate complications Estimated Blood Loss:  Estimated blood loss was minimal. Procedure:      Pre-Anesthesia Assessment:      - A History and Physical has been performed. Patient meds and allergies       have been reviewed. The risks and benefits of the procedure and the       sedation options and risks were discussed with the patient. All       questions were answered and informed consent was obtained. Patient       identification and proposed procedure were verified prior to the       procedure by the physician in the pre-procedure area. Mental Status       Examination: alert and oriented. Airway Examination: normal       oropharyngeal airway. Respiratory Examination: clear to auscultation. CV       Examination: normal. ASA Grade Assessment: II - A patient with mild       systemic disease. After reviewing the risks and benefits, the patient       was deemed in satisfactory condition to undergo the procedure. The       anesthesia plan was to use moderate sedation / analgesia (conscious       sedation). Immediately prior to administration of medications, the       patient was re-assessed for adequacy to receive sedatives. The heart       rate, respiratory rate, oxygen saturations, blood pressure, adequacy of       pulmonary ventilation, and response to care were monitored throughout        the procedure. The physical status of the patient was re-assessed after       the procedure.      After obtaining informed consent, the bronchoscope was passed under       direct vision. Throughout the procedure, the patient's blood pressure,       pulse, and oxygen saturations were monitored continuously. the [id number]       ([id number]) Olympus therapeutic bronchoscope was introduced through the       mouth, via the endotracheal tube and advanced to the tracheobronchial       tree. the BF-[id number] ([id number]) Olympus EBUS was introduced through the       and advanced to the. The procedure was accomplished without difficulty.       The patient tolerated the procedure well. The total duration of the       procedure was 30 minutes. Findings:      The endotracheal tube is in good position. The trachea is of  normal       caliber. The carina is sharp. The tracheobronchial tree of the left lung       was examined to at least the first subsegmental level. Bronchial mucosa       and anatomy in the left lung are normal; there are no endobronchial       lesions, and no secretions.      Right Lung Abnormalities: Evidence of previous surgery was found in the       right lower lobe. The bronchial stump is well healed. The remainder of       the tracheobronchial tree was normal, there was no endobronchial lesion       identified. BAL was performed in the right middle lobe of the lung and       sent for routine cytology and bacterial, AFB and fungal analysis. 60 mL       of fluid were instilled. 20 mL were returned. There were no mucoid plugs       in the return fluid. Transbronchial needle aspiration of an adenopathy       was performed in the subcarinal area using a [employer]       25 gauge needle and sent for routine cytology. The procedure was guided       by ultrasound. Transbronchial needle aspiration technique was selected       because the sampling site was not visible endoscopically. 6  were       obtained. Stage N2. Estimated blood loss: minimal. Impression:      - Right middle lobe mass      - Mediastinal adenopathy > preliminary report from needle aspiration is       consistent with non-small cell lung cancer      - The airway examination of the left lung was normal.      - Evidence of previous surgery was found in the right lower lobe.      - Bronchoalveolar lavage was performed.      - A transbronchial needle aspiration was performed. Moderate Sedation:      General Anesthesia Recommendation:      - Await BAL, culture and cytology results. Procedure Code(s):      --- Professional ---      [phone], Bronchoscopy, rigid or flexible, including fluoroscopic guidance,       when performed; with transbronchial needle aspiration biopsy(s),       trachea, main stem and/or lobar bronchus(i)      31624, Bronchoscopy, rigid or flexible, including fluoroscopic guidance,       when performed; with bronchial alveolar lavage      31654, Bronchoscopy, rigid or flexible, including fluoroscopic guidance,       when performed; with transendoscopic endobronchial ultrasound (EBUS)       during bronchoscopic diagnostic or therapeutic intervention(s) for       peripheral lesion(s) (List separately in addition to code for primary       procedure[s]) Diagnosis Code(s):      --- Professional ---      R91.8, Other nonspecific abnormal finding of lung field      R59.0, Localized enlarged lymph nodes      Z98.890, Other specified postprocedural states CPT copyright 2019 American Medical Association. All rights reserved. The codes documented in this report are preliminary and upon coder review may  be revised to meet current compliance requirements. Norlene Campbell, MD Juanito Doom, MD 03/19/2020 12:41:55 PM This report  has been signed electronically. Number of Addenda: 0 Scope In: Scope Out:

## 2020-03-19 NOTE — Anesthesia Postprocedure Evaluation (Signed)
Anesthesia Post Note  Patient: Jerome Kelley  Procedure(s) Performed: VIDEO BRONCHOSCOPY WITHOUT FLUORO (N/A ) ENDOBRONCHIAL ULTRASOUND (N/A ) FINE NEEDLE ASPIRATION BRONCHIAL WASHINGS     Patient location during evaluation: Endoscopy Anesthesia Type: General Level of consciousness: awake and alert Pain management: pain level controlled Vital Signs Assessment: post-procedure vital signs reviewed and stable Respiratory status: spontaneous breathing, nonlabored ventilation, respiratory function stable and patient connected to nasal cannula oxygen Cardiovascular status: blood pressure returned to baseline and stable Postop Assessment: no apparent nausea or vomiting Anesthetic complications: no   No complications documented.  Last Vitals:  Vitals:   03/19/20 1255 03/19/20 1300  BP:  (!) 182/62  Pulse: (!) 50 (!) 51  Resp: 15 16  Temp:    SpO2: 100% 98%    Last Pain:  Vitals:   03/19/20 1300  TempSrc:   PainSc: 0-No pain                 Lateef Juncaj L Riya Huxford

## 2020-03-19 NOTE — Anesthesia Procedure Notes (Signed)
Procedure Name: Intubation Date/Time: 03/19/2020 11:47 AM Performed by: [place] Masato Pettie C, CRNA Pre-anesthesia Checklist: Patient identified, Emergency Drugs available, Suction available and Patient being monitored Patient Re-evaluated:Patient Re-evaluated prior to induction Oxygen Delivery Method: Circle system utilized Preoxygenation: Pre-oxygenation with 100% oxygen Induction Type: IV induction Ventilation: Mask ventilation without difficulty Laryngoscope Size: Mac and 4 Grade View: Grade I Tube type: Oral Tube size: 8.5 mm Number of attempts: 1 Airway Equipment and Method: Stylet and Oral airway Placement Confirmation: ETT inserted through vocal cords under direct vision,  positive ETCO2 and breath sounds checked- equal and bilateral Secured at: 22 cm Tube secured with: Tape Dental Injury: Teeth and Oropharynx as per pre-operative assessment

## 2020-03-19 NOTE — Transfer of Care (Signed)
Immediate Anesthesia Transfer of Care Note  Patient: Jerome Kelley  Procedure(s) Performed: VIDEO BRONCHOSCOPY WITHOUT FLUORO (N/A ) ENDOBRONCHIAL ULTRASOUND (N/A ) FINE NEEDLE ASPIRATION BRONCHIAL WASHINGS  Patient Location: PACU and Endoscopy Unit  Anesthesia Type:General  Level of Consciousness: awake, alert  and oriented  Airway & Oxygen Therapy: Patient Spontanous Breathing  Post-op Assessment: Report given to RN and Post -op Vital signs reviewed and stable  Post vital signs: Reviewed and stable  Last Vitals:  Vitals Value Taken Time  BP    Temp    Pulse 52 03/19/20 1240  Resp 16 03/19/20 1240  SpO2 100 % 03/19/20 1240  Vitals shown include unvalidated device data.  Last Pain:  Vitals:   03/19/20 1027  TempSrc: Temporal  PainSc: 0-No pain         Complications: No complications documented.

## 2020-03-22 LAB — CULTURE, RESPIRATORY W GRAM STAIN
Culture: NORMAL
Gram Stain: NONE SEEN

## 2020-03-22 LAB — ACID FAST SMEAR (AFB, MYCOBACTERIA): Acid Fast Smear: NEGATIVE

## 2020-03-22 LAB — CYTOLOGY - NON PAP

## 2020-03-23 ENCOUNTER — Encounter (HOSPITAL_COMMUNITY): Payer: Self-pay | Admitting: Pulmonary Disease

## 2020-03-23 ENCOUNTER — Telehealth: Payer: Self-pay | Admitting: Pulmonary Disease

## 2020-03-23 ENCOUNTER — Encounter: Payer: Self-pay | Admitting: *Deleted

## 2020-03-23 NOTE — Progress Notes (Signed)
Reviewed patient's new patient appointment as this navigator was out of the office. Patient had bronch on 03/19/20 for biopsy of lung lesion. Results have been posted showing NSCLC however tissue block was insufficient for further testing. Reviewed with Dr Marin Olp and he needs more tissue to complete work up and determine best treatment approach. He will reach out to Dr Lake Bells for possible repeat bronch.   Will continue to follow for needs.   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

## 2020-03-24 ENCOUNTER — Telehealth: Payer: Self-pay | Admitting: Pulmonary Disease

## 2020-03-24 DIAGNOSIS — Z5181 Encounter for therapeutic drug level monitoring: Secondary | ICD-10-CM

## 2020-03-24 DIAGNOSIS — R599 Enlarged lymph nodes, unspecified: Secondary | ICD-10-CM

## 2020-03-24 DIAGNOSIS — R591 Generalized enlarged lymph nodes: Secondary | ICD-10-CM

## 2020-03-24 NOTE — Telephone Encounter (Signed)
Please schedule the following:  Diagnosis: Mediastinal adenopathy Procedure: EBUS bronchoscopy Anesthesia: General Do you need Fluro?  No Priority: As soon as possible Date: Flexible Alternate Date:   Time: Flexible Location: Flexible Does patient have OSA?  No DM?  No or Latex allergy?  No Medication Restriction: None Anticoagulate/Antiplatelet: No Pre-op Labs Ordered: CBC, CMP, PT/INR, PTT Imaging request:   (If, SuperDimension CT Chest, please have STAT courier sent to [facility] Pulmonary Office [address].)  Please coordinate Pre-op COVID Testing   Note: Patient recently had EBUS bronchoscopy on 8/6 by Dr. Lake Bells, needs another procedure to get more tissue  Please call me if you have any questions

## 2020-03-25 NOTE — Telephone Encounter (Signed)
Order placed for EBUS,  Labs placed  Will route to [facility] to get scheduled., please let patient know they need to get labs drawn when calling about scheduled date  Also see Dr. Ander Slade note at bottom of message

## 2020-03-25 NOTE — Telephone Encounter (Signed)
Pt is aware of covid 03/30/20@8 :15am and Bronch/EBUS 04/02/20@7 :30am @[facility] Joellen Jersey

## 2020-03-25 NOTE — Telephone Encounter (Signed)
aware

## 2020-03-25 NOTE — Telephone Encounter (Signed)
EBUS/BRONCH@[facility] 04/02/20@7 :30AM IF THIS IS OK I WILL CALL PT Joellen Jersey

## 2020-03-25 NOTE — Telephone Encounter (Signed)
I am in the office on the 20th but should be able to get it done in good time to come to the office as long as we do not have any delays

## 2020-03-25 NOTE — Telephone Encounter (Signed)
Okay with me 

## 2020-03-26 ENCOUNTER — Other Ambulatory Visit: Payer: Medicare Other

## 2020-03-26 ENCOUNTER — Encounter: Payer: Self-pay | Admitting: *Deleted

## 2020-03-26 DIAGNOSIS — Z5181 Encounter for therapeutic drug level monitoring: Secondary | ICD-10-CM

## 2020-03-26 DIAGNOSIS — R599 Enlarged lymph nodes, unspecified: Secondary | ICD-10-CM

## 2020-03-26 DIAGNOSIS — R591 Generalized enlarged lymph nodes: Secondary | ICD-10-CM

## 2020-03-26 LAB — COMPREHENSIVE METABOLIC PANEL
ALT: 7 U/L (ref 0–53)
AST: 11 U/L (ref 0–37)
Albumin: 3.7 g/dL (ref 3.5–5.2)
Alkaline Phosphatase: 98 U/L (ref 39–117)
BUN: 13 mg/dL (ref 6–23)
CO2: 27 mEq/L (ref 19–32)
Calcium: 8.6 mg/dL (ref 8.4–10.5)
Chloride: 106 mEq/L (ref 96–112)
Creatinine, Ser: 0.84 mg/dL (ref 0.40–1.50)
GFR: 88.11 mL/min (ref >60.00)
Glucose, Bld: 156 mg/dL — ABNORMAL HIGH (ref 70–99)
Potassium: 4 mEq/L (ref 3.5–5.1)
Sodium: 140 mEq/L (ref 135–145)
Total Bilirubin: 1 mg/dL (ref 0.2–1.2)
Total Protein: 6.2 g/dL (ref 6.0–8.3)

## 2020-03-26 LAB — CBC WITH DIFFERENTIAL/PLATELET
Basophils Absolute: 0 10*3/uL (ref 0.0–0.1)
Basophils Relative: 0.7 % (ref 0.0–3.0)
Eosinophils Absolute: 0.1 10*3/uL (ref 0.0–0.7)
Eosinophils Relative: 2.1 % (ref 0.0–5.0)
HCT: 40.6 % (ref 39.0–52.0)
Hemoglobin: 14.2 g/dL (ref 13.0–17.0)
Lymphocytes Relative: 20.8 % (ref 12.0–46.0)
Lymphs Abs: 1.2 10*3/uL (ref 0.7–4.0)
MCHC: 34.9 g/dL (ref 30.0–36.0)
MCV: 96.5 fl (ref 78.0–100.0)
Monocytes Absolute: 0.5 10*3/uL (ref 0.1–1.0)
Monocytes Relative: 9.4 % (ref 3.0–12.0)
Neutro Abs: 3.8 10*3/uL (ref 1.4–7.7)
Neutrophils Relative %: 67 % (ref 43.0–77.0)
Platelets: 113 10*3/uL — ABNORMAL LOW (ref 150.0–400.0)
RBC: 4.21 Mil/uL — ABNORMAL LOW (ref 4.22–5.81)
RDW: 14.1 % (ref 11.5–15.5)
WBC: 5.7 10*3/uL (ref 4.0–10.5)

## 2020-03-26 LAB — PROTIME-INR
INR: 1.1 ratio — ABNORMAL HIGH (ref 0.8–1.0)
Prothrombin Time: 12.1 s (ref 9.6–13.1)

## 2020-03-26 LAB — APTT: aPTT: 30.7 s (ref 23.4–32.7)

## 2020-03-26 NOTE — Progress Notes (Signed)
Oncology Nurse Navigator Documentation  Oncology Nurse Navigator Flowsheets 03/26/2020  Abnormal Finding Date -  Confirmed Diagnosis Date -  Diagnosis Status -  Navigator Follow Up Date: 04/02/2020  Navigator Follow Up Reason: Surgery  Navigator Location [facility]  Referral Date to RadOnc/MedOnc -  Navigator Encounter Type Appt/Treatment Plan Review;Other:  Patient Visit Type MedOnc  Treatment Phase Pre-Tx/Tx Discussion  Barriers/Navigation Needs -  Education -  Interventions -  Acuity Level 2-Minimal Needs (1-2 Barriers Identified)  Coordination of Care -  Education Method -  Time Spent with Patient 15

## 2020-03-30 ENCOUNTER — Telehealth: Payer: Self-pay | Admitting: Pulmonary Disease

## 2020-03-30 ENCOUNTER — Other Ambulatory Visit (HOSPITAL_COMMUNITY)
Admission: RE | Admit: 2020-03-30 | Discharge: 2020-03-30 | Disposition: A | Discharge status: Home / Self Care | Payer: Medicare Other | Source: Ambulatory Visit | Attending: Pulmonary Disease | Admitting: Pulmonary Disease

## 2020-03-30 DIAGNOSIS — Z20822 Contact with and (suspected) exposure to covid-19: Secondary | ICD-10-CM | POA: Insufficient documentation

## 2020-03-30 DIAGNOSIS — Z01812 Encounter for preprocedural laboratory examination: Secondary | ICD-10-CM | POA: Diagnosis not present

## 2020-03-30 LAB — SARS CORONAVIRUS 2 (TAT 6-24 HRS): SARS Coronavirus 2: NEGATIVE

## 2020-03-31 NOTE — Telephone Encounter (Signed)
Called and spoke with pt letting him know that it could take weeks before sputum culture results would be back and stated to him that we would call him once results are back and he verbalized understanding. Also made sure pt was aware of bronch and asked if he had received a call from admitting and pt stated he had and was told to arrive by 6am. Nothing further needed.

## 2020-04-01 ENCOUNTER — Other Ambulatory Visit: Payer: Self-pay | Admitting: Pulmonary Disease

## 2020-04-01 NOTE — Anesthesia Preprocedure Evaluation (Addendum)
Anesthesia Evaluation  Patient identified by MRN, date of birth, ID band Patient awake    Reviewed: Allergy & Precautions, NPO status , Patient's Chart, lab work & pertinent test results  Airway Mallampati: III  TM Distance: >3 FB Neck ROM: Full    Dental no notable dental hx. (+) Edentulous Upper, Edentulous Lower   Pulmonary neg pulmonary ROS, Current SmokerPatient did not abstain from smoking.,    Pulmonary exam normal breath sounds clear to auscultation       Cardiovascular hypertension, Pt. on medications negative cardio ROS Normal cardiovascular exam Rhythm:Regular Rate:Normal     Neuro/Psych negative neurological ROS  negative psych ROS   GI/Hepatic negative GI ROS, Neg liver ROS,   Endo/Other  negative endocrine ROS  Renal/GU negative Renal ROS  negative genitourinary   Musculoskeletal negative musculoskeletal ROS (+)   Abdominal   Peds  Hematology negative hematology ROS (+)   Anesthesia Other Findings H/o colon CA and lung CA  Reproductive/Obstetrics                             Anesthesia Physical  Anesthesia Plan  ASA: III  Anesthesia Plan: General   Post-op Pain Management:    Induction: Intravenous  PONV Risk Score and Plan: 1 and Midazolam, Dexamethasone and Ondansetron  Airway Management Planned: Oral ETT  Additional Equipment:   Intra-op Plan:   Post-operative Plan: Extubation in OR  Informed Consent: I have reviewed the patients History and Physical, chart, labs and discussed the procedure including the risks, benefits and alternatives for the proposed anesthesia with the patient or authorized representative who has indicated his/her understanding and acceptance.     Dental advisory given  Plan Discussed with: CRNA  Anesthesia Plan Comments:         Anesthesia Quick Evaluation

## 2020-04-02 ENCOUNTER — Ambulatory Visit (HOSPITAL_COMMUNITY): Payer: Medicare Other | Admitting: Anesthesiology

## 2020-04-02 ENCOUNTER — Encounter (HOSPITAL_COMMUNITY)
Admission: RE | Disposition: A | Discharge status: Home / Self Care | Payer: Self-pay | Source: Home / Self Care | Attending: Pulmonary Disease

## 2020-04-02 ENCOUNTER — Ambulatory Visit (HOSPITAL_COMMUNITY)
Admission: RE | Admit: 2020-04-02 | Discharge: 2020-04-02 | Disposition: A | Discharge status: Home / Self Care | Payer: Medicare Other | Attending: Pulmonary Disease | Admitting: Pulmonary Disease

## 2020-04-02 ENCOUNTER — Other Ambulatory Visit: Payer: Self-pay

## 2020-04-02 ENCOUNTER — Encounter (HOSPITAL_COMMUNITY): Payer: Self-pay | Admitting: Pulmonary Disease

## 2020-04-02 DIAGNOSIS — R590 Localized enlarged lymph nodes: Secondary | ICD-10-CM | POA: Diagnosis not present

## 2020-04-02 DIAGNOSIS — F17210 Nicotine dependence, cigarettes, uncomplicated: Secondary | ICD-10-CM | POA: Insufficient documentation

## 2020-04-02 DIAGNOSIS — Z9221 Personal history of antineoplastic chemotherapy: Secondary | ICD-10-CM | POA: Diagnosis not present

## 2020-04-02 DIAGNOSIS — C801 Malignant (primary) neoplasm, unspecified: Secondary | ICD-10-CM | POA: Diagnosis not present

## 2020-04-02 DIAGNOSIS — C771 Secondary and unspecified malignant neoplasm of intrathoracic lymph nodes: Secondary | ICD-10-CM | POA: Diagnosis not present

## 2020-04-02 DIAGNOSIS — R911 Solitary pulmonary nodule: Secondary | ICD-10-CM | POA: Diagnosis not present

## 2020-04-02 DIAGNOSIS — I10 Essential (primary) hypertension: Secondary | ICD-10-CM | POA: Diagnosis not present

## 2020-04-02 DIAGNOSIS — Z85038 Personal history of other malignant neoplasm of large intestine: Secondary | ICD-10-CM | POA: Insufficient documentation

## 2020-04-02 DIAGNOSIS — Z85118 Personal history of other malignant neoplasm of bronchus and lung: Secondary | ICD-10-CM | POA: Diagnosis not present

## 2020-04-02 DIAGNOSIS — C3490 Malignant neoplasm of unspecified part of unspecified bronchus or lung: Secondary | ICD-10-CM | POA: Diagnosis not present

## 2020-04-02 HISTORY — PX: VIDEO BRONCHOSCOPY: SHX5072

## 2020-04-02 HISTORY — PX: FINE NEEDLE ASPIRATION BIOPSY: CATH118315

## 2020-04-02 SURGERY — VIDEO BRONCHOSCOPY WITHOUT FLUORO
Anesthesia: General

## 2020-04-02 MED ORDER — ROCURONIUM BROMIDE 10 MG/ML (PF) SYRINGE
PREFILLED_SYRINGE | INTRAVENOUS | Status: DC | PRN
  Administered 2020-04-02: 50 mg via INTRAVENOUS

## 2020-04-02 MED ORDER — SUGAMMADEX SODIUM 200 MG/2ML IV SOLN
INTRAVENOUS | Status: DC | PRN
  Administered 2020-04-02: 175 mg via INTRAVENOUS

## 2020-04-02 MED ORDER — LACTATED RINGERS IV SOLN
INTRAVENOUS | Status: AC | PRN
  Administered 2020-04-02: 1000 mL via INTRAVENOUS

## 2020-04-02 MED ORDER — FENTANYL CITRATE (PF) 100 MCG/2ML IJ SOLN
INTRAMUSCULAR | Status: AC
  Filled 2020-04-02: qty 2

## 2020-04-02 MED ORDER — FENTANYL CITRATE (PF) 100 MCG/2ML IJ SOLN
INTRAMUSCULAR | Status: DC | PRN
Start: 2020-04-02 — End: 2020-04-02
  Administered 2020-04-02 (×2): 50 ug via INTRAVENOUS

## 2020-04-02 MED ORDER — PHENYLEPHRINE HCL 0.25 % NA SOLN: 1.0000 | Freq: Four times a day (QID) | NASAL | Status: DC | PRN

## 2020-04-02 MED ORDER — ONDANSETRON HCL 4 MG/2ML IJ SOLN
INTRAMUSCULAR | Status: DC | PRN
  Administered 2020-04-02: 4 mg via INTRAVENOUS

## 2020-04-02 MED ORDER — DEXAMETHASONE SODIUM PHOSPHATE 10 MG/ML IJ SOLN
INTRAMUSCULAR | Status: DC | PRN
  Administered 2020-04-02: 5 mg via INTRAVENOUS

## 2020-04-02 MED ORDER — EPHEDRINE SULFATE-NACL 50-0.9 MG/10ML-% IV SOSY
PREFILLED_SYRINGE | INTRAVENOUS | Status: DC | PRN
  Administered 2020-04-02 (×2): 10 mg via INTRAVENOUS

## 2020-04-02 MED ORDER — PROPOFOL 10 MG/ML IV BOLUS
INTRAVENOUS | Status: DC | PRN
  Administered 2020-04-02: 130 mg via INTRAVENOUS

## 2020-04-02 MED ORDER — LIDOCAINE 2% (20 MG/ML) 5 ML SYRINGE
INTRAMUSCULAR | Status: DC | PRN
  Administered 2020-04-02: 100 mg via INTRAVENOUS

## 2020-04-02 NOTE — Transfer of Care (Signed)
Immediate Anesthesia Transfer of Care Note  Patient: Jerome Kelley  Procedure(s) Performed: Procedure(s): VIDEO BRONCHOSCOPY WITH EBUS (N/A) FINE NEEDLE ASPIRATION BIOPSY  Patient Location: PACU  Anesthesia Type:General   Level of Consciousness:  sedated, patient cooperative and responds to stimulation  Airway & Oxygen Therapy:Patient Spontanous Breathing and Patient connected to face mask oxgen  Post-op Assessment:  Report given to PACU RN and Post -op Vital signs reviewed and stable  Post vital signs:  Reviewed and stable  Last Vitals:  Vitals:   04/02/20 0658 04/02/20 0842  BP: (!) 174/58 (!) 190/60  Pulse: (!) 56 61  Resp: 19 15  Temp: 36.7 C 36.6 C  SpO2: 54% 982%    Complications: No apparent anesthesia complications

## 2020-04-02 NOTE — Interval H&P Note (Signed)
History and Physical Interval Note: Has been feeling well since his previous biopsy Biopsies significant for metastatic cancer  Patient does require more tissue samples for molecular testing  Offers no complaints of pain or discomfort today Denies a cough, no mucus production  He had lost weight prior to the previous bronchoscopy  Medical history is unchanged from previous  Physical exam today is unremarkable, stable hemodynamics, clear breath sounds bilaterally, S1-S2 appreciated  Impression: Mediastinal adenopathy Non-small cell lung cancer  Plan: EBUS bronchoscopy for lymph node sampling  Questions were answered Agreeable to proceed with EBUS   04/02/2020 7:29 AM  Jerome Kelley  has presented today for surgery, with the diagnosis of lung mass.  The various methods of treatment have been discussed with the patient and family. After consideration of risks, benefits and other options for treatment, the patient has consented to  Procedure(s): VIDEO BRONCHOSCOPY WITH EBUS (N/A) as a surgical intervention.  The patient's history has been reviewed, patient examined, no change in status, stable for surgery.  I have reviewed the patient's chart and labs.  Questions were answered to the patient's satisfaction.     Terissa Haffey A Kaide Gage

## 2020-04-02 NOTE — Anesthesia Procedure Notes (Signed)
Procedure Name: Intubation Date/Time: 04/02/2020 7:59 AM Performed by: Lavina Hamman, CRNA Pre-anesthesia Checklist: Patient identified, Emergency Drugs available, Suction available, Patient being monitored and Timeout performed Patient Re-evaluated:Patient Re-evaluated prior to induction Oxygen Delivery Method: Circle system utilized Preoxygenation: Pre-oxygenation with 100% oxygen Induction Type: IV induction Ventilation: Mask ventilation without difficulty Laryngoscope Size: Mac and 3 Grade View: Grade I Tube type: Oral Tube size: 9.0 mm Number of attempts: 1 Airway Equipment and Method: Stylet Placement Confirmation: ETT inserted through vocal cords under direct vision,  positive ETCO2,  CO2 detector and breath sounds checked- equal and bilateral Secured at: 21 cm Tube secured with: Tape Dental Injury: Teeth and Oropharynx as per pre-operative assessment  Comments: ATOI, edentulous.

## 2020-04-02 NOTE — Discharge Instructions (Signed)
Call Dr. Ander Slade with any significant concerns  Flexible Bronchoscopy, Care After This sheet gives you information about how to care for yourself after your test. Your doctor may also give you more specific instructions. If you have problems or questions, contact your doctor. Follow these instructions at home:  Driving  Do not drive for 24 hours if you were given a medicine to help you relax (sedative).  Do not drive or use heavy machinery while taking prescription pain medicine.  General instructions   Take over-the-counter and prescription medicines only as told by your doctor.  Return to your normal activities as told. Ask what activities are safe for you.  Do not use any products that have nicotine or tobacco in them. This includes cigarettes and e-cigarettes. If you need help quitting, ask your doctor.  Keep all follow-up visits as told by your doctor. This is important. It is very important if you had a tissue sample (biopsy) taken.  Get help right away if:  You have shortness of breath that gets worse.  You get light-headed.  You feel like you are going to pass out (faint).  You have chest pain.  You cough up: ? More than a little blood. ? More blood than before.   This information is not intended to replace advice given to you by your health care provider. Make sure you discuss any questions you have with your health care provider. Document Revised: 07/13/2017 Document Reviewed: 08/18/2016 Elsevier Patient Education  2020 [employer].

## 2020-04-02 NOTE — Op Note (Signed)
Flexible and EBUS Bronchoscopy Procedure Note  Jerome Kelley  [id number]  1941-07-22  Date:04/02/20  Time:8:31 AM   Provider Performing:Maxine Fredman A Desera Graffeo   Procedure: Flexible bronchoscopy and EBUS Bronchoscopy  Indication(s) Mediastinal adenopathy  Consent Risks of the procedure as well as the alternatives and risks of each were explained to the patient and/or caregiver.  Consent for the procedure was obtained.  Anesthesia General Anesthesia   Time Out Verified patient identification, verified procedure, site/side was marked, verified correct patient position, special equipment/implants available, medications/allergies/relevant history reviewed, required imaging and test results available.   Sterile Technique Usual hand hygiene, masks, gowns, and gloves were used   Procedure Description Diagnostic bronchoscope advanced through endotracheal tube and into airway.  Airways were examined down to subsegmental level with findings noted below.  Following diagnostic evaluation, no significant abnormality noted, no samples taken.  The diagnostic bronchoscope was then removed and the EBUS bronchoscope was advanced into airway with stations 7 biopsied and sent for slide, cell block, and/or culture.  The EBUS bronchoscope was removed after assuring no active bleeding from biopsy site.  Findings: Normal airway Enlarged subcarinal node Needle aspiration of subcarinal node performed   Complications/Tolerance None; patient tolerated the procedure well. Chest X-ray is not needed post procedure.   EBL None   Specimen(s)  needle aspiration of subcarinal node

## 2020-04-02 NOTE — Anesthesia Postprocedure Evaluation (Signed)
Anesthesia Post Note  Patient: Jerome Kelley  Procedure(s) Performed: VIDEO BRONCHOSCOPY WITH EBUS (N/A ) FINE NEEDLE ASPIRATION BIOPSY     Patient location during evaluation: PACU Anesthesia Type: General Level of consciousness: awake and alert Pain management: pain level controlled Vital Signs Assessment: post-procedure vital signs reviewed and stable Respiratory status: spontaneous breathing, nonlabored ventilation and respiratory function stable Cardiovascular status: stable and blood pressure returned to baseline Postop Assessment: no apparent nausea or vomiting Anesthetic complications: no   No complications documented.  Last Vitals:  Vitals:   04/02/20 0850 04/02/20 0901  BP: (!) 174/65 (!) 189/65  Pulse: 60 63  Resp: 15 13  Temp:    SpO2: 98% 96%    Last Pain:  Vitals:   04/02/20 0901  TempSrc:   PainSc: 0-No pain                 Merlinda Frederick

## 2020-04-02 NOTE — Op Note (Signed)
[facility] Cardiopulmonary Patient Name: Jerome Kelley Procedure Date: 04/02/2020 MRN: [id number] Attending MD: Laurin Coder MD, MD Date of Birth: October 20, 1940 CSN: [id number] Age: 79 Admit Type: Outpatient Ethnicity: Not Hispanic or Latino Procedure:             Bronchoscopy Indications:           Mediastinal adenopathy Providers:             Meera Vasco A. Ander Slade MD, MD, Angus Seller, Elspeth Cho Tech., Technician, Ladona Ridgel,                         Technician, Arnoldo Hooker, CRNA Referring MD:           Medicines:             See the Anesthesia note for documentation of the                         administered medications Complications:         No immediate complications Estimated Blood Loss:  Estimated blood loss: none. Procedure:      Pre-Anesthesia Assessment:      - The heart rate, respiratory rate, oxygen saturations, blood pressure,       adequacy of pulmonary ventilation, and response to care were monitored       throughout the procedure.      - The heart rate, respiratory rate, oxygen saturations, blood pressure,       adequacy of pulmonary ventilation, and response to care were monitored       throughout the procedure.      - The anesthesia plan was to use general anesthesia.      After obtaining informed consent, the bronchoscope was passed under       direct vision. Throughout the procedure, the patient's blood pressure,       pulse, and oxygen saturations were monitored continuously. the [id number]       ([id number]) Olympus therapeutic bronchoscope was introduced through the       mouth, via the endotracheal tube (the patient was intubated for the       procedure) and advanced to the tracheobronchial tree of both lungs. the       [id number] ([id number]) Olympus EBUS was introduced through the mouth, via       the endotracheal tube (the patient was intubated for the procedure) and       advanced to the tracheobronchial tree of  both lungs. The procedure was       accomplished without difficulty. The patient tolerated the procedure       well. The procedure was accomplished without difficulty. The patient       tolerated the procedure well. Findings:      The laryngeal mask airway is in good position. The vocal cords appear       normal. The subglottic space is normal. The trachea is of normal       caliber. The carina is sharp. The tracheobronchial tree was examined to       at least the first subsegmental level. Bronchial mucosa and anatomy are       normal; there are no endobronchial lesions, and no secretions.      The scope was withdrawn and replaced  with the EBUS bronchoscope to       accomplish the ultrasound examination.      Lymph Nodes: An endobronchial ultrasound endoscope was utilized to       systematically examine the subcarinal mediastinum (level 7) in order to       assist with fine needle aspiration. Lymph node staging was performed via       endobronchial ultrasound for known non-small cell lung cancer.      - The 7 (subcarinal) node was not measured by EBUS.      Lymph Nodes: A PET scan posittive subcarinal node      Lymph Nodes: An endobronchial ultrasound-guided transbronchial needle       aspiration was performed subcarina node Impression:      - Mediastinal adenopathy      - The airway examination was normal.      - Endobronchial ultrasound was performed.      - Systematic lymph node staging was performed [Staging].      - A transbronchial needle aspiration was performed.      - Preliminary cytology was suggestive [ROSE 1] [1st Site(s)], [ROSE 2]       [2nd Site(s)] [ROSE 3] [3rd Site(s)] (final results are pending).      - Mediastinal adenopathy Moderate Sedation:      An independent trained observer was present and continuously monitored       the patient. Recommendation:      - Await cytology results. Procedure Code(s):      --- Professional ---      [phone], Bronchoscopy, rigid or  flexible, including fluoroscopic guidance,       when performed; with endobronchial ultrasound (EBUS) guided       transtracheal and/or transbronchial sampling (eg,       aspiration[s]/biopsy[ies]), one or two mediastinal and/or hilar lymph       node stations or structures      36468, Bronchoscopy, rigid or flexible, including fluoroscopic guidance,       when performed; with transendoscopic endobronchial ultrasound (EBUS)       during bronchoscopic diagnostic or therapeutic intervention(s) for       peripheral lesion(s) (List separately in addition to code for primary       procedure[s]) Diagnosis Code(s):      --- Professional ---      R59.0, Localized enlarged lymph nodes      R09.89, Other specified symptoms and signs involving the circulatory and       respiratory systems CPT copyright 2019 [employer] Association. All rights reserved. The codes documented in this report are preliminary and upon coder review may  be revised to meet current compliance requirements. Sherrilyn Rist, MD Laurin Coder MD, MD 04/02/2020 8:45:32 AM This report has been signed electronically. Number of Addenda: 0 Scope In: Scope Out:

## 2020-04-09 ENCOUNTER — Telehealth: Payer: Self-pay | Admitting: Pulmonary Disease

## 2020-04-09 NOTE — Telephone Encounter (Signed)
  I did call pathology to ask about the biopsy report, they will look into it and have somebody call me back.  Result is not officially released as of yet

## 2020-04-09 NOTE — Telephone Encounter (Signed)
Spoke with the pt and he is asking for biopsy results  Please advise thanks!

## 2020-04-12 ENCOUNTER — Encounter: Payer: Self-pay | Admitting: *Deleted

## 2020-04-12 ENCOUNTER — Telehealth: Payer: Self-pay | Admitting: Pulmonary Disease

## 2020-04-12 LAB — CYTOLOGY - NON PAP

## 2020-04-12 NOTE — Telephone Encounter (Signed)
Updated spouse about biopsy results  -Adenocarcinoma

## 2020-04-12 NOTE — Progress Notes (Signed)
Oncotype MAP Testing request sent on specimen:  [id number] DOS 04/02/2020  Oncology Nurse Navigator Documentation  Oncology Nurse Navigator Flowsheets 04/12/2020  Abnormal Finding Date -  Confirmed Diagnosis Date -  Diagnosis Status -  Navigator Follow Up Date: -  Navigator Follow Up Reason: -  Navigator Location [facility]  Referral Date to RadOnc/MedOnc -  Navigator Encounter Type Diagnostic Results;Pathology Review  Patient Visit Type MedOnc  Treatment Phase Pre-Tx/Tx Discussion  Barriers/Navigation Needs Coordination of Care;Education  Education -  Interventions Other  Acuity Level 2-Minimal Needs (1-2 Barriers Identified)  Coordination of Care -  Education Method -  Time Spent with Patient 21

## 2020-04-14 NOTE — Telephone Encounter (Signed)
Dr. Ander Slade please advise on patients biopsy results

## 2020-04-16 ENCOUNTER — Encounter: Payer: Self-pay | Admitting: *Deleted

## 2020-04-16 NOTE — Telephone Encounter (Signed)
I have called and spoken to his spouse on 2 occasions about this. Message was also sent to his oncologist about the result  I will try to call him again, all the times that I have called, he was not available

## 2020-04-16 NOTE — Progress Notes (Signed)
Patient with no current f/u appointment. Molecular testing is currently pending and ETA is 04/22/20. Sent message to scheduler to place patient on Dr Antonieta Pert schedule early in the week of 04/26/20.  Oncology Nurse Navigator Documentation  Oncology Nurse Navigator Flowsheets 04/16/2020  Abnormal Finding Date -  Confirmed Diagnosis Date -  Diagnosis Status -  Navigator Follow Up Date: 04/30/2020  Navigator Follow Up Reason: Follow-up Appointment  Navigator Location [facility]  Referral Date to RadOnc/MedOnc -  Navigator Encounter Type Appt/Treatment Plan Review;Pathology Review  Patient Visit Type MedOnc  Treatment Phase Pre-Tx/Tx Discussion  Barriers/Navigation Needs Coordination of Care;Education  Education -  Interventions Coordination of Care;Other  Acuity Level 2-Minimal Needs (1-2 Barriers Identified)  Coordination of Care Appts  Education Method -  Time Spent with Patient 30

## 2020-04-20 DIAGNOSIS — C3490 Malignant neoplasm of unspecified part of unspecified bronchus or lung: Secondary | ICD-10-CM | POA: Diagnosis not present

## 2020-04-21 ENCOUNTER — Other Ambulatory Visit: Payer: Self-pay | Admitting: Hematology & Oncology

## 2020-04-21 LAB — FUNGUS CULTURE WITH STAIN

## 2020-04-21 LAB — FUNGUS CULTURE RESULT

## 2020-04-21 LAB — FUNGAL ORGANISM REFLEX

## 2020-04-24 NOTE — Telephone Encounter (Signed)
Spoke with the pt and he states that he does know about results and nothing further needed

## 2020-04-29 ENCOUNTER — Other Ambulatory Visit: Payer: Self-pay | Admitting: *Deleted

## 2020-04-29 DIAGNOSIS — C3410 Malignant neoplasm of upper lobe, unspecified bronchus or lung: Secondary | ICD-10-CM

## 2020-04-29 DIAGNOSIS — C3411 Malignant neoplasm of upper lobe, right bronchus or lung: Secondary | ICD-10-CM

## 2020-04-30 ENCOUNTER — Other Ambulatory Visit: Payer: Self-pay

## 2020-04-30 ENCOUNTER — Inpatient Hospital Stay (HOSPITAL_BASED_OUTPATIENT_CLINIC_OR_DEPARTMENT_OTHER): Payer: Medicare Other | Admitting: Hematology & Oncology

## 2020-04-30 ENCOUNTER — Other Ambulatory Visit: Payer: Self-pay | Admitting: *Deleted

## 2020-04-30 ENCOUNTER — Encounter: Payer: Self-pay | Admitting: Hematology & Oncology

## 2020-04-30 ENCOUNTER — Inpatient Hospital Stay: Payer: Medicare Other | Attending: Hematology & Oncology

## 2020-04-30 ENCOUNTER — Encounter: Payer: Self-pay | Admitting: *Deleted

## 2020-04-30 ENCOUNTER — Telehealth: Payer: Self-pay | Admitting: Hematology & Oncology

## 2020-04-30 VITALS — BP 155/55 | HR 56 | Temp 97.6°F | Resp 18 | Ht 68.0 in | Wt 152.4 lb

## 2020-04-30 DIAGNOSIS — C3490 Malignant neoplasm of unspecified part of unspecified bronchus or lung: Secondary | ICD-10-CM | POA: Diagnosis not present

## 2020-04-30 DIAGNOSIS — Z85118 Personal history of other malignant neoplasm of bronchus and lung: Secondary | ICD-10-CM | POA: Diagnosis not present

## 2020-04-30 DIAGNOSIS — C3411 Malignant neoplasm of upper lobe, right bronchus or lung: Secondary | ICD-10-CM

## 2020-04-30 DIAGNOSIS — F17290 Nicotine dependence, other tobacco product, uncomplicated: Secondary | ICD-10-CM | POA: Insufficient documentation

## 2020-04-30 DIAGNOSIS — Z79899 Other long term (current) drug therapy: Secondary | ICD-10-CM | POA: Diagnosis not present

## 2020-04-30 DIAGNOSIS — Z5111 Encounter for antineoplastic chemotherapy: Secondary | ICD-10-CM | POA: Insufficient documentation

## 2020-04-30 DIAGNOSIS — C3410 Malignant neoplasm of upper lobe, unspecified bronchus or lung: Secondary | ICD-10-CM

## 2020-04-30 DIAGNOSIS — C342 Malignant neoplasm of middle lobe, bronchus or lung: Secondary | ICD-10-CM | POA: Insufficient documentation

## 2020-04-30 HISTORY — DX: Malignant neoplasm of upper lobe, right bronchus or lung: C34.11

## 2020-04-30 LAB — CBC WITH DIFFERENTIAL (CANCER CENTER ONLY)
Abs Immature Granulocytes: 0.04 10*3/uL (ref 0.00–0.07)
Basophils Absolute: 0 10*3/uL (ref 0.0–0.1)
Basophils Relative: 0 %
Eosinophils Absolute: 0.2 10*3/uL (ref 0.0–0.5)
Eosinophils Relative: 2 %
HCT: 40.9 % (ref 39.0–52.0)
Hemoglobin: 14.2 g/dL (ref 13.0–17.0)
Immature Granulocytes: 1 %
Lymphocytes Relative: 23 %
Lymphs Abs: 1.5 10*3/uL (ref 0.7–4.0)
MCH: 33.3 pg (ref 26.0–34.0)
MCHC: 34.7 g/dL (ref 30.0–36.0)
MCV: 96 fL (ref 80.0–100.0)
Monocytes Absolute: 0.8 10*3/uL (ref 0.1–1.0)
Monocytes Relative: 12 %
Neutro Abs: 4.2 10*3/uL (ref 1.7–7.7)
Neutrophils Relative %: 62 %
Platelet Count: 129 10*3/uL — ABNORMAL LOW (ref 150–400)
RBC: 4.26 MIL/uL (ref 4.22–5.81)
RDW: 13.6 % (ref 11.5–15.5)
WBC Count: 6.7 10*3/uL (ref 4.0–10.5)
nRBC: 0 % (ref 0.0–0.2)

## 2020-04-30 LAB — CMP (CANCER CENTER ONLY)
ALT: 7 U/L (ref 0–44)
AST: 11 U/L — ABNORMAL LOW (ref 15–41)
Albumin: 3.9 g/dL (ref 3.5–5.0)
Alkaline Phosphatase: 110 U/L (ref 38–126)
Anion gap: 5 (ref 5–15)
BUN: 12 mg/dL (ref 8–23)
CO2: 30 mmol/L (ref 22–32)
Calcium: 9.2 mg/dL (ref 8.9–10.3)
Chloride: 107 mmol/L (ref 98–111)
Creatinine: 0.8 mg/dL (ref 0.61–1.24)
GFR, Est AFR Am: >60 mL/min (ref >60)
GFR, Estimated: >60 mL/min (ref >60)
Glucose, Bld: 100 mg/dL — ABNORMAL HIGH (ref 70–99)
Potassium: 3.7 mmol/L (ref 3.5–5.1)
Sodium: 142 mmol/L (ref 135–145)
Total Bilirubin: 0.9 mg/dL (ref 0.3–1.2)
Total Protein: 6.5 g/dL (ref 6.5–8.1)

## 2020-04-30 MED ORDER — ONDANSETRON HCL 8 MG PO TABS: 8.0000 mg | ORAL_TABLET | Freq: Two times a day (BID) | ORAL | 1 refills | Status: DC | PRN

## 2020-04-30 MED ORDER — DEXAMETHASONE 4 MG PO TABS: ORAL_TABLET | ORAL | 1 refills | Status: DC

## 2020-04-30 MED ORDER — PROCHLORPERAZINE MALEATE 10 MG PO TABS: 10.0000 mg | ORAL_TABLET | Freq: Four times a day (QID) | ORAL | 1 refills | Status: DC | PRN

## 2020-04-30 MED ORDER — LIDOCAINE-PRILOCAINE 2.5-2.5 % EX CREA: TOPICAL_CREAM | CUTANEOUS | 3 refills | Status: DC

## 2020-04-30 MED ORDER — FOLIC ACID 1 MG PO TABS: 1.0000 mg | ORAL_TABLET | Freq: Every day | ORAL | 3 refills | Status: DC

## 2020-04-30 NOTE — Telephone Encounter (Signed)
Called and spoke with wife regarding appointments for [facility] [employer]. Instructions were given to her as well/ per 9/17 los. Roselyn Reef is working on getting MRI scheduled for next week

## 2020-04-30 NOTE — Progress Notes (Signed)
Hematology and Oncology Follow Up Visit  Jerome Kelley [id number] 09/24/1940 79 y.o. 04/30/2020   Principle Diagnosis:   Stage IIIB (T0WI0X7) adenocarcinoma of the right middle lung  History of stage Ia (T1aN0M0) adenocarcinoma the right lung-resected in March 2011  History of stage II adenocarcinoma of the colon-high risk-April 2010  Current Therapy:    Carboplatin/Alimta/Pembrolizumab - start cycle #1 on 05/10/2020     Interim History:  Jerome Kelley is back for follow-up. We finally have the diagnosis. He had to have 2 proctoscopy is done. The second bronchoscopy, which was done on 04/02/2020 did show that he had adenocarcinoma. The pathology report ([id number]) showed adeno carcinoma.  I really believe this is a new primary lung cancer. Jerome Kelley had a PET scan done in. He has multiple mediastinal and hilar lymph nodes that are positive. He has a right supraclavicular lymph node that is positive. He has the 9 mm right middle lobe nodule that is positive. There is no disease elsewhere.  I would have to say that he has stage IIIB (D5HG9J2) adenocarcinoma of the right lung.  I does do not think that he is a good candidate for combination radiation chemotherapy. We might be able to do sequential chemotherapy and radiation therapy.  We did do molecular studies. The molecular studies show that there was a high TMB level. There was no targeted mutations that we found. He did have a unusual BRAF mutation.  I think that he would be a very good candidate for initial chemo immunotherapy. I think he would be able to tolerate this.  He will also need to have an MRI of the brain. I would not think that he would have any brain metastasis.  He is eating pretty well right now. He is having no nausea or vomiting.   Unfortunately, he is still smoking. He will not give up smoking.  I would have to say that his performance status is probably ECOG 1-2.  Medications:  Current Outpatient Medications:  .   aspirin 81 MG chewable tablet, Chew 1 tablet (81 mg total) by mouth 2 (two) times daily., Disp: 35 tablet, Rfl: 0 .  benazepril (LOTENSIN) 20 MG tablet, Take 20 mg by mouth daily., Disp: , Rfl:  .  vitamin B-12 (CYANOCOBALAMIN) 1000 MCG tablet, Take 1 tablet (1,000 mcg total) by mouth daily., Disp: 30 tablet, Rfl: 0 .  dexamethasone (DECADRON) 4 MG tablet, Take 1 tab two times a day the day before Alimta chemo, then take 2 tabs once a day for 3 days starting the day after cisplatin., Disp: 30 tablet, Rfl: 1 .  folic acid (FOLVITE) 1 MG tablet, Take 1 tablet (1 mg total) by mouth daily. Start 5-7 days before Alimta chemotherapy. Continue until 21 days after Alimta completed., Disp: 100 tablet, Rfl: 3 .  lidocaine-prilocaine (EMLA) cream, Apply to affected area once, Disp: 30 g, Rfl: 3 .  ondansetron (ZOFRAN) 8 MG tablet, Take 1 tablet (8 mg total) by mouth 2 (two) times daily as needed (Nausea or vomiting). Start if needed on the third day after cisplatin., Disp: 30 tablet, Rfl: 1 .  prochlorperazine (COMPAZINE) 10 MG tablet, Take 1 tablet (10 mg total) by mouth every 6 (six) hours as needed (Nausea or vomiting)., Disp: 30 tablet, Rfl: 1  Allergies: No Known Allergies  Past Medical History, Surgical history, Social history, and Family History were reviewed and updated.  Review of Systems: Review of Systems  Constitutional: Negative.   HENT:  Negative.   Eyes:  Negative.   Respiratory: Negative.   Cardiovascular: Negative.   Gastrointestinal: Negative.   Endocrine: Negative.   Genitourinary: Negative.    Musculoskeletal: Negative.   Skin: Negative.   Neurological: Negative.   Hematological: Negative.   Psychiatric/Behavioral: Negative.     Physical Exam:  height is '5\' 8"'  (1.727 m) and weight is 152 lb 6.4 oz (69.1 kg). His oral temperature is 97.6 F (36.4 C). His blood pressure is 155/55 (abnormal) and his pulse is 56 (abnormal). His respiration is 18 and oxygen saturation is 100%.    Wt Readings from Last 3 Encounters:  04/30/20 152 lb 6.4 oz (69.1 kg)  04/02/20 170 lb (77.1 kg)  03/19/20 155 lb (70.3 kg)    Physical Exam Vitals reviewed.  HENT:     Head: Normocephalic and atraumatic.  Eyes:     Pupils: Pupils are equal, round, and reactive to light.  Cardiovascular:     Rate and Rhythm: Normal rate and regular rhythm.     Heart sounds: Normal heart sounds.  Pulmonary:     Effort: Pulmonary effort is normal.     Breath sounds: Normal breath sounds.  Abdominal:     General: Bowel sounds are normal.     Palpations: Abdomen is soft.  Musculoskeletal:        General: No tenderness or deformity. Normal range of motion.     Cervical back: Normal range of motion.  Lymphadenopathy:     Cervical: No cervical adenopathy.  Skin:    General: Skin is warm and dry.     Findings: No erythema or rash.  Neurological:     Mental Status: He is alert and oriented to person, place, and time.  Psychiatric:        Behavior: Behavior normal.        Thought Content: Thought content normal.        Judgment: Judgment normal.    Lab Results  Component Value Date   WBC 6.7 04/30/2020   HGB 14.2 04/30/2020   HCT 40.9 04/30/2020   MCV 96.0 04/30/2020   PLT 129 (L) 04/30/2020     Chemistry      Component Value Date/Time   NA 142 04/30/2020 1153   NA 140 06/28/2011 0950   K 3.7 04/30/2020 1153   K 4.7 06/28/2011 0950   CL 107 04/30/2020 1153   CL 100 06/28/2011 0950   CO2 30 04/30/2020 1153   CO2 29 06/28/2011 0950   BUN 12 04/30/2020 1153   BUN 10 06/28/2011 0950   CREATININE 0.80 04/30/2020 1153   CREATININE 0.7 06/28/2011 0950      Component Value Date/Time   CALCIUM 9.2 04/30/2020 1153   CALCIUM 9.0 06/28/2011 0950   ALKPHOS 110 04/30/2020 1153   ALKPHOS 110 (H) 12/22/2010 0850   AST 11 (L) 04/30/2020 1153   ALT 7 04/30/2020 1153   ALT 18 12/22/2010 0850   BILITOT 0.9 04/30/2020 1153      Impression and Plan: Jerome Kelley is a very nice 79 year old  white male. I have known him for many years. He now has a new lung cancer. He had a previous lung cancer also of the right lung back 10 years ago. I cannot imagine that this has recurred in this manner.  Ideally, we would do concurrent radiation and chemotherapy on him. I just do not think that he would tolerate this at all.  As such, we will see about maybe sequential therapy I will set him up with  initial chemotherapy. He does have relatively high burden thoracic disease.  I think that he would do okay with carboplatinum/Alimta/pembrolizumab. I would give 3 initial cycles. I would then repeat his PET scan. I would like to think that he will respond.  Ideally, I would probably give 6 cycles of treatment. If he is doing well, then we might consider radiation therapy at that point.  He would be a candidate for maintenance immunotherapy.  I spent a good hour with he and his wife. I really wish he would stop smoking but he has made it clear that this is not going to happen.  He will need to have a Port-A-Cath placed. I will see about having this done next week.  We will try to get started with treatment the last week of September.  I will plan to see him back the second cycle of treatment in October.   [name], MD 9/17/20215:17 PM

## 2020-04-30 NOTE — Progress Notes (Signed)
START OFF PATHWAY REGIMEN - Non-Small Cell Lung   [id number]:Pembrolizumab 200 mg  IV D1 + Pemetrexed 500 mg/m2 IV D1 + Carboplatin AUC=5 IV D1 q21 Days:   A cycle is every 21 days:     Pembrolizumab      Pemetrexed      Carboplatin   **Always confirm dose/schedule in your pharmacy ordering system**  Patient Characteristics: Preoperative or Nonsurgical Candidate (Clinical Staging), Stage III - Nonsurgical Candidate (Nonsquamous and Squamous), PS = 0, 1 Therapeutic Status: Preoperative or Nonsurgical Candidate (Clinical Staging) AJCC T Category: cT1c AJCC N Category: cN3 AJCC M Category: cM0 AJCC 8 Stage Grouping: IIIB ECOG Performance Status: 1 Intent of Therapy: Non-Curative / Palliative Intent, Discussed with Patient

## 2020-04-30 NOTE — Progress Notes (Signed)
Patient seen by MD today and treatment plan made. Patient needs a port, chemo education, and chemo start all scheduled and reviewed with wife. MD also requests MRI but order has not yet been placed. Per Baxter Flattery, Gaffer, this does not require a PA. Will schedule when order placed.  Oncology Nurse Navigator Documentation  Oncology Nurse Navigator Flowsheets 04/30/2020  Abnormal Finding Date -  Confirmed Diagnosis Date -  Diagnosis Status -  Navigator Follow Up Date: 05/04/2020  Navigator Follow Up Reason: Chemo Class  Navigator Location [facility]  Referral Date to RadOnc/MedOnc -  Navigator Encounter Type Appt/Treatment Plan Review  Patient Visit Type MedOnc  Treatment Phase Post-Tx Follow-up  Barriers/Navigation Needs Coordination of Care;Education  Education -  Interventions Coordination of Care;Referrals  Acuity Level 2-Minimal Needs (1-2 Barriers Identified)  Referrals Radiation  Coordination of Care Appts;Pathology  Education Method -  Time Spent with Patient 30

## 2020-05-03 ENCOUNTER — Other Ambulatory Visit: Payer: Self-pay | Admitting: *Deleted

## 2020-05-03 NOTE — Progress Notes (Signed)
Pharmacist Chemotherapy Monitoring - Initial Assessment    Anticipated start date: 05/10/20  Regimen:  . Are orders appropriate based on the patient's diagnosis, regimen, and cycle? Yes . Does the plan date match the patient's scheduled date? Yes . Is the sequencing of drugs appropriate? Yes . Are the premedications appropriate for the patient's regimen? Yes . Prior Authorization for treatment is: Approved o If applicable, is the correct biosimilar selected based on the patient's insurance? not applicable  Organ Function and Labs: Marland Kitchen Are dose adjustments needed based on the patient's renal function, hepatic function, or hematologic function? No . Are appropriate labs ordered prior to the start of patient's treatment? Yes . Other organ system assessment, if indicated: N/A . The following baseline labs, if indicated, have been ordered: pembrolizumab: baseline TSH +/- T4  Dose Assessment: . Are the drug doses appropriate? Yes . Are the following correct: o Drug concentrations Yes o IV fluid compatible with drug Yes o Administration routes Yes o Timing of therapy Yes . If applicable, does the patient have documented access for treatment and/or plans for port-a-cath placement? yes . If applicable, have lifetime cumulative doses been properly documented and assessed? not applicable Lifetime Dose Tracking  No doses have been documented on this patient for the following tracked chemicals: Doxorubicin, Epirubicin, Idarubicin, Daunorubicin, Mitoxantrone, Bleomycin, Oxaliplatin, Carboplatin, Liposomal Doxorubicin  o   Toxicity Monitoring/Prevention: . The patient has the following take home antiemetics prescribed: Ondansetron/prochlorperazine/lorazepam . The patient has the following take home medications prescribed: B12 for pemetrexed and folic acid for pemetrexed . Medication allergies and previous infusion related reactions, if applicable, have been reviewed and addressed. Yes . The  patient's current medication list has been assessed for drug-drug interactions with their chemotherapy regimen. no significant drug-drug interactions were identified on review.  Order Review: . Are the treatment plan orders signed? No . Is the patient scheduled to see a provider prior to their treatment? No  I verify that I have reviewed each item in the above checklist and answered each question accordingly.  Mykal Batiz, Jacqlyn Larsen 05/03/2020 12:39 PM

## 2020-05-04 ENCOUNTER — Inpatient Hospital Stay: Payer: Medicare Other

## 2020-05-04 ENCOUNTER — Other Ambulatory Visit: Payer: Self-pay

## 2020-05-04 ENCOUNTER — Encounter: Payer: Self-pay | Admitting: *Deleted

## 2020-05-04 LAB — ACID FAST CULTURE WITH REFLEXED SENSITIVITIES (MYCOBACTERIA): Acid Fast Culture: NEGATIVE

## 2020-05-04 NOTE — Progress Notes (Signed)
Patient in chemotherapy education class with  Wife Enid Derry.  Discussed side effects of  Keytruda, Alimta and Carboplatin which include but are not limited to myelosuppression, decreased appetite, fatigue, fever, allergic or infusional reaction, mucositis, cardiac toxicity, cough, SOB, altered taste, nausea and vomiting, diarrhea, constipation, elevated LFTs myalgia and arthralgias, hair loss or thinning, rash, skin dryness, nail changes, peripheral neuropathy, discolored urine, delayed wound healing, mental changes (Chemo brain), increased risk of infections, weight loss.  Reviewed infusion room and office policy and procedure.  Reviewed when to call the office with any concerns or problems.  Scientist, clinical (histocompatibility and immunogenetics) given.  Discussed portacath insertion and EMLA cream administration.  Antiemetic protocol and chemotherapy schedule reviewed. Patient verbalized understanding of chemotherapy indications and possible side effects.  Teachback done

## 2020-05-06 ENCOUNTER — Ambulatory Visit (HOSPITAL_COMMUNITY)
Admission: RE | Admit: 2020-05-06 | Discharge: 2020-05-06 | Disposition: A | Discharge status: Home / Self Care | Payer: Medicare Other | Source: Ambulatory Visit | Attending: Hematology & Oncology | Admitting: Hematology & Oncology

## 2020-05-06 ENCOUNTER — Other Ambulatory Visit: Payer: Self-pay | Admitting: Radiology

## 2020-05-06 ENCOUNTER — Telehealth: Payer: Self-pay

## 2020-05-06 ENCOUNTER — Other Ambulatory Visit: Payer: Self-pay

## 2020-05-06 ENCOUNTER — Encounter: Payer: Self-pay | Admitting: *Deleted

## 2020-05-06 DIAGNOSIS — C3490 Malignant neoplasm of unspecified part of unspecified bronchus or lung: Secondary | ICD-10-CM | POA: Insufficient documentation

## 2020-05-06 DIAGNOSIS — I6782 Cerebral ischemia: Secondary | ICD-10-CM | POA: Diagnosis not present

## 2020-05-06 DIAGNOSIS — G319 Degenerative disease of nervous system, unspecified: Secondary | ICD-10-CM | POA: Diagnosis not present

## 2020-05-06 DIAGNOSIS — J0120 Acute ethmoidal sinusitis, unspecified: Secondary | ICD-10-CM | POA: Diagnosis not present

## 2020-05-06 DIAGNOSIS — J322 Chronic ethmoidal sinusitis: Secondary | ICD-10-CM | POA: Diagnosis not present

## 2020-05-06 IMAGING — MR MR HEAD WO/W CM
16 series · 48 of 48 positions shown · IV contrast (gadavist)
Comparison: No pertinent prior exams are available for comparison.

CLINICAL DATA: Malignant neoplasm of unspecified part of
unspecified bronchus or lung. Non-small cell lung cancer, staging;
new diagnosis of stage IIIB NSCLC.

EXAM:
MRI HEAD WITHOUT AND WITH CONTRAST
TECHNIQUE: Multiplanar, multiecho pulse sequences of the brain and surrounding
structures were obtained without and with intravenous contrast.
CONTRAST:  7mL GADAVIST GADOBUTROL 1 MMOL/ML IV SOLN

[Series 9: DWI · axial · 3.0mm · 1.31mm/px · z∈[+17,+178]mm · 4 of 111 slices shown (1 of 4)]
[im 1/111]
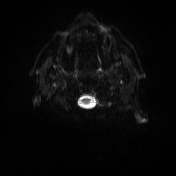
[im 37/111]
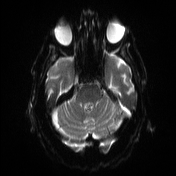
[im 74/111]
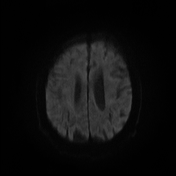
[im 111/111]
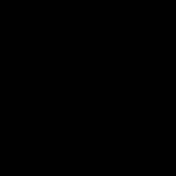

[Series 10: DWI · axial · 3.0mm · 1.31mm/px · z∈[+17,+170]mm · 2 of 53 slices shown (2 of 4)]
[im 1/53]
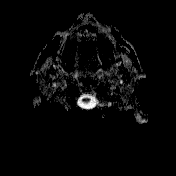
[im 53/53]
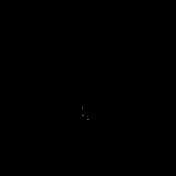

[Series 11: mip_images(sw) · axial · 24.0mm · 0.72mm/px · z∈[+29,+169]mm · 2 of 49 slices shown]
[im 1/49]
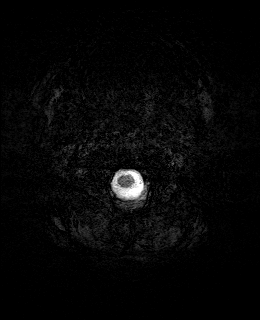
[im 49/49]
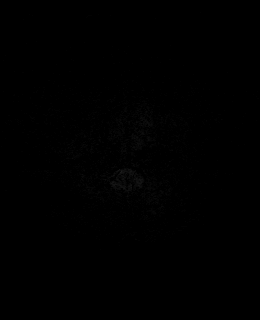

[Series 12: swi_images · axial · 3.0mm · 0.72mm/px · z∈[+18,+180]mm · 2 of 56 slices shown]
[im 1/56]
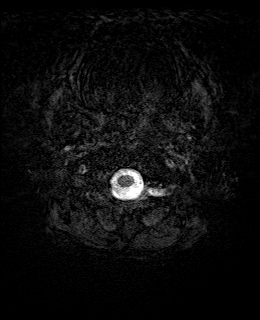
[im 56/56]
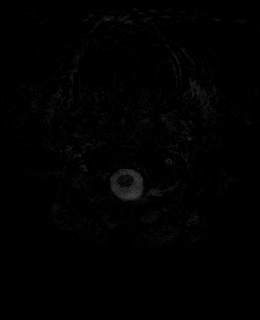

[Series 13: FLAIR · axial · 3.0mm · 0.72mm/px · z∈[+19,+180]mm · 2 of 56 slices shown]
[im 1/56]
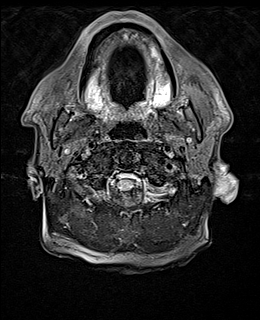
[im 56/56]
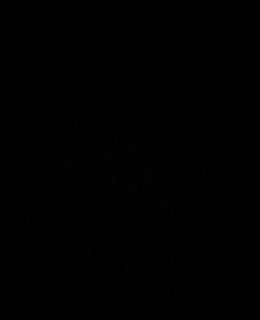

[Series 14: T1 · sagittal · 5.0mm · 0.75mm/px · 1 of 25 slices shown (1 of 2)]
[im 1/25]
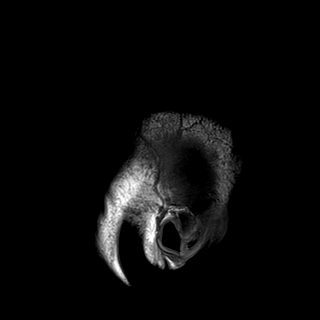

[Series 15: T2 · axial · 5.0mm · 0.60mm/px · 1 of 26 slices shown]
[im 1/26]
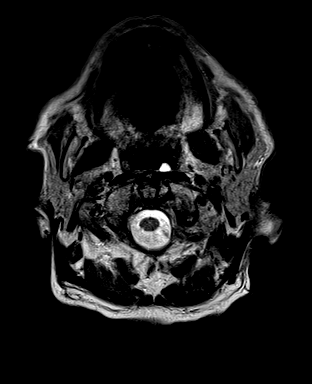

[Series 16: T1 · axial · 1.0mm · 0.90mm/px · z∈[+14,+185]mm · 7 of 176 slices shown (2 of 2)]
[im 1/176]
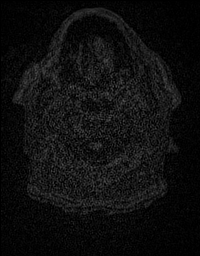
[im 30/176]
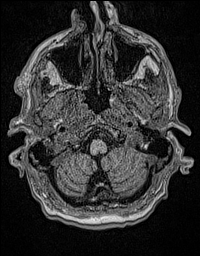
[im 59/176]
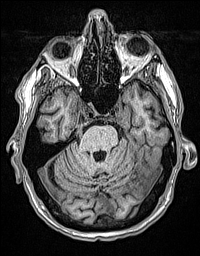
[im 88/176]
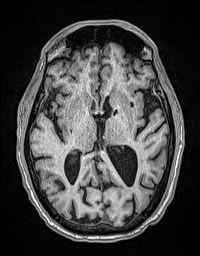
[im 117/176]
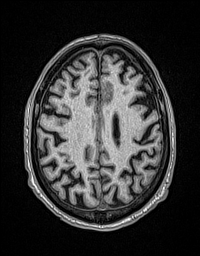
[im 146/176]
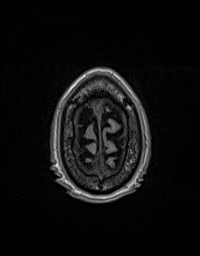
[im 176/176]
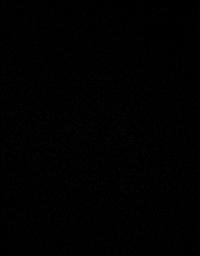

[Series 17: DWI · coronal · 5.0mm · 1.31mm/px · 3 of 76 slices shown (3 of 4)]
[im 1/76]
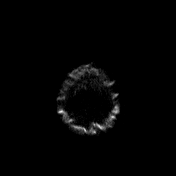
[im 38/76]
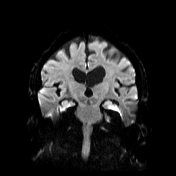
[im 76/76]
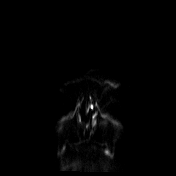

[Series 18: DWI · coronal · 5.0mm · 1.31mm/px · 1 of 38 slices shown (4 of 4)]
[im 1/38]
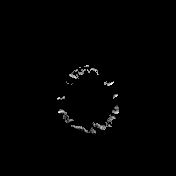

[Series 19: T2 post-contrast · coronal · 5.0mm · 0.57mm/px · 1 of 31 slices shown]
[im 1/31]
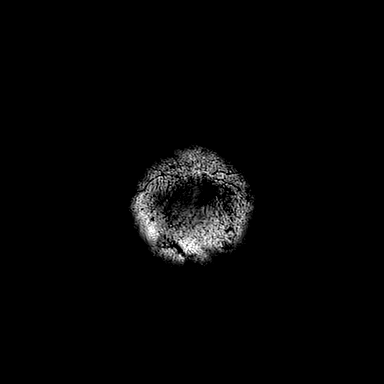

[Series 20: T1 post-contrast · axial · 1.0mm · 0.90mm/px · z∈[+14,+185]mm · 7 of 176 slices shown (1 of 3)]
[im 1/176]
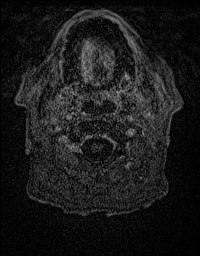
[im 30/176]
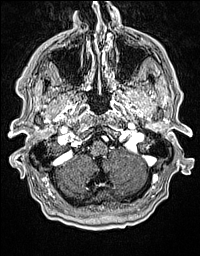
[im 59/176]
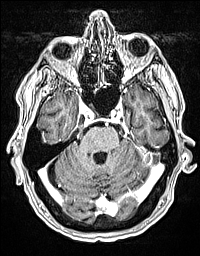
[im 88/176]
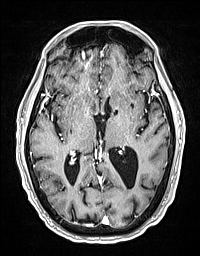
[im 117/176]
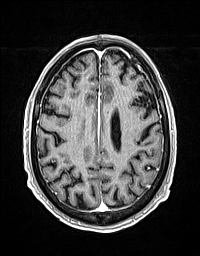
[im 146/176]
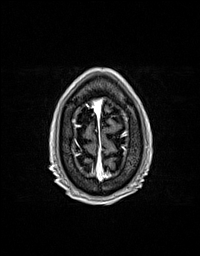
[im 176/176]
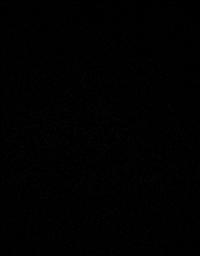

[Series 21: T1 post-contrast · coronal · 5.0mm · 0.43mm/px · 1 of 31 slices shown (2 of 3)]
[im 1/31]
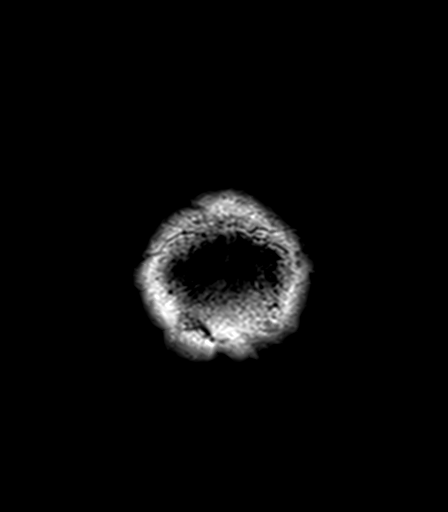

[Series 22: T1 post-contrast · sagittal · 5.0mm · 0.75mm/px · 1 of 25 slices shown (3 of 3)]
[im 1/25]
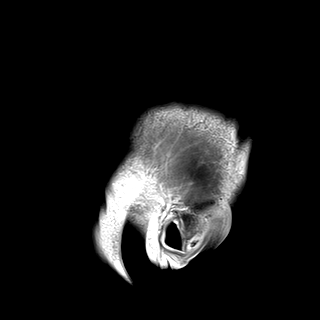

[Series 100: <mpr range> · axial · 1.0mm · 0.48mm/px · z∈[+99,+219]mm · 7 of 177 slices shown]
[im 1/177]
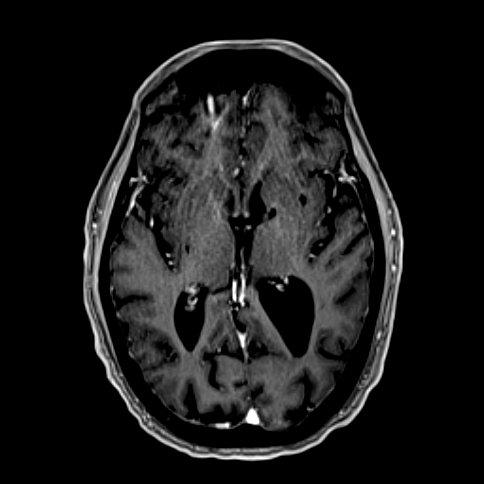
[im 30/177]
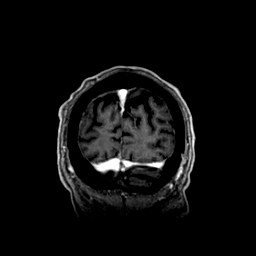
[im 59/177]
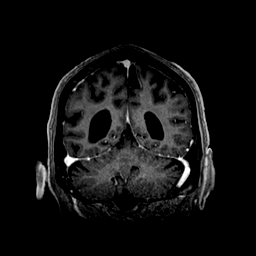
[im 89/177]
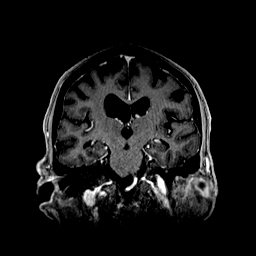
[im 118/177]
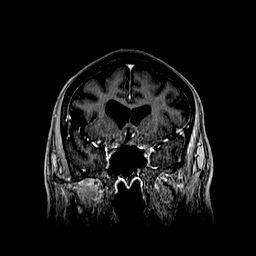
[im 147/177]
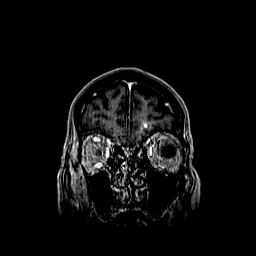
[im 177/177]
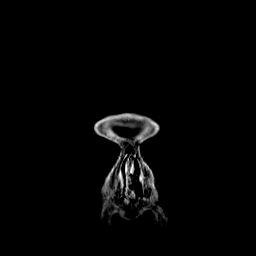

[Series 101: <mpr range(1)> · axial · 1.0mm · 0.48mm/px · z∈[+99,+222]mm · 6 of 161 slices shown]
[im 1/161]
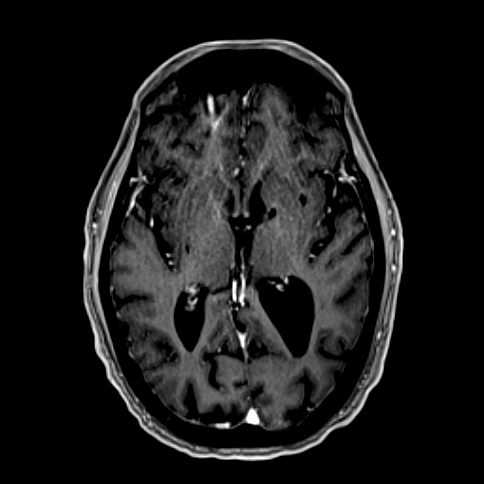
[im 33/161]
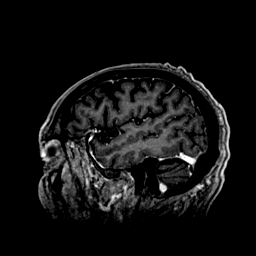
[im 65/161]
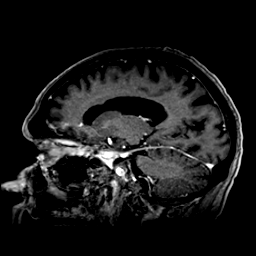
[im 97/161]
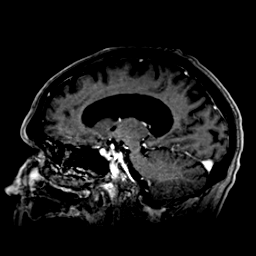
[im 129/161]
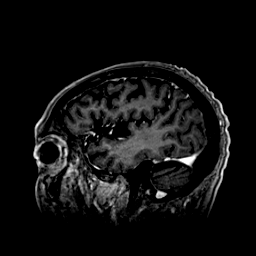
[im 161/161]
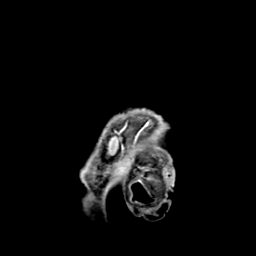

[48 of 48 positions shown; findings below may reference images not displayed]

FINDINGS: Brain:

The axial diffusion-weighted sequence is moderately motion degraded.

Mild generalized parenchymal atrophy.

No abnormal enhancement is demonstrated to suggest intracranial
metastatic disease.

Mild multifocal T2/FLAIR hyperintensity within the cerebral white
matter is nonspecific, but consistent with chronic small vessel
ischemic disease.

Prominent perivascular spaces within the basal ganglia bilaterally.

There is no acute infarct.

No evidence of intracranial mass.

No chronic intracranial blood products.

No extra-axial fluid collection.

No midline shift.

Vascular: Expected proximal arterial flow voids. Incidentally noted
right frontal lobe developmental venous anomaly, an anatomic variant
(for instance as seen on series 20, images 89-90).

Skull and upper cervical spine: No focal marrow lesion.

Sinuses/Orbits: Bilateral lens replacements. Mild ethmoid sinus
mucosal thickening. No significant mastoid effusion.

Other: Small mucous retention cyst versus secretions within the
posterior left pharynx.
IMPRESSION: No evidence of intracranial metastatic disease.

Mild generalized atrophy of the brain and chronic small vessel
ischemic changes.

## 2020-05-06 MED ORDER — GADOBUTROL 1 MMOL/ML IV SOLN
7.0000 mL | Freq: Once | INTRAVENOUS | Status: AC | PRN
  Administered 2020-05-06: 7 mL via INTRAVENOUS

## 2020-05-06 NOTE — Progress Notes (Signed)
Oncology Nurse Navigator Documentation  Oncology Nurse Navigator Flowsheets 05/06/2020  Abnormal Finding Date -  Confirmed Diagnosis Date -  Diagnosis Status -  Navigator Follow Up Date: 05/10/2020  Navigator Follow Up Reason: Chemotherapy  Navigator Location [facility]  Referral Date to RadOnc/MedOnc -  Navigator Encounter Type Scan Review  Patient Visit Type MedOnc  Treatment Phase Pre-Tx/Tx Discussion  Barriers/Navigation Needs Coordination of Care;Education  Education -  Interventions None Required  Acuity Level 2-Minimal Needs (1-2 Barriers Identified)  Referrals -  Coordination of Care -  Education Method -  Time Spent with Patient 15

## 2020-05-06 NOTE — Telephone Encounter (Addendum)
Pt's wife notified by phone of attached message. Shirley verbalizes understanding and appreciation. dph  ----- Message from [name], MD sent at 05/06/2020  2:55 PM EDT ----- Call - the MRI of the brain is perfect!!  Laurey Arrow

## 2020-05-07 ENCOUNTER — Ambulatory Visit (HOSPITAL_COMMUNITY)
Admission: RE | Admit: 2020-05-07 | Discharge: 2020-05-07 | Disposition: A | Discharge status: Home / Self Care | Payer: Medicare Other | Source: Ambulatory Visit | Attending: Hematology & Oncology | Admitting: Hematology & Oncology

## 2020-05-07 ENCOUNTER — Encounter (HOSPITAL_COMMUNITY): Payer: Self-pay

## 2020-05-07 ENCOUNTER — Other Ambulatory Visit: Payer: Self-pay | Admitting: Hematology & Oncology

## 2020-05-07 DIAGNOSIS — Z85038 Personal history of other malignant neoplasm of large intestine: Secondary | ICD-10-CM | POA: Insufficient documentation

## 2020-05-07 DIAGNOSIS — I10 Essential (primary) hypertension: Secondary | ICD-10-CM | POA: Diagnosis not present

## 2020-05-07 DIAGNOSIS — Z79899 Other long term (current) drug therapy: Secondary | ICD-10-CM | POA: Insufficient documentation

## 2020-05-07 DIAGNOSIS — C3411 Malignant neoplasm of upper lobe, right bronchus or lung: Secondary | ICD-10-CM

## 2020-05-07 DIAGNOSIS — Z9221 Personal history of antineoplastic chemotherapy: Secondary | ICD-10-CM | POA: Insufficient documentation

## 2020-05-07 DIAGNOSIS — Z7982 Long term (current) use of aspirin: Secondary | ICD-10-CM | POA: Insufficient documentation

## 2020-05-07 DIAGNOSIS — C3491 Malignant neoplasm of unspecified part of right bronchus or lung: Secondary | ICD-10-CM | POA: Diagnosis not present

## 2020-05-07 DIAGNOSIS — F17210 Nicotine dependence, cigarettes, uncomplicated: Secondary | ICD-10-CM | POA: Insufficient documentation

## 2020-05-07 DIAGNOSIS — Z452 Encounter for adjustment and management of vascular access device: Secondary | ICD-10-CM | POA: Diagnosis not present

## 2020-05-07 HISTORY — PX: IR IMAGING GUIDED PORT INSERTION: IMG5740

## 2020-05-07 LAB — CBC WITH DIFFERENTIAL/PLATELET
Abs Immature Granulocytes: 0.04 10*3/uL (ref 0.00–0.07)
Basophils Absolute: 0 10*3/uL (ref 0.0–0.1)
Basophils Relative: 1 %
Eosinophils Absolute: 0.1 10*3/uL (ref 0.0–0.5)
Eosinophils Relative: 2 %
HCT: 44.8 % (ref 39.0–52.0)
Hemoglobin: 15.2 g/dL (ref 13.0–17.0)
Immature Granulocytes: 1 %
Lymphocytes Relative: 24 %
Lymphs Abs: 1.6 10*3/uL (ref 0.7–4.0)
MCH: 33.7 pg (ref 26.0–34.0)
MCHC: 33.9 g/dL (ref 30.0–36.0)
MCV: 99.3 fL (ref 80.0–100.0)
Monocytes Absolute: 0.8 10*3/uL (ref 0.1–1.0)
Monocytes Relative: 12 %
Neutro Abs: 3.9 10*3/uL (ref 1.7–7.7)
Neutrophils Relative %: 60 %
Platelets: 116 10*3/uL — ABNORMAL LOW (ref 150–400)
RBC: 4.51 MIL/uL (ref 4.22–5.81)
RDW: 14 % (ref 11.5–15.5)
WBC: 6.4 10*3/uL (ref 4.0–10.5)
nRBC: 0 % (ref 0.0–0.2)

## 2020-05-07 IMAGING — US IR IMAGING GUIDED PORT INSERTION
1 series · 1 of 1 positions shown · non-contrast
Comparison: none

CLINICAL DATA: Recurrent lung carcinoma and need for porta cath for
chemotherapy.

[Series 1: (id) · 1 of 1 slices shown]
[im 1/1]
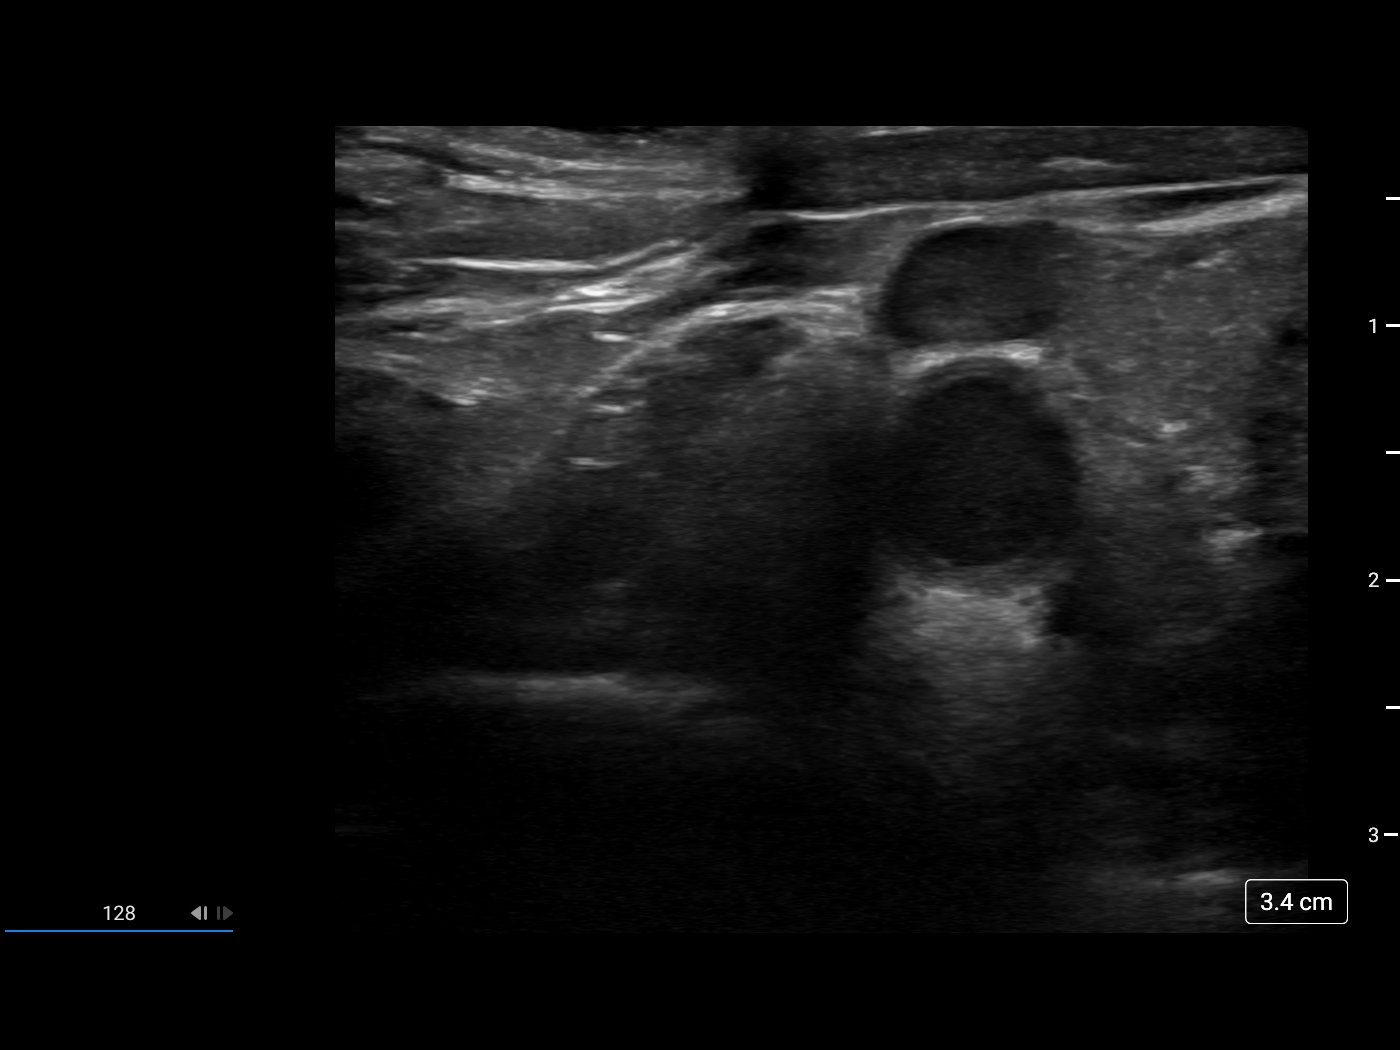

[1 of 1 positions shown; findings below may reference images not displayed]

EXAM:
IMPLANTED PORT A CATH PLACEMENT WITH ULTRASOUND AND FLUOROSCOPIC
GUIDANCE

ANESTHESIA/SEDATION:
2.0 mg IV Versed; 100 mcg IV Fentanyl

Total Moderate Sedation Time:  36 minutes

The patient's level of consciousness and physiologic status were
continuously monitored during the procedure by Radiology nursing.

Additional Medications: 2 g IV Ancef.

FLUOROSCOPY TIME:  18 seconds.  3.0 mGy.

PROCEDURE:
The procedure, risks, benefits, and alternatives were explained to
the patient. Questions regarding the procedure were encouraged and
answered. The patient understands and consents to the procedure. A
time-out was performed prior to initiating the procedure.

Ultrasound was utilized to confirm patency of the right internal
jugular vein. The right neck and chest were prepped with
chlorhexidine in a sterile fashion, and a sterile drape was applied
covering the operative field. Maximum barrier sterile technique with
sterile gowns and gloves were used for the procedure. Local
anesthesia was provided with 1% lidocaine.

After creating a small venotomy incision, a 21 gauge needle was
advanced into the right internal jugular vein under direct,
real-time ultrasound guidance. Ultrasound image documentation was
performed. After securing guidewire access, an 8 Fr dilator was
placed. A J-wire was kinked to measure appropriate catheter length.

A subcutaneous port pocket was then created along the upper chest
wall utilizing sharp and blunt dissection. Portable cautery was
utilized. The pocket was irrigated with sterile saline.

A single lumen power injectable port was chosen for placement. The 8
Fr catheter was tunneled from the port pocket site to the venotomy
incision. The port was placed in the pocket. External catheter was
trimmed to appropriate length based on guidewire measurement.

At the venotomy, an 8 Fr peel-away sheath was placed over a
guidewire. The catheter was then placed through the sheath and the
sheath removed. Final catheter positioning was confirmed and
documented with a fluoroscopic spot image. The port was accessed
with a needle and aspirated and flushed with heparinized saline. The
access needle was removed.

The venotomy and port pocket incisions were closed with subcutaneous
3-0 Monocryl and subcuticular 4-0 Vicryl. Dermabond was applied to
both incisions.

COMPLICATIONS:
COMPLICATIONS
None
FINDINGS: After catheter placement, the tip lies at the YIU KAN junction.
The catheter aspirates normally and is ready for immediate use.
IMPRESSION: Placement of single lumen port a cath via right internal jugular
vein. The catheter tip lies at the YIU KAN junction. A power
injectable port a cath was placed and is ready for immediate use.

## 2020-05-07 MED ORDER — FENTANYL CITRATE (PF) 100 MCG/2ML IJ SOLN
INTRAMUSCULAR | Status: AC
  Filled 2020-05-07: qty 2

## 2020-05-07 MED ORDER — SODIUM CHLORIDE 0.9 % IV SOLN: INTRAVENOUS | Status: DC

## 2020-05-07 MED ORDER — CEFAZOLIN SODIUM-DEXTROSE 2-4 GM/100ML-% IV SOLN: 2.0000 g | INTRAVENOUS | Status: AC

## 2020-05-07 MED ORDER — MIDAZOLAM HCL 2 MG/2ML IJ SOLN
INTRAMUSCULAR | Status: AC
  Filled 2020-05-07: qty 4

## 2020-05-07 MED ORDER — FENTANYL CITRATE (PF) 100 MCG/2ML IJ SOLN
INTRAMUSCULAR | Status: AC | PRN
  Administered 2020-05-07 (×2): 50 ug via INTRAVENOUS

## 2020-05-07 MED ORDER — LIDOCAINE HCL 1 % IJ SOLN
INTRAMUSCULAR | Status: AC
  Filled 2020-05-07: qty 20

## 2020-05-07 MED ORDER — FENTANYL CITRATE (PF) 100 MCG/2ML IJ SOLN: 25.0000 ug | INTRAMUSCULAR | Status: DC | PRN

## 2020-05-07 MED ORDER — HEPARIN SOD (PORK) LOCK FLUSH 100 UNIT/ML IV SOLN
INTRAVENOUS | Status: AC | PRN
  Administered 2020-05-07: 500 [IU] via INTRAVENOUS

## 2020-05-07 MED ORDER — CEFAZOLIN SODIUM-DEXTROSE 2-4 GM/100ML-% IV SOLN
INTRAVENOUS | Status: AC
  Administered 2020-05-07: 2 g via INTRAVENOUS
  Filled 2020-05-07: qty 100

## 2020-05-07 MED ORDER — ONDANSETRON HCL 4 MG/2ML IJ SOLN: 4.0000 mg | Freq: Once | INTRAMUSCULAR | Status: DC | PRN

## 2020-05-07 MED ORDER — MIDAZOLAM HCL 2 MG/2ML IJ SOLN
INTRAMUSCULAR | Status: AC | PRN
  Administered 2020-05-07 (×2): 1 mg via INTRAVENOUS

## 2020-05-07 MED ORDER — LIDOCAINE HCL (PF) 1 % IJ SOLN
INTRAMUSCULAR | Status: AC | PRN
  Administered 2020-05-07 (×2): 10 mL via INTRADERMAL

## 2020-05-07 MED ORDER — HEPARIN SOD (PORK) LOCK FLUSH 100 UNIT/ML IV SOLN
INTRAVENOUS | Status: AC
  Filled 2020-05-07: qty 5

## 2020-05-07 NOTE — Procedures (Signed)
Interventional Radiology Procedure Note  Procedure: Single Lumen Power Port Placement    Access:  Right IJ vein.  Findings: Catheter tip positioned at SVC/RA junction. Port is ready for immediate use.   Complications: None  EBL: < 10 mL  Recommendations:  - Ok to shower in 24 hours - Do not submerge for 7 days - Routine line care   Izea Livolsi T. Fronnie Urton, M.D Pager:  [phone]   

## 2020-05-07 NOTE — Discharge Instructions (Signed)
Take daily meds upon arrival to home,  needs benazepril.  Urgent needs - IR on call MD [phone]  Wound - May remove dressing and shower in 24 to 48 hours.  Keep site clean and dry.  Replace with bandaid. Do not submerge in tub or water until site healing well.  If ordered by your provider, may start Emla cream in 2 weeks or after incision is healed.  After completion of treatment, your provider should have you set up for monthly port flushes.    Implanted Port Insertion, Care After This sheet gives you information about how to care for yourself after your procedure. Your health care provider may also give you more specific instructions. If you have problems or questions, contact your health care provider. What can I expect after the procedure? After the procedure, it is common to have:  Discomfort at the port insertion site.  Bruising on the skin over the port. This should improve over 3-4 days. Follow these instructions at home: [facility] care  After your port is placed, you will get a manufacturer's information card. The card has information about your port. Keep this card with you at all times.  Take care of the port as told by your health care provider. Ask your health care provider if you or a family member can get training for taking care of the port at home. A home health care nurse may also take care of the port.  Make sure to remember what type of port you have. Incision care  Follow instructions from your health care provider about how to take care of your port insertion site. Make sure you: ? Wash your hands with soap and water before and after you change your bandage (dressing). If soap and water are not available, use hand sanitizer. ? Change your dressing as told by your health care provider. ? Leave stitches (sutures), skin glue, or adhesive strips in place. These skin closures may need to stay in place for 2 weeks or longer. If adhesive strip edges start to loosen and  curl up, you may trim the loose edges. Do not remove adhesive strips completely unless your health care provider tells you to do that.  Check your port insertion site every day for signs of infection. Check for: ? Redness, swelling, or pain. ? Fluid or blood. ? Warmth. ? Pus or a bad smell. Activity  Return to your normal activities as told by your health care provider. Ask your health care provider what activities are safe for you.  Do not lift anything that is heavier than 10 lb (4.5 kg), or the limit that you are told, until your health care provider says that it is safe. General instructions  Take over-the-counter and prescription medicines only as told by your health care provider.  Do not take baths, swim, or use a hot tub until your health care provider approves. Ask your health care provider if you may take showers. You may only be allowed to take sponge baths.  Do not drive for 24 hours if you were given a sedative during your procedure.  Wear a medical alert bracelet in case of an emergency. This will tell any health care providers that you have a port.  Keep all follow-up visits as told by your health care provider. This is important. Contact a health care provider if:  You cannot flush your port with saline as directed, or you cannot draw blood from the port.  You have a fever or  chills.  You have redness, swelling, or pain around your port insertion site.  You have fluid or blood coming from your port insertion site.  Your port insertion site feels warm to the touch.  You have pus or a bad smell coming from the port insertion site. Get help right away if:  You have chest pain or shortness of breath.  You have bleeding from your port that you cannot control. Summary  Take care of the port as told by your health care provider. Keep the manufacturer's information card with you at all times.  Change your dressing as told by your health care provider.  Contact a  health care provider if you have a fever or chills or if you have redness, swelling, or pain around your port insertion site.  Keep all follow-up visits as told by your health care provider. This information is not intended to replace advice given to you by your health care provider. Make sure you discuss any questions you have with your health care provider. Document Revised: 02/26/2018 Document Reviewed: 02/26/2018 Elsevier Patient Education  [employer].   Moderate Conscious Sedation, Adult, Care After These instructions provide you with information about caring for yourself after your procedure. Your health care provider may also give you more specific instructions. Your treatment has been planned according to current medical practices, but problems sometimes occur. Call your health care provider if you have any problems or questions after your procedure. What can I expect after the procedure? After your procedure, it is common:  To feel sleepy for several hours.  To feel clumsy and have poor balance for several hours.  To have poor judgment for several hours.  To vomit if you eat too soon. Follow these instructions at home: For at least 24 hours after the procedure:  Do not: ? Participate in activities where you could fall or become injured. ? Drive. ? Use heavy machinery. ? Drink alcohol. ? Take sleeping pills or medicines that cause drowsiness. ? Make important decisions or sign legal documents. ? Take care of children on your own.  Rest. Eating and drinking  Follow the diet recommended by your health care provider.  If you vomit: ? Drink water, juice, or soup when you can drink without vomiting. ? Make sure you have little or no nausea before eating solid foods. General instructions  Have a responsible adult stay with you until you are awake and alert.  Take over-the-counter and prescription medicines only as told by your health care provider.  If you  smoke, do not smoke without supervision.  Keep all follow-up visits as told by your health care provider. This is important. Contact a health care provider if:  You keep feeling nauseous or you keep vomiting.  You feel light-headed.  You develop a rash.  You have a fever. Get help right away if:  You have trouble breathing. This information is not intended to replace advice given to you by your health care provider. Make sure you discuss any questions you have with your health care provider. Document Revised: 07/13/2017 Document Reviewed: 11/20/2015 Elsevier Patient Education  2020 [employer].

## 2020-05-07 NOTE — Consult Note (Signed)
Chief Complaint: Patient was seen in consultation today for port a cath placement  Referring Physician(s): Ennever,Peter R  Supervising Physician: Aletta Edouard  Patient Status: Jerome Kelley  History of Present Illness: Jerome Kelley is a 79 y.o. male smoker with history of colon cancer in 2010, right lung adenocarcinoma 2011 and additional right lung cancer diagnosed in August of this year.  He presents today for Port-A-Cath placement for chemotherapy.  Additional history as below.  Past Medical History:  Diagnosis Date  . Colon cancer ([place]) 09/18/2011  . Goals of care, counseling/discussion 03/15/2020  . History of chemotherapy   . Hypertension   . Lung cancer, upper lobe ([place]) 09/18/2011  . Malignant neoplasm of upper lobe of right lung ([place]) 04/30/2020    Past Surgical History:  Procedure Laterality Date  . BRONCHIAL WASHINGS  03/19/2020   Procedure: BRONCHIAL WASHINGS;  Surgeon: Juanito Doom, MD;  Location: [facility];  Service: Cardiopulmonary;;  . COLON SURGERY  2010  . ENDOBRONCHIAL ULTRASOUND N/A 03/19/2020   Procedure: ENDOBRONCHIAL ULTRASOUND;  Surgeon: Juanito Doom, MD;  Location: [facility];  Service: Cardiopulmonary;  Laterality: N/A;  . FINE NEEDLE ASPIRATION  03/19/2020   Procedure: FINE NEEDLE ASPIRATION;  Surgeon: Juanito Doom, MD;  Location: [facility];  Service: Cardiopulmonary;;  . FINE NEEDLE ASPIRATION BIOPSY  04/02/2020   Procedure: FINE NEEDLE ASPIRATION BIOPSY;  Surgeon: Laurin Coder, MD;  Location: [facility];  Service: Pulmonary;;  . LUNG SURGERY     right side   . port a cath insertion    . port a cath removed    . TOTAL HIP ARTHROPLASTY Right 10/18/2018   Procedure: TOTAL HIP ARTHROPLASTY ANTERIOR APPROACH;  Surgeon: Mcarthur Rossetti, MD;  Location: [facility];  Service: Orthopedics;  Laterality: Right;  Marland Kitchen VIDEO BRONCHOSCOPY N/A 03/19/2020   Procedure: VIDEO BRONCHOSCOPY WITHOUT FLUORO;  Surgeon: Juanito Doom, MD;   Location: [facility];  Service: Cardiopulmonary;  Laterality: N/A;  . VIDEO BRONCHOSCOPY N/A 04/02/2020   Procedure: VIDEO BRONCHOSCOPY WITH EBUS;  Surgeon: Laurin Coder, MD;  Location: [facility];  Service: Pulmonary;  Laterality: N/A;    Allergies: Patient has no known allergies.  Medications: Prior to Admission medications   Medication Sig Start Date End Date Taking? Authorizing Provider  aspirin 81 MG chewable tablet Chew 1 tablet (81 mg total) by mouth 2 (two) times daily. 10/20/18  Yes Pete Pelt, PA-C  benazepril (LOTENSIN) 20 MG tablet Take 20 mg by mouth daily. 07/28/18   [provider]  dexamethasone (DECADRON) 4 MG tablet Take 1 tab two times a day the day before Alimta chemo, then take 2 tabs once a day for 3 days starting the day after cisplatin. 04/30/20   [name], MD  folic acid (FOLVITE) 1 MG tablet Take 1 tablet (1 mg total) by mouth daily. Start 5-7 days before Alimta chemotherapy. Continue until 21 days after Alimta completed. 04/30/20   [name], MD  lidocaine-prilocaine (EMLA) cream Apply to affected area once 04/30/20   [place], Holli Humbles, NP  ondansetron (ZOFRAN) 8 MG tablet Take 1 tablet (8 mg total) by mouth 2 (two) times daily as needed (Nausea or vomiting). Start if needed on the third day after cisplatin. 04/30/20   [name], MD  prochlorperazine (COMPAZINE) 10 MG tablet Take 1 tablet (10 mg total) by mouth every 6 (six) hours as needed (Nausea or vomiting). 04/30/20   [name], MD  vitamin  B-12 (CYANOCOBALAMIN) 1000 MCG tablet Take 1 tablet (1,000 mcg total) by mouth daily. 10/20/18   Patrecia Pour, MD     History reviewed. No pertinent family history.  Social History   Socioeconomic History  . Marital status: Married    Spouse name: Not on file  . Number of children: Not on file  . Years of education: Not on file  . Highest education level: Not on file  Occupational History  . Not on file  Tobacco Use  .  Smoking status: Current Every Day Smoker    Packs/day: 1.00    Years: 58.00    Pack years: 58.00    Types: Cigarettes    Start date: 09/03/1956  . Smokeless tobacco: Never Used  . Tobacco comment: does not want to quit  Vaping Use  . Vaping Use: Never used  Substance and Sexual Activity  . Alcohol use: Yes    Comment: occas beer  . Drug use: Never  . Sexual activity: Not on file  Other Topics Concern  . Not on file  Social History Narrative  . Not on file   Social Determinants of Health   Financial Resource Strain:   . Difficulty of Paying Living Expenses: Not on file  Food Insecurity:   . Worried About Charity fundraiser in the Last Year: Not on file  . Ran Out of Food in the Last Year: Not on file  Transportation Needs:   . Lack of Transportation (Medical): Not on file  . Lack of Transportation (Non-Medical): Not on file  Physical Activity:   . Days of Exercise per Week: Not on file  . Minutes of Exercise per Session: Not on file  Stress:   . Feeling of Stress : Not on file  Social Connections:   . Frequency of Communication with Friends and Family: Not on file  . Frequency of Social Gatherings with Friends and Family: Not on file  . Attends Religious Services: Not on file  . Active Member of Clubs or Organizations: Not on file  . Attends Archivist Meetings: Not on file  . Marital Status: Not on file      Review of Systems currently denies fever, headache, chest pain, worsening dyspnea, cough, abdominal/back pain, nausea, vomiting or bleeding  Vital Signs: BP (!) 211/84 Comment: RN notified  Pulse 60   Temp 97.7 F (36.5 C) (Oral)   Resp 18   SpO2 100%   Physical Exam awake, alert.  Chest with distant breath sounds bilaterally.  Heart with regular rate and rhythm.  Abdomen soft, positive bowel sounds, nontender.  No lower extremity edema.  Imaging: MR Brain W Wo Contrast  Result Date: 05/06/2020 CLINICAL DATA:  Malignant neoplasm of  unspecified part of unspecified bronchus or lung. Non-small cell lung cancer, staging; new diagnosis of stage IIIB NSCLC. EXAM: MRI HEAD WITHOUT AND WITH CONTRAST TECHNIQUE: Multiplanar, multiecho pulse sequences of the brain and surrounding structures were obtained without and with intravenous contrast. CONTRAST:  35mL GADAVIST GADOBUTROL 1 MMOL/ML IV SOLN COMPARISON:  No pertinent prior exams are available for comparison. FINDINGS: Brain: The axial diffusion-weighted sequence is moderately motion degraded. Mild generalized parenchymal atrophy. No abnormal enhancement is demonstrated to suggest intracranial metastatic disease. Mild multifocal T2/FLAIR hyperintensity within the cerebral white matter is nonspecific, but consistent with chronic small vessel ischemic disease. Prominent perivascular spaces within the basal ganglia bilaterally. There is no acute infarct. No evidence of intracranial mass. No chronic intracranial blood products. No extra-axial  fluid collection. No midline shift. Vascular: Expected proximal arterial flow voids. Incidentally noted right frontal lobe developmental venous anomaly, an anatomic variant (for instance as seen on series 20, images 89-90). Skull and upper cervical spine: No focal marrow lesion. Sinuses/Orbits: Bilateral lens replacements. Mild ethmoid sinus mucosal thickening. No significant mastoid effusion. Other: Small mucous retention cyst versus secretions within the posterior left pharynx. IMPRESSION: No evidence of intracranial metastatic disease. Mild generalized atrophy of the brain and chronic small vessel ischemic changes. Electronically Signed   By: Kellie Simmering DO   On: 05/06/2020 12:17    Labs:  CBC: Recent Labs    03/15/20 1451 03/26/20 0931 04/30/20 1153 05/07/20 1213  WBC 6.0 5.7 6.7 6.4  HGB 13.5 14.2 14.2 15.2  HCT 39.2 40.6 40.9 44.8  PLT 109* 113.0* 129* 116*    COAGS: Recent Labs    03/19/20 1100 03/26/20 0931  INR 1.0 1.1*  APTT 30 30.7     BMP: Recent Labs    03/15/20 1451 03/26/20 0931 04/30/20 1153  NA 142 140 142  K 3.7 4.0 3.7  CL 107 106 107  CO2 31 27 30   GLUCOSE 93 156* 100*  BUN 11 13 12   CALCIUM 8.9 8.6 9.2  CREATININE 0.76 0.84 0.80  GFRNONAA >60  --  >60  GFRAA >60  --  >60    LIVER FUNCTION TESTS: Recent Labs    03/15/20 1451 03/26/20 0931 04/30/20 1153  BILITOT 0.5 1.0 0.9  AST 9* 11 11*  ALT 7 7 7   ALKPHOS 95 98 110  PROT 6.1* 6.2 6.5  ALBUMIN 3.8 3.7 3.9    TUMOR MARKERS: No results for input(s): AFPTM, CEA, CA199, CHROMGRNA in the last 8760 hours.  Assessment and Plan:  79 y.o. male smoker with history of colon cancer in 2010, right lung adenocarcinoma 2011 and additional right lung cancer diagnosed in August of this year.  He presents today for Port-A-Cath placement for chemotherapy.Risks and benefits of image guided port-a-catheter placement was discussed with the patient including, but not limited to bleeding, infection, pneumothorax, or fibrin sheath development and need for additional procedures.  All of the patient's questions were answered, patient is agreeable to proceed. Consent signed and in chart.     Thank you for this interesting consult.  I greatly enjoyed meeting LONDEN BOK and look forward to participating in their care.  A copy of this report was sent to the requesting provider on this date.  Electronically Signed: D. Rowe Robert, PA-C 05/07/2020, 1:11 PM   I spent a total of 25 minutes    in face to face in clinical consultation, greater than 50% of which was counseling/coordinating care for Port-A-Cath placement

## 2020-05-10 ENCOUNTER — Inpatient Hospital Stay: Payer: Medicare Other

## 2020-05-10 ENCOUNTER — Encounter: Payer: Self-pay | Admitting: *Deleted

## 2020-05-10 ENCOUNTER — Other Ambulatory Visit: Payer: Self-pay

## 2020-05-10 ENCOUNTER — Other Ambulatory Visit: Payer: Self-pay | Admitting: *Deleted

## 2020-05-10 DIAGNOSIS — C3411 Malignant neoplasm of upper lobe, right bronchus or lung: Secondary | ICD-10-CM

## 2020-05-10 DIAGNOSIS — Z79899 Other long term (current) drug therapy: Secondary | ICD-10-CM | POA: Diagnosis not present

## 2020-05-10 DIAGNOSIS — C342 Malignant neoplasm of middle lobe, bronchus or lung: Secondary | ICD-10-CM | POA: Diagnosis not present

## 2020-05-10 DIAGNOSIS — Z5111 Encounter for antineoplastic chemotherapy: Secondary | ICD-10-CM | POA: Diagnosis not present

## 2020-05-10 DIAGNOSIS — F17290 Nicotine dependence, other tobacco product, uncomplicated: Secondary | ICD-10-CM | POA: Diagnosis not present

## 2020-05-10 DIAGNOSIS — Z85118 Personal history of other malignant neoplasm of bronchus and lung: Secondary | ICD-10-CM | POA: Diagnosis not present

## 2020-05-10 LAB — CBC WITH DIFFERENTIAL (CANCER CENTER ONLY)
Abs Immature Granulocytes: 0.09 10*3/uL — ABNORMAL HIGH (ref 0.00–0.07)
Basophils Absolute: 0 10*3/uL (ref 0.0–0.1)
Basophils Relative: 0 %
Eosinophils Absolute: 0 10*3/uL (ref 0.0–0.5)
Eosinophils Relative: 0 %
HCT: 38.6 % — ABNORMAL LOW (ref 39.0–52.0)
Hemoglobin: 13.6 g/dL (ref 13.0–17.0)
Immature Granulocytes: 1 %
Lymphocytes Relative: 14 %
Lymphs Abs: 1.4 10*3/uL (ref 0.7–4.0)
MCH: 33.9 pg (ref 26.0–34.0)
MCHC: 35.2 g/dL (ref 30.0–36.0)
MCV: 96.3 fL (ref 80.0–100.0)
Monocytes Absolute: 1 10*3/uL (ref 0.1–1.0)
Monocytes Relative: 10 %
Neutro Abs: 7.4 10*3/uL (ref 1.7–7.7)
Neutrophils Relative %: 75 %
Platelet Count: 121 10*3/uL — ABNORMAL LOW (ref 150–400)
RBC: 4.01 MIL/uL — ABNORMAL LOW (ref 4.22–5.81)
RDW: 13.4 % (ref 11.5–15.5)
WBC Count: 9.9 10*3/uL (ref 4.0–10.5)
nRBC: 0 % (ref 0.0–0.2)

## 2020-05-10 LAB — CMP (CANCER CENTER ONLY)
ALT: 6 U/L (ref 0–44)
AST: 9 U/L — ABNORMAL LOW (ref 15–41)
Albumin: 3.7 g/dL (ref 3.5–5.0)
Alkaline Phosphatase: 87 U/L (ref 38–126)
Anion gap: 5 (ref 5–15)
BUN: 13 mg/dL (ref 8–23)
CO2: 29 mmol/L (ref 22–32)
Calcium: 9.3 mg/dL (ref 8.9–10.3)
Chloride: 108 mmol/L (ref 98–111)
Creatinine: 0.72 mg/dL (ref 0.61–1.24)
GFR, Est AFR Am: >60 mL/min (ref >60)
GFR, Estimated: >60 mL/min (ref >60)
Glucose, Bld: 101 mg/dL — ABNORMAL HIGH (ref 70–99)
Potassium: 3.5 mmol/L (ref 3.5–5.1)
Sodium: 142 mmol/L (ref 135–145)
Total Bilirubin: 0.6 mg/dL (ref 0.3–1.2)
Total Protein: 6.3 g/dL — ABNORMAL LOW (ref 6.5–8.1)

## 2020-05-10 LAB — TSH: TSH: 0.881 u[IU]/mL (ref 0.320–4.118)

## 2020-05-10 MED ORDER — SODIUM CHLORIDE 0.9 % IV SOLN
400.0000 mg/m2 | Freq: Once | INTRAVENOUS | Status: AC
  Administered 2020-05-10: 700 mg via INTRAVENOUS
  Filled 2020-05-10: qty 20

## 2020-05-10 MED ORDER — PALONOSETRON HCL INJECTION 0.25 MG/5ML
INTRAVENOUS | Status: AC
  Filled 2020-05-10: qty 5

## 2020-05-10 MED ORDER — SODIUM CHLORIDE 0.9 % IV SOLN
10.0000 mg | Freq: Once | INTRAVENOUS | Status: AC
  Administered 2020-05-10: 10 mg via INTRAVENOUS
  Filled 2020-05-10: qty 10

## 2020-05-10 MED ORDER — SODIUM CHLORIDE 0.9 % IV SOLN
150.0000 mg | Freq: Once | INTRAVENOUS | Status: AC
  Administered 2020-05-10: 150 mg via INTRAVENOUS
  Filled 2020-05-10: qty 150

## 2020-05-10 MED ORDER — PALONOSETRON HCL INJECTION 0.25 MG/5ML
0.2500 mg | Freq: Once | INTRAVENOUS | Status: AC
  Administered 2020-05-10: 0.25 mg via INTRAVENOUS

## 2020-05-10 MED ORDER — HEPARIN SOD (PORK) LOCK FLUSH 100 UNIT/ML IV SOLN
500.0000 [IU] | Freq: Once | INTRAVENOUS | Status: AC | PRN
  Administered 2020-05-10: 500 [IU]
  Filled 2020-05-10: qty 5

## 2020-05-10 MED ORDER — SODIUM CHLORIDE 0.9 % IV SOLN
Freq: Once | INTRAVENOUS | Status: AC
  Filled 2020-05-10: qty 250

## 2020-05-10 MED ORDER — LIDOCAINE-PRILOCAINE 2.5-2.5 % EX CREA: TOPICAL_CREAM | CUTANEOUS | 3 refills | Status: DC

## 2020-05-10 MED ORDER — SODIUM CHLORIDE 0.9 % IV SOLN
200.0000 mg | Freq: Once | INTRAVENOUS | Status: AC
  Administered 2020-05-10: 200 mg via INTRAVENOUS
  Filled 2020-05-10: qty 8

## 2020-05-10 MED ORDER — SODIUM CHLORIDE 0.9% FLUSH
10.0000 mL | INTRAVENOUS | Status: DC | PRN
  Administered 2020-05-10: 10 mL
  Filled 2020-05-10: qty 10

## 2020-05-10 MED ORDER — SODIUM CHLORIDE 0.9 % IV SOLN
417.5000 mg | Freq: Once | INTRAVENOUS | Status: AC
  Administered 2020-05-10: 420 mg via INTRAVENOUS
  Filled 2020-05-10: qty 42

## 2020-05-10 NOTE — Patient Instructions (Signed)

## 2020-05-10 NOTE — Patient Instructions (Signed)
[facility] Discharge Instructions for Patients Receiving Chemotherapy  Today you received the following chemotherapy agents Alimta, Keytruda and Carboplatin  To help prevent nausea and vomiting after your treatment, we encourage you to take your nausea medication as prescribed by MD.   If you develop nausea and vomiting that is not controlled by your nausea medication, call the clinic.   BELOW ARE SYMPTOMS THAT SHOULD BE REPORTED IMMEDIATELY:  *FEVER GREATER THAN 100.5 F  *CHILLS WITH OR WITHOUT FEVER  NAUSEA AND VOMITING THAT IS NOT CONTROLLED WITH YOUR NAUSEA MEDICATION  *UNUSUAL SHORTNESS OF BREATH  *UNUSUAL BRUISING OR BLEEDING  TENDERNESS IN MOUTH AND THROAT WITH OR WITHOUT PRESENCE OF ULCERS  *URINARY PROBLEMS  *BOWEL PROBLEMS  UNUSUAL RASH Items with * indicate a potential emergency and should be followed up as soon as possible.  Feel free to call the clinic should you have any questions or concerns. The clinic phone number is [phone].  Please show the [facility] at check-in to the Emergency Department and triage nurse.

## 2020-05-10 NOTE — Progress Notes (Signed)
Met with patient in treatment. He is starting his first cycle. He's had chemo in the past and feels like he knowledgeable and ready to start today. He has no questions or concerns.   Reviewed with him the usual referral to nutrition with the initiation of treatment. At this time he would like to hold on a referral. He states he is eating and drinking normally. Instructed patient to reach out if he started to have problems with nutrition, or started to experience any weight loss. He agreed.  Patient requested a prescription for EMLA be sent to his pharmacy. Prescription sent. Gave patient my card and encouraged him to reach out with any questions or concerns.   Oncology Nurse Navigator Documentation  Oncology Nurse Navigator Flowsheets 05/10/2020  Abnormal Finding Date -  Confirmed Diagnosis Date -  Diagnosis Status -  Planned Course of Treatment Chemotherapy  Phase of Treatment Chemo  Chemotherapy Actual Start Date: 05/10/2020  Navigator Follow Up Date: 05/31/2020  Navigator Follow Up Reason: Follow-up Appointment;Chemotherapy  Navigator Location [facility]  Referral Date to RadOnc/MedOnc -  Navigator Encounter Type Treatment  Treatment Initiated Date 05/10/2020  Patient Visit Type MedOnc  Treatment Phase First Chemo Tx  Barriers/Navigation Needs Coordination of Care;Education  Education Other  Interventions Coordination of Care;Psycho-Social Support;Education  Acuity Level 2-Minimal Needs (1-2 Barriers Identified)  Referrals Nutrition/dietician  Coordination of Care -  Education Method Verbal  Support Groups/Services Friends and Family  Time Spent with Patient 30

## 2020-05-11 LAB — T4: T4, Total: 6.3 ug/dL (ref 4.5–12.0)

## 2020-05-14 ENCOUNTER — Other Ambulatory Visit: Payer: Self-pay | Admitting: Hematology & Oncology

## 2020-05-31 ENCOUNTER — Inpatient Hospital Stay (HOSPITAL_BASED_OUTPATIENT_CLINIC_OR_DEPARTMENT_OTHER): Payer: Medicare Other | Admitting: Family

## 2020-05-31 ENCOUNTER — Inpatient Hospital Stay: Payer: Medicare Other

## 2020-05-31 ENCOUNTER — Encounter: Payer: Self-pay | Admitting: Family

## 2020-05-31 ENCOUNTER — Other Ambulatory Visit: Payer: Self-pay

## 2020-05-31 ENCOUNTER — Inpatient Hospital Stay: Payer: Medicare Other | Attending: Hematology & Oncology

## 2020-05-31 VITALS — BP 162/59 | HR 61 | Temp 97.7°F | Resp 18 | Ht 68.0 in | Wt 154.0 lb

## 2020-05-31 DIAGNOSIS — C3411 Malignant neoplasm of upper lobe, right bronchus or lung: Secondary | ICD-10-CM

## 2020-05-31 DIAGNOSIS — D508 Other iron deficiency anemias: Secondary | ICD-10-CM | POA: Diagnosis not present

## 2020-05-31 DIAGNOSIS — Z5112 Encounter for antineoplastic immunotherapy: Secondary | ICD-10-CM | POA: Diagnosis not present

## 2020-05-31 DIAGNOSIS — C342 Malignant neoplasm of middle lobe, bronchus or lung: Secondary | ICD-10-CM | POA: Diagnosis not present

## 2020-05-31 DIAGNOSIS — E032 Hypothyroidism due to medicaments and other exogenous substances: Secondary | ICD-10-CM | POA: Diagnosis not present

## 2020-05-31 DIAGNOSIS — Z79899 Other long term (current) drug therapy: Secondary | ICD-10-CM | POA: Insufficient documentation

## 2020-05-31 DIAGNOSIS — Z5111 Encounter for antineoplastic chemotherapy: Secondary | ICD-10-CM | POA: Diagnosis not present

## 2020-05-31 LAB — CMP (CANCER CENTER ONLY)
ALT: 7 U/L (ref 0–44)
AST: 9 U/L — ABNORMAL LOW (ref 15–41)
Albumin: 3.5 g/dL (ref 3.5–5.0)
Alkaline Phosphatase: 99 U/L (ref 38–126)
Anion gap: 6 (ref 5–15)
BUN: 11 mg/dL (ref 8–23)
CO2: 28 mmol/L (ref 22–32)
Calcium: 9.8 mg/dL (ref 8.9–10.3)
Chloride: 108 mmol/L (ref 98–111)
Creatinine: 0.73 mg/dL (ref 0.61–1.24)
GFR, Estimated: >60 mL/min (ref >60)
Glucose, Bld: 104 mg/dL — ABNORMAL HIGH (ref 70–99)
Potassium: 3.6 mmol/L (ref 3.5–5.1)
Sodium: 142 mmol/L (ref 135–145)
Total Bilirubin: 0.4 mg/dL (ref 0.3–1.2)
Total Protein: 6.1 g/dL — ABNORMAL LOW (ref 6.5–8.1)

## 2020-05-31 LAB — CBC WITH DIFFERENTIAL (CANCER CENTER ONLY)
Abs Immature Granulocytes: 0.1 10*3/uL — ABNORMAL HIGH (ref 0.00–0.07)
Basophils Absolute: 0 10*3/uL (ref 0.0–0.1)
Basophils Relative: 0 %
Eosinophils Absolute: 0 10*3/uL (ref 0.0–0.5)
Eosinophils Relative: 1 %
HCT: 33.1 % — ABNORMAL LOW (ref 39.0–52.0)
Hemoglobin: 11.3 g/dL — ABNORMAL LOW (ref 13.0–17.0)
Immature Granulocytes: 2 %
Lymphocytes Relative: 23 %
Lymphs Abs: 1.3 10*3/uL (ref 0.7–4.0)
MCH: 32.8 pg (ref 26.0–34.0)
MCHC: 34.1 g/dL (ref 30.0–36.0)
MCV: 95.9 fL (ref 80.0–100.0)
Monocytes Absolute: 1 10*3/uL (ref 0.1–1.0)
Monocytes Relative: 17 %
Neutro Abs: 3.4 10*3/uL (ref 1.7–7.7)
Neutrophils Relative %: 57 %
Platelet Count: 238 10*3/uL (ref 150–400)
RBC: 3.45 MIL/uL — ABNORMAL LOW (ref 4.22–5.81)
RDW: 14.8 % (ref 11.5–15.5)
WBC Count: 5.8 10*3/uL (ref 4.0–10.5)
nRBC: 0 % (ref 0.0–0.2)

## 2020-05-31 LAB — TSH: TSH: 0.746 u[IU]/mL (ref 0.320–4.118)

## 2020-05-31 MED ORDER — SODIUM CHLORIDE 0.9 % IV SOLN
Freq: Once | INTRAVENOUS | Status: AC
  Filled 2020-05-31: qty 250

## 2020-05-31 MED ORDER — SODIUM CHLORIDE 0.9 % IV SOLN
420.0000 mg | Freq: Once | INTRAVENOUS | Status: AC
  Administered 2020-05-31: 420 mg via INTRAVENOUS
  Filled 2020-05-31: qty 42

## 2020-05-31 MED ORDER — SODIUM CHLORIDE 0.9 % IV SOLN
150.0000 mg | Freq: Once | INTRAVENOUS | Status: AC
  Administered 2020-05-31: 150 mg via INTRAVENOUS
  Filled 2020-05-31: qty 150

## 2020-05-31 MED ORDER — SODIUM CHLORIDE 0.9 % IV SOLN
10.0000 mg | Freq: Once | INTRAVENOUS | Status: AC
  Administered 2020-05-31: 10 mg via INTRAVENOUS
  Filled 2020-05-31: qty 10

## 2020-05-31 MED ORDER — SODIUM CHLORIDE 0.9% FLUSH
10.0000 mL | INTRAVENOUS | Status: DC | PRN
  Administered 2020-05-31: 10 mL
  Filled 2020-05-31: qty 10

## 2020-05-31 MED ORDER — SODIUM CHLORIDE 0.9 % IV SOLN
400.0000 mg/m2 | Freq: Once | INTRAVENOUS | Status: AC
  Administered 2020-05-31: 700 mg via INTRAVENOUS
  Filled 2020-05-31: qty 20

## 2020-05-31 MED ORDER — CYANOCOBALAMIN 1000 MCG/ML IJ SOLN: 1000.0000 ug | Freq: Once | INTRAMUSCULAR | Status: DC

## 2020-05-31 MED ORDER — HEPARIN SOD (PORK) LOCK FLUSH 100 UNIT/ML IV SOLN
500.0000 [IU] | Freq: Once | INTRAVENOUS | Status: AC | PRN
  Administered 2020-05-31: 500 [IU]
  Filled 2020-05-31: qty 5

## 2020-05-31 MED ORDER — CYANOCOBALAMIN 1000 MCG/ML IJ SOLN
INTRAMUSCULAR | Status: AC
  Filled 2020-05-31: qty 1

## 2020-05-31 MED ORDER — PALONOSETRON HCL INJECTION 0.25 MG/5ML
0.2500 mg | Freq: Once | INTRAVENOUS | Status: AC
  Administered 2020-05-31: 0.25 mg via INTRAVENOUS

## 2020-05-31 MED ORDER — PALONOSETRON HCL INJECTION 0.25 MG/5ML
INTRAVENOUS | Status: AC
  Filled 2020-05-31: qty 5

## 2020-05-31 MED ORDER — SODIUM CHLORIDE 0.9 % IV SOLN
200.0000 mg | Freq: Once | INTRAVENOUS | Status: AC
  Administered 2020-05-31: 200 mg via INTRAVENOUS
  Filled 2020-05-31: qty 8

## 2020-05-31 NOTE — Progress Notes (Signed)
Hematology and Oncology Follow Up Visit  Jerome Kelley [id number] 08-Dec-1940 79 y.o. 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 He did well with cycle one and has no complaints.  He has some fatigue at times and will take breaks to rest when needed.  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 He really enjoys having something to do.  He is still smoking 1 ppd and has about 6 beers a week.  He has had some sinus congestion and drainage with the fall weather change. He states that this is normal for him at this time of year.  He has lightheadedness if he sits up in bed or stands to quickly so he is careful to take his time.  No fever, chills, n/v, cough, rash, dizziness, SOB, chest pain, palpitations, abdominal pain or changes in bowel or bladder habits.  He takes a stool softener as needed for occasional constipation.  No episodes of bleeding. No bruising or petechiae.  No swelling, tenderness, numbness or tingling in his extremities.  No falls or syncope.  He has maintained a good appetite and is staying well hydrated. His weight is stable.   ECOG Performance Status: 1 - Symptomatic but completely ambulatory  Medications:  Allergies as of 05/31/2020   No Known Allergies     Medication List       Accurate as of May 31, 2020  9:35 AM. If you have any questions, ask your nurse or doctor.        aspirin 81 MG chewable tablet Chew 1 tablet (81 mg total) by mouth 2 (two) times daily.   benazepril 20 MG tablet Commonly known as: LOTENSIN Take 20 mg by mouth  daily.   dexamethasone 4 MG tablet Commonly known as: DECADRON Take 1 tab two times a day the day before Alimta chemo, then take 2 tabs once a day for 3 days starting the day after cisplatin.   folic acid 1 MG tablet Commonly known as: FOLVITE Take 1 tablet (1 mg total) by mouth daily. Start 5-7 days before Alimta chemotherapy. Continue until 21 days after Alimta completed.   lidocaine-prilocaine cream Commonly known as: EMLA Apply to affected area once   ondansetron 8 MG tablet Commonly known as: Zofran Take 1 tablet (8 mg total) by mouth 2 (two) times daily as needed (Nausea or vomiting). Start if needed on the third day after cisplatin.   prochlorperazine 10 MG tablet Commonly known as: COMPAZINE Take 1 tablet (10 mg total) by mouth every 6 (six) hours as needed (Nausea or vomiting).   vitamin B-12 1000 MCG tablet Commonly known as: CYANOCOBALAMIN Take 1 tablet (1,000 mcg total) by mouth daily.       Allergies: No Known Allergies  Past Medical History, Surgical history, Social history, and Family History were reviewed and updated.  Review of Systems: All other 10 point review of systems is negative.   Physical Exam:  vitals were not taken for this visit.   Wt Readings from Last 3 Encounters:  04/30/20 152 lb 6.4 oz (69.1 kg)  04/02/20  170 lb (77.1 kg)  03/19/20 155 lb (70.3 kg)    Ocular: Sclerae unicteric, pupils equal, round and reactive to light Ear-nose-throat: Oropharynx clear, dentition fair Lymphatic: No cervical, supraclavicular or axillary adenopathy Lungs no rales or rhonchi, good excursion bilaterally Heart regular rate and rhythm, no murmur appreciated Abd soft, nontender, positive bowel sounds, no liver or spleen tip palpated on exam, no fluid wave  MSK no focal spinal tenderness, no joint edema Neuro: non-focal, well-oriented, appropriate affect Breasts: Deferred   Lab Results  Component Value Date   WBC 5.8 05/31/2020   HGB 11.3 (L)  05/31/2020   HCT 33.1 (L) 05/31/2020   MCV 95.9 05/31/2020   PLT 238 05/31/2020   Lab Results  Component Value Date   FERRITIN 337 (H) 10/20/2018   IRON 34 (L) 10/20/2018   TIBC 241 (L) 10/20/2018   UIBC 207 10/20/2018   IRONPCTSAT 14 (L) 10/20/2018   Lab Results  Component Value Date   RBC 3.45 (L) 05/31/2020   No results found for: KPAFRELGTCHN, LAMBDASER, KAPLAMBRATIO No results found for: IGGSERUM, IGA, IGMSERUM No results found for: Odetta Pink, SPEI   Chemistry      Component Value Date/Time   NA 142 05/10/2020 0945   NA 140 06/28/2011 0950   K 3.5 05/10/2020 0945   K 4.7 06/28/2011 0950   CL 108 05/10/2020 0945   CL 100 06/28/2011 0950   CO2 29 05/10/2020 0945   CO2 29 06/28/2011 0950   BUN 13 05/10/2020 0945   BUN 10 06/28/2011 0950   CREATININE 0.72 05/10/2020 0945   CREATININE 0.7 06/28/2011 0950      Component Value Date/Time   CALCIUM 9.3 05/10/2020 0945   CALCIUM 9.0 06/28/2011 0950   ALKPHOS 87 05/10/2020 0945   ALKPHOS 110 (H) 12/22/2010 0850   AST 9 (L) 05/10/2020 0945   ALT 6 05/10/2020 0945   ALT 18 12/22/2010 0850   BILITOT 0.6 05/10/2020 0945       Impression and Plan: Jerome Kelley is a very pleasant 79 yo caucasian gentleman with previous history of right lung cancer as well as colon cancer 10 years ago.  He now has stage IIIb adenocarcinoma of the lung, right middle lobe.  He tolerated his fist cycle of Carboplatinum/Alimta/Pembrolizumab nicely and we will proceed with cycle 2 today as planned.  After cycle 3 we can repeat a PET scan to assess his response.  The goal is to complete a total of 6 cycles and then reassess his need for radiation therapy.  All this was discussed with the patient and he verbalized understanding and agreement with the pla.  Follow-up in 3 weeks.  He was encouraged to contact our office with any questions or concerns.   Laverna Peace, NP 10/18/20219:35  AM

## 2020-05-31 NOTE — Progress Notes (Signed)
Pt discharged in no apparent distress. Pt left ambulatory without assistance. Pt aware of discharge instructions and verbalized understanding and had no further questions.  

## 2020-05-31 NOTE — Patient Instructions (Signed)
[facility] Discharge Instructions for Patients Receiving Chemotherapy  Today you received the following chemotherapy agents Alimta, Keytruda and Carboplatin  To help prevent nausea and vomiting after your treatment, we encourage you to take your nausea medication as prescribed by MD.   If you develop nausea and vomiting that is not controlled by your nausea medication, call the clinic.   BELOW ARE SYMPTOMS THAT SHOULD BE REPORTED IMMEDIATELY:  *FEVER GREATER THAN 100.5 F  *CHILLS WITH OR WITHOUT FEVER  NAUSEA AND VOMITING THAT IS NOT CONTROLLED WITH YOUR NAUSEA MEDICATION  *UNUSUAL SHORTNESS OF BREATH  *UNUSUAL BRUISING OR BLEEDING  TENDERNESS IN MOUTH AND THROAT WITH OR WITHOUT PRESENCE OF ULCERS  *URINARY PROBLEMS  *BOWEL PROBLEMS  UNUSUAL RASH Items with * indicate a potential emergency and should be followed up as soon as possible.  Feel free to call the clinic should you have any questions or concerns. The clinic phone number is [phone].  Please show the [facility] at check-in to the Emergency Department and triage nurse.

## 2020-05-31 NOTE — Patient Instructions (Signed)
Implanted Port Insertion, Care After °This sheet gives you information about how to care for yourself after your procedure. Your health care provider may also give you more specific instructions. If you have problems or questions, contact your health care provider. °What can I expect after the procedure? °After the procedure, it is common to have: °· Discomfort at the port insertion site. °· Bruising on the skin over the port. This should improve over 3-4 days. °Follow these instructions at home: °Port care °· After your port is placed, you will get a manufacturer's information card. The card has information about your port. Keep this card with you at all times. °· Take care of the port as told by your health care provider. Ask your health care provider if you or a family member can get training for taking care of the port at home. A home health care nurse may also take care of the port. °· Make sure to remember what type of port you have. °Incision care ° °  ° °· Follow instructions from your health care provider about how to take care of your port insertion site. Make sure you: °? Wash your hands with soap and water before and after you change your bandage (dressing). If soap and water are not available, use hand sanitizer. °? Change your dressing as told by your health care provider. °? Leave stitches (sutures), skin glue, or adhesive strips in place. These skin closures may need to stay in place for 2 weeks or longer. If adhesive strip edges start to loosen and curl up, you may trim the loose edges. Do not remove adhesive strips completely unless your health care provider tells you to do that. °· Check your port insertion site every day for signs of infection. Check for: °? Redness, swelling, or pain. °? Fluid or blood. °? Warmth. °? Pus or a bad smell. °Activity °· Return to your normal activities as told by your health care provider. Ask your health care provider what activities are safe for you. °· Do not  lift anything that is heavier than 10 lb (4.5 kg), or the limit that you are told, until your health care provider says that it is safe. °General instructions °· Take over-the-counter and prescription medicines only as told by your health care provider. °· Do not take baths, swim, or use a hot tub until your health care provider approves. Ask your health care provider if you may take showers. You may only be allowed to take sponge baths. °· Do not drive for 24 hours if you were given a sedative during your procedure. °· Wear a medical alert bracelet in case of an emergency. This will tell any health care providers that you have a port. °· Keep all follow-up visits as told by your health care provider. This is important. °Contact a health care provider if: °· You cannot flush your port with saline as directed, or you cannot draw blood from the port. °· You have a fever or chills. °· You have redness, swelling, or pain around your port insertion site. °· You have fluid or blood coming from your port insertion site. °· Your port insertion site feels warm to the touch. °· You have pus or a bad smell coming from the port insertion site. °Get help right away if: °· You have chest pain or shortness of breath. °· You have bleeding from your port that you cannot control. °Summary °· Take care of the port as told by your health   care provider. Keep the manufacturer's information card with you at all times. °· Change your dressing as told by your health care provider. °· Contact a health care provider if you have a fever or chills or if you have redness, swelling, or pain around your port insertion site. °· Keep all follow-up visits as told by your health care provider. °This information is not intended to replace advice given to you by your health care provider. Make sure you discuss any questions you have with your health care provider. °Document Revised: 02/26/2018 Document Reviewed: 02/26/2018 °Elsevier Patient Education ©  2020 [employer] ° °

## 2020-06-01 ENCOUNTER — Encounter: Payer: Self-pay | Admitting: *Deleted

## 2020-06-01 LAB — T4: T4, Total: 6 ug/dL (ref 4.5–12.0)

## 2020-06-01 NOTE — Progress Notes (Signed)
Oncology Nurse Navigator Documentation  Oncology Nurse Navigator Flowsheets 06/01/2020  Abnormal Finding Date -  Confirmed Diagnosis Date -  Diagnosis Status -  Planned Course of Treatment -  Phase of Treatment -  Chemotherapy Actual Start Date: -  Navigator Follow Up Date: 06/21/2020  Navigator Follow Up Reason: Follow-up Appointment;Chemotherapy  Navigator Location [facility]  Referral Date to RadOnc/MedOnc -  Navigator Encounter Type Appt/Treatment Plan Review  Treatment Initiated Date -  Patient Visit Type MedOnc  Treatment Phase Active Tx  Barriers/Navigation Needs No Barriers At This Time  Education -  Interventions None Required  Acuity Level 1-No Barriers  Referrals -  Coordination of Care -  Education Method -  Support Groups/Services Friends and Family  Time Spent with Patient 15

## 2020-06-21 ENCOUNTER — Inpatient Hospital Stay (HOSPITAL_BASED_OUTPATIENT_CLINIC_OR_DEPARTMENT_OTHER): Payer: Medicare Other | Admitting: Family

## 2020-06-21 ENCOUNTER — Encounter: Payer: Self-pay | Admitting: Family

## 2020-06-21 ENCOUNTER — Inpatient Hospital Stay: Payer: Medicare Other

## 2020-06-21 ENCOUNTER — Other Ambulatory Visit: Payer: Self-pay

## 2020-06-21 ENCOUNTER — Inpatient Hospital Stay: Payer: Medicare Other | Attending: Hematology & Oncology

## 2020-06-21 VITALS — BP 170/68 | HR 63 | Temp 98.2°F | Resp 18 | Ht 68.0 in | Wt 155.4 lb

## 2020-06-21 DIAGNOSIS — E032 Hypothyroidism due to medicaments and other exogenous substances: Secondary | ICD-10-CM

## 2020-06-21 DIAGNOSIS — Z79899 Other long term (current) drug therapy: Secondary | ICD-10-CM | POA: Insufficient documentation

## 2020-06-21 DIAGNOSIS — C342 Malignant neoplasm of middle lobe, bronchus or lung: Secondary | ICD-10-CM | POA: Insufficient documentation

## 2020-06-21 DIAGNOSIS — C3411 Malignant neoplasm of upper lobe, right bronchus or lung: Secondary | ICD-10-CM

## 2020-06-21 DIAGNOSIS — Z5112 Encounter for antineoplastic immunotherapy: Secondary | ICD-10-CM | POA: Insufficient documentation

## 2020-06-21 DIAGNOSIS — D508 Other iron deficiency anemias: Secondary | ICD-10-CM

## 2020-06-21 DIAGNOSIS — Z5111 Encounter for antineoplastic chemotherapy: Secondary | ICD-10-CM | POA: Insufficient documentation

## 2020-06-21 LAB — CMP (CANCER CENTER ONLY)
ALT: 11 U/L (ref 0–44)
AST: 12 U/L — ABNORMAL LOW (ref 15–41)
Albumin: 3.4 g/dL — ABNORMAL LOW (ref 3.5–5.0)
Alkaline Phosphatase: 93 U/L (ref 38–126)
Anion gap: 4 — ABNORMAL LOW (ref 5–15)
BUN: 10 mg/dL (ref 8–23)
CO2: 28 mmol/L (ref 22–32)
Calcium: 9 mg/dL (ref 8.9–10.3)
Chloride: 108 mmol/L (ref 98–111)
Creatinine: 0.68 mg/dL (ref 0.61–1.24)
GFR, Estimated: >60 mL/min (ref >60)
Glucose, Bld: 95 mg/dL (ref 70–99)
Potassium: 3.6 mmol/L (ref 3.5–5.1)
Sodium: 140 mmol/L (ref 135–145)
Total Bilirubin: 0.6 mg/dL (ref 0.3–1.2)
Total Protein: 5.5 g/dL — ABNORMAL LOW (ref 6.5–8.1)

## 2020-06-21 LAB — IRON AND TIBC
Iron: 88 ug/dL (ref 42–163)
Saturation Ratios: 37 % (ref 20–55)
TIBC: 236 ug/dL (ref 202–409)
UIBC: 148 ug/dL (ref 117–376)

## 2020-06-21 LAB — CBC WITH DIFFERENTIAL (CANCER CENTER ONLY)
Abs Immature Granulocytes: 0.05 10*3/uL (ref 0.00–0.07)
Basophils Absolute: 0 10*3/uL (ref 0.0–0.1)
Basophils Relative: 0 %
Eosinophils Absolute: 0 10*3/uL (ref 0.0–0.5)
Eosinophils Relative: 1 %
HCT: 28.9 % — ABNORMAL LOW (ref 39.0–52.0)
Hemoglobin: 9.9 g/dL — ABNORMAL LOW (ref 13.0–17.0)
Immature Granulocytes: 1 %
Lymphocytes Relative: 29 %
Lymphs Abs: 1.2 10*3/uL (ref 0.7–4.0)
MCH: 34.1 pg — ABNORMAL HIGH (ref 26.0–34.0)
MCHC: 34.3 g/dL (ref 30.0–36.0)
MCV: 99.7 fL (ref 80.0–100.0)
Monocytes Absolute: 0.9 10*3/uL (ref 0.1–1.0)
Monocytes Relative: 21 %
Neutro Abs: 1.9 10*3/uL (ref 1.7–7.7)
Neutrophils Relative %: 48 %
Platelet Count: 137 10*3/uL — ABNORMAL LOW (ref 150–400)
RBC: 2.9 MIL/uL — ABNORMAL LOW (ref 4.22–5.81)
RDW: 18.4 % — ABNORMAL HIGH (ref 11.5–15.5)
WBC Count: 4 10*3/uL (ref 4.0–10.5)
nRBC: 0 % (ref 0.0–0.2)

## 2020-06-21 LAB — FERRITIN: Ferritin: 363 ng/mL — ABNORMAL HIGH (ref 24–336)

## 2020-06-21 LAB — TSH: TSH: 1.034 u[IU]/mL (ref 0.320–4.118)

## 2020-06-21 MED ORDER — PALONOSETRON HCL INJECTION 0.25 MG/5ML
0.2500 mg | Freq: Once | INTRAVENOUS | Status: AC
  Administered 2020-06-21: 0.25 mg via INTRAVENOUS

## 2020-06-21 MED ORDER — SODIUM CHLORIDE 0.9 % IV SOLN
400.0000 mg/m2 | Freq: Once | INTRAVENOUS | Status: AC
  Administered 2020-06-21: 700 mg via INTRAVENOUS
  Filled 2020-06-21: qty 8

## 2020-06-21 MED ORDER — SODIUM CHLORIDE 0.9 % IV SOLN
10.0000 mg | Freq: Once | INTRAVENOUS | Status: AC
  Administered 2020-06-21: 10 mg via INTRAVENOUS
  Filled 2020-06-21: qty 10

## 2020-06-21 MED ORDER — PALONOSETRON HCL INJECTION 0.25 MG/5ML
INTRAVENOUS | Status: AC
  Filled 2020-06-21: qty 5

## 2020-06-21 MED ORDER — SODIUM CHLORIDE 0.9 % IV SOLN
150.0000 mg | Freq: Once | INTRAVENOUS | Status: AC
  Administered 2020-06-21: 150 mg via INTRAVENOUS
  Filled 2020-06-21: qty 150

## 2020-06-21 MED ORDER — SODIUM CHLORIDE 0.9 % IV SOLN
420.0000 mg | Freq: Once | INTRAVENOUS | Status: AC
  Administered 2020-06-21: 420 mg via INTRAVENOUS
  Filled 2020-06-21: qty 42

## 2020-06-21 MED ORDER — SODIUM CHLORIDE 0.9 % IV SOLN
Freq: Once | INTRAVENOUS | Status: AC
  Filled 2020-06-21: qty 250

## 2020-06-21 MED ORDER — SODIUM CHLORIDE 0.9% FLUSH
10.0000 mL | INTRAVENOUS | Status: DC | PRN
  Administered 2020-06-21: 10 mL
  Filled 2020-06-21: qty 10

## 2020-06-21 MED ORDER — SODIUM CHLORIDE 0.9 % IV SOLN
200.0000 mg | Freq: Once | INTRAVENOUS | Status: AC
  Administered 2020-06-21: 200 mg via INTRAVENOUS
  Filled 2020-06-21: qty 8

## 2020-06-21 MED ORDER — HEPARIN SOD (PORK) LOCK FLUSH 100 UNIT/ML IV SOLN
500.0000 [IU] | Freq: Once | INTRAVENOUS | Status: AC | PRN
  Administered 2020-06-21: 500 [IU]
  Filled 2020-06-21: qty 5

## 2020-06-21 NOTE — Patient Instructions (Signed)
[facility] Discharge Instructions for Patients Receiving Chemotherapy  Today you received the following chemotherapy agents Alimta, Keytruda and Carboplatin  To help prevent nausea and vomiting after your treatment, we encourage you to take your nausea medication as prescribed by MD.   If you develop nausea and vomiting that is not controlled by your nausea medication, call the clinic.   BELOW ARE SYMPTOMS THAT SHOULD BE REPORTED IMMEDIATELY:  *FEVER GREATER THAN 100.5 F  *CHILLS WITH OR WITHOUT FEVER  NAUSEA AND VOMITING THAT IS NOT CONTROLLED WITH YOUR NAUSEA MEDICATION  *UNUSUAL SHORTNESS OF BREATH  *UNUSUAL BRUISING OR BLEEDING  TENDERNESS IN MOUTH AND THROAT WITH OR WITHOUT PRESENCE OF ULCERS  *URINARY PROBLEMS  *BOWEL PROBLEMS  UNUSUAL RASH Items with * indicate a potential emergency and should be followed up as soon as possible.  Feel free to call the clinic should you have any questions or concerns. The clinic phone number is [phone].  Please show the [facility] at check-in to the Emergency Department and triage nurse.

## 2020-06-21 NOTE — Progress Notes (Signed)
Hematology and Oncology Follow Up Visit  Jerome Kelley [id number] August 26, 1940 79 y.o. 06/21/2020   Principle Diagnosis:  Stage IIIB (T1aN3M0)adenocarcinoma of the right middle lung History of stage Ia(T1aN0M0)adenocarcinoma the right lung - resected in March 2011 History of stage II adenocarcinoma of the colon-high risk - April 2010  Current Therapy: Carboplatin/Alimta/Pembrolizumab - started 05/10/2020- s/p cycle 2   Interim History:  Jerome Kelley is here today for follow-up and treatment. He is doing well and states that when he experiences mild fatigue or SOB with exertion he takes a break to rest as needed.  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  He denies fever, chills, n/v, cough, rash, dizziness, chest pain, palpitations, abdominal pain or changes in bowel or bladder habits.  He takes a stool softener as needed for constipation which he states has been better lately.  No episodes of blood loss noted. No abnormal bruising, no petechiae.  No swelling, tenderness, numbness or tingling in his extremities.  Pedal pulses are 2+.  No falls or syncopal episodes to report.  He has maintained a good appetite and he is doing his best to stay well hydrated. His weight is stable at 155 lbs.   ECOG Performance Status: 1 - Symptomatic but completely ambulatory  Medications:  Allergies as of 06/21/2020   No Known Allergies     Medication List       Accurate as of June 21, 2020  9:39 AM. If you have any questions, ask your nurse or doctor.        aspirin 81 MG chewable tablet Chew 1 tablet (81 mg total) by mouth 2 (two) times daily.   benazepril 20 MG tablet Commonly known as: LOTENSIN Take 20 mg by mouth daily.   dexamethasone 4 MG tablet Commonly known as: DECADRON Take 1 tab two times a day the day before Alimta chemo, then take 2 tabs once a day for 3 days starting the day after cisplatin.   folic acid 1 MG tablet Commonly known as:  FOLVITE Take 1 tablet (1 mg total) by mouth daily. Start 5-7 days before Alimta chemotherapy. Continue until 21 days after Alimta completed.   lidocaine-prilocaine cream Commonly known as: EMLA Apply to affected area once   ondansetron 8 MG tablet Commonly known as: Zofran Take 1 tablet (8 mg total) by mouth 2 (two) times daily as needed (Nausea or vomiting). Start if needed on the third day after cisplatin.   prochlorperazine 10 MG tablet Commonly known as: COMPAZINE Take 1 tablet (10 mg total) by mouth every 6 (six) hours as needed (Nausea or vomiting).   vitamin B-12 1000 MCG tablet Commonly known as: CYANOCOBALAMIN Take 1 tablet (1,000 mcg total) by mouth daily.       Allergies: No Known Allergies  Past Medical History, Surgical history, Social history, and Family History were reviewed and updated.  Review of Systems: All other 10 point review of systems is negative.   Physical Exam:  vitals were not taken for this visit.   Wt Readings from Last 3 Encounters:  05/31/20 154 lb (69.9 kg)  04/30/20 152 lb 6.4 oz (69.1 kg)  04/02/20 170 lb (77.1 kg)    Ocular: Sclerae unicteric, pupils equal, round and reactive to light Ear-nose-throat: Oropharynx clear, dentition fair Lymphatic: No cervical or supraclavicular adenopathy Lungs no rales or rhonchi, good excursion bilaterally Heart regular rate and rhythm, no murmur appreciated Abd soft, nontender, positive bowel sounds, no liver or spleen tip palpated  on exam, no fluid wave  MSK no focal spinal tenderness, no joint edema Neuro: non-focal, well-oriented, appropriate affect Breasts: Deferred   Lab Results  Component Value Date   WBC 5.8 05/31/2020   HGB 11.3 (L) 05/31/2020   HCT 33.1 (L) 05/31/2020   MCV 95.9 05/31/2020   PLT 238 05/31/2020   Lab Results  Component Value Date   FERRITIN 337 (H) 10/20/2018   IRON 34 (L) 10/20/2018   TIBC 241 (L) 10/20/2018   UIBC 207 10/20/2018   IRONPCTSAT 14 (L) 10/20/2018     Lab Results  Component Value Date   RBC 3.45 (L) 05/31/2020   No results found for: KPAFRELGTCHN, LAMBDASER, KAPLAMBRATIO No results found for: IGGSERUM, IGA, IGMSERUM No results found for: Odetta Pink, SPEI   Chemistry      Component Value Date/Time   NA 142 05/31/2020 0915   NA 140 06/28/2011 0950   K 3.6 05/31/2020 0915   K 4.7 06/28/2011 0950   CL 108 05/31/2020 0915   CL 100 06/28/2011 0950   CO2 28 05/31/2020 0915   CO2 29 06/28/2011 0950   BUN 11 05/31/2020 0915   BUN 10 06/28/2011 0950   CREATININE 0.73 05/31/2020 0915   CREATININE 0.7 06/28/2011 0950      Component Value Date/Time   CALCIUM 9.8 05/31/2020 0915   CALCIUM 9.0 06/28/2011 0950   ALKPHOS 99 05/31/2020 0915   ALKPHOS 110 (H) 12/22/2010 0850   AST 9 (L) 05/31/2020 0915   ALT 7 05/31/2020 0915   ALT 18 12/22/2010 0850   BILITOT 0.4 05/31/2020 0915       Impression and Plan: Jerome Kelley is a very pleasant 79 yo caucasian gentleman with previous history of right lung cancer as well as colon cancer 10 years ago.  He now has stage IIIb adenocarcinoma of the lung, right middle lobe.  We will proceed with cycle 3 today as planned and repeat his PET scan in another 2-3 weeks to assess his response. Goal is still to complete 6 cycles of chemotherapy and then re-evaluate need for radiation therapy.  Iron studies are pending. We will replace if needed.  Follow-up in 3 weeks.  He was encouraged to contact our office with any questions or concerns. We can certainly see him sooner if needed.     Laverna Peace, NP 11/8/20219:39 AM

## 2020-06-21 NOTE — Progress Notes (Signed)
Pt discharged in no apparent distress. Pt left ambulatory without assistance. Pt aware of discharge instructions and verbalized understanding and had no further questions.  

## 2020-06-21 NOTE — Patient Instructions (Addendum)

## 2020-06-22 ENCOUNTER — Encounter: Payer: Self-pay | Admitting: *Deleted

## 2020-06-22 LAB — T4: T4, Total: 7.3 ug/dL (ref 4.5–12.0)

## 2020-06-22 NOTE — Progress Notes (Signed)
Reviewed patient visit from yesterday. Patient needs a PET scan scheduled prior to next cycle. Scheduled for 07/05/2020.  Called and spoke to patient's wife. Appointment date, time and location reviewed. She is aware that patient needs to arrive at 1030a and that patient is NPO except sips of water. PET information sheet also mailed to patient home for education reinforcement.   Oncology Nurse Navigator Documentation  Oncology Nurse Navigator Flowsheets 06/22/2020  Abnormal Finding Date -  Confirmed Diagnosis Date -  Diagnosis Status -  Planned Course of Treatment -  Phase of Treatment -  Chemotherapy Actual Start Date: -  Navigator Follow Up Date: 07/05/2020  Navigator Follow Up Reason: Scan Review  Navigator Location [facility]  Referral Date to RadOnc/MedOnc -  Navigator Encounter Type Appt/Treatment Plan Review;Telephone  Telephone Outgoing Call;Appt Confirmation/Clarification  Treatment Initiated Date -  Patient Visit Type MedOnc  Treatment Phase Active Tx  Barriers/Navigation Needs Coordination of Care;Education  Education Other  Interventions Coordination of Care;Education  Acuity Level 2-Minimal Needs (1-2 Barriers Identified)  Referrals -  Coordination of Care Appts;Radiology  Education Method Verbal;Written  Support Groups/Services Friends and Family  Time Spent with Patient 30

## 2020-06-30 DIAGNOSIS — Z23 Encounter for immunization: Secondary | ICD-10-CM | POA: Diagnosis not present

## 2020-07-05 ENCOUNTER — Other Ambulatory Visit: Payer: Self-pay

## 2020-07-05 ENCOUNTER — Encounter (HOSPITAL_COMMUNITY)
Admission: RE | Admit: 2020-07-05 | Discharge: 2020-07-05 | Disposition: A | Discharge status: Home / Self Care | Payer: Medicare Other | Source: Ambulatory Visit | Attending: Family | Admitting: Family

## 2020-07-05 DIAGNOSIS — R911 Solitary pulmonary nodule: Secondary | ICD-10-CM | POA: Diagnosis not present

## 2020-07-05 DIAGNOSIS — I2510 Atherosclerotic heart disease of native coronary artery without angina pectoris: Secondary | ICD-10-CM | POA: Diagnosis not present

## 2020-07-05 DIAGNOSIS — C3490 Malignant neoplasm of unspecified part of unspecified bronchus or lung: Secondary | ICD-10-CM | POA: Diagnosis not present

## 2020-07-05 DIAGNOSIS — I6521 Occlusion and stenosis of right carotid artery: Secondary | ICD-10-CM | POA: Diagnosis not present

## 2020-07-05 DIAGNOSIS — C3411 Malignant neoplasm of upper lobe, right bronchus or lung: Secondary | ICD-10-CM | POA: Diagnosis not present

## 2020-07-05 LAB — GLUCOSE, CAPILLARY
Glucose-Capillary: 59 mg/dL — ABNORMAL LOW (ref 70–99)
Glucose-Capillary: 91 mg/dL (ref 70–99)

## 2020-07-05 IMAGING — CT NM PET TUM IMG RESTAG (PS) SKULL BASE T - THIGH
1 of 9 series · 2 of 25 positions shown · non-contrast
Comparison: PET-CT [DATE]

CLINICAL DATA: Subsequent treatment strategy for non-small cell
lung cancer. Patient undergoing chemotherapy.

EXAM:
NUCLEAR MEDICINE PET SKULL BASE TO THIGH
TECHNIQUE: 7.7 mCi F-18 FDG was injected intravenously. Full-ring PET imaging
was performed from the skull base to thigh after the radiotracer. CT
data was obtained and used for attenuation correction and anatomic
localization.
Fasting blood glucose: 91 mg/dl

[Series 4: ct sk_thigh 5.0 bf37 · axial · 5.0mm · 0.98mm/px · z∈[-835,-599]mm · 2 of 236 slices shown]
[im 59/236  brain]
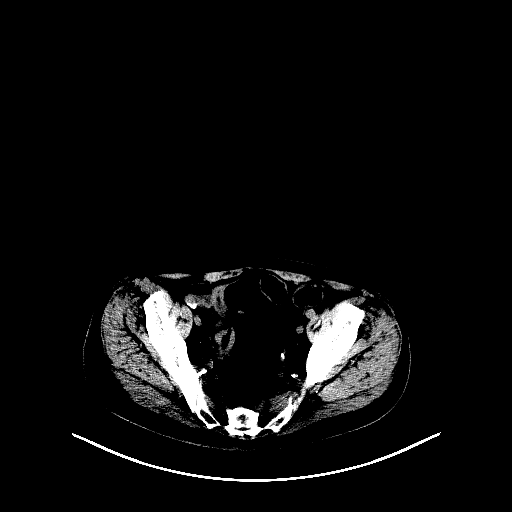
[im 118/236  brain]
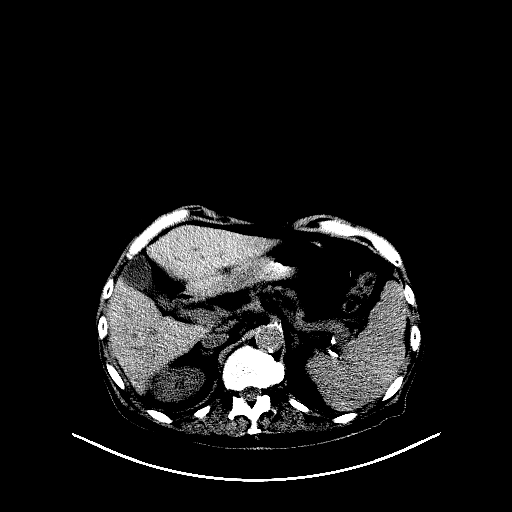

[2 of 25 positions shown; findings below may reference images not displayed]

FINDINGS: Mediastinal blood pool activity: SUV max

Liver activity: SUV max NA

NECK: No hypermetabolic lymph nodes in the neck.

Fairly marked hypermetabolism noted in the right masseter muscle
near its attachment on the mandible. This could be a muscle injury
or inflammatory process. No mass on the CT scan.

Incidental CT findings: Stable carotid artery calcifications with a
right-sided stent.

CHEST: The right middle lobe pulmonary nodule now measures 5 mm and
previously measured 9 mm. It is no longer hypermetabolic.

Precarinal lymph node on image 73/4 measures 7 mm and previously
measured 15 mm. SUV max is 17.7 and was previously 23.16.

Right hilar lymph node is difficult to measure. SUV max is 5.45 and
was previously 10.32.

Subcarinal lymph node on image 81/4 measures 11 mm and previously
measured 15.5 mm. SUV max is 7.22 and was previously 25.21.

Right paraesophageal lymph node on image 91/4 measures 6.5 mm and
previously measured 9 mm. SUV max is 7.85 and was previously 23.65.

Anterior mediastinal node seen on the prior study was hypermetabolic
with SUV max of 12.89. This is no longer identified.

The right subclavicular lymph node has also resolved. No
hypermetabolism.

3.5 mm high right paratracheal node on image number 55/4 previously
measured 7 mm. It was hypermetabolic on the prior study with an SUV
max of 16.0 and is no longer hypermetabolic.

A few scattered axillary lymph nodes are weakly hypermetabolic and
likely reactive/inflammatory.

Incidental CT findings: Stable advanced vascular calcifications and
stable numerous calcified mediastinal and hilar lymph nodes. Stable
extensive pleural calcification involving the left hemithorax likely
due to prior pleurodesis.

ABDOMEN/PELVIS: No abnormal hypermetabolic activity within the
liver, pancreas, adrenal glands, or spleen. No hypermetabolic lymph
nodes in the abdomen or pelvis.

Incidental CT findings: Stable mild splenomegaly. Stable advanced
vascular calcifications. Sigmoid colon diverticulosis without
findings for diverticulitis.

SKELETON: No findings suspicious for osseous metastatic disease.

Incidental CT findings: none
IMPRESSION: 1. Findings suggest a good response to treatment as detailed above.
The right middle lobe pulmonary nodule is much smaller and no longer
hypermetabolic. There are persistent hypermetabolic mediastinal and
hilar lymph nodes but these are smaller and less hypermetabolic when
compared to the prior study.
2. No findings for abdominal/pelvic metastatic disease or osseous
metastatic disease.

## 2020-07-05 MED ORDER — FLUDEOXYGLUCOSE F - 18 (FDG) INJECTION
7.7000 | Freq: Once | INTRAVENOUS | Status: AC | PRN
  Administered 2020-07-05: 7.7 via INTRAVENOUS

## 2020-07-06 ENCOUNTER — Encounter: Payer: Self-pay | Admitting: *Deleted

## 2020-07-06 ENCOUNTER — Telehealth: Payer: Self-pay | Admitting: *Deleted

## 2020-07-06 NOTE — Telephone Encounter (Signed)
As noted below by Dr. Marin Olp, I informed the patient that the cancer is smaller!! He verbalized understanding.

## 2020-07-06 NOTE — Progress Notes (Signed)
Oncology Nurse Navigator Documentation  Oncology Nurse Navigator Flowsheets 07/06/2020  Abnormal Finding Date -  Confirmed Diagnosis Date -  Diagnosis Status -  Planned Course of Treatment -  Phase of Treatment -  Chemotherapy Actual Start Date: -  Navigator Follow Up Date: 07/13/2020  Navigator Follow Up Reason: Scan Review;Follow-up Appointment  Navigator Location [facility]  Referral Date to RadOnc/MedOnc -  Navigator Encounter Type Scan Review  Telephone -  Treatment Initiated Date -  Patient Visit Type MedOnc  Treatment Phase Active Tx  Barriers/Navigation Needs Coordination of Care;Education  Education -  Interventions None Required  Acuity Level 2-Minimal Needs (1-2 Barriers Identified)  Referrals -  Coordination of Care -  Education Method -  Support Groups/Services Friends and Family  Time Spent with Patient 15

## 2020-07-06 NOTE — Telephone Encounter (Signed)
-----   Message from [name], MD sent at 07/06/2020 11:11 AM EST ----- Call - the cancer is smaller!!!  This is great news!!!  Happy Thanksgiving!!  Jerome Kelley

## 2020-07-13 ENCOUNTER — Inpatient Hospital Stay: Payer: Medicare Other

## 2020-07-13 ENCOUNTER — Encounter: Payer: Self-pay | Admitting: *Deleted

## 2020-07-13 ENCOUNTER — Other Ambulatory Visit: Payer: Self-pay

## 2020-07-13 ENCOUNTER — Telehealth: Payer: Self-pay

## 2020-07-13 ENCOUNTER — Encounter: Payer: Self-pay | Admitting: Hematology & Oncology

## 2020-07-13 ENCOUNTER — Inpatient Hospital Stay (HOSPITAL_BASED_OUTPATIENT_CLINIC_OR_DEPARTMENT_OTHER): Payer: Medicare Other | Admitting: Hematology & Oncology

## 2020-07-13 VITALS — BP 142/53 | HR 64 | Temp 98.0°F | Resp 18 | Wt 157.0 lb

## 2020-07-13 DIAGNOSIS — Z5112 Encounter for antineoplastic immunotherapy: Secondary | ICD-10-CM | POA: Diagnosis not present

## 2020-07-13 DIAGNOSIS — Z5111 Encounter for antineoplastic chemotherapy: Secondary | ICD-10-CM | POA: Diagnosis not present

## 2020-07-13 DIAGNOSIS — C3411 Malignant neoplasm of upper lobe, right bronchus or lung: Secondary | ICD-10-CM

## 2020-07-13 DIAGNOSIS — D508 Other iron deficiency anemias: Secondary | ICD-10-CM

## 2020-07-13 DIAGNOSIS — C342 Malignant neoplasm of middle lobe, bronchus or lung: Secondary | ICD-10-CM | POA: Diagnosis not present

## 2020-07-13 DIAGNOSIS — Z79899 Other long term (current) drug therapy: Secondary | ICD-10-CM | POA: Diagnosis not present

## 2020-07-13 DIAGNOSIS — C3410 Malignant neoplasm of upper lobe, unspecified bronchus or lung: Secondary | ICD-10-CM | POA: Diagnosis not present

## 2020-07-13 DIAGNOSIS — E032 Hypothyroidism due to medicaments and other exogenous substances: Secondary | ICD-10-CM

## 2020-07-13 LAB — CBC WITH DIFFERENTIAL (CANCER CENTER ONLY)
Abs Immature Granulocytes: 0.03 10*3/uL (ref 0.00–0.07)
Basophils Absolute: 0 10*3/uL (ref 0.0–0.1)
Basophils Relative: 0 %
Eosinophils Absolute: 0 10*3/uL (ref 0.0–0.5)
Eosinophils Relative: 0 %
HCT: 26.5 % — ABNORMAL LOW (ref 39.0–52.0)
Hemoglobin: 8.8 g/dL — ABNORMAL LOW (ref 13.0–17.0)
Immature Granulocytes: 1 %
Lymphocytes Relative: 29 %
Lymphs Abs: 1.2 10*3/uL (ref 0.7–4.0)
MCH: 33.8 pg (ref 26.0–34.0)
MCHC: 33.2 g/dL (ref 30.0–36.0)
MCV: 101.9 fL — ABNORMAL HIGH (ref 80.0–100.0)
Monocytes Absolute: 0.8 10*3/uL (ref 0.1–1.0)
Monocytes Relative: 20 %
Neutro Abs: 2 10*3/uL (ref 1.7–7.7)
Neutrophils Relative %: 50 %
Platelet Count: 196 10*3/uL (ref 150–400)
RBC: 2.6 MIL/uL — ABNORMAL LOW (ref 4.22–5.81)
RDW: 19.1 % — ABNORMAL HIGH (ref 11.5–15.5)
WBC Count: 4 10*3/uL (ref 4.0–10.5)
nRBC: 0 % (ref 0.0–0.2)

## 2020-07-13 LAB — CMP (CANCER CENTER ONLY)
ALT: 8 U/L (ref 0–44)
AST: 11 U/L — ABNORMAL LOW (ref 15–41)
Albumin: 3.4 g/dL — ABNORMAL LOW (ref 3.5–5.0)
Alkaline Phosphatase: 106 U/L (ref 38–126)
Anion gap: 6 (ref 5–15)
BUN: 10 mg/dL (ref 8–23)
CO2: 27 mmol/L (ref 22–32)
Calcium: 9.2 mg/dL (ref 8.9–10.3)
Chloride: 107 mmol/L (ref 98–111)
Creatinine: 0.69 mg/dL (ref 0.61–1.24)
GFR, Estimated: >60 mL/min (ref >60)
Glucose, Bld: 77 mg/dL (ref 70–99)
Potassium: 3.8 mmol/L (ref 3.5–5.1)
Sodium: 140 mmol/L (ref 135–145)
Total Bilirubin: 0.5 mg/dL (ref 0.3–1.2)
Total Protein: 5.8 g/dL — ABNORMAL LOW (ref 6.5–8.1)

## 2020-07-13 LAB — IRON AND TIBC
Iron: 71 ug/dL (ref 42–163)
Saturation Ratios: 30 % (ref 20–55)
TIBC: 239 ug/dL (ref 202–409)
UIBC: 168 ug/dL (ref 117–376)

## 2020-07-13 LAB — TSH: TSH: 1.038 u[IU]/mL (ref 0.320–4.118)

## 2020-07-13 LAB — FERRITIN: Ferritin: 395 ng/mL — ABNORMAL HIGH (ref 24–336)

## 2020-07-13 MED ORDER — HEPARIN SOD (PORK) LOCK FLUSH 100 UNIT/ML IV SOLN
500.0000 [IU] | Freq: Once | INTRAVENOUS | Status: AC | PRN
  Administered 2020-07-13: 500 [IU]
  Filled 2020-07-13: qty 5

## 2020-07-13 MED ORDER — PALONOSETRON HCL INJECTION 0.25 MG/5ML
INTRAVENOUS | Status: AC
  Filled 2020-07-13: qty 5

## 2020-07-13 MED ORDER — SODIUM CHLORIDE 0.9% FLUSH
10.0000 mL | INTRAVENOUS | Status: DC | PRN
  Administered 2020-07-13: 10 mL
  Filled 2020-07-13: qty 10

## 2020-07-13 MED ORDER — SODIUM CHLORIDE 0.9 % IV SOLN
200.0000 mg | Freq: Once | INTRAVENOUS | Status: AC
  Administered 2020-07-13: 200 mg via INTRAVENOUS
  Filled 2020-07-13: qty 8

## 2020-07-13 MED ORDER — SODIUM CHLORIDE 0.9 % IV SOLN
420.0000 mg | Freq: Once | INTRAVENOUS | Status: AC
  Administered 2020-07-13: 420 mg via INTRAVENOUS
  Filled 2020-07-13: qty 42

## 2020-07-13 MED ORDER — SODIUM CHLORIDE 0.9% FLUSH
10.0000 mL | Freq: Once | INTRAVENOUS | Status: AC
  Administered 2020-07-13: 10 mL via INTRAVENOUS
  Filled 2020-07-13: qty 10

## 2020-07-13 MED ORDER — SODIUM CHLORIDE 0.9 % IV SOLN
400.0000 mg/m2 | Freq: Once | INTRAVENOUS | Status: AC
  Administered 2020-07-13: 700 mg via INTRAVENOUS
  Filled 2020-07-13: qty 20

## 2020-07-13 MED ORDER — SODIUM CHLORIDE 0.9 % IV SOLN
150.0000 mg | Freq: Once | INTRAVENOUS | Status: AC
  Administered 2020-07-13: 150 mg via INTRAVENOUS
  Filled 2020-07-13: qty 5

## 2020-07-13 MED ORDER — PALONOSETRON HCL INJECTION 0.25 MG/5ML
0.2500 mg | Freq: Once | INTRAVENOUS | Status: AC
  Administered 2020-07-13: 0.25 mg via INTRAVENOUS

## 2020-07-13 MED ORDER — SODIUM CHLORIDE 0.9 % IV SOLN
Freq: Once | INTRAVENOUS | Status: AC
  Filled 2020-07-13: qty 250

## 2020-07-13 MED ORDER — SODIUM CHLORIDE 0.9 % IV SOLN
10.0000 mg | Freq: Once | INTRAVENOUS | Status: AC
  Administered 2020-07-13: 10 mg via INTRAVENOUS
  Filled 2020-07-13: qty 10

## 2020-07-13 NOTE — Progress Notes (Signed)
Oncology Nurse Navigator Documentation  Oncology Nurse Navigator Flowsheets 07/13/2020  Abnormal Finding Date -  Confirmed Diagnosis Date -  Diagnosis Status -  Planned Course of Treatment -  Phase of Treatment -  Chemotherapy Actual Start Date: -  Navigator Follow Up Date: 08/02/2020  Navigator Follow Up Reason: Follow-up Appointment  Navigator Location [facility]  Referral Date to RadOnc/MedOnc -  Navigator Encounter Type Appt/Treatment Plan Review  Telephone -  Treatment Initiated Date -  Patient Visit Type MedOnc  Treatment Phase Active Tx  Barriers/Navigation Needs Coordination of Care;Education  Education -  Interventions None Required  Acuity Level 2-Minimal Needs (1-2 Barriers Identified)  Referrals -  Coordination of Care -  Education Method -  Support Groups/Services Friends and Family  Time Spent with Patient 15

## 2020-07-13 NOTE — Patient Instructions (Addendum)
[facility] Discharge Instructions for Patients Receiving Chemotherapy  Today you received the following chemotherapy agents Keytruda, Carboplatin, Alimta  To help prevent nausea and vomiting after your treatment, we encourage you to take your nausea medication    If you develop nausea and vomiting that is not controlled by your nausea medication, call the clinic.   BELOW ARE SYMPTOMS THAT SHOULD BE REPORTED IMMEDIATELY:  *FEVER GREATER THAN 100.5 F  *CHILLS WITH OR WITHOUT FEVER  NAUSEA AND VOMITING THAT IS NOT CONTROLLED WITH YOUR NAUSEA MEDICATION  *UNUSUAL SHORTNESS OF BREATH  *UNUSUAL BRUISING OR BLEEDING  TENDERNESS IN MOUTH AND THROAT WITH OR WITHOUT PRESENCE OF ULCERS  *URINARY PROBLEMS  *BOWEL PROBLEMS  UNUSUAL RASH Items with * indicate a potential emergency and should be followed up as soon as possible.  Feel free to call the clinic should you have any questions or concerns. The clinic phone number is [phone].  Please show the [facility] at check-in to the Emergency Department and triage nurse.  Pt discharged in no apparent distress. Pt left ambulatory without assistance. Pt aware of discharge instructions and verbalized understanding and had no further questions.

## 2020-07-13 NOTE — Progress Notes (Signed)
Pt discharged in no apparent distress. Pt left ambulatory with/without assistance. Pt aware of discharge instructions andverbalized understanding and had no further questions.

## 2020-07-13 NOTE — Telephone Encounter (Signed)
07/13/20 LOS appts already In place for 08/02/20, 08/24/20 appts made today..... AOM

## 2020-07-13 NOTE — Progress Notes (Signed)
Hematology and Oncology Follow Up Visit  LAIKEN NOHR [id number] [birth year] 79 y.o. 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 Rufo is here today for follow-up.  He comes in with his wife.  He is doing fairly well.  Unfortunately, he is still smoking about a pack per day.  I am not sure if we will ever stop smoking.  Thankfully, his PET scan looked great.  He has had a very nice response to date.  His tumors have regressed nicely.  The SUV is held is also come down nicely.  He has had no cough.  He has had no chest wall pain.  He has had no bleeding.  He has tolerated chemotherapy quite nicely.  He has had no problems with bowels or bladder.  I think he may have little bit of constipation.  There is been no issues with rashes.  There has been a little bit of leg swelling.  Overall, I would say his performance status is ECOG 1.  Medications:  Allergies as of 07/13/2020   No Known Allergies     Medication List       Accurate as of July 13, 2020 11:11 AM. If you have any questions, ask your nurse or doctor.        aspirin 81 MG chewable tablet Chew 1 tablet (81 mg total) by mouth 2 (two) times daily.   benazepril 20 MG tablet Commonly known as: LOTENSIN Take 20 mg by mouth daily.   dexamethasone 4 MG tablet Commonly known as: DECADRON Take 1 tab two times a day the day before Alimta chemo, then take 2 tabs once a day for 3 days starting the day after cisplatin.   folic acid 1 MG tablet Commonly known as: FOLVITE Take 1 tablet (1 mg total) by mouth daily. Start 5-7 days before Alimta chemotherapy. Continue until 21 days after Alimta  completed.   lidocaine-prilocaine cream Commonly known as: EMLA Apply to affected area once   ondansetron 8 MG tablet Commonly known as: Zofran Take 1 tablet (8 mg total) by mouth 2 (two) times daily as needed (Nausea or vomiting). Start if needed on the third day after cisplatin.   prochlorperazine 10 MG tablet Commonly known as: COMPAZINE Take 1 tablet (10 mg total) by mouth every 6 (six) hours as needed (Nausea or vomiting).   vitamin B-12 1000 MCG tablet Commonly known as: CYANOCOBALAMIN Take 1 tablet (1,000 mcg total) by mouth daily.       Allergies: No Known Allergies  Past Medical History, Surgical history, Social history, and Family History were reviewed and updated.  Review of Systems: Review of Systems  Constitutional: Negative.   HENT: Negative.   Eyes: Negative.   Respiratory: Negative.   Cardiovascular: Negative.   Gastrointestinal: Negative.   Genitourinary: Negative.   Musculoskeletal: Negative.   Skin: Negative.   Neurological: Negative.   Endo/Heme/Allergies: Negative.   Psychiatric/Behavioral: Negative.      Physical Exam:  weight is 157 lb (71.2 kg). His temperature is 98 F (36.7 C). His blood pressure is 142/53 (abnormal) and his pulse is 64. His respiration is 18 and oxygen saturation is 100%.   Wt Readings from Last 3 Encounters:  07/13/20 157 lb (71.2  kg)  06/21/20 155 lb 6.4 oz (70.5 kg)  05/31/20 154 lb (69.9 kg)    Physical Exam Vitals reviewed.  HENT:     Head: Normocephalic and atraumatic.  Eyes:     Pupils: Pupils are equal, round, and reactive to light.  Cardiovascular:     Rate and Rhythm: Normal rate and regular rhythm.     Heart sounds: Normal heart sounds.  Pulmonary:     Effort: Pulmonary effort is normal.     Breath sounds: Normal breath sounds.  Abdominal:     General: Bowel sounds are normal.     Palpations: Abdomen is soft.  Musculoskeletal:        General: No tenderness or deformity. Normal range of motion.      Cervical back: Normal range of motion.  Lymphadenopathy:     Cervical: No cervical adenopathy.  Skin:    General: Skin is warm and dry.     Findings: No erythema or rash.  Neurological:     Mental Status: He is alert and oriented to person, place, and time.  Psychiatric:        Behavior: Behavior normal.        Thought Content: Thought content normal.        Judgment: Judgment normal.      Lab Results  Component Value Date   WBC 4.0 07/13/2020   HGB 8.8 (L) 07/13/2020   HCT 26.5 (L) 07/13/2020   MCV 101.9 (H) 07/13/2020   PLT 196 07/13/2020   Lab Results  Component Value Date   FERRITIN 363 (H) 06/21/2020   IRON 88 06/21/2020   TIBC 236 06/21/2020   UIBC 148 06/21/2020   IRONPCTSAT 37 06/21/2020   Lab Results  Component Value Date   RBC 2.60 (L) 07/13/2020   No results found for: KPAFRELGTCHN, LAMBDASER, KAPLAMBRATIO No results found for: IGGSERUM, IGA, IGMSERUM No results found for: Odetta Pink, SPEI   Chemistry      Component Value Date/Time   NA 140 07/13/2020 1015   NA 140 06/28/2011 0950   K 3.8 07/13/2020 1015   K 4.7 06/28/2011 0950   CL 107 07/13/2020 1015   CL 100 06/28/2011 0950   CO2 27 07/13/2020 1015   CO2 29 06/28/2011 0950   BUN 10 07/13/2020 1015   BUN 10 06/28/2011 0950   CREATININE 0.69 07/13/2020 1015   CREATININE 0.7 06/28/2011 0950      Component Value Date/Time   CALCIUM 9.2 07/13/2020 1015   CALCIUM 9.0 06/28/2011 0950   ALKPHOS 106 07/13/2020 1015   ALKPHOS 110 (H) 12/22/2010 0850   AST 11 (L) 07/13/2020 1015   ALT 8 07/13/2020 1015   ALT 18 12/22/2010 0850   BILITOT 0.5 07/13/2020 1015       Impression and Plan: Mr. Northup is a very pleasant 78 yo caucasian gentleman with previous history of right lung cancer as well as colon cancer 10 years ago.   He now has stage IIIb adenocarcinoma of the lung, right middle lobe.   We will proceed with cycle 4 today as  planned.  We will still try to go for 6 cycles of treatment.  After that, we will repeat his PET scan and see if we need to think about radiation therapy.  I am just happy that he is responded.  I am just happy that his quality of life is doing well.  We will plan to get him back in 3  more weeks for his 5th cycle of treatment.     [name], MD 11/30/202111:11 AM

## 2020-07-14 LAB — T4: T4, Total: 7.1 ug/dL (ref 4.5–12.0)

## 2020-07-22 DIAGNOSIS — Z23 Encounter for immunization: Secondary | ICD-10-CM | POA: Diagnosis not present

## 2020-07-22 DIAGNOSIS — Z85038 Personal history of other malignant neoplasm of large intestine: Secondary | ICD-10-CM | POA: Diagnosis not present

## 2020-07-22 DIAGNOSIS — I10 Essential (primary) hypertension: Secondary | ICD-10-CM | POA: Diagnosis not present

## 2020-07-22 DIAGNOSIS — F17210 Nicotine dependence, cigarettes, uncomplicated: Secondary | ICD-10-CM | POA: Diagnosis not present

## 2020-07-22 DIAGNOSIS — J449 Chronic obstructive pulmonary disease, unspecified: Secondary | ICD-10-CM | POA: Diagnosis not present

## 2020-07-22 DIAGNOSIS — R972 Elevated prostate specific antigen [PSA]: Secondary | ICD-10-CM | POA: Diagnosis not present

## 2020-07-22 DIAGNOSIS — C3490 Malignant neoplasm of unspecified part of unspecified bronchus or lung: Secondary | ICD-10-CM | POA: Diagnosis not present

## 2020-08-02 ENCOUNTER — Inpatient Hospital Stay: Payer: Medicare Other

## 2020-08-02 ENCOUNTER — Telehealth: Payer: Self-pay | Admitting: Hematology & Oncology

## 2020-08-02 ENCOUNTER — Encounter: Payer: Self-pay | Admitting: Hematology & Oncology

## 2020-08-02 ENCOUNTER — Encounter: Payer: Self-pay | Admitting: *Deleted

## 2020-08-02 ENCOUNTER — Other Ambulatory Visit: Payer: Self-pay

## 2020-08-02 ENCOUNTER — Inpatient Hospital Stay: Payer: Medicare Other | Attending: Hematology & Oncology

## 2020-08-02 ENCOUNTER — Inpatient Hospital Stay (HOSPITAL_BASED_OUTPATIENT_CLINIC_OR_DEPARTMENT_OTHER): Payer: Medicare Other | Admitting: Hematology & Oncology

## 2020-08-02 VITALS — BP 174/60 | HR 68 | Temp 97.6°F | Resp 17 | Wt 156.0 lb

## 2020-08-02 DIAGNOSIS — C3410 Malignant neoplasm of upper lobe, unspecified bronchus or lung: Secondary | ICD-10-CM

## 2020-08-02 DIAGNOSIS — C3411 Malignant neoplasm of upper lobe, right bronchus or lung: Secondary | ICD-10-CM

## 2020-08-02 DIAGNOSIS — Z5111 Encounter for antineoplastic chemotherapy: Secondary | ICD-10-CM | POA: Insufficient documentation

## 2020-08-02 DIAGNOSIS — Z5112 Encounter for antineoplastic immunotherapy: Secondary | ICD-10-CM | POA: Diagnosis not present

## 2020-08-02 DIAGNOSIS — C342 Malignant neoplasm of middle lobe, bronchus or lung: Secondary | ICD-10-CM | POA: Insufficient documentation

## 2020-08-02 DIAGNOSIS — Z79899 Other long term (current) drug therapy: Secondary | ICD-10-CM | POA: Diagnosis not present

## 2020-08-02 LAB — CBC WITH DIFFERENTIAL (CANCER CENTER ONLY)
Abs Immature Granulocytes: 0.04 10*3/uL (ref 0.00–0.07)
Basophils Absolute: 0 10*3/uL (ref 0.0–0.1)
Basophils Relative: 0 %
Eosinophils Absolute: 0 10*3/uL (ref 0.0–0.5)
Eosinophils Relative: 1 %
HCT: 25.6 % — ABNORMAL LOW (ref 39.0–52.0)
Hemoglobin: 8.3 g/dL — ABNORMAL LOW (ref 13.0–17.0)
Immature Granulocytes: 1 %
Lymphocytes Relative: 32 %
Lymphs Abs: 0.9 10*3/uL (ref 0.7–4.0)
MCH: 33.7 pg (ref 26.0–34.0)
MCHC: 32.4 g/dL (ref 30.0–36.0)
MCV: 104.1 fL — ABNORMAL HIGH (ref 80.0–100.0)
Monocytes Absolute: 0.5 10*3/uL (ref 0.1–1.0)
Monocytes Relative: 16 %
Neutro Abs: 1.4 10*3/uL — ABNORMAL LOW (ref 1.7–7.7)
Neutrophils Relative %: 50 %
Platelet Count: 110 10*3/uL — ABNORMAL LOW (ref 150–400)
RBC: 2.46 MIL/uL — ABNORMAL LOW (ref 4.22–5.81)
RDW: 19 % — ABNORMAL HIGH (ref 11.5–15.5)
WBC Count: 2.9 10*3/uL — ABNORMAL LOW (ref 4.0–10.5)
nRBC: 0 % (ref 0.0–0.2)

## 2020-08-02 LAB — CMP (CANCER CENTER ONLY)
ALT: 9 U/L (ref 0–44)
AST: 11 U/L — ABNORMAL LOW (ref 15–41)
Albumin: 3.4 g/dL — ABNORMAL LOW (ref 3.5–5.0)
Alkaline Phosphatase: 103 U/L (ref 38–126)
Anion gap: 8 (ref 5–15)
BUN: 12 mg/dL (ref 8–23)
CO2: 26 mmol/L (ref 22–32)
Calcium: 9 mg/dL (ref 8.9–10.3)
Chloride: 108 mmol/L (ref 98–111)
Creatinine: 0.68 mg/dL (ref 0.61–1.24)
GFR, Estimated: >60 mL/min (ref >60)
Glucose, Bld: 110 mg/dL — ABNORMAL HIGH (ref 70–99)
Potassium: 3.6 mmol/L (ref 3.5–5.1)
Sodium: 142 mmol/L (ref 135–145)
Total Bilirubin: 0.5 mg/dL (ref 0.3–1.2)
Total Protein: 5.9 g/dL — ABNORMAL LOW (ref 6.5–8.1)

## 2020-08-02 LAB — LACTATE DEHYDROGENASE: LDH: 187 U/L (ref 98–192)

## 2020-08-02 LAB — TSH: TSH: 1.173 u[IU]/mL (ref 0.320–4.118)

## 2020-08-02 MED ORDER — SODIUM CHLORIDE 0.9 % IV SOLN
Freq: Once | INTRAVENOUS | Status: AC
  Filled 2020-08-02: qty 250

## 2020-08-02 MED ORDER — PROCHLORPERAZINE MALEATE 10 MG PO TABS
10.0000 mg | ORAL_TABLET | Freq: Once | ORAL | Status: AC
  Administered 2020-08-02: 10 mg via ORAL

## 2020-08-02 MED ORDER — SODIUM CHLORIDE 0.9 % IV SOLN
200.0000 mg | Freq: Once | INTRAVENOUS | Status: AC
  Administered 2020-08-02: 200 mg via INTRAVENOUS
  Filled 2020-08-02: qty 8

## 2020-08-02 MED ORDER — SODIUM CHLORIDE 0.9 % IV SOLN
400.0000 mg/m2 | Freq: Once | INTRAVENOUS | Status: AC
  Administered 2020-08-02: 700 mg via INTRAVENOUS
  Filled 2020-08-02: qty 8

## 2020-08-02 MED ORDER — PALONOSETRON HCL INJECTION 0.25 MG/5ML
0.2500 mg | Freq: Once | INTRAVENOUS | Status: AC
  Administered 2020-08-02: 0.25 mg via INTRAVENOUS

## 2020-08-02 MED ORDER — SODIUM CHLORIDE 0.9 % IV SOLN
10.0000 mg | Freq: Once | INTRAVENOUS | Status: AC
  Administered 2020-08-02: 10 mg via INTRAVENOUS
  Filled 2020-08-02: qty 10

## 2020-08-02 MED ORDER — SODIUM CHLORIDE 0.9 % IV SOLN
150.0000 mg | Freq: Once | INTRAVENOUS | Status: AC
  Administered 2020-08-02: 150 mg via INTRAVENOUS
  Filled 2020-08-02: qty 150

## 2020-08-02 MED ORDER — SODIUM CHLORIDE 0.9 % IV SOLN
417.5000 mg | Freq: Once | INTRAVENOUS | Status: AC
  Administered 2020-08-02: 420 mg via INTRAVENOUS
  Filled 2020-08-02: qty 42

## 2020-08-02 MED ORDER — PALONOSETRON HCL INJECTION 0.25 MG/5ML
INTRAVENOUS | Status: AC
  Filled 2020-08-02: qty 5

## 2020-08-02 MED ORDER — SODIUM CHLORIDE 0.9% FLUSH
10.0000 mL | INTRAVENOUS | Status: DC | PRN
  Administered 2020-08-02: 10 mL
  Filled 2020-08-02: qty 10

## 2020-08-02 MED ORDER — HEPARIN SOD (PORK) LOCK FLUSH 100 UNIT/ML IV SOLN
500.0000 [IU] | Freq: Once | INTRAVENOUS | Status: AC | PRN
Start: 2020-08-02 — End: 2020-08-02
  Administered 2020-08-02: 500 [IU]
  Filled 2020-08-02: qty 5

## 2020-08-02 MED ORDER — PROCHLORPERAZINE MALEATE 10 MG PO TABS
ORAL_TABLET | ORAL | Status: AC
  Filled 2020-08-02: qty 1

## 2020-08-02 NOTE — Patient Instructions (Signed)

## 2020-08-02 NOTE — Progress Notes (Signed)
Dr. Marin Olp would like to continue carboplatin for a total of 6 cycles. Carboplatin and appropriate premeds added to today (cycle 5) and next treatment per his instructions.

## 2020-08-02 NOTE — Patient Instructions (Signed)
[facility] Discharge Instructions for Patients Receiving Chemotherapy  Today you received the following chemotherapy agents Keytruda, Carboplatin, Alimta  To help prevent nausea and vomiting after your treatment, we encourage you to take your nausea medication    If you develop nausea and vomiting that is not controlled by your nausea medication, call the clinic.   BELOW ARE SYMPTOMS THAT SHOULD BE REPORTED IMMEDIATELY:  *FEVER GREATER THAN 100.5 F  *CHILLS WITH OR WITHOUT FEVER  NAUSEA AND VOMITING THAT IS NOT CONTROLLED WITH YOUR NAUSEA MEDICATION  *UNUSUAL SHORTNESS OF BREATH  *UNUSUAL BRUISING OR BLEEDING  TENDERNESS IN MOUTH AND THROAT WITH OR WITHOUT PRESENCE OF ULCERS  *URINARY PROBLEMS  *BOWEL PROBLEMS  UNUSUAL RASH Items with * indicate a potential emergency and should be followed up as soon as possible.  Feel free to call the clinic should you have any questions or concerns. The clinic phone number is [phone].  Please show the [facility] at check-in to the Emergency Department and triage nurse.  Pt discharged in no apparent distress. Pt left ambulatory without assistance. Pt aware of discharge instructions and verbalized understanding and had no further questions.

## 2020-08-02 NOTE — Telephone Encounter (Signed)
Appointments scheduled calendar printed per 12/20 los

## 2020-08-02 NOTE — Progress Notes (Signed)
Ok to treat with ANC 1.4 per Dr Marin Olp

## 2020-08-02 NOTE — Progress Notes (Signed)
Hematology and Oncology Follow Up Visit  Jerome Kelley [id number] [birth year] 79 y.o. 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  Overall, he seems to be doing pretty well.  He had a nice weekend.  He is eating okay.  He is having no problems with cough or shortness of breath.  He is still smoking.  He has had a little bit of constipation.  He is taking some over-the-counter agent for this.  He has had no bleeding.  He has had no headache.  He has had a little bit of swelling with the left ankle.  Overall, I would say his performance status is ECOG 1.    Medications:  Allergies as of 08/02/2020   No Known Allergies     Medication List       Accurate as of August 02, 2020 10:59 AM. If you have any questions, ask your nurse or doctor.        aspirin 81 MG chewable tablet Chew 1 tablet (81 mg total) by mouth 2 (two) times daily.   benazepril 20 MG tablet Commonly known as: LOTENSIN Take 20 mg by mouth daily.   dexamethasone 4 MG tablet Commonly known as: DECADRON Take 1 tab two times a day the day before Alimta chemo, then take 2 tabs once a day for 3 days starting the day after cisplatin.   folic acid 1 MG tablet Commonly known as: FOLVITE Take 1 tablet (1 mg total) by mouth daily. Start 5-7 days before Alimta chemotherapy. Continue until 21 days after Alimta completed.   lidocaine-prilocaine cream Commonly known as: EMLA Apply to affected area once   ondansetron 8 MG tablet Commonly known as: Zofran Take 1 tablet (8 mg total) by mouth 2 (two) times daily as needed (Nausea or vomiting). Start if needed  on the third day after cisplatin.   prochlorperazine 10 MG tablet Commonly known as: COMPAZINE Take 1 tablet (10 mg total) by mouth every 6 (six) hours as needed (Nausea or vomiting).   vitamin B-12 1000 MCG tablet Commonly known as: CYANOCOBALAMIN Take 1 tablet (1,000 mcg total) by mouth daily.       Allergies: No Known Allergies  Past Medical History, Surgical history, Social history, and Family History were reviewed and updated.  Review of Systems: Review of Systems  Constitutional: Negative.   HENT: Negative.   Eyes: Negative.   Respiratory: Negative.   Cardiovascular: Negative.   Gastrointestinal: Negative.   Genitourinary: Negative.   Musculoskeletal: Negative.   Skin: Negative.   Neurological: Negative.   Endo/Heme/Allergies: Negative.   Psychiatric/Behavioral: Negative.      Physical Exam:  weight is 156 lb (70.8 kg). His oral temperature is 97.6 F (36.4 C). His blood pressure is 174/60 (abnormal) and his pulse is 68. His respiration is 17 and oxygen saturation is 100%.   Wt Readings from Last 3 Encounters:  08/02/20 156 lb (70.8 kg)  07/13/20 157 lb (71.2 kg)  06/21/20 155 lb 6.4 oz (70.5 kg)    Physical Exam Vitals reviewed.  HENT:     Head: Normocephalic and atraumatic.  Eyes:     Pupils: Pupils are equal, round, and reactive to light.  Cardiovascular:  Rate and Rhythm: Normal rate and regular rhythm.     Heart sounds: Normal heart sounds.  Pulmonary:     Effort: Pulmonary effort is normal.     Breath sounds: Normal breath sounds.  Abdominal:     General: Bowel sounds are normal.     Palpations: Abdomen is soft.  Musculoskeletal:        General: No tenderness or deformity. Normal range of motion.     Cervical back: Normal range of motion.  Lymphadenopathy:     Cervical: No cervical adenopathy.  Skin:    General: Skin is warm and dry.     Findings: No erythema or rash.  Neurological:     Mental Status: He is alert and oriented to  person, place, and time.  Psychiatric:        Behavior: Behavior normal.        Thought Content: Thought content normal.        Judgment: Judgment normal.      Lab Results  Component Value Date   WBC 2.9 (L) 08/02/2020   HGB 8.3 (L) 08/02/2020   HCT 25.6 (L) 08/02/2020   MCV 104.1 (H) 08/02/2020   PLT 110 (L) 08/02/2020   Lab Results  Component Value Date   FERRITIN 395 (H) 07/13/2020   IRON 71 07/13/2020   TIBC 239 07/13/2020   UIBC 168 07/13/2020   IRONPCTSAT 30 07/13/2020   Lab Results  Component Value Date   RBC 2.46 (L) 08/02/2020   No results found for: KPAFRELGTCHN, LAMBDASER, KAPLAMBRATIO No results found for: IGGSERUM, IGA, IGMSERUM No results found for: Odetta Pink, SPEI   Chemistry      Component Value Date/Time   NA 142 08/02/2020 0939   NA 140 06/28/2011 0950   K 3.6 08/02/2020 0939   K 4.7 06/28/2011 0950   CL 108 08/02/2020 0939   CL 100 06/28/2011 0950   CO2 26 08/02/2020 0939   CO2 29 06/28/2011 0950   BUN 12 08/02/2020 0939   BUN 10 06/28/2011 0950   CREATININE 0.68 08/02/2020 0939   CREATININE 0.7 06/28/2011 0950      Component Value Date/Time   CALCIUM 9.0 08/02/2020 0939   CALCIUM 9.0 06/28/2011 0950   ALKPHOS 103 08/02/2020 0939   ALKPHOS 110 (H) 12/22/2010 0850   AST 11 (L) 08/02/2020 0939   ALT 9 08/02/2020 0939   ALT 18 12/22/2010 0850   BILITOT 0.5 08/02/2020 0939       Impression and Plan: Jerome Kelley is a very pleasant 79 yo caucasian gentleman with previous history of right lung cancer as well as colon cancer 10 years ago.   He now has stage IIIb adenocarcinoma of the lung, right middle lobe.   We will proceed with cycle #5 today as planned.  We will still try to go for 6 cycles of treatment.  After that, we will repeat his PET scan and see if we need to think about radiation therapy.  I realize that his blood counts will be on the lower side.  However, I think that he  seems to be hold on pretty well.  He really has no symptoms.  I will plan to see him back in another month.   [name], MD 12/20/202110:59 AM

## 2020-08-02 NOTE — Progress Notes (Signed)
Oncology Nurse Navigator Documentation  Oncology Nurse Navigator Flowsheets 08/02/2020  Abnormal Finding Date -  Confirmed Diagnosis Date -  Diagnosis Status -  Planned Course of Treatment -  Phase of Treatment -  Chemotherapy Actual Start Date: -  Navigator Follow Up Date: -  Navigator Follow Up Reason: -  Navigation Complete Date: 08/02/2020  Post Navigation: Continue to Follow Patient? No  Reason Not Navigating Patient: Patient On Maintenance Chemotherapy  Navigator Location [facility]  Referral Date to RadOnc/MedOnc -  Navigator Encounter Type Appt/Treatment Plan Review  Telephone -  Treatment Initiated Date -  Patient Visit Type MedOnc  Treatment Phase Active Tx  Barriers/Navigation Needs Coordination of Care;Education  Education -  Interventions None Required  Acuity Level 2-Minimal Needs (1-2 Barriers Identified)  Referrals -  Coordination of Care -  Education Method -  Support Groups/Services Friends and Family  Time Spent with Patient 15

## 2020-08-03 LAB — T4: T4, Total: 6.5 ug/dL (ref 4.5–12.0)

## 2020-08-24 ENCOUNTER — Other Ambulatory Visit: Payer: Medicare Other

## 2020-08-24 ENCOUNTER — Ambulatory Visit: Payer: Medicare Other

## 2020-08-24 ENCOUNTER — Ambulatory Visit: Payer: Medicare Other | Admitting: Hematology & Oncology

## 2020-08-26 ENCOUNTER — Telehealth: Payer: Self-pay

## 2020-08-26 NOTE — Telephone Encounter (Signed)
S/w pr and he is aware of possible delay and or closure on 08/30/20 due to weather     aom

## 2020-08-27 ENCOUNTER — Telehealth: Payer: Self-pay

## 2020-08-27 NOTE — Telephone Encounter (Signed)
Office will not open until 11 on 08/30/20 due to weather, pt wife aware of new appts and to also ck my chart and news for additional cx appt    aom

## 2020-08-30 ENCOUNTER — Other Ambulatory Visit: Payer: Medicare Other

## 2020-08-30 ENCOUNTER — Inpatient Hospital Stay: Payer: Medicare Other | Admitting: Hematology & Oncology

## 2020-08-30 ENCOUNTER — Ambulatory Visit: Payer: Medicare Other

## 2020-08-30 ENCOUNTER — Inpatient Hospital Stay: Payer: Medicare Other

## 2020-08-30 ENCOUNTER — Ambulatory Visit: Payer: Medicare Other | Admitting: Hematology & Oncology

## 2020-08-30 ENCOUNTER — Telehealth: Payer: Self-pay | Admitting: *Deleted

## 2020-08-30 NOTE — Telephone Encounter (Signed)
Called pt regarding today's appt. Pt advised he was told last week not to come due to weather and he would be rescheduled. No further concerns.

## 2020-09-10 ENCOUNTER — Telehealth: Payer: Self-pay

## 2020-09-10 ENCOUNTER — Inpatient Hospital Stay: Payer: Medicare Other

## 2020-09-10 ENCOUNTER — Inpatient Hospital Stay: Payer: Medicare Other | Attending: Hematology & Oncology

## 2020-09-10 ENCOUNTER — Encounter: Payer: Self-pay | Admitting: Hematology & Oncology

## 2020-09-10 ENCOUNTER — Other Ambulatory Visit: Payer: Self-pay

## 2020-09-10 ENCOUNTER — Inpatient Hospital Stay (HOSPITAL_BASED_OUTPATIENT_CLINIC_OR_DEPARTMENT_OTHER): Payer: Medicare Other | Admitting: Hematology & Oncology

## 2020-09-10 VITALS — Wt 158.0 lb

## 2020-09-10 DIAGNOSIS — C3411 Malignant neoplasm of upper lobe, right bronchus or lung: Secondary | ICD-10-CM

## 2020-09-10 DIAGNOSIS — Z79899 Other long term (current) drug therapy: Secondary | ICD-10-CM | POA: Insufficient documentation

## 2020-09-10 DIAGNOSIS — Z5111 Encounter for antineoplastic chemotherapy: Secondary | ICD-10-CM | POA: Diagnosis not present

## 2020-09-10 DIAGNOSIS — C342 Malignant neoplasm of middle lobe, bronchus or lung: Secondary | ICD-10-CM | POA: Insufficient documentation

## 2020-09-10 DIAGNOSIS — Z5112 Encounter for antineoplastic immunotherapy: Secondary | ICD-10-CM | POA: Insufficient documentation

## 2020-09-10 LAB — CBC WITH DIFFERENTIAL (CANCER CENTER ONLY)
Abs Immature Granulocytes: 0.06 10*3/uL (ref 0.00–0.07)
Basophils Absolute: 0 10*3/uL (ref 0.0–0.1)
Basophils Relative: 0 %
Eosinophils Absolute: 0 10*3/uL (ref 0.0–0.5)
Eosinophils Relative: 1 %
HCT: 29.9 % — ABNORMAL LOW (ref 39.0–52.0)
Hemoglobin: 9.8 g/dL — ABNORMAL LOW (ref 13.0–17.0)
Immature Granulocytes: 1 %
Lymphocytes Relative: 28 %
Lymphs Abs: 1.3 10*3/uL (ref 0.7–4.0)
MCH: 34.3 pg — ABNORMAL HIGH (ref 26.0–34.0)
MCHC: 32.8 g/dL (ref 30.0–36.0)
MCV: 104.5 fL — ABNORMAL HIGH (ref 80.0–100.0)
Monocytes Absolute: 0.6 10*3/uL (ref 0.1–1.0)
Monocytes Relative: 13 %
Neutro Abs: 2.6 10*3/uL (ref 1.7–7.7)
Neutrophils Relative %: 57 %
Platelet Count: 95 10*3/uL — ABNORMAL LOW (ref 150–400)
RBC: 2.86 MIL/uL — ABNORMAL LOW (ref 4.22–5.81)
RDW: 15.9 % — ABNORMAL HIGH (ref 11.5–15.5)
WBC Count: 4.5 10*3/uL (ref 4.0–10.5)
nRBC: 0 % (ref 0.0–0.2)

## 2020-09-10 LAB — CMP (CANCER CENTER ONLY)
ALT: 8 U/L (ref 0–44)
AST: 14 U/L — ABNORMAL LOW (ref 15–41)
Albumin: 3.4 g/dL — ABNORMAL LOW (ref 3.5–5.0)
Alkaline Phosphatase: 96 U/L (ref 38–126)
Anion gap: 6 (ref 5–15)
BUN: 15 mg/dL (ref 8–23)
CO2: 27 mmol/L (ref 22–32)
Calcium: 9.1 mg/dL (ref 8.9–10.3)
Chloride: 108 mmol/L (ref 98–111)
Creatinine: 0.74 mg/dL (ref 0.61–1.24)
GFR, Estimated: >60 mL/min (ref >60)
Glucose, Bld: 116 mg/dL — ABNORMAL HIGH (ref 70–99)
Potassium: 3.3 mmol/L — ABNORMAL LOW (ref 3.5–5.1)
Sodium: 141 mmol/L (ref 135–145)
Total Bilirubin: 0.4 mg/dL (ref 0.3–1.2)
Total Protein: 5.6 g/dL — ABNORMAL LOW (ref 6.5–8.1)

## 2020-09-10 LAB — SAMPLE TO BLOOD BANK

## 2020-09-10 LAB — LACTATE DEHYDROGENASE: LDH: 170 U/L (ref 98–192)

## 2020-09-10 LAB — TSH: TSH: 2.26 u[IU]/mL (ref 0.320–4.118)

## 2020-09-10 MED ORDER — SODIUM CHLORIDE 0.9 % IV SOLN
417.5000 mg | Freq: Once | INTRAVENOUS | Status: AC
  Administered 2020-09-10: 420 mg via INTRAVENOUS
  Filled 2020-09-10: qty 42

## 2020-09-10 MED ORDER — SODIUM CHLORIDE 0.9 % IV SOLN
400.0000 mg/m2 | Freq: Once | INTRAVENOUS | Status: AC
  Administered 2020-09-10: 700 mg via INTRAVENOUS
  Filled 2020-09-10: qty 20

## 2020-09-10 MED ORDER — PROCHLORPERAZINE MALEATE 10 MG PO TABS
10.0000 mg | ORAL_TABLET | Freq: Once | ORAL | Status: AC
  Administered 2020-09-10: 10 mg via ORAL

## 2020-09-10 MED ORDER — HEPARIN SOD (PORK) LOCK FLUSH 100 UNIT/ML IV SOLN
500.0000 [IU] | Freq: Once | INTRAVENOUS | Status: AC | PRN
  Administered 2020-09-10: 500 [IU]
  Filled 2020-09-10: qty 5

## 2020-09-10 MED ORDER — SODIUM CHLORIDE 0.9 % IV SOLN
10.0000 mg | Freq: Once | INTRAVENOUS | Status: AC
  Administered 2020-09-10: 10 mg via INTRAVENOUS
  Filled 2020-09-10: qty 10

## 2020-09-10 MED ORDER — SODIUM CHLORIDE 0.9 % IV SOLN
150.0000 mg | Freq: Once | INTRAVENOUS | Status: AC
  Administered 2020-09-10: 150 mg via INTRAVENOUS
  Filled 2020-09-10: qty 150

## 2020-09-10 MED ORDER — PALONOSETRON HCL INJECTION 0.25 MG/5ML
INTRAVENOUS | Status: AC
  Filled 2020-09-10: qty 5

## 2020-09-10 MED ORDER — PALONOSETRON HCL INJECTION 0.25 MG/5ML
0.2500 mg | Freq: Once | INTRAVENOUS | Status: AC
  Administered 2020-09-10: 0.25 mg via INTRAVENOUS

## 2020-09-10 MED ORDER — SODIUM CHLORIDE 0.9 % IV SOLN
Freq: Once | INTRAVENOUS | Status: AC
  Filled 2020-09-10: qty 250

## 2020-09-10 MED ORDER — PEMBROLIZUMAB CHEMO INJECTION 100 MG/4ML
200.0000 mg | Freq: Once | INTRAVENOUS | Status: AC
  Administered 2020-09-10: 200 mg via INTRAVENOUS
  Filled 2020-09-10: qty 8

## 2020-09-10 MED ORDER — SODIUM CHLORIDE 0.9% FLUSH
10.0000 mL | INTRAVENOUS | Status: DC | PRN
  Administered 2020-09-10: 10 mL
  Filled 2020-09-10: qty 10

## 2020-09-10 MED ORDER — PROCHLORPERAZINE MALEATE 10 MG PO TABS
ORAL_TABLET | ORAL | Status: AC
  Filled 2020-09-10: qty 1

## 2020-09-10 NOTE — Progress Notes (Signed)
Hematology and Oncology Follow Up Visit  Jerome Kelley [id number] [birth year] 80 y.o. 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  He did have a very nice Christmas and New Year's holiday.  He was with his family.  He enjoyed his grandchildren.  As always, he is still smoking.  He just will not stop.  He understands the complications and implications of smoking.  He has had no problems with nausea or vomiting.  He has had some constipation.  He takes a laxative.  He has had no pain.  He has had no bleeding.  There is been a little bit of swelling with the left ankle but he wears a brace for this.  This will be his last cycle of full chemotherapy.  After this, we will repeat his PET scan and see how everything looks.  We will then possibly consider radiation therapy.  Upfront, I just do not think that he was a good candidate for combined modality therapy.  Currently, his performance status is ECOG 1-2.    Medications:  Allergies as of 09/10/2020   No Known Allergies     Medication List       Accurate as of September 10, 2020  9:49 AM. If you have any questions, ask your nurse or doctor.        aspirin 81 MG chewable tablet Chew 1 tablet (81 mg total) by mouth 2 (two) times daily.   benazepril 20 MG tablet Commonly known as: LOTENSIN Take 20 mg by mouth daily.   dexamethasone 4 MG tablet Commonly known as: DECADRON Take 1 tab two times a day the day before Alimta chemo, then take 2 tabs once a day for 3 days starting the day after cisplatin.   folic acid 1 MG tablet Commonly known as: FOLVITE Take 1 tablet (1 mg  total) by mouth daily. Start 5-7 days before Alimta chemotherapy. Continue until 21 days after Alimta completed.   lidocaine-prilocaine cream Commonly known as: EMLA Apply to affected area once   ondansetron 8 MG tablet Commonly known as: Zofran Take 1 tablet (8 mg total) by mouth 2 (two) times daily as needed (Nausea or vomiting). Start if needed on the third day after cisplatin.   prochlorperazine 10 MG tablet Commonly known as: COMPAZINE Take 1 tablet (10 mg total) by mouth every 6 (six) hours as needed (Nausea or vomiting).   vitamin B-12 1000 MCG tablet Commonly known as: CYANOCOBALAMIN Take 1 tablet (1,000 mcg total) by mouth daily.       Allergies: No Known Allergies  Past Medical History, Surgical history, Social history, and Family History were reviewed and updated.  Review of Systems: Review of Systems  Constitutional: Negative.   HENT: Negative.   Eyes: Negative.   Respiratory: Negative.   Cardiovascular: Negative.   Gastrointestinal: Negative.   Genitourinary: Negative.   Musculoskeletal: Negative.   Skin: Negative.   Neurological: Negative.   Endo/Heme/Allergies: Negative.   Psychiatric/Behavioral: Negative.      Physical Exam:  weight is 158 lb (71.7 kg).   Wt Readings from Last 3 Encounters:  09/10/20 158 lb (71.7 kg)  09/10/20 160 lb (72.6 kg)  08/02/20 156 lb (70.8 kg)  Physical Exam Vitals reviewed.  HENT:     Head: Normocephalic and atraumatic.  Eyes:     Pupils: Pupils are equal, round, and reactive to light.  Cardiovascular:     Rate and Rhythm: Normal rate and regular rhythm.     Heart sounds: Normal heart sounds.  Pulmonary:     Effort: Pulmonary effort is normal.     Breath sounds: Normal breath sounds.  Abdominal:     General: Bowel sounds are normal.     Palpations: Abdomen is soft.  Musculoskeletal:        General: No tenderness or deformity. Normal range of motion.     Cervical back: Normal range of motion.   Lymphadenopathy:     Cervical: No cervical adenopathy.  Skin:    General: Skin is warm and dry.     Findings: No erythema or rash.  Neurological:     Mental Status: He is alert and oriented to person, place, and time.  Psychiatric:        Behavior: Behavior normal.        Thought Content: Thought content normal.        Judgment: Judgment normal.      Lab Results  Component Value Date   WBC 4.5 09/10/2020   HGB 9.8 (L) 09/10/2020   HCT 29.9 (L) 09/10/2020   MCV 104.5 (H) 09/10/2020   PLT 95 (L) 09/10/2020   Lab Results  Component Value Date   FERRITIN 395 (H) 07/13/2020   IRON 71 07/13/2020   TIBC 239 07/13/2020   UIBC 168 07/13/2020   IRONPCTSAT 30 07/13/2020   Lab Results  Component Value Date   RBC 2.86 (L) 09/10/2020   No results found for: KPAFRELGTCHN, LAMBDASER, KAPLAMBRATIO No results found for: IGGSERUM, IGA, IGMSERUM No results found for: Odetta Pink, SPEI   Chemistry      Component Value Date/Time   NA 141 09/10/2020 0900   NA 140 06/28/2011 0950   K 3.3 (L) 09/10/2020 0900   K 4.7 06/28/2011 0950   CL 108 09/10/2020 0900   CL 100 06/28/2011 0950   CO2 27 09/10/2020 0900   CO2 29 06/28/2011 0950   BUN 15 09/10/2020 0900   BUN 10 06/28/2011 0950   CREATININE 0.74 09/10/2020 0900   CREATININE 0.7 06/28/2011 0950      Component Value Date/Time   CALCIUM 9.1 09/10/2020 0900   CALCIUM 9.0 06/28/2011 0950   ALKPHOS 96 09/10/2020 0900   ALKPHOS 110 (H) 12/22/2010 0850   AST 14 (L) 09/10/2020 0900   ALT 8 09/10/2020 0900   ALT 18 12/22/2010 0850   BILITOT 0.4 09/10/2020 0900       Impression and Plan: Jerome Kelley is a very pleasant 80 yo caucasian gentleman with previous history of right lung cancer as well as colon cancer 10 years ago.   He now has stage IIIb adenocarcinoma of the lung, right middle lobe.   We will proceed with cycle #6 and final cycle of treatment.  Everything so far is  going well.  We will see what his PET scan shows.  We will get this in 1 month.  I will then plan to see him back in 6 weeks and we will then plan for the next phase of therapy.   [name], MD 1/28/20229:49 AM

## 2020-09-10 NOTE — Progress Notes (Signed)
Ok to treat with platelets of 95 per Dr Marin Olp. dph

## 2020-09-10 NOTE — Patient Instructions (Signed)
Jerome Kelley Discharge Instructions for Patients Receiving Chemotherapy  Today you received the following chemotherapy agents Keytruda, Carboplatin, Alimta  To help prevent nausea and vomiting after your treatment, we encourage you to take your nausea medication    If you develop nausea and vomiting that is not controlled by your nausea medication, call the clinic.   BELOW ARE SYMPTOMS THAT SHOULD BE REPORTED IMMEDIATELY:  *FEVER GREATER THAN 100.5 F  *CHILLS WITH OR WITHOUT FEVER  NAUSEA AND VOMITING THAT IS NOT CONTROLLED WITH YOUR NAUSEA MEDICATION  *UNUSUAL SHORTNESS OF BREATH  *UNUSUAL BRUISING OR BLEEDING  TENDERNESS IN MOUTH AND THROAT WITH OR WITHOUT PRESENCE OF ULCERS  *URINARY PROBLEMS  *BOWEL PROBLEMS  UNUSUAL RASH Items with * indicate a potential emergency and should be followed up as soon as possible.  Feel free to call the clinic should you have any questions or concerns. The clinic phone number is [phone].  Please show the [place] at check-in to the Emergency Department and triage nurse.  Pt discharged in no apparent distress. Pt left ambulatory without assistance. Pt aware of discharge instructions and verbalized understanding and had no further questions.

## 2020-09-10 NOTE — Telephone Encounter (Signed)
appts made per 09/10/20 los and pt will rec sch in tx/avs     Jeralyn Nolden

## 2020-09-11 LAB — T4: T4, Total: 8.4 ug/dL (ref 4.5–12.0)

## 2020-10-01 ENCOUNTER — Other Ambulatory Visit: Payer: Self-pay

## 2020-10-01 ENCOUNTER — Ambulatory Visit (HOSPITAL_COMMUNITY)
Admission: RE | Admit: 2020-10-01 | Discharge: 2020-10-01 | Disposition: A | Discharge status: Home / Self Care | Payer: Medicare Other | Source: Ambulatory Visit | Attending: Hematology & Oncology | Admitting: Hematology & Oncology

## 2020-10-01 DIAGNOSIS — C3490 Malignant neoplasm of unspecified part of unspecified bronchus or lung: Secondary | ICD-10-CM | POA: Diagnosis not present

## 2020-10-01 DIAGNOSIS — C3411 Malignant neoplasm of upper lobe, right bronchus or lung: Secondary | ICD-10-CM | POA: Insufficient documentation

## 2020-10-01 LAB — GLUCOSE, CAPILLARY: Glucose-Capillary: 94 mg/dL (ref 70–99)

## 2020-10-01 IMAGING — PT NM PET TUM IMG RESTAG (PS) SKULL BASE T - THIGH
1 of 8 series · 1 of 25 positions shown · non-contrast
Comparison: Nine

CLINICAL DATA: Sub treatment strategy for non-small cell lung
cancer. Status post chemo therapy. Stage IIIB lung cancer

EXAM:
NUCLEAR MEDICINE PET SKULL BASE TO THIGH
TECHNIQUE: 7.8 mCi F-18 FDG was injected intravenously. Full-ring PET imaging
was performed from the skull base to thigh after the radiotracer. CT
data was obtained and used for attenuation correction and anatomic
localization.
Fasting blood glucose: 94 mg/dl

[Series 4: ct sk_thigh 5.0 bf37 · axial · 5.0mm · 0.98mm/px · 1 of 240 slices shown]
[im 240/240  brain]
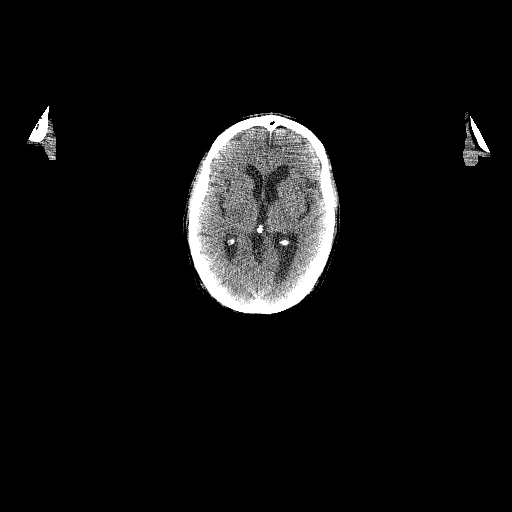

[1 of 25 positions shown; findings below may reference images not displayed]

FINDINGS: Mediastinal blood pool activity: SUV max

Liver activity: SUV max NA

NECK: No hypermetabolic lymph nodes in the neck.

Incidental CT findings: none

CHEST: Hypermetabolic RIGHT paratracheal and hilar nodes are similar
to decreased from prior. For example RIGHT lower paratracheal node
which is minimally enlarged (10 mm) has SUV max equal 7.9 which
compared SUV max equal 17.7. Small RIGHT hilar node with SUV max
equal 6.1 compared SUV max equal 5.5. No new adenopathy.

Additionally there are calcified nodes in the mediastinum.

No hypermetabolic pulmonary nodules. There is bibasilar pleural
thickening. Calcified pleural plaques at the LEFT and RIGHT lung
base.

Incidental CT findings: none

ABDOMEN/PELVIS: No abnormal hypermetabolic activity within the
liver, pancreas, adrenal glands, or spleen. No hypermetabolic lymph
nodes in the abdomen or pelvis.

Hypermetabolic nodule midline along the SANON superficial to
the peritoneal space which measures 9 mm with SUV max equal 4.4.
This compared SUV max equal 6.6 on comparison PET-CT scan from
[DATE]. Nodule similar in size.

Incidental CT findings: Extensive LEFT colon diverticulosis. Partial
RIGHT hemicolectomy.

SKELETON: No focal hypermetabolic activity to suggest skeletal
metastasis.

Incidental CT findings: none
IMPRESSION: 1. Persistent intensely hypermetabolic small mediastinal lymph nodes
. Multiple calcified nodules in the mediastinum. Findings may
represent residual hypermetabolic granulomatous disease.
2. No suspicious pulmonary nodules.
3. Calcified pleural plaques at the lung bases.
4. Small hypermetabolic nodule along the ventral abdominal wall
superficial to the fascial layer. Recommend attention on follow-up.

## 2020-10-01 MED ORDER — FLUDEOXYGLUCOSE F - 18 (FDG) INJECTION
7.5000 | Freq: Once | INTRAVENOUS | Status: AC
  Administered 2020-10-01: 7.83 via INTRAVENOUS

## 2020-10-20 ENCOUNTER — Other Ambulatory Visit: Payer: Self-pay

## 2020-10-20 ENCOUNTER — Ambulatory Visit
Admission: RE | Admit: 2020-10-20 | Discharge: 2020-10-20 | Disposition: A | Discharge status: Home / Self Care | Payer: Medicare Other | Source: Ambulatory Visit | Attending: Radiation Oncology | Admitting: Radiation Oncology

## 2020-10-20 ENCOUNTER — Encounter: Payer: Self-pay | Admitting: Radiation Oncology

## 2020-10-20 VITALS — BP 142/60 | HR 67 | Resp 18 | Wt 161.5 lb

## 2020-10-20 DIAGNOSIS — F17210 Nicotine dependence, cigarettes, uncomplicated: Secondary | ICD-10-CM | POA: Insufficient documentation

## 2020-10-20 DIAGNOSIS — Z85038 Personal history of other malignant neoplasm of large intestine: Secondary | ICD-10-CM | POA: Diagnosis not present

## 2020-10-20 DIAGNOSIS — Z9221 Personal history of antineoplastic chemotherapy: Secondary | ICD-10-CM | POA: Diagnosis not present

## 2020-10-20 DIAGNOSIS — I10 Essential (primary) hypertension: Secondary | ICD-10-CM | POA: Insufficient documentation

## 2020-10-20 DIAGNOSIS — C771 Secondary and unspecified malignant neoplasm of intrathoracic lymph nodes: Secondary | ICD-10-CM | POA: Diagnosis not present

## 2020-10-20 DIAGNOSIS — R599 Enlarged lymph nodes, unspecified: Secondary | ICD-10-CM | POA: Diagnosis not present

## 2020-10-20 DIAGNOSIS — Z79899 Other long term (current) drug therapy: Secondary | ICD-10-CM | POA: Insufficient documentation

## 2020-10-20 DIAGNOSIS — C3411 Malignant neoplasm of upper lobe, right bronchus or lung: Secondary | ICD-10-CM

## 2020-10-20 DIAGNOSIS — C342 Malignant neoplasm of middle lobe, bronchus or lung: Secondary | ICD-10-CM | POA: Insufficient documentation

## 2020-10-20 DIAGNOSIS — K5730 Diverticulosis of large intestine without perforation or abscess without bleeding: Secondary | ICD-10-CM | POA: Diagnosis not present

## 2020-10-20 DIAGNOSIS — Z7982 Long term (current) use of aspirin: Secondary | ICD-10-CM | POA: Diagnosis not present

## 2020-10-20 DIAGNOSIS — I6782 Cerebral ischemia: Secondary | ICD-10-CM | POA: Diagnosis not present

## 2020-10-20 NOTE — Progress Notes (Signed)
Thoracic Location of Tumor / Histology Right Lung  Biopsies of 04/02/2020   CYTOLOGY - NON PAP  CASE: [id number]  PATIENT: Jerome Kelley  Non-Gynecological Cytology Report      Clinical History: Lung mass  Specimen Submitted: A. LYMPH NODE, SUBCARINAL, FINE NEEDLE ASPIRATION:    FINAL MICROSCOPIC DIAGNOSIS:  - Malignant cells consistent with metastatic adenocarcinoma  - See comment   SPECIMEN ADEQUACY:  Satisfactory for evaluation   IMMEDIATE EVALUATION:  ADEQUATE (JDP)   DIAGNOSTIC COMMENTS:  Immunohistochemical stains show that the tumor cells are positive for  TTF-1, with focal staining for CK5/6 and negative for p40. This  immunohistochemical profile is consistent with a primary lung  adenocarcinoma.    Tobacco/Marijuana/Snuff/ETOH use: States that he smokes 5 cigarettes a day  Past/Anticipated interventions by cardiothoracic surgery, if any:    04/02/2020 Dr.Olalere, Adewale A, MD  VIDEO BRONCHOSCOPY WITH EBUS N/A General  FINE NEEDLE ASPIRATION BIOPSY     Past/Anticipated interventions by medical oncology, if any:  Current Therapy: Carboplatin/Alimta/Pembrolizumab -started09/27/2021- s/p cycle #5  Signs/Symptoms  Weight changes, if any: none  Respiratory complaints, if any: with activity  Hemoptysis, if any no  Pain issues, if any:  none  SAFETY ISSUES:  Prior radiation? no  Pacemaker/ICD? no  Possible current pregnancy? No   Is the patient on methotrexate?  No Vitals:   10/20/20 1441  BP: (!) 142/60  Pulse: 67  Resp: 18  SpO2: 99%  Weight: 73.3 kg     Current Complaints / other details:  history of right lung cancer as well as colon cancer 10 years ago

## 2020-10-20 NOTE — Progress Notes (Signed)
Radiation Oncology         ([phone]) [phone] ________________________________  Initial Outpatient Consultation  Name: Jerome Kelley MRN: [id number]  Date: 10/20/2020  DOB: 1941/06/03  SE:GBTDVVO, Margaretha Sheffield, MD  [name], MD   REFERRING PHYSICIAN: [name], MD  DIAGNOSIS: The encounter diagnosis was Malignant neoplasm of upper lobe of right lung [facility]).  Stage IIIB (T1a, N3, M0) adenocarcinoma of the right middle lobe  HISTORY OF PRESENT ILLNESS::Jerome Kelley is a 80 y.o. male who is seen as a courtesy of Dr. Marin Olp for an opinion concerning radiation therapy as part of management for his recently diagnosed lung cancer. Today, he is accompanied by his wife. The patient has a history of stage Ia (T1a, N0, M0) adenocarcinoma of the right lung that was resected in March of 2011. He also has a history of stage II adenocarcinoma of the colon in April of 2010.  In more recent history, the patient underwent a chest CT scan on 02/11/2020 that showed interval increase in the size of a right upper lobe pulmonary nodule that measured 8 x 5 mm. Post-surgical changes from partial right lower lobectomy were stable. There was also noted to be mediastinal lymphadenopathy.  PET scan on 02/23/2020 showed a hypermetabolic 0.9 cm solid right middle lobe pulmonary nodule that was compatible with malignancy, presumably a metachronous primary bronchogenic carcinoma. There was also noted to be an intensely hypermetabolic right hilar, subcarinal, right paratracheal, and right supraclavicular adenopathy that was compatible with metastatic disease. There was no hypermetabolic metastatic disease in the abdomen, pelvis, or skeleton.  The patient underwent a bronchoscopy on 03/19/2020 under the care of Dr. Lake Bells. Cytology from the procedure revealed malignant cells consistent with metastatic non-small cell carcinoma.  The patient underwent a flexible bronchoscopy and EBUS bronchoscopy on 04/02/2020 under the care  of Dr. Ander Slade. Cytology from the procedure revealed malignant cells consistent with metastatic adenocarcinoma of a subcarinal lymph node.  MRI of brain on 05/06/2020 showed mild generalized atrophy of the brain and chronic small vessel ischemic changes without evidence of intracranial metastatic disease.  The patient was not felt to be good candidate for combined modality therapy in light of his performance status and age.  The patient completed six cycles of chemotherapy with Carboplatin, Alimta, and Pembrolizumab from 05/10/2020 to 09/10/2020 while under the care of Dr. Marin Olp.   PET scan on 07/05/2020 showed findings suggestive of a good response to treatment. The right middle lobe pulmonary nodule was much smaller and no longer hypermetabolic. However, there were persistent hypermetabolic mediastinal and hilar lymph nodes, but they were smaller and less hypermetabolic when compared to prior study. There were no findings of abdominal/pelvic metastatic disease or osseous metastatic disease.  PET scan on 10/01/2020 showed persistent intensely hypermetabolic small mediastinal lymph nodes. There were also noted to be multiple calcified nodules in the mediastinum, which may represent residual hypermetabolic granulomatous disease. There were no suspicious pulmonary nodules. However, there were calcified pleural plaques at the lung bases and a small hypermetabolic nodule along the ventral abdominal wall that was superficial to the fascial layer.  In light of the persistent activity noted on most recent PET scan in the areas noted above, the patient is now referred to radiation therapy for consideration for single modality treatment of his residual disease.  PREVIOUS RADIATION THERAPY: No  PAST MEDICAL HISTORY:  Past Medical History:  Diagnosis Date  . Colon cancer ([place]) 09/18/2011  . Goals of care, counseling/discussion 03/15/2020  . History of  chemotherapy   . Hypertension   . Lung cancer, upper  lobe ([place]) 09/18/2011  . Malignant neoplasm of upper lobe of right lung ([place]) 04/30/2020    PAST SURGICAL HISTORY: Past Surgical History:  Procedure Laterality Date  . BRONCHIAL WASHINGS  03/19/2020   Procedure: BRONCHIAL WASHINGS;  Surgeon: Juanito Doom, MD;  Location: [facility];  Service: Cardiopulmonary;;  . COLON SURGERY  2010  . ENDOBRONCHIAL ULTRASOUND N/A 03/19/2020   Procedure: ENDOBRONCHIAL ULTRASOUND;  Surgeon: Juanito Doom, MD;  Location: [facility];  Service: Cardiopulmonary;  Laterality: N/A;  . FINE NEEDLE ASPIRATION  03/19/2020   Procedure: FINE NEEDLE ASPIRATION;  Surgeon: Juanito Doom, MD;  Location: [facility];  Service: Cardiopulmonary;;  . FINE NEEDLE ASPIRATION BIOPSY  04/02/2020   Procedure: FINE NEEDLE ASPIRATION BIOPSY;  Surgeon: Laurin Coder, MD;  Location: [facility];  Service: Pulmonary;;  . IR IMAGING GUIDED PORT INSERTION  05/07/2020  . LUNG SURGERY     right side   . port a cath insertion    . port a cath removed    . TOTAL HIP ARTHROPLASTY Right 10/18/2018   Procedure: TOTAL HIP ARTHROPLASTY ANTERIOR APPROACH;  Surgeon: Mcarthur Rossetti, MD;  Location: [facility];  Service: Orthopedics;  Laterality: Right;  Marland Kitchen VIDEO BRONCHOSCOPY N/A 03/19/2020   Procedure: VIDEO BRONCHOSCOPY WITHOUT FLUORO;  Surgeon: Juanito Doom, MD;  Location: [facility];  Service: Cardiopulmonary;  Laterality: N/A;  . VIDEO BRONCHOSCOPY N/A 04/02/2020   Procedure: VIDEO BRONCHOSCOPY WITH EBUS;  Surgeon: Laurin Coder, MD;  Location: [facility];  Service: Pulmonary;  Laterality: N/A;    FAMILY HISTORY: No family history on file.  SOCIAL HISTORY:  Social History   Tobacco Use  . Smoking status: Current Every Day Smoker    Packs/day: 1.00    Years: 58.00    Pack years: 58.00    Types: Cigarettes    Start date: 09/03/1956  . Smokeless tobacco: Never Used  . Tobacco comment: does not want to quit  Vaping Use  . Vaping Use: Never used  Substance  Use Topics  . Alcohol use: Yes    Comment: occas beer  . Drug use: Never    ALLERGIES: No Known Allergies  MEDICATIONS:  Current Outpatient Medications  Medication Sig Dispense Refill  . aspirin 81 MG chewable tablet Chew 1 tablet (81 mg total) by mouth 2 (two) times daily. 35 tablet 0  . benazepril (LOTENSIN) 20 MG tablet Take 20 mg by mouth daily.    . folic acid (FOLVITE) 1 MG tablet Take 1 tablet (1 mg total) by mouth daily. Start 5-7 days before Alimta chemotherapy. Continue until 21 days after Alimta completed. 100 tablet 3  . lidocaine-prilocaine (EMLA) cream Apply to affected area once 30 g 3  . vitamin B-12 (CYANOCOBALAMIN) 1000 MCG tablet Take 1 tablet (1,000 mcg total) by mouth daily. 30 tablet 0  . dexamethasone (DECADRON) 4 MG tablet Take 1 tab two times a day the day before Alimta chemo, then take 2 tabs once a day for 3 days starting the day after cisplatin. (Patient not taking: Reported on 10/20/2020) 30 tablet 1  . ondansetron (ZOFRAN) 8 MG tablet Take 1 tablet (8 mg total) by mouth 2 (two) times daily as needed (Nausea or vomiting). Start if needed on the third day after cisplatin. (Patient not taking: No sig reported) 30 tablet 1  . prochlorperazine (COMPAZINE) 10 MG tablet Take 1 tablet (10 mg total) by mouth every 6 (six)  hours as needed (Nausea or vomiting). (Patient not taking: No sig reported) 30 tablet 1   No current facility-administered medications for this encounter.    REVIEW OF SYSTEMS:  A 10+ POINT REVIEW OF SYSTEMS WAS OBTAINED including neurology, dermatology, psychiatry, cardiac, respiratory, lymph, extremities, GI, GU, musculoskeletal, constitutional, reproductive, HEENT.  He denies any headaches or visual difficulties.  He denies any pain within the chest area significant cough or hemoptysis.  He denies any breathing difficulties.   PHYSICAL EXAM:  weight is 161 lb 8 oz (73.3 kg). His blood pressure is 142/60 (abnormal) and his pulse is 67. His respiration  is 18 and oxygen saturation is 99%.   General: Alert and oriented, in no acute distress HEENT: Head is normocephalic. Extraocular movements are intact. Oropharynx is clear.  Dentures in place Neck: Neck is supple, no palpable cervical or supraclavicular lymphadenopathy. Heart: Regular in rate and rhythm with no murmurs, rubs, or gallops. Chest: Clear to auscultation bilaterally, with no rhonchi, wheezes, or rales. Abdomen: Soft, nontender, nondistended, with no rigidity or guarding. Extremities: No cyanosis or edema. Lymphatics: see Neck Exam Skin: No concerning lesions. Musculoskeletal: symmetric strength and muscle tone throughout. Neurologic: Cranial nerves II through XII are grossly intact. No obvious focalities. Speech is fluent. Coordination is intact. Psychiatric: Judgment and insight are intact. Affect is appropriate.   ECOG = 1  0 - Asymptomatic (Fully active, able to carry on all predisease activities without restriction)  1 - Symptomatic but completely ambulatory (Restricted in physically strenuous activity but ambulatory and able to carry out work of a light or sedentary nature. For example, light housework, office work)  2 - Symptomatic, <50% in bed during the day (Ambulatory and capable of all self care but unable to carry out any work activities. Up and about more than 50% of waking hours)  3 - Symptomatic, >50% in bed, but not bedbound (Capable of only limited self-care, confined to bed or chair 50% or more of waking hours)  4 - Bedbound (Completely disabled. Cannot carry on any self-care. Totally confined to bed or chair)  5 - Death   Eustace Pen MM, Creech RH, Tormey DC, et al. [phone]). "Toxicity and response criteria of the [employer]". [place] Oncol. 5 (6): 649-55  LABORATORY DATA:  Lab Results  Component Value Date   WBC 4.5 09/10/2020   HGB 9.8 (L) 09/10/2020   HCT 29.9 (L) 09/10/2020   MCV 104.5 (H) 09/10/2020   PLT 95 (L) 09/10/2020    NEUTROABS 2.6 09/10/2020   Lab Results  Component Value Date   NA 141 09/10/2020   K 3.3 (L) 09/10/2020   CL 108 09/10/2020   CO2 27 09/10/2020   GLUCOSE 116 (H) 09/10/2020   CREATININE 0.74 09/10/2020   CALCIUM 9.1 09/10/2020      RADIOGRAPHY: NM PET Image Restag (PS) Skull Base To Thigh  Result Date: 10/01/2020 CLINICAL DATA:  Sub treatment strategy for non-small cell lung cancer. Status post chemo therapy. Stage IIIB lung cancer EXAM: NUCLEAR MEDICINE PET SKULL BASE TO THIGH TECHNIQUE: 7.8 mCi F-18 FDG was injected intravenously. Full-ring PET imaging was performed from the skull base to thigh after the radiotracer. CT data was obtained and used for attenuation correction and anatomic localization. Fasting blood glucose: 94 mg/dl COMPARISON:  Nine FINDINGS: Mediastinal blood pool activity: SUV max 2.48 Liver activity: SUV max NA NECK: No hypermetabolic lymph nodes in the neck. Incidental CT findings: none CHEST: Hypermetabolic RIGHT paratracheal and hilar nodes are  similar to decreased from prior. For example RIGHT lower paratracheal node which is minimally enlarged (10 mm) has SUV max equal 7.9 which compared SUV max equal 17.7. Small RIGHT hilar node with SUV max equal 6.1 compared SUV max equal 5.5. No new adenopathy. Additionally there are calcified nodes in the mediastinum. No hypermetabolic pulmonary nodules. There is bibasilar pleural thickening. Calcified pleural plaques at the LEFT and RIGHT lung base. Incidental CT findings: none ABDOMEN/PELVIS: No abnormal hypermetabolic activity within the liver, pancreas, adrenal glands, or spleen. No hypermetabolic lymph nodes in the abdomen or pelvis. Hypermetabolic nodule midline along the linea alba superficial to the peritoneal space which measures 9 mm with SUV max equal 4.4. This compared SUV max equal 6.6 on comparison PET-CT scan from 07/05/2020. Nodule similar in size. Incidental CT findings: Extensive LEFT colon diverticulosis. Partial  RIGHT hemicolectomy. SKELETON: No focal hypermetabolic activity to suggest skeletal metastasis. Incidental CT findings: none IMPRESSION: 1. Persistent intensely hypermetabolic small mediastinal lymph nodes . Multiple calcified nodules in the mediastinum. Findings may represent residual hypermetabolic granulomatous disease. 2. No suspicious pulmonary nodules. 3. Calcified pleural plaques at the lung bases. 4. Small hypermetabolic nodule along the ventral abdominal wall superficial to the fascial layer. Recommend attention on follow-up. Electronically Signed   By: Suzy Bouchard M.D.   On: 10/01/2020 14:42      IMPRESSION: Stage IIIB (T1a, N3, M0) adenocarcinoma of the right middle lobe  As above the patient continues to have persistent activity on his PET scan despite aggressive chemo/immunotherapy.  Patient would be a good candidate for radiation as consolidation through these areas.  Today, I talked to the patient and his wife about the findings and work-up thus far.  We discussed the natural history of lung cancer and general treatment, highlighting the role of radiotherapy in the management.  We discussed the available radiation techniques, and focused on the details of logistics and delivery.  We reviewed the anticipated acute and late sequelae associated with radiation in this setting.  The patient was encouraged to ask questions that I answered to the best of my ability.  A patient consent form was discussed and signed.  We retained a copy for our records.  The patient would like to proceed with radiation and will be scheduled for CT simulation.  PLAN: The patient will proceed with CT simulation next Wednesday, March 16 unless an opening occurs before then.  anticipate radiation therapy starting in approximately 2 weeks.  Anticipate 30 treatments to the mediastinal and right hilar region.  Total time spent in this encounter was 60 minutes which included reviewing the patient's most recent chest CT  scan, PET scans, bronchoscopies, cytology reports, follow-ups, chemotherapy, physical examination, and documentation.   ------------------------------------------------  Blair Promise, PhD, MD  This document serves as a record of services personally performed by Gery Pray, MD. It was created on his behalf by Clerance Lav, a trained medical scribe. The creation of this record is based on the scribe's personal observations and the provider's statements to them. This document has been checked and approved by the attending provider.

## 2020-10-25 ENCOUNTER — Inpatient Hospital Stay: Payer: Medicare Other | Attending: Hematology & Oncology

## 2020-10-25 ENCOUNTER — Other Ambulatory Visit: Payer: Self-pay

## 2020-10-25 ENCOUNTER — Inpatient Hospital Stay: Payer: Medicare Other

## 2020-10-25 ENCOUNTER — Encounter: Payer: Self-pay | Admitting: Hematology & Oncology

## 2020-10-25 ENCOUNTER — Inpatient Hospital Stay (HOSPITAL_BASED_OUTPATIENT_CLINIC_OR_DEPARTMENT_OTHER): Payer: Medicare Other | Admitting: Hematology & Oncology

## 2020-10-25 ENCOUNTER — Telehealth: Payer: Self-pay

## 2020-10-25 VITALS — BP 150/59 | HR 71 | Temp 97.6°F | Resp 18

## 2020-10-25 VITALS — Wt 160.1 lb

## 2020-10-25 DIAGNOSIS — Z95828 Presence of other vascular implants and grafts: Secondary | ICD-10-CM

## 2020-10-25 DIAGNOSIS — F17290 Nicotine dependence, other tobacco product, uncomplicated: Secondary | ICD-10-CM | POA: Diagnosis not present

## 2020-10-25 DIAGNOSIS — Z5111 Encounter for antineoplastic chemotherapy: Secondary | ICD-10-CM | POA: Diagnosis not present

## 2020-10-25 DIAGNOSIS — C342 Malignant neoplasm of middle lobe, bronchus or lung: Secondary | ICD-10-CM | POA: Diagnosis not present

## 2020-10-25 DIAGNOSIS — Z7982 Long term (current) use of aspirin: Secondary | ICD-10-CM | POA: Insufficient documentation

## 2020-10-25 DIAGNOSIS — C3411 Malignant neoplasm of upper lobe, right bronchus or lung: Secondary | ICD-10-CM

## 2020-10-25 DIAGNOSIS — Z85038 Personal history of other malignant neoplasm of large intestine: Secondary | ICD-10-CM | POA: Insufficient documentation

## 2020-10-25 DIAGNOSIS — Z510 Encounter for antineoplastic radiation therapy: Secondary | ICD-10-CM | POA: Diagnosis not present

## 2020-10-25 DIAGNOSIS — Z79899 Other long term (current) drug therapy: Secondary | ICD-10-CM | POA: Insufficient documentation

## 2020-10-25 LAB — CMP (CANCER CENTER ONLY)
ALT: 6 U/L (ref 0–44)
AST: 11 U/L — ABNORMAL LOW (ref 15–41)
Albumin: 3.7 g/dL (ref 3.5–5.0)
Alkaline Phosphatase: 109 U/L (ref 38–126)
Anion gap: 7 (ref 5–15)
BUN: 15 mg/dL (ref 8–23)
CO2: 27 mmol/L (ref 22–32)
Calcium: 9.2 mg/dL (ref 8.9–10.3)
Chloride: 107 mmol/L (ref 98–111)
Creatinine: 0.77 mg/dL (ref 0.61–1.24)
GFR, Estimated: >60 mL/min (ref >60)
Glucose, Bld: 163 mg/dL — ABNORMAL HIGH (ref 70–99)
Potassium: 3.5 mmol/L (ref 3.5–5.1)
Sodium: 141 mmol/L (ref 135–145)
Total Bilirubin: 0.4 mg/dL (ref 0.3–1.2)
Total Protein: 5.8 g/dL — ABNORMAL LOW (ref 6.5–8.1)

## 2020-10-25 LAB — CBC WITH DIFFERENTIAL (CANCER CENTER ONLY)
Abs Immature Granulocytes: 0.03 10*3/uL (ref 0.00–0.07)
Basophils Absolute: 0 10*3/uL (ref 0.0–0.1)
Basophils Relative: 0 %
Eosinophils Absolute: 0 10*3/uL (ref 0.0–0.5)
Eosinophils Relative: 0 %
HCT: 31.7 % — ABNORMAL LOW (ref 39.0–52.0)
Hemoglobin: 10.8 g/dL — ABNORMAL LOW (ref 13.0–17.0)
Immature Granulocytes: 1 %
Lymphocytes Relative: 12 %
Lymphs Abs: 0.8 10*3/uL (ref 0.7–4.0)
MCH: 34.1 pg — ABNORMAL HIGH (ref 26.0–34.0)
MCHC: 34.1 g/dL (ref 30.0–36.0)
MCV: 100 fL (ref 80.0–100.0)
Monocytes Absolute: 0.5 10*3/uL (ref 0.1–1.0)
Monocytes Relative: 8 %
Neutro Abs: 5 10*3/uL (ref 1.7–7.7)
Neutrophils Relative %: 79 %
Platelet Count: 86 10*3/uL — ABNORMAL LOW (ref 150–400)
RBC: 3.17 MIL/uL — ABNORMAL LOW (ref 4.22–5.81)
RDW: 14.6 % (ref 11.5–15.5)
WBC Count: 6.3 10*3/uL (ref 4.0–10.5)
nRBC: 0 % (ref 0.0–0.2)

## 2020-10-25 LAB — LACTATE DEHYDROGENASE: LDH: 168 U/L (ref 98–192)

## 2020-10-25 LAB — TSH: TSH: 0.718 u[IU]/mL (ref 0.320–4.118)

## 2020-10-25 MED ORDER — HEPARIN SOD (PORK) LOCK FLUSH 100 UNIT/ML IV SOLN
500.0000 [IU] | Freq: Once | INTRAVENOUS | Status: AC
  Administered 2020-10-25: 500 [IU] via INTRAVENOUS
  Filled 2020-10-25: qty 5

## 2020-10-25 MED ORDER — SODIUM CHLORIDE 0.9% FLUSH
10.0000 mL | Freq: Once | INTRAVENOUS | Status: AC
  Administered 2020-10-25: 10 mL via INTRAVENOUS
  Filled 2020-10-25: qty 10

## 2020-10-25 NOTE — Progress Notes (Signed)
Pharmacist Chemotherapy Monitoring - Initial Assessment    Anticipated start date: 11/01/20        Regimen:        . Are orders appropriate based on the patient's diagnosis, regimen, and cycle? Yes . Does the plan date match the patient's scheduled date? Yes . Is the sequencing of drugs appropriate? Yes . Are the premedications appropriate for the patient's regimen? Yes . Prior Authorization for treatment is: Not Started o If applicable, is the correct biosimilar selected based on the patient's insurance? not applicable  Organ Function and Labs: Marland Kitchen Are dose adjustments needed based on the patient's renal function, hepatic function, or hematologic function? No . Are appropriate labs ordered prior to the start of patient's treatment? Yes . Other organ system assessment, if indicated: N/A . The following baseline labs, if indicated, have been ordered: N/A  Dose Assessment: . Are the drug doses appropriate? Yes . Are the following correct: o Drug concentrations Yes o IV fluid compatible with drug Yes o Administration routes Yes o Timing of therapy Yes . If applicable, does the patient have documented access for treatment and/or plans for port-a-cath placement? yes . If applicable, have lifetime cumulative doses been properly documented and assessed? yes Lifetime Dose Tracking  . Carboplatin: 2,520 mg = 0.01 % of the maximum lifetime dose of 999,999,999 mg  o   Toxicity Monitoring/Prevention: . The patient has the following take home antiemetics prescribed: Ondansetron, Prochlorperazine, Dexamethasone and Lorazepam . The patient has the following take home medications prescribed: N/A . Medication allergies and previous infusion related reactions, if applicable, have been reviewed and addressed. Yes . The patient's current medication list has been assessed for drug-drug interactions with their chemotherapy regimen. no significant drug-drug interactions were identified on review.  Order  Review: . Are the treatment plan orders signed? Yes . Is the patient scheduled to see a provider prior to their treatment? No  I verify that I have reviewed each item in the above checklist and answered each question accordingly.  Claybon Jabs, [facility], 10/25/2020  10:28 AM

## 2020-10-25 NOTE — Progress Notes (Signed)
Hematology and Oncology Follow Up Visit  Jerome Kelley [id number] Aug 19, 1940 80 y.o. 10/25/2020   Principle Diagnosis:  Stage IIIB (T1aN3M0)adenocarcinoma of the right middle lung History of stage Ia(T1aN0M0)adenocarcinoma the right lung - resected in March 2011 History of stage II adenocarcinoma of the colon-high risk - April 2010  Current Therapy: Carboplatin/Alimta/Pembrolizumab - started 05/10/2020- s/p cycle #6 Radiation with weekly carboplatin/Taxol-start on 11/01/2020   Interim History:  Jerome Kelley is here today for follow-up.  We did go ahead and do a PET scan on him.  This was done on 10/01/2020.  He had a very nice response with his lung nodules.  However, he had activity in the mediastinum.  It is hard to say if this is granulomatous disease or if this is metastatic disease.  We will this would be very difficult to biopsy.  We will go ahead and treat this.  He is done so well with treatment I feel that we could be aggressive and treated the mediastinal nodes with radiation and low-dose chemotherapy.  He is agreeable to this.  He is still smoking.  He is try to cut back on smoking.  He is looking forward to mowing his yard.  He has a riding lumbar that he needs to get fixed or tuned up.  He has had no chest pain.  He has had no cough.  There is been no bleeding.  He has had no change in bowel or bladder habits.  There has been no issues with leg swelling.  He has had no neuropathy.  He has had no rashes.  Overall, his performance status is ECOG 1.   Medications:  Allergies as of 10/25/2020   No Known Allergies     Medication List       Accurate as of October 25, 2020  9:16 AM. If you have any questions, ask your nurse or doctor.        STOP taking these medications   dexamethasone 4 MG tablet Commonly known as: DECADRON Stopped by: [name], MD   ondansetron 8 MG tablet Commonly known as: Zofran Stopped by: [name], MD   prochlorperazine  10 MG tablet Commonly known as: COMPAZINE Stopped by: [name], MD     TAKE these medications   aspirin 81 MG chewable tablet Chew 1 tablet (81 mg total) by mouth 2 (two) times daily. What changed: when to take this   benazepril 20 MG tablet Commonly known as: LOTENSIN Take 20 mg by mouth daily.   folic acid 1 MG tablet Commonly known as: FOLVITE Take 1 tablet (1 mg total) by mouth daily. Start 5-7 days before Alimta chemotherapy. Continue until 21 days after Alimta completed.   lidocaine-prilocaine cream Commonly known as: EMLA Apply to affected area once   vitamin B-12 1000 MCG tablet Commonly known as: CYANOCOBALAMIN Take 1 tablet (1,000 mcg total) by mouth daily.       Allergies: No Known Allergies  Past Medical History, Surgical history, Social history, and Family History were reviewed and updated.  Review of Systems: Review of Systems  Constitutional: Negative.   HENT: Negative.   Eyes: Negative.   Respiratory: Negative.   Cardiovascular: Negative.   Gastrointestinal: Negative.   Genitourinary: Negative.   Musculoskeletal: Negative.   Skin: Negative.   Neurological: Negative.   Endo/Heme/Allergies: Negative.   Psychiatric/Behavioral: Negative.      Physical Exam:  weight is 160 lb 1.9 oz (72.6 kg).   Wt Readings from Last 3  Encounters:  10/25/20 160 lb 1.9 oz (72.6 kg)  10/20/20 161 lb 8 oz (73.3 kg)  09/10/20 158 lb (71.7 kg)    Physical Exam Vitals reviewed.  HENT:     Head: Normocephalic and atraumatic.  Eyes:     Pupils: Pupils are equal, round, and reactive to light.  Cardiovascular:     Rate and Rhythm: Normal rate and regular rhythm.     Heart sounds: Normal heart sounds.  Pulmonary:     Effort: Pulmonary effort is normal.     Breath sounds: Normal breath sounds.  Abdominal:     General: Bowel sounds are normal.     Palpations: Abdomen is soft.  Musculoskeletal:        General: No tenderness or deformity. Normal range of  motion.     Cervical back: Normal range of motion.  Lymphadenopathy:     Cervical: No cervical adenopathy.  Skin:    General: Skin is warm and dry.     Findings: No erythema or rash.  Neurological:     Mental Status: He is alert and oriented to person, place, and time.  Psychiatric:        Behavior: Behavior normal.        Thought Content: Thought content normal.        Judgment: Judgment normal.      Lab Results  Component Value Date   WBC 6.3 10/25/2020   HGB 10.8 (L) 10/25/2020   HCT 31.7 (L) 10/25/2020   MCV 100.0 10/25/2020   PLT 86 (L) 10/25/2020   Lab Results  Component Value Date   FERRITIN 395 (H) 07/13/2020   IRON 71 07/13/2020   TIBC 239 07/13/2020   UIBC 168 07/13/2020   IRONPCTSAT 30 07/13/2020   Lab Results  Component Value Date   RBC 3.17 (L) 10/25/2020   No results found for: KPAFRELGTCHN, LAMBDASER, KAPLAMBRATIO No results found for: IGGSERUM, IGA, IGMSERUM No results found for: Odetta Pink, SPEI   Chemistry      Component Value Date/Time   NA 141 09/10/2020 0900   NA 140 06/28/2011 0950   K 3.3 (L) 09/10/2020 0900   K 4.7 06/28/2011 0950   CL 108 09/10/2020 0900   CL 100 06/28/2011 0950   CO2 27 09/10/2020 0900   CO2 29 06/28/2011 0950   BUN 15 09/10/2020 0900   BUN 10 06/28/2011 0950   CREATININE 0.74 09/10/2020 0900   CREATININE 0.7 06/28/2011 0950      Component Value Date/Time   CALCIUM 9.1 09/10/2020 0900   CALCIUM 9.0 06/28/2011 0950   ALKPHOS 96 09/10/2020 0900   ALKPHOS 110 (H) 12/22/2010 0850   AST 14 (L) 09/10/2020 0900   ALT 8 09/10/2020 0900   ALT 18 12/22/2010 0850   BILITOT 0.4 09/10/2020 0900       Impression and Plan: Jerome Kelley is a very pleasant 80 yo caucasian gentleman with previous history of right lung cancer as well as colon cancer 10 years ago.   He now has stage IIIb adenocarcinoma of the lung, right middle lobe.  He has had upfront chemotherapy.   He had a very nice response to chemotherapy.  He now has these mediastinal lymph nodes that are active.  We will go ahead with radiation and low-dose chemotherapy.  I feel the low-dose chemotherapy would certainly be reasonable.  He should tolerate this nicely.  I think will help make the radiation more potent.  I think  he will start radiation next week.  We will give 6 weeks of carboplatinum/Taxol.  I explained all this to him.  He is in agreement.  I will plan to see him back in about 3 weeks.    [name], MD 3/14/20229:16 AM

## 2020-10-25 NOTE — Telephone Encounter (Signed)
appts made and printed for pt per 10/25/20 los, appts sch out as pt will meet with rad/onc and therefore they can work around his chemo in place. Pt aware that some of these appts may have to be altered.   Jerome Kelley

## 2020-10-25 NOTE — Progress Notes (Signed)
DISCONTINUE OFF PATHWAY REGIMEN - Non-Small Cell Lung   [id number]:Pembrolizumab 200 mg  IV D1 + Pemetrexed 500 mg/m2 IV D1 + Carboplatin AUC=5 IV D1 q21 Days:   A cycle is every 21 days:     Pembrolizumab      Pemetrexed      Carboplatin   **Always confirm dose/schedule in your pharmacy ordering system**  REASON: Other Reason PRIOR TREATMENT: Off Pathway: Pembrolizumab 200 mg  IV D1 + Pemetrexed 500 mg/m2 IV D1 + Carboplatin AUC=5 IV D1 q21 Days TREATMENT RESPONSE: Partial Response (PR)  START OFF PATHWAY REGIMEN - Non-Small Cell Lung   [id number]:Carboplatin + Paclitaxel (2/50) + RT weekly x 6 weeks:   Administer weekly during RT:     Paclitaxel      Carboplatin   **Always confirm dose/schedule in your pharmacy ordering system**  Patient Characteristics: Local Recurrence Therapeutic Status: Local Recurrence Intent of Therapy: Non-Curative / Palliative Intent, Discussed with Patient

## 2020-10-26 LAB — T4: T4, Total: 7.5 ug/dL (ref 4.5–12.0)

## 2020-10-27 ENCOUNTER — Other Ambulatory Visit: Payer: Self-pay

## 2020-10-27 ENCOUNTER — Ambulatory Visit
Admission: RE | Admit: 2020-10-27 | Discharge: 2020-10-27 | Disposition: A | Discharge status: Home / Self Care | Payer: Medicare Other | Source: Ambulatory Visit | Attending: Radiation Oncology | Admitting: Radiation Oncology

## 2020-10-27 DIAGNOSIS — Z510 Encounter for antineoplastic radiation therapy: Secondary | ICD-10-CM | POA: Diagnosis not present

## 2020-10-27 DIAGNOSIS — C342 Malignant neoplasm of middle lobe, bronchus or lung: Secondary | ICD-10-CM | POA: Diagnosis not present

## 2020-10-27 DIAGNOSIS — C3411 Malignant neoplasm of upper lobe, right bronchus or lung: Secondary | ICD-10-CM

## 2020-10-29 ENCOUNTER — Encounter: Payer: Self-pay | Admitting: *Deleted

## 2020-10-29 ENCOUNTER — Other Ambulatory Visit: Payer: Self-pay | Admitting: Hematology & Oncology

## 2020-10-29 DIAGNOSIS — C3411 Malignant neoplasm of upper lobe, right bronchus or lung: Secondary | ICD-10-CM

## 2020-10-29 DIAGNOSIS — Z95828 Presence of other vascular implants and grafts: Secondary | ICD-10-CM | POA: Insufficient documentation

## 2020-11-01 ENCOUNTER — Other Ambulatory Visit: Payer: Self-pay | Admitting: *Deleted

## 2020-11-01 ENCOUNTER — Inpatient Hospital Stay: Payer: Medicare Other

## 2020-11-01 ENCOUNTER — Other Ambulatory Visit: Payer: Self-pay | Admitting: Hematology & Oncology

## 2020-11-01 ENCOUNTER — Other Ambulatory Visit: Payer: Self-pay

## 2020-11-01 VITALS — BP 182/67 | HR 61 | Temp 97.5°F | Resp 18

## 2020-11-01 DIAGNOSIS — Z510 Encounter for antineoplastic radiation therapy: Secondary | ICD-10-CM | POA: Diagnosis not present

## 2020-11-01 DIAGNOSIS — C3411 Malignant neoplasm of upper lobe, right bronchus or lung: Secondary | ICD-10-CM

## 2020-11-01 DIAGNOSIS — I10 Essential (primary) hypertension: Secondary | ICD-10-CM

## 2020-11-01 DIAGNOSIS — Z85038 Personal history of other malignant neoplasm of large intestine: Secondary | ICD-10-CM | POA: Diagnosis not present

## 2020-11-01 DIAGNOSIS — Z5111 Encounter for antineoplastic chemotherapy: Secondary | ICD-10-CM | POA: Diagnosis not present

## 2020-11-01 DIAGNOSIS — F17290 Nicotine dependence, other tobacco product, uncomplicated: Secondary | ICD-10-CM | POA: Diagnosis not present

## 2020-11-01 DIAGNOSIS — C342 Malignant neoplasm of middle lobe, bronchus or lung: Secondary | ICD-10-CM | POA: Diagnosis not present

## 2020-11-01 DIAGNOSIS — Z7982 Long term (current) use of aspirin: Secondary | ICD-10-CM | POA: Diagnosis not present

## 2020-11-01 LAB — CBC WITH DIFFERENTIAL (CANCER CENTER ONLY)
Abs Immature Granulocytes: 0.04 10*3/uL (ref 0.00–0.07)
Basophils Absolute: 0 10*3/uL (ref 0.0–0.1)
Basophils Relative: 0 %
Eosinophils Absolute: 0 10*3/uL (ref 0.0–0.5)
Eosinophils Relative: 0 %
HCT: 32.5 % — ABNORMAL LOW (ref 39.0–52.0)
Hemoglobin: 11 g/dL — ABNORMAL LOW (ref 13.0–17.0)
Immature Granulocytes: 1 %
Lymphocytes Relative: 15 %
Lymphs Abs: 1 10*3/uL (ref 0.7–4.0)
MCH: 33.7 pg (ref 26.0–34.0)
MCHC: 33.8 g/dL (ref 30.0–36.0)
MCV: 99.7 fL (ref 80.0–100.0)
Monocytes Absolute: 0.7 10*3/uL (ref 0.1–1.0)
Monocytes Relative: 11 %
Neutro Abs: 5.1 10*3/uL (ref 1.7–7.7)
Neutrophils Relative %: 73 %
Platelet Count: 83 10*3/uL — ABNORMAL LOW (ref 150–400)
RBC: 3.26 MIL/uL — ABNORMAL LOW (ref 4.22–5.81)
RDW: 13.9 % (ref 11.5–15.5)
WBC Count: 6.9 10*3/uL (ref 4.0–10.5)
nRBC: 0 % (ref 0.0–0.2)

## 2020-11-01 LAB — CMP (CANCER CENTER ONLY)
ALT: 9 U/L (ref 0–44)
AST: 12 U/L — ABNORMAL LOW (ref 15–41)
Albumin: 3.7 g/dL (ref 3.5–5.0)
Alkaline Phosphatase: 110 U/L (ref 38–126)
Anion gap: 6 (ref 5–15)
BUN: 11 mg/dL (ref 8–23)
CO2: 28 mmol/L (ref 22–32)
Calcium: 9 mg/dL (ref 8.9–10.3)
Chloride: 108 mmol/L (ref 98–111)
Creatinine: 0.76 mg/dL (ref 0.61–1.24)
GFR, Estimated: >60 mL/min (ref >60)
Glucose, Bld: 100 mg/dL — ABNORMAL HIGH (ref 70–99)
Potassium: 3.5 mmol/L (ref 3.5–5.1)
Sodium: 142 mmol/L (ref 135–145)
Total Bilirubin: 0.5 mg/dL (ref 0.3–1.2)
Total Protein: 6.2 g/dL — ABNORMAL LOW (ref 6.5–8.1)

## 2020-11-01 MED ORDER — PALONOSETRON HCL INJECTION 0.25 MG/5ML
INTRAVENOUS | Status: AC
  Filled 2020-11-01: qty 5

## 2020-11-01 MED ORDER — LIDOCAINE-PRILOCAINE 2.5-2.5 % EX CREA: TOPICAL_CREAM | CUTANEOUS | 3 refills | Status: DC

## 2020-11-01 MED ORDER — SODIUM CHLORIDE 0.9 % IV SOLN
173.0000 mg | Freq: Once | INTRAVENOUS | Status: AC
  Administered 2020-11-01: 170 mg via INTRAVENOUS
  Filled 2020-11-01: qty 17

## 2020-11-01 MED ORDER — FAMOTIDINE IN NACL 20-0.9 MG/50ML-% IV SOLN
20.0000 mg | Freq: Once | INTRAVENOUS | Status: AC
  Administered 2020-11-01: 20 mg via INTRAVENOUS

## 2020-11-01 MED ORDER — PROCHLORPERAZINE MALEATE 10 MG PO TABS: 10.0000 mg | ORAL_TABLET | Freq: Four times a day (QID) | ORAL | 1 refills | Status: DC | PRN

## 2020-11-01 MED ORDER — CLONIDINE HCL 0.1 MG PO TABS
ORAL_TABLET | ORAL | Status: AC
  Filled 2020-11-01: qty 2

## 2020-11-01 MED ORDER — DEXAMETHASONE 4 MG PO TABS: 8.0000 mg | ORAL_TABLET | Freq: Every day | ORAL | 1 refills | Status: DC

## 2020-11-01 MED ORDER — SODIUM CHLORIDE 0.9 % IV SOLN
Freq: Once | INTRAVENOUS | Status: AC
  Filled 2020-11-01: qty 250

## 2020-11-01 MED ORDER — HEPARIN SOD (PORK) LOCK FLUSH 100 UNIT/ML IV SOLN
500.0000 [IU] | Freq: Once | INTRAVENOUS | Status: AC | PRN
  Administered 2020-11-01: 500 [IU]
  Filled 2020-11-01: qty 5

## 2020-11-01 MED ORDER — ONDANSETRON HCL 8 MG PO TABS: 8.0000 mg | ORAL_TABLET | Freq: Two times a day (BID) | ORAL | 1 refills | Status: DC | PRN

## 2020-11-01 MED ORDER — LORAZEPAM 0.5 MG PO TABS: 0.5000 mg | ORAL_TABLET | Freq: Four times a day (QID) | ORAL | 0 refills | Status: DC | PRN

## 2020-11-01 MED ORDER — DIPHENHYDRAMINE HCL 50 MG/ML IJ SOLN
50.0000 mg | Freq: Once | INTRAMUSCULAR | Status: AC
  Administered 2020-11-01: 50 mg via INTRAVENOUS

## 2020-11-01 MED ORDER — DIPHENHYDRAMINE HCL 50 MG/ML IJ SOLN
INTRAMUSCULAR | Status: AC
  Filled 2020-11-01: qty 1

## 2020-11-01 MED ORDER — PALONOSETRON HCL INJECTION 0.25 MG/5ML
0.2500 mg | Freq: Once | INTRAVENOUS | Status: AC
  Administered 2020-11-01: 0.25 mg via INTRAVENOUS

## 2020-11-01 MED ORDER — SODIUM CHLORIDE 0.9 % IV SOLN
50.0000 mg/m2 | Freq: Once | INTRAVENOUS | Status: AC
  Administered 2020-11-01: 96 mg via INTRAVENOUS
  Filled 2020-11-01: qty 16

## 2020-11-01 MED ORDER — FAMOTIDINE IN NACL 20-0.9 MG/50ML-% IV SOLN
INTRAVENOUS | Status: AC
  Filled 2020-11-01: qty 50

## 2020-11-01 MED ORDER — CLONIDINE HCL 0.1 MG PO TABS
0.2000 mg | ORAL_TABLET | Freq: Once | ORAL | Status: AC
  Administered 2020-11-01: 0.2 mg via ORAL

## 2020-11-01 MED ORDER — SODIUM CHLORIDE 0.9 % IV SOLN
20.0000 mg | Freq: Once | INTRAVENOUS | Status: AC
  Administered 2020-11-01: 20 mg via INTRAVENOUS
  Filled 2020-11-01: qty 20

## 2020-11-01 MED ORDER — SODIUM CHLORIDE 0.9% FLUSH
10.0000 mL | INTRAVENOUS | Status: DC | PRN
  Administered 2020-11-01: 10 mL
  Filled 2020-11-01: qty 10

## 2020-11-01 MED ORDER — COLD PACK MISC ONCOLOGY
1.0000 | Freq: Once | Status: DC | PRN
  Filled 2020-11-01: qty 1

## 2020-11-01 NOTE — Progress Notes (Signed)
OK to treat with platelets value from today per Dr. Marin Olp. BP 202/69 at the end of Taxotere infusion. Verval order from Dr. Marin Olp for Clonidine 0.2 mg, and star Carboplatin after 30 min.

## 2020-11-01 NOTE — Patient Instructions (Signed)
[facility] Discharge Instructions for Patients Receiving Chemotherapy  Today you received the following chemotherapy agents Taxol, carboplatin. To help prevent nausea and vomiting after your treatment, we encourage you to take your nausea medication as indicated by your MD. If you develop nausea and vomiting that is not controlled by your nausea medication, call the clinic.   BELOW ARE SYMPTOMS THAT SHOULD BE REPORTED IMMEDIATELY:  *FEVER GREATER THAN 100.5 F  *CHILLS WITH OR WITHOUT FEVER  NAUSEA AND VOMITING THAT IS NOT CONTROLLED WITH YOUR NAUSEA MEDICATION  *UNUSUAL SHORTNESS OF BREATH  *UNUSUAL BRUISING OR BLEEDING  TENDERNESS IN MOUTH AND THROAT WITH OR WITHOUT PRESENCE OF ULCERS  *URINARY PROBLEMS  *BOWEL PROBLEMS  UNUSUAL RASH Items with * indicate a potential emergency and should be followed up as soon as possible.  Feel free to call the clinic should you have any questions or concerns. The clinic phone number is [phone].  Please show the [facility] at check-in to the Emergency Department and triage nurse.

## 2020-11-04 ENCOUNTER — Other Ambulatory Visit: Payer: Self-pay

## 2020-11-04 ENCOUNTER — Ambulatory Visit
Admission: RE | Admit: 2020-11-04 | Discharge: 2020-11-04 | Disposition: A | Discharge status: Home / Self Care | Payer: Medicare Other | Source: Ambulatory Visit | Attending: Radiation Oncology | Admitting: Radiation Oncology

## 2020-11-04 DIAGNOSIS — C3411 Malignant neoplasm of upper lobe, right bronchus or lung: Secondary | ICD-10-CM

## 2020-11-04 DIAGNOSIS — C342 Malignant neoplasm of middle lobe, bronchus or lung: Secondary | ICD-10-CM | POA: Diagnosis not present

## 2020-11-04 DIAGNOSIS — Z510 Encounter for antineoplastic radiation therapy: Secondary | ICD-10-CM | POA: Diagnosis not present

## 2020-11-05 ENCOUNTER — Ambulatory Visit
Admission: RE | Admit: 2020-11-05 | Discharge: 2020-11-05 | Disposition: A | Discharge status: Home / Self Care | Payer: Medicare Other | Source: Ambulatory Visit | Attending: Radiation Oncology | Admitting: Radiation Oncology

## 2020-11-05 ENCOUNTER — Other Ambulatory Visit: Payer: Self-pay | Admitting: *Deleted

## 2020-11-05 DIAGNOSIS — C342 Malignant neoplasm of middle lobe, bronchus or lung: Secondary | ICD-10-CM | POA: Diagnosis not present

## 2020-11-05 DIAGNOSIS — Z510 Encounter for antineoplastic radiation therapy: Secondary | ICD-10-CM | POA: Diagnosis not present

## 2020-11-05 MED ORDER — LORAZEPAM 0.5 MG PO TABS: ORAL_TABLET | ORAL | 0 refills | Status: DC

## 2020-11-08 ENCOUNTER — Inpatient Hospital Stay: Payer: Medicare Other

## 2020-11-08 ENCOUNTER — Ambulatory Visit
Admission: RE | Admit: 2020-11-08 | Discharge: 2020-11-08 | Disposition: A | Discharge status: Home / Self Care | Payer: Medicare Other | Source: Ambulatory Visit | Attending: Radiation Oncology | Admitting: Radiation Oncology

## 2020-11-08 ENCOUNTER — Other Ambulatory Visit: Payer: Self-pay

## 2020-11-08 DIAGNOSIS — C342 Malignant neoplasm of middle lobe, bronchus or lung: Secondary | ICD-10-CM | POA: Diagnosis not present

## 2020-11-08 DIAGNOSIS — Z5111 Encounter for antineoplastic chemotherapy: Secondary | ICD-10-CM | POA: Diagnosis not present

## 2020-11-08 DIAGNOSIS — C3411 Malignant neoplasm of upper lobe, right bronchus or lung: Secondary | ICD-10-CM

## 2020-11-08 DIAGNOSIS — Z510 Encounter for antineoplastic radiation therapy: Secondary | ICD-10-CM | POA: Diagnosis not present

## 2020-11-08 DIAGNOSIS — F17290 Nicotine dependence, other tobacco product, uncomplicated: Secondary | ICD-10-CM | POA: Diagnosis not present

## 2020-11-08 DIAGNOSIS — Z85038 Personal history of other malignant neoplasm of large intestine: Secondary | ICD-10-CM | POA: Diagnosis not present

## 2020-11-08 DIAGNOSIS — Z7982 Long term (current) use of aspirin: Secondary | ICD-10-CM | POA: Diagnosis not present

## 2020-11-08 LAB — COMPREHENSIVE METABOLIC PANEL
ALT: 7 U/L (ref 0–44)
AST: 10 U/L — ABNORMAL LOW (ref 15–41)
Albumin: 3.6 g/dL (ref 3.5–5.0)
Alkaline Phosphatase: 92 U/L (ref 38–126)
Anion gap: 6 (ref 5–15)
BUN: 16 mg/dL (ref 8–23)
CO2: 27 mmol/L (ref 22–32)
Calcium: 8.9 mg/dL (ref 8.9–10.3)
Chloride: 108 mmol/L (ref 98–111)
Creatinine, Ser: 0.72 mg/dL (ref 0.61–1.24)
GFR, Estimated: >60 mL/min (ref >60)
Glucose, Bld: 125 mg/dL — ABNORMAL HIGH (ref 70–99)
Potassium: 3.6 mmol/L (ref 3.5–5.1)
Sodium: 141 mmol/L (ref 135–145)
Total Bilirubin: 0.5 mg/dL (ref 0.3–1.2)
Total Protein: 5.9 g/dL — ABNORMAL LOW (ref 6.5–8.1)

## 2020-11-08 LAB — CBC WITH DIFFERENTIAL/PLATELET
Abs Immature Granulocytes: 0.04 10*3/uL (ref 0.00–0.07)
Basophils Absolute: 0 10*3/uL (ref 0.0–0.1)
Basophils Relative: 0 %
Eosinophils Absolute: 0 10*3/uL (ref 0.0–0.5)
Eosinophils Relative: 0 %
HCT: 30.6 % — ABNORMAL LOW (ref 39.0–52.0)
Hemoglobin: 10.5 g/dL — ABNORMAL LOW (ref 13.0–17.0)
Immature Granulocytes: 1 %
Lymphocytes Relative: 13 %
Lymphs Abs: 0.8 10*3/uL (ref 0.7–4.0)
MCH: 33.5 pg (ref 26.0–34.0)
MCHC: 34.3 g/dL (ref 30.0–36.0)
MCV: 97.8 fL (ref 80.0–100.0)
Monocytes Absolute: 0.2 10*3/uL (ref 0.1–1.0)
Monocytes Relative: 3 %
Neutro Abs: 4.9 10*3/uL (ref 1.7–7.7)
Neutrophils Relative %: 83 %
Platelets: 73 10*3/uL — ABNORMAL LOW (ref 150–400)
RBC: 3.13 MIL/uL — ABNORMAL LOW (ref 4.22–5.81)
RDW: 13.2 % (ref 11.5–15.5)
WBC: 6 10*3/uL (ref 4.0–10.5)
nRBC: 0 % (ref 0.0–0.2)

## 2020-11-08 MED ORDER — FAMOTIDINE IN NACL 20-0.9 MG/50ML-% IV SOLN
20.0000 mg | Freq: Once | INTRAVENOUS | Status: AC
  Administered 2020-11-08: 20 mg via INTRAVENOUS

## 2020-11-08 MED ORDER — DIPHENHYDRAMINE HCL 50 MG/ML IJ SOLN
INTRAMUSCULAR | Status: AC
  Filled 2020-11-08: qty 1

## 2020-11-08 MED ORDER — COLD PACK MISC ONCOLOGY
1.0000 | Freq: Once | Status: DC | PRN
  Filled 2020-11-08: qty 1

## 2020-11-08 MED ORDER — FAMOTIDINE IN NACL 20-0.9 MG/50ML-% IV SOLN
INTRAVENOUS | Status: AC
  Filled 2020-11-08: qty 50

## 2020-11-08 MED ORDER — SODIUM CHLORIDE 0.9% FLUSH
10.0000 mL | INTRAVENOUS | Status: DC | PRN
Start: 2020-11-08 — End: 2020-11-08
  Administered 2020-11-08: 10 mL
  Filled 2020-11-08: qty 10

## 2020-11-08 MED ORDER — HEPARIN SOD (PORK) LOCK FLUSH 100 UNIT/ML IV SOLN
500.0000 [IU] | Freq: Once | INTRAVENOUS | Status: AC | PRN
  Administered 2020-11-08: 500 [IU]
  Filled 2020-11-08: qty 5

## 2020-11-08 MED ORDER — SODIUM CHLORIDE 0.9 % IV SOLN
173.0000 mg | Freq: Once | INTRAVENOUS | Status: AC
  Administered 2020-11-08: 170 mg via INTRAVENOUS
  Filled 2020-11-08: qty 17

## 2020-11-08 MED ORDER — SODIUM CHLORIDE 0.9 % IV SOLN
Freq: Once | INTRAVENOUS | Status: AC
  Filled 2020-11-08: qty 250

## 2020-11-08 MED ORDER — SODIUM CHLORIDE 0.9 % IV SOLN
50.0000 mg/m2 | Freq: Once | INTRAVENOUS | Status: AC
  Administered 2020-11-08: 96 mg via INTRAVENOUS
  Filled 2020-11-08: qty 16

## 2020-11-08 MED ORDER — PALONOSETRON HCL INJECTION 0.25 MG/5ML
0.2500 mg | Freq: Once | INTRAVENOUS | Status: AC
  Administered 2020-11-08: 0.25 mg via INTRAVENOUS

## 2020-11-08 MED ORDER — PALONOSETRON HCL INJECTION 0.25 MG/5ML
INTRAVENOUS | Status: AC
  Filled 2020-11-08: qty 5

## 2020-11-08 MED ORDER — DIPHENHYDRAMINE HCL 50 MG/ML IJ SOLN
50.0000 mg | Freq: Once | INTRAMUSCULAR | Status: AC
  Administered 2020-11-08: 50 mg via INTRAVENOUS

## 2020-11-08 MED ORDER — SODIUM CHLORIDE 0.9 % IV SOLN
20.0000 mg | Freq: Once | INTRAVENOUS | Status: AC
  Administered 2020-11-08: 20 mg via INTRAVENOUS
  Filled 2020-11-08: qty 20

## 2020-11-08 NOTE — Patient Instructions (Signed)

## 2020-11-08 NOTE — Patient Instructions (Signed)
Carboplatin injection What is this medicine? CARBOPLATIN (KAR boe pla tin) is a chemotherapy drug. It targets fast dividing cells, like cancer cells, and causes these cells to die. This medicine is used to treat ovarian cancer and many other cancers. This medicine may be used for other purposes; ask your health care provider or pharmacist if you have questions. COMMON BRAND NAME(S): Paraplatin What should I tell my health care provider before I take this medicine? They need to know if you have any of these conditions:  blood disorders  hearing problems  kidney disease  recent or ongoing radiation therapy  an unusual or allergic reaction to carboplatin, cisplatin, other chemotherapy, other medicines, foods, dyes, or preservatives  pregnant or trying to get pregnant  breast-feeding How should I use this medicine? This drug is usually given as an infusion into a vein. It is administered in a hospital or clinic by a specially trained health care professional. Talk to your pediatrician regarding the use of this medicine in children. Special care may be needed. Overdosage: If you think you have taken too much of this medicine contact a poison control center or emergency room at once. NOTE: This medicine is only for you. Do not share this medicine with others. What if I miss a dose? It is important not to miss a dose. Call your doctor or health care professional if you are unable to keep an appointment. What may interact with this medicine?  medicines for seizures  medicines to increase blood counts like filgrastim, pegfilgrastim, sargramostim  some antibiotics like amikacin, gentamicin, neomycin, streptomycin, tobramycin  vaccines Talk to your doctor or health care professional before taking any of these medicines:  acetaminophen  aspirin  ibuprofen  ketoprofen  naproxen This list may not describe all possible interactions. Give your health care provider a list of all the  medicines, herbs, non-prescription drugs, or dietary supplements you use. Also tell them if you smoke, drink alcohol, or use illegal drugs. Some items may interact with your medicine. What should I watch for while using this medicine? Your condition will be monitored carefully while you are receiving this medicine. You will need important blood work done while you are taking this medicine. This drug may make you feel generally unwell. This is not uncommon, as chemotherapy can affect healthy cells as well as cancer cells. Report any side effects. Continue your course of treatment even though you feel ill unless your doctor tells you to stop. In some cases, you may be given additional medicines to help with side effects. Follow all directions for their use. Call your doctor or health care professional for advice if you get a fever, chills or sore throat, or other symptoms of a cold or flu. Do not treat yourself. This drug decreases your body's ability to fight infections. Try to avoid being around people who are sick. This medicine may increase your risk to bruise or bleed. Call your doctor or health care professional if you notice any unusual bleeding. Be careful brushing and flossing your teeth or using a toothpick because you may get an infection or bleed more easily. If you have any dental work done, tell your dentist you are receiving this medicine. Avoid taking products that contain aspirin, acetaminophen, ibuprofen, naproxen, or ketoprofen unless instructed by your doctor. These medicines may hide a fever. Do not become pregnant while taking this medicine. Women should inform their doctor if they wish to become pregnant or think they might be pregnant. There is a  potential for serious side effects to an unborn child. Talk to your health care professional or pharmacist for more information. Do not breast-feed an infant while taking this medicine. What side effects may I notice from receiving this  medicine? Side effects that you should report to your doctor or health care professional as soon as possible:  allergic reactions like skin rash, itching or hives, swelling of the face, lips, or tongue  signs of infection - fever or chills, cough, sore throat, pain or difficulty passing urine  signs of decreased platelets or bleeding - bruising, pinpoint red spots on the skin, black, tarry stools, nosebleeds  signs of decreased red blood cells - unusually weak or tired, fainting spells, lightheadedness  breathing problems  changes in hearing  changes in vision  chest pain  high blood pressure  low blood counts - This drug may decrease the number of white blood cells, red blood cells and platelets. You may be at increased risk for infections and bleeding.  nausea and vomiting  pain, swelling, redness or irritation at the injection site  pain, tingling, numbness in the hands or feet  problems with balance, talking, walking  trouble passing urine or change in the amount of urine Side effects that usually do not require medical attention (report to your doctor or health care professional if they continue or are bothersome):  hair loss  loss of appetite  metallic taste in the mouth or changes in taste This list may not describe all possible side effects. Call your doctor for medical advice about side effects. You may report side effects to FDA at 1-800-FDA-1088. Where should I keep my medicine? This drug is given in a hospital or clinic and will not be stored at home. NOTE: This sheet is a summary. It may not cover all possible information. If you have questions about this medicine, talk to your doctor, pharmacist, or health care provider.  2021 Elsevier/Gold Standard (2007-11-05 14:38:05) Paclitaxel injection What is this medicine? PACLITAXEL (PAK li TAX el) is a chemotherapy drug. It targets fast dividing cells, like cancer cells, and causes these cells to die. This  medicine is used to treat ovarian cancer, breast cancer, lung cancer, Kaposi's sarcoma, and other cancers. This medicine may be used for other purposes; ask your health care provider or pharmacist if you have questions. COMMON BRAND NAME(S): Onxol, Taxol What should I tell my health care provider before I take this medicine? They need to know if you have any of these conditions:  history of irregular heartbeat  liver disease  low blood counts, like low white cell, platelet, or red cell counts  lung or breathing disease, like asthma  tingling of the fingers or toes, or other nerve disorder  an unusual or allergic reaction to paclitaxel, alcohol, polyoxyethylated castor oil, other chemotherapy, other medicines, foods, dyes, or preservatives  pregnant or trying to get pregnant  breast-feeding How should I use this medicine? This drug is given as an infusion into a vein. It is administered in a hospital or clinic by a specially trained health care professional. Talk to your pediatrician regarding the use of this medicine in children. Special care may be needed. Overdosage: If you think you have taken too much of this medicine contact a poison control center or emergency room at once. NOTE: This medicine is only for you. Do not share this medicine with others. What if I miss a dose? It is important not to miss your dose. Call your doctor or  health care professional if you are unable to keep an appointment. What may interact with this medicine? Do not take this medicine with any of the following medications:  live virus vaccines This medicine may also interact with the following medications:  antiviral medicines for hepatitis, HIV or AIDS  certain antibiotics like erythromycin and clarithromycin  certain medicines for fungal infections like ketoconazole and itraconazole  certain medicines for seizures like carbamazepine, phenobarbital,  phenytoin  gemfibrozil  nefazodone  rifampin  St. John's wort This list may not describe all possible interactions. Give your health care provider a list of all the medicines, herbs, non-prescription drugs, or dietary supplements you use. Also tell them if you smoke, drink alcohol, or use illegal drugs. Some items may interact with your medicine. What should I watch for while using this medicine? Your condition will be monitored carefully while you are receiving this medicine. You will need important blood work done while you are taking this medicine. This medicine can cause serious allergic reactions. To reduce your risk you will need to take other medicine(s) before treatment with this medicine. If you experience allergic reactions like skin rash, itching or hives, swelling of the face, lips, or tongue, tell your doctor or health care professional right away. In some cases, you may be given additional medicines to help with side effects. Follow all directions for their use. This drug may make you feel generally unwell. This is not uncommon, as chemotherapy can affect healthy cells as well as cancer cells. Report any side effects. Continue your course of treatment even though you feel ill unless your doctor tells you to stop. Call your doctor or health care professional for advice if you get a fever, chills or sore throat, or other symptoms of a cold or flu. Do not treat yourself. This drug decreases your body's ability to fight infections. Try to avoid being around people who are sick. This medicine may increase your risk to bruise or bleed. Call your doctor or health care professional if you notice any unusual bleeding. Be careful brushing and flossing your teeth or using a toothpick because you may get an infection or bleed more easily. If you have any dental work done, tell your dentist you are receiving this medicine. Avoid taking products that contain aspirin, acetaminophen, ibuprofen,  naproxen, or ketoprofen unless instructed by your doctor. These medicines may hide a fever. Do not become pregnant while taking this medicine. Women should inform their doctor if they wish to become pregnant or think they might be pregnant. There is a potential for serious side effects to an unborn child. Talk to your health care professional or pharmacist for more information. Do not breast-feed an infant while taking this medicine. Men are advised not to father a child while receiving this medicine. This product may contain alcohol. Ask your pharmacist or healthcare provider if this medicine contains alcohol. Be sure to tell all healthcare providers you are taking this medicine. Certain medicines, like metronidazole and disulfiram, can cause an unpleasant reaction when taken with alcohol. The reaction includes flushing, headache, nausea, vomiting, sweating, and increased thirst. The reaction can last from 30 minutes to several hours. What side effects may I notice from receiving this medicine? Side effects that you should report to your doctor or health care professional as soon as possible:  allergic reactions like skin rash, itching or hives, swelling of the face, lips, or tongue  breathing problems  changes in vision  fast, irregular heartbeat  high or  low blood pressure  mouth sores  pain, tingling, numbness in the hands or feet  signs of decreased platelets or bleeding - bruising, pinpoint red spots on the skin, black, tarry stools, blood in the urine  signs of decreased red blood cells - unusually weak or tired, feeling faint or lightheaded, falls  signs of infection - fever or chills, cough, sore throat, pain or difficulty passing urine  signs and symptoms of liver injury like dark yellow or brown urine; general ill feeling or flu-like symptoms; light-colored stools; loss of appetite; nausea; right upper belly pain; unusually weak or tired; yellowing of the eyes or  skin  swelling of the ankles, feet, hands  unusually slow heartbeat Side effects that usually do not require medical attention (report to your doctor or health care professional if they continue or are bothersome):  diarrhea  hair loss  loss of appetite  muscle or joint pain  nausea, vomiting  pain, redness, or irritation at site where injected  tiredness This list may not describe all possible side effects. Call your doctor for medical advice about side effects. You may report side effects to FDA at 1-800-FDA-1088. Where should I keep my medicine? This drug is given in a hospital or clinic and will not be stored at home. NOTE: This sheet is a summary. It may not cover all possible information. If you have questions about this medicine, talk to your doctor, pharmacist, or health care provider.  2021 Elsevier/Gold Standard (2019-07-02 13:37:23)

## 2020-11-08 NOTE — Progress Notes (Signed)
Ok to treat with platelets of 73 per Dr Marin Olp. dph

## 2020-11-09 ENCOUNTER — Ambulatory Visit
Admission: RE | Admit: 2020-11-09 | Discharge: 2020-11-09 | Disposition: A | Discharge status: Home / Self Care | Payer: Medicare Other | Source: Ambulatory Visit | Attending: Radiation Oncology | Admitting: Radiation Oncology

## 2020-11-09 DIAGNOSIS — Z510 Encounter for antineoplastic radiation therapy: Secondary | ICD-10-CM | POA: Diagnosis not present

## 2020-11-09 DIAGNOSIS — C342 Malignant neoplasm of middle lobe, bronchus or lung: Secondary | ICD-10-CM | POA: Diagnosis not present

## 2020-11-10 ENCOUNTER — Ambulatory Visit
Admission: RE | Admit: 2020-11-10 | Discharge: 2020-11-10 | Disposition: A | Discharge status: Home / Self Care | Payer: Medicare Other | Source: Ambulatory Visit | Attending: Radiation Oncology | Admitting: Radiation Oncology

## 2020-11-10 ENCOUNTER — Other Ambulatory Visit: Payer: Self-pay

## 2020-11-10 DIAGNOSIS — Z510 Encounter for antineoplastic radiation therapy: Secondary | ICD-10-CM | POA: Diagnosis not present

## 2020-11-10 DIAGNOSIS — C342 Malignant neoplasm of middle lobe, bronchus or lung: Secondary | ICD-10-CM | POA: Diagnosis not present

## 2020-11-11 ENCOUNTER — Other Ambulatory Visit: Payer: Self-pay

## 2020-11-11 ENCOUNTER — Ambulatory Visit
Admission: RE | Admit: 2020-11-11 | Discharge: 2020-11-11 | Disposition: A | Discharge status: Home / Self Care | Payer: Medicare Other | Source: Ambulatory Visit | Attending: Radiation Oncology | Admitting: Radiation Oncology

## 2020-11-11 DIAGNOSIS — C342 Malignant neoplasm of middle lobe, bronchus or lung: Secondary | ICD-10-CM | POA: Diagnosis not present

## 2020-11-11 DIAGNOSIS — Z510 Encounter for antineoplastic radiation therapy: Secondary | ICD-10-CM | POA: Diagnosis not present

## 2020-11-12 ENCOUNTER — Ambulatory Visit
Admission: RE | Admit: 2020-11-12 | Discharge: 2020-11-12 | Disposition: A | Discharge status: Home / Self Care | Payer: Medicare Other | Source: Ambulatory Visit | Attending: Radiation Oncology | Admitting: Radiation Oncology

## 2020-11-12 DIAGNOSIS — D508 Other iron deficiency anemias: Secondary | ICD-10-CM | POA: Diagnosis not present

## 2020-11-12 DIAGNOSIS — E032 Hypothyroidism due to medicaments and other exogenous substances: Secondary | ICD-10-CM | POA: Insufficient documentation

## 2020-11-12 DIAGNOSIS — C342 Malignant neoplasm of middle lobe, bronchus or lung: Secondary | ICD-10-CM | POA: Diagnosis not present

## 2020-11-12 DIAGNOSIS — C3411 Malignant neoplasm of upper lobe, right bronchus or lung: Secondary | ICD-10-CM | POA: Insufficient documentation

## 2020-11-15 ENCOUNTER — Inpatient Hospital Stay: Payer: Medicare Other

## 2020-11-15 ENCOUNTER — Ambulatory Visit
Admission: RE | Admit: 2020-11-15 | Discharge: 2020-11-15 | Disposition: A | Discharge status: Home / Self Care | Payer: Medicare Other | Source: Ambulatory Visit | Attending: Radiation Oncology | Admitting: Radiation Oncology

## 2020-11-15 ENCOUNTER — Inpatient Hospital Stay (HOSPITAL_BASED_OUTPATIENT_CLINIC_OR_DEPARTMENT_OTHER): Payer: Medicare Other | Admitting: Hematology & Oncology

## 2020-11-15 ENCOUNTER — Other Ambulatory Visit: Payer: Self-pay

## 2020-11-15 ENCOUNTER — Encounter: Payer: Self-pay | Admitting: Hematology & Oncology

## 2020-11-15 VITALS — BP 147/61 | HR 63 | Temp 97.6°F | Resp 18 | Wt 160.0 lb

## 2020-11-15 DIAGNOSIS — Z79899 Other long term (current) drug therapy: Secondary | ICD-10-CM | POA: Insufficient documentation

## 2020-11-15 DIAGNOSIS — C3411 Malignant neoplasm of upper lobe, right bronchus or lung: Secondary | ICD-10-CM | POA: Diagnosis not present

## 2020-11-15 DIAGNOSIS — D508 Other iron deficiency anemias: Secondary | ICD-10-CM | POA: Diagnosis not present

## 2020-11-15 DIAGNOSIS — Z85038 Personal history of other malignant neoplasm of large intestine: Secondary | ICD-10-CM | POA: Insufficient documentation

## 2020-11-15 DIAGNOSIS — R5383 Other fatigue: Secondary | ICD-10-CM | POA: Insufficient documentation

## 2020-11-15 DIAGNOSIS — Z7982 Long term (current) use of aspirin: Secondary | ICD-10-CM | POA: Insufficient documentation

## 2020-11-15 DIAGNOSIS — C342 Malignant neoplasm of middle lobe, bronchus or lung: Secondary | ICD-10-CM | POA: Insufficient documentation

## 2020-11-15 DIAGNOSIS — E032 Hypothyroidism due to medicaments and other exogenous substances: Secondary | ICD-10-CM | POA: Diagnosis not present

## 2020-11-15 DIAGNOSIS — Z5111 Encounter for antineoplastic chemotherapy: Secondary | ICD-10-CM | POA: Insufficient documentation

## 2020-11-15 DIAGNOSIS — R070 Pain in throat: Secondary | ICD-10-CM | POA: Insufficient documentation

## 2020-11-15 LAB — CBC WITH DIFFERENTIAL (CANCER CENTER ONLY)
Abs Immature Granulocytes: 0.01 10*3/uL (ref 0.00–0.07)
Basophils Absolute: 0 10*3/uL (ref 0.0–0.1)
Basophils Relative: 0 %
Eosinophils Absolute: 0 10*3/uL (ref 0.0–0.5)
Eosinophils Relative: 1 %
HCT: 28.1 % — ABNORMAL LOW (ref 39.0–52.0)
Hemoglobin: 9.8 g/dL — ABNORMAL LOW (ref 13.0–17.0)
Immature Granulocytes: 1 %
Lymphocytes Relative: 17 %
Lymphs Abs: 0.4 10*3/uL — ABNORMAL LOW (ref 0.7–4.0)
MCH: 33.9 pg (ref 26.0–34.0)
MCHC: 34.9 g/dL (ref 30.0–36.0)
MCV: 97.2 fL (ref 80.0–100.0)
Monocytes Absolute: 0.1 10*3/uL (ref 0.1–1.0)
Monocytes Relative: 5 %
Neutro Abs: 1.6 10*3/uL — ABNORMAL LOW (ref 1.7–7.7)
Neutrophils Relative %: 76 %
Platelet Count: 67 10*3/uL — ABNORMAL LOW (ref 150–400)
RBC: 2.89 MIL/uL — ABNORMAL LOW (ref 4.22–5.81)
RDW: 13.2 % (ref 11.5–15.5)
WBC Count: 2.1 10*3/uL — ABNORMAL LOW (ref 4.0–10.5)
nRBC: 0 % (ref 0.0–0.2)

## 2020-11-15 LAB — CMP (CANCER CENTER ONLY)
ALT: 10 U/L (ref 0–44)
AST: 12 U/L — ABNORMAL LOW (ref 15–41)
Albumin: 3.5 g/dL (ref 3.5–5.0)
Alkaline Phosphatase: 96 U/L (ref 38–126)
Anion gap: 5 (ref 5–15)
BUN: 14 mg/dL (ref 8–23)
CO2: 26 mmol/L (ref 22–32)
Calcium: 9.1 mg/dL (ref 8.9–10.3)
Chloride: 107 mmol/L (ref 98–111)
Creatinine: 0.7 mg/dL (ref 0.61–1.24)
GFR, Estimated: >60 mL/min (ref >60)
Glucose, Bld: 155 mg/dL — ABNORMAL HIGH (ref 70–99)
Potassium: 3.8 mmol/L (ref 3.5–5.1)
Sodium: 138 mmol/L (ref 135–145)
Total Bilirubin: 0.6 mg/dL (ref 0.3–1.2)
Total Protein: 6 g/dL — ABNORMAL LOW (ref 6.5–8.1)

## 2020-11-15 LAB — TSH: TSH: 0.649 u[IU]/mL (ref 0.320–4.118)

## 2020-11-15 MED ORDER — COLD PACK MISC ONCOLOGY
1.0000 | Freq: Once | Status: DC | PRN
  Filled 2020-11-15: qty 1

## 2020-11-15 MED ORDER — DEXAMETHASONE SODIUM PHOSPHATE 100 MG/10ML IJ SOLN
20.0000 mg | Freq: Once | INTRAMUSCULAR | Status: AC
  Administered 2020-11-15: 20 mg via INTRAVENOUS
  Filled 2020-11-15: qty 20

## 2020-11-15 MED ORDER — SODIUM CHLORIDE 0.9 % IV SOLN
50.0000 mg/m2 | Freq: Once | INTRAVENOUS | Status: AC
  Administered 2020-11-15: 96 mg via INTRAVENOUS
  Filled 2020-11-15: qty 16

## 2020-11-15 MED ORDER — FAMOTIDINE IN NACL 20-0.9 MG/50ML-% IV SOLN
20.0000 mg | Freq: Once | INTRAVENOUS | Status: AC
Start: 2020-11-15 — End: 2020-11-15
  Administered 2020-11-15: 20 mg via INTRAVENOUS

## 2020-11-15 MED ORDER — DIPHENHYDRAMINE HCL 50 MG/ML IJ SOLN
INTRAMUSCULAR | Status: AC
  Filled 2020-11-15: qty 1

## 2020-11-15 MED ORDER — SODIUM CHLORIDE 0.9 % IV SOLN
Freq: Once | INTRAVENOUS | Status: AC
  Filled 2020-11-15: qty 250

## 2020-11-15 MED ORDER — PALONOSETRON HCL INJECTION 0.25 MG/5ML
0.2500 mg | Freq: Once | INTRAVENOUS | Status: AC
  Administered 2020-11-15: 0.25 mg via INTRAVENOUS

## 2020-11-15 MED ORDER — PALONOSETRON HCL INJECTION 0.25 MG/5ML
INTRAVENOUS | Status: AC
  Filled 2020-11-15: qty 5

## 2020-11-15 MED ORDER — SODIUM CHLORIDE 0.9 % IV SOLN
173.0000 mg | Freq: Once | INTRAVENOUS | Status: AC
  Administered 2020-11-15: 170 mg via INTRAVENOUS
  Filled 2020-11-15: qty 17

## 2020-11-15 MED ORDER — SODIUM CHLORIDE 0.9% FLUSH
10.0000 mL | INTRAVENOUS | Status: DC | PRN
  Administered 2020-11-15: 10 mL
  Filled 2020-11-15: qty 10

## 2020-11-15 MED ORDER — HEPARIN SOD (PORK) LOCK FLUSH 100 UNIT/ML IV SOLN
500.0000 [IU] | Freq: Once | INTRAVENOUS | Status: AC | PRN
  Administered 2020-11-15: 500 [IU]
  Filled 2020-11-15: qty 5

## 2020-11-15 MED ORDER — FAMOTIDINE IN NACL 20-0.9 MG/50ML-% IV SOLN
INTRAVENOUS | Status: AC
  Filled 2020-11-15: qty 50

## 2020-11-15 MED ORDER — DIPHENHYDRAMINE HCL 50 MG/ML IJ SOLN
50.0000 mg | Freq: Once | INTRAMUSCULAR | Status: AC
  Administered 2020-11-15: 50 mg via INTRAVENOUS

## 2020-11-15 NOTE — Progress Notes (Signed)
Ok to treat with platelets of 67 per DR Marin Olp.Marland Kitchen dph

## 2020-11-15 NOTE — Patient Instructions (Signed)

## 2020-11-15 NOTE — Progress Notes (Signed)
Hematology and Oncology Follow Up Visit  KYLE LUPPINO [id number] 22-Feb-1941 80 y.o. 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 Mccullars is here today for follow-up.  He now is on radiation therapy with weekly low-dose chemotherapy.  He is done well with this so far.  He is to treatments of the carboplatinum/Taxol.  His blood counts are trending downward.  I think that we will go ahead and treat him today but then give him a break next week.  This way, he will be able to enjoy the Easter holiday with his family.  He is still smoking and.  He will not stop smoking.  He is trying to cut back.  His appetite is doing pretty well.  He has had no nausea or vomiting.   He has had some constipation.  I told him to try some MiraLAX at twice a day dosing.  There is been no leg swelling.  He has been mowing his yard.  He enjoys this.  Overall, his performance status is ECOG 2.   Medications:  Allergies as of 11/15/2020   No Known Allergies     Medication List       Accurate as of November 15, 2020 10:08 AM. If you have any questions, ask your nurse or doctor.        aspirin 81 MG chewable tablet Chew 1 tablet (81 mg total) by mouth 2 (two) times daily. What changed: when to take this   benazepril 20 MG tablet Commonly known as: LOTENSIN Take 20 mg by mouth daily.   dexamethasone 4 MG tablet Commonly known as: DECADRON 1 TAB 2 TIMES A DAY THE DAY BEFORE ALIMTA CHEMO, THEN 2 TABS ONCE A DAY FOR 3 DAYS STARTING THE DAY AFTER CISPLATIN.   dexamethasone 4 MG tablet Commonly known as: DECADRON Take 2 tablets  (8 mg total) by mouth daily. Start the day after chemotherapy for 2 days.   folic acid 1 MG tablet Commonly known as: FOLVITE Take 1 mg by mouth daily.   lidocaine-prilocaine cream Commonly known as: EMLA Apply to affected area once   LORazepam 0.5 MG tablet Commonly known as: ATIVAN Take one tablet (0.5 mg) every six hours as needed for nausea   ondansetron 8 MG tablet Commonly known as: Zofran Take 1 tablet (8 mg total) by mouth 2 (two) times daily as needed for refractory nausea / vomiting. Start on day 3 after chemo.   prochlorperazine 10 MG tablet Commonly known as: COMPAZINE Take 1 tablet (10 mg total) by mouth every 6 (six) hours as needed (Nausea or vomiting).   vitamin B-12 1000 MCG tablet Commonly known as: CYANOCOBALAMIN Take 1 tablet (1,000 mcg total) by mouth daily.       Allergies: No Known Allergies  Past Medical History, Surgical history, Social history, and Family History were reviewed and updated.  Review of Systems: Review of Systems  Constitutional: Negative.   HENT: Negative.   Eyes: Negative.   Respiratory: Negative.   Cardiovascular: Negative.   Gastrointestinal: Negative.   Genitourinary: Negative.   Musculoskeletal: Negative.   Skin: Negative.   Neurological: Negative.   Endo/Heme/Allergies: Negative.   Psychiatric/Behavioral: Negative.  Physical Exam:  weight is 160 lb (72.6 kg). His oral temperature is 97.6 F (36.4 C). His blood pressure is 147/61 (abnormal) and his pulse is 63. His respiration is 18 and oxygen saturation is 99%.   Wt Readings from Last 3 Encounters:  11/15/20 160 lb (72.6 kg)  10/25/20 160 lb 1.9 oz (72.6 kg)  10/20/20 161 lb 8 oz (73.3 kg)    Physical Exam Vitals reviewed.  HENT:     Head: Normocephalic and atraumatic.  Eyes:     Pupils: Pupils are equal, round, and reactive to light.  Cardiovascular:     Rate and Rhythm: Normal rate and regular rhythm.     Heart sounds: Normal heart sounds.   Pulmonary:     Effort: Pulmonary effort is normal.     Breath sounds: Normal breath sounds.  Abdominal:     General: Bowel sounds are normal.     Palpations: Abdomen is soft.  Musculoskeletal:        General: No tenderness or deformity. Normal range of motion.     Cervical back: Normal range of motion.  Lymphadenopathy:     Cervical: No cervical adenopathy.  Skin:    General: Skin is warm and dry.     Findings: No erythema or rash.  Neurological:     Mental Status: He is alert and oriented to person, place, and time.  Psychiatric:        Behavior: Behavior normal.        Thought Content: Thought content normal.        Judgment: Judgment normal.      Lab Results  Component Value Date   WBC 2.1 (L) 11/15/2020   HGB 9.8 (L) 11/15/2020   HCT 28.1 (L) 11/15/2020   MCV 97.2 11/15/2020   PLT 67 (L) 11/15/2020   Lab Results  Component Value Date   FERRITIN 395 (H) 07/13/2020   IRON 71 07/13/2020   TIBC 239 07/13/2020   UIBC 168 07/13/2020   IRONPCTSAT 30 07/13/2020   Lab Results  Component Value Date   RBC 2.89 (L) 11/15/2020   No results found for: KPAFRELGTCHN, LAMBDASER, KAPLAMBRATIO No results found for: IGGSERUM, IGA, IGMSERUM No results found for: Odetta Pink, SPEI   Chemistry      Component Value Date/Time   NA 138 11/15/2020 0855   NA 140 06/28/2011 0950   K 3.8 11/15/2020 0855   K 4.7 06/28/2011 0950   CL 107 11/15/2020 0855   CL 100 06/28/2011 0950   CO2 26 11/15/2020 0855   CO2 29 06/28/2011 0950   BUN 14 11/15/2020 0855   BUN 10 06/28/2011 0950   CREATININE 0.70 11/15/2020 0855   CREATININE 0.7 06/28/2011 0950      Component Value Date/Time   CALCIUM 9.1 11/15/2020 0855   CALCIUM 9.0 06/28/2011 0950   ALKPHOS 96 11/15/2020 0855   ALKPHOS 110 (H) 12/22/2010 0850   AST 12 (L) 11/15/2020 0855   ALT 10 11/15/2020 0855   ALT 18 12/22/2010 0850   BILITOT 0.6 11/15/2020 0855        Impression and Plan: Mr. Borba is a very pleasant 80 yo caucasian gentleman with previous history of right lung cancer as well as colon cancer 10 years ago.   He now has stage IIIb adenocarcinoma of the lung, right middle lobe.  He has had upfront chemotherapy.  He had a very nice response to chemotherapy.  He now has these mediastinal  lymph nodes that are active.  We will go ahead treatment today.  Again, I will give him off next week.  I think this would help him out.  I do this would help his blood counts.  We will plan to get him back to see [place] in another 4 weeks.  By then, he should be getting close to finishing up the radiation.     [name], MD 4/4/202210:08 AM

## 2020-11-15 NOTE — Patient Instructions (Signed)
[facility] Discharge Instructions for Patients Receiving Chemotherapy  Today you received the following chemotherapy agents Taxol, carboplatin. To help prevent nausea and vomiting after your treatment, we encourage you to take your nausea medication as indicated by your MD. If you develop nausea and vomiting that is not controlled by your nausea medication, call the clinic.   BELOW ARE SYMPTOMS THAT SHOULD BE REPORTED IMMEDIATELY:  *FEVER GREATER THAN 100.5 F  *CHILLS WITH OR WITHOUT FEVER  NAUSEA AND VOMITING THAT IS NOT CONTROLLED WITH YOUR NAUSEA MEDICATION  *UNUSUAL SHORTNESS OF BREATH  *UNUSUAL BRUISING OR BLEEDING  TENDERNESS IN MOUTH AND THROAT WITH OR WITHOUT PRESENCE OF ULCERS  *URINARY PROBLEMS  *BOWEL PROBLEMS  UNUSUAL RASH Items with * indicate a potential emergency and should be followed up as soon as possible.  Feel free to call the clinic should you have any questions or concerns. The clinic phone number is [phone].  Please show the [facility] at check-in to the Emergency Department and triage nurse.

## 2020-11-16 ENCOUNTER — Ambulatory Visit
Admission: RE | Admit: 2020-11-16 | Discharge: 2020-11-16 | Disposition: A | Discharge status: Home / Self Care | Payer: Medicare Other | Source: Ambulatory Visit | Attending: Radiation Oncology | Admitting: Radiation Oncology

## 2020-11-16 DIAGNOSIS — E032 Hypothyroidism due to medicaments and other exogenous substances: Secondary | ICD-10-CM | POA: Diagnosis not present

## 2020-11-16 DIAGNOSIS — D508 Other iron deficiency anemias: Secondary | ICD-10-CM | POA: Diagnosis not present

## 2020-11-16 DIAGNOSIS — C342 Malignant neoplasm of middle lobe, bronchus or lung: Secondary | ICD-10-CM | POA: Diagnosis not present

## 2020-11-16 DIAGNOSIS — C3411 Malignant neoplasm of upper lobe, right bronchus or lung: Secondary | ICD-10-CM | POA: Diagnosis not present

## 2020-11-16 LAB — T4: T4, Total: 7.5 ug/dL (ref 4.5–12.0)

## 2020-11-17 ENCOUNTER — Ambulatory Visit
Admission: RE | Admit: 2020-11-17 | Discharge: 2020-11-17 | Disposition: A | Discharge status: Home / Self Care | Payer: Medicare Other | Source: Ambulatory Visit | Attending: Radiation Oncology | Admitting: Radiation Oncology

## 2020-11-17 DIAGNOSIS — E032 Hypothyroidism due to medicaments and other exogenous substances: Secondary | ICD-10-CM | POA: Diagnosis not present

## 2020-11-17 DIAGNOSIS — C3411 Malignant neoplasm of upper lobe, right bronchus or lung: Secondary | ICD-10-CM | POA: Diagnosis not present

## 2020-11-17 DIAGNOSIS — C342 Malignant neoplasm of middle lobe, bronchus or lung: Secondary | ICD-10-CM | POA: Diagnosis not present

## 2020-11-17 DIAGNOSIS — D508 Other iron deficiency anemias: Secondary | ICD-10-CM | POA: Diagnosis not present

## 2020-11-18 ENCOUNTER — Ambulatory Visit
Admission: RE | Admit: 2020-11-18 | Discharge: 2020-11-18 | Disposition: A | Discharge status: Home / Self Care | Payer: Medicare Other | Source: Ambulatory Visit | Attending: Radiation Oncology | Admitting: Radiation Oncology

## 2020-11-18 ENCOUNTER — Other Ambulatory Visit: Payer: Self-pay

## 2020-11-18 DIAGNOSIS — D508 Other iron deficiency anemias: Secondary | ICD-10-CM | POA: Diagnosis not present

## 2020-11-18 DIAGNOSIS — E032 Hypothyroidism due to medicaments and other exogenous substances: Secondary | ICD-10-CM | POA: Diagnosis not present

## 2020-11-18 DIAGNOSIS — C342 Malignant neoplasm of middle lobe, bronchus or lung: Secondary | ICD-10-CM | POA: Diagnosis not present

## 2020-11-18 DIAGNOSIS — C3411 Malignant neoplasm of upper lobe, right bronchus or lung: Secondary | ICD-10-CM | POA: Diagnosis not present

## 2020-11-19 ENCOUNTER — Ambulatory Visit
Admission: RE | Admit: 2020-11-19 | Discharge: 2020-11-19 | Disposition: A | Discharge status: Home / Self Care | Payer: Medicare Other | Source: Ambulatory Visit | Attending: Radiation Oncology | Admitting: Radiation Oncology

## 2020-11-19 DIAGNOSIS — C3411 Malignant neoplasm of upper lobe, right bronchus or lung: Secondary | ICD-10-CM | POA: Diagnosis not present

## 2020-11-19 DIAGNOSIS — C342 Malignant neoplasm of middle lobe, bronchus or lung: Secondary | ICD-10-CM | POA: Diagnosis not present

## 2020-11-19 DIAGNOSIS — D508 Other iron deficiency anemias: Secondary | ICD-10-CM | POA: Diagnosis not present

## 2020-11-19 DIAGNOSIS — E032 Hypothyroidism due to medicaments and other exogenous substances: Secondary | ICD-10-CM | POA: Diagnosis not present

## 2020-11-22 ENCOUNTER — Other Ambulatory Visit: Payer: Medicare Other

## 2020-11-22 ENCOUNTER — Ambulatory Visit
Admission: RE | Admit: 2020-11-22 | Discharge: 2020-11-22 | Disposition: A | Discharge status: Home / Self Care | Payer: Medicare Other | Source: Ambulatory Visit | Attending: Radiation Oncology | Admitting: Radiation Oncology

## 2020-11-22 ENCOUNTER — Other Ambulatory Visit: Payer: Self-pay

## 2020-11-22 ENCOUNTER — Ambulatory Visit: Payer: Medicare Other

## 2020-11-22 DIAGNOSIS — E032 Hypothyroidism due to medicaments and other exogenous substances: Secondary | ICD-10-CM | POA: Diagnosis not present

## 2020-11-22 DIAGNOSIS — D508 Other iron deficiency anemias: Secondary | ICD-10-CM | POA: Diagnosis not present

## 2020-11-22 DIAGNOSIS — C3411 Malignant neoplasm of upper lobe, right bronchus or lung: Secondary | ICD-10-CM | POA: Diagnosis not present

## 2020-11-22 DIAGNOSIS — C342 Malignant neoplasm of middle lobe, bronchus or lung: Secondary | ICD-10-CM | POA: Diagnosis not present

## 2020-11-23 ENCOUNTER — Ambulatory Visit
Admission: RE | Admit: 2020-11-23 | Discharge: 2020-11-23 | Disposition: A | Discharge status: Home / Self Care | Payer: Medicare Other | Source: Ambulatory Visit | Attending: Radiation Oncology | Admitting: Radiation Oncology

## 2020-11-23 ENCOUNTER — Other Ambulatory Visit: Payer: Self-pay | Admitting: Radiation Oncology

## 2020-11-23 DIAGNOSIS — D508 Other iron deficiency anemias: Secondary | ICD-10-CM | POA: Diagnosis not present

## 2020-11-23 DIAGNOSIS — E032 Hypothyroidism due to medicaments and other exogenous substances: Secondary | ICD-10-CM | POA: Diagnosis not present

## 2020-11-23 DIAGNOSIS — C3411 Malignant neoplasm of upper lobe, right bronchus or lung: Secondary | ICD-10-CM | POA: Diagnosis not present

## 2020-11-23 DIAGNOSIS — C342 Malignant neoplasm of middle lobe, bronchus or lung: Secondary | ICD-10-CM | POA: Diagnosis not present

## 2020-11-23 MED ORDER — SUCRALFATE 1 G PO TABS: 1.0000 g | ORAL_TABLET | Freq: Three times a day (TID) | ORAL | 1 refills | Status: DC

## 2020-11-24 ENCOUNTER — Ambulatory Visit
Admission: RE | Admit: 2020-11-24 | Discharge: 2020-11-24 | Disposition: A | Discharge status: Home / Self Care | Payer: Medicare Other | Source: Ambulatory Visit | Attending: Radiation Oncology | Admitting: Radiation Oncology

## 2020-11-24 ENCOUNTER — Other Ambulatory Visit: Payer: Self-pay

## 2020-11-24 DIAGNOSIS — C342 Malignant neoplasm of middle lobe, bronchus or lung: Secondary | ICD-10-CM | POA: Diagnosis not present

## 2020-11-24 DIAGNOSIS — E032 Hypothyroidism due to medicaments and other exogenous substances: Secondary | ICD-10-CM | POA: Diagnosis not present

## 2020-11-24 DIAGNOSIS — C3411 Malignant neoplasm of upper lobe, right bronchus or lung: Secondary | ICD-10-CM | POA: Diagnosis not present

## 2020-11-24 DIAGNOSIS — D508 Other iron deficiency anemias: Secondary | ICD-10-CM | POA: Diagnosis not present

## 2020-11-25 ENCOUNTER — Ambulatory Visit
Admission: RE | Admit: 2020-11-25 | Discharge: 2020-11-25 | Disposition: A | Discharge status: Home / Self Care | Payer: Medicare Other | Source: Ambulatory Visit | Attending: Radiation Oncology | Admitting: Radiation Oncology

## 2020-11-25 ENCOUNTER — Other Ambulatory Visit: Payer: Self-pay

## 2020-11-25 DIAGNOSIS — C342 Malignant neoplasm of middle lobe, bronchus or lung: Secondary | ICD-10-CM | POA: Diagnosis not present

## 2020-11-25 DIAGNOSIS — E032 Hypothyroidism due to medicaments and other exogenous substances: Secondary | ICD-10-CM | POA: Diagnosis not present

## 2020-11-25 DIAGNOSIS — D508 Other iron deficiency anemias: Secondary | ICD-10-CM | POA: Diagnosis not present

## 2020-11-25 DIAGNOSIS — C3411 Malignant neoplasm of upper lobe, right bronchus or lung: Secondary | ICD-10-CM | POA: Diagnosis not present

## 2020-11-26 ENCOUNTER — Ambulatory Visit
Admission: RE | Admit: 2020-11-26 | Discharge: 2020-11-26 | Disposition: A | Discharge status: Home / Self Care | Payer: Medicare Other | Source: Ambulatory Visit | Attending: Radiation Oncology | Admitting: Radiation Oncology

## 2020-11-26 DIAGNOSIS — E032 Hypothyroidism due to medicaments and other exogenous substances: Secondary | ICD-10-CM | POA: Diagnosis not present

## 2020-11-26 DIAGNOSIS — C342 Malignant neoplasm of middle lobe, bronchus or lung: Secondary | ICD-10-CM | POA: Diagnosis not present

## 2020-11-26 DIAGNOSIS — D508 Other iron deficiency anemias: Secondary | ICD-10-CM | POA: Diagnosis not present

## 2020-11-26 DIAGNOSIS — C3411 Malignant neoplasm of upper lobe, right bronchus or lung: Secondary | ICD-10-CM | POA: Diagnosis not present

## 2020-11-29 ENCOUNTER — Inpatient Hospital Stay: Payer: Medicare Other

## 2020-11-29 ENCOUNTER — Ambulatory Visit
Admission: RE | Admit: 2020-11-29 | Discharge: 2020-11-29 | Disposition: A | Discharge status: Home / Self Care | Payer: Medicare Other | Source: Ambulatory Visit | Attending: Radiation Oncology | Admitting: Radiation Oncology

## 2020-11-29 ENCOUNTER — Other Ambulatory Visit: Payer: Self-pay

## 2020-11-29 DIAGNOSIS — D508 Other iron deficiency anemias: Secondary | ICD-10-CM | POA: Diagnosis not present

## 2020-11-29 DIAGNOSIS — C342 Malignant neoplasm of middle lobe, bronchus or lung: Secondary | ICD-10-CM | POA: Diagnosis not present

## 2020-11-29 DIAGNOSIS — C3411 Malignant neoplasm of upper lobe, right bronchus or lung: Secondary | ICD-10-CM

## 2020-11-29 DIAGNOSIS — E032 Hypothyroidism due to medicaments and other exogenous substances: Secondary | ICD-10-CM | POA: Diagnosis not present

## 2020-11-29 LAB — COMPREHENSIVE METABOLIC PANEL
ALT: 7 U/L (ref 0–44)
AST: 9 U/L — ABNORMAL LOW (ref 15–41)
Albumin: 3.4 g/dL — ABNORMAL LOW (ref 3.5–5.0)
Alkaline Phosphatase: 87 U/L (ref 38–126)
Anion gap: 8 (ref 5–15)
BUN: 15 mg/dL (ref 8–23)
CO2: 24 mmol/L (ref 22–32)
Calcium: 9.3 mg/dL (ref 8.9–10.3)
Chloride: 107 mmol/L (ref 98–111)
Creatinine, Ser: 0.71 mg/dL (ref 0.61–1.24)
GFR, Estimated: >60 mL/min (ref >60)
Glucose, Bld: 133 mg/dL — ABNORMAL HIGH (ref 70–99)
Potassium: 3.7 mmol/L (ref 3.5–5.1)
Sodium: 139 mmol/L (ref 135–145)
Total Bilirubin: 0.6 mg/dL (ref 0.3–1.2)
Total Protein: 6.3 g/dL — ABNORMAL LOW (ref 6.5–8.1)

## 2020-11-29 LAB — CBC WITH DIFFERENTIAL/PLATELET
Abs Immature Granulocytes: 0.04 10*3/uL (ref 0.00–0.07)
Basophils Absolute: 0 10*3/uL (ref 0.0–0.1)
Basophils Relative: 0 %
Eosinophils Absolute: 0 10*3/uL (ref 0.0–0.5)
Eosinophils Relative: 0 %
HCT: 25.3 % — ABNORMAL LOW (ref 39.0–52.0)
Hemoglobin: 8.6 g/dL — ABNORMAL LOW (ref 13.0–17.0)
Immature Granulocytes: 1 %
Lymphocytes Relative: 19 %
Lymphs Abs: 0.6 10*3/uL — ABNORMAL LOW (ref 0.7–4.0)
MCH: 32.8 pg (ref 26.0–34.0)
MCHC: 34 g/dL (ref 30.0–36.0)
MCV: 96.6 fL (ref 80.0–100.0)
Monocytes Absolute: 0.4 10*3/uL (ref 0.1–1.0)
Monocytes Relative: 13 %
Neutro Abs: 2 10*3/uL (ref 1.7–7.7)
Neutrophils Relative %: 67 %
Platelets: 70 10*3/uL — ABNORMAL LOW (ref 150–400)
RBC: 2.62 MIL/uL — ABNORMAL LOW (ref 4.22–5.81)
RDW: 15.1 % (ref 11.5–15.5)
WBC: 3 10*3/uL — ABNORMAL LOW (ref 4.0–10.5)
nRBC: 0 % (ref 0.0–0.2)

## 2020-11-29 MED ORDER — SODIUM CHLORIDE 0.9 % IV SOLN
Freq: Once | INTRAVENOUS | Status: AC
  Filled 2020-11-29: qty 250

## 2020-11-29 MED ORDER — DIPHENHYDRAMINE HCL 50 MG/ML IJ SOLN
INTRAMUSCULAR | Status: AC
  Filled 2020-11-29: qty 1

## 2020-11-29 MED ORDER — HEPARIN SOD (PORK) LOCK FLUSH 100 UNIT/ML IV SOLN
500.0000 [IU] | Freq: Once | INTRAVENOUS | Status: AC | PRN
  Administered 2020-11-29: 500 [IU]
  Filled 2020-11-29: qty 5

## 2020-11-29 MED ORDER — SODIUM CHLORIDE 0.9 % IV SOLN
173.0000 mg | Freq: Once | INTRAVENOUS | Status: AC
  Administered 2020-11-29: 170 mg via INTRAVENOUS
  Filled 2020-11-29: qty 17

## 2020-11-29 MED ORDER — FAMOTIDINE IN NACL 20-0.9 MG/50ML-% IV SOLN
20.0000 mg | Freq: Once | INTRAVENOUS | Status: AC
  Administered 2020-11-29: 20 mg via INTRAVENOUS

## 2020-11-29 MED ORDER — SODIUM CHLORIDE 0.9 % IV SOLN
10.0000 mg | Freq: Once | INTRAVENOUS | Status: AC
  Administered 2020-11-29: 10 mg via INTRAVENOUS
  Filled 2020-11-29: qty 10

## 2020-11-29 MED ORDER — PALONOSETRON HCL INJECTION 0.25 MG/5ML
0.2500 mg | Freq: Once | INTRAVENOUS | Status: AC
  Administered 2020-11-29: 0.25 mg via INTRAVENOUS

## 2020-11-29 MED ORDER — SODIUM CHLORIDE 0.9% FLUSH
10.0000 mL | INTRAVENOUS | Status: DC | PRN
  Administered 2020-11-29: 10 mL
  Filled 2020-11-29: qty 10

## 2020-11-29 MED ORDER — SODIUM CHLORIDE 0.9 % IV SOLN
50.0000 mg/m2 | Freq: Once | INTRAVENOUS | Status: AC
  Administered 2020-11-29: 96 mg via INTRAVENOUS
  Filled 2020-11-29: qty 16

## 2020-11-29 MED ORDER — PALONOSETRON HCL INJECTION 0.25 MG/5ML
INTRAVENOUS | Status: AC
  Filled 2020-11-29: qty 10

## 2020-11-29 MED ORDER — FAMOTIDINE IN NACL 20-0.9 MG/50ML-% IV SOLN
INTRAVENOUS | Status: AC
  Filled 2020-11-29: qty 50

## 2020-11-29 MED ORDER — DIPHENHYDRAMINE HCL 50 MG/ML IJ SOLN
50.0000 mg | Freq: Once | INTRAMUSCULAR | Status: AC
  Administered 2020-11-29: 50 mg via INTRAVENOUS

## 2020-11-29 NOTE — Patient Instructions (Signed)
Paclitaxel injection What is this medicine? PACLITAXEL (PAK li TAX el) is a chemotherapy drug. It targets fast dividing cells, like cancer cells, and causes these cells to die. This medicine is used to treat ovarian cancer, breast cancer, lung cancer, Kaposi's sarcoma, and other cancers. This medicine may be used for other purposes; ask your health care provider or pharmacist if you have questions. COMMON BRAND NAME(S): Onxol, Taxol What should I tell my health care provider before I take this medicine? They need to know if you have any of these conditions:  history of irregular heartbeat  liver disease  low blood counts, like low white cell, platelet, or red cell counts  lung or breathing disease, like asthma  tingling of the fingers or toes, or other nerve disorder  an unusual or allergic reaction to paclitaxel, alcohol, polyoxyethylated castor oil, other chemotherapy, other medicines, foods, dyes, or preservatives  pregnant or trying to get pregnant  breast-feeding How should I use this medicine? This drug is given as an infusion into a vein. It is administered in a hospital or clinic by a specially trained health care professional. Talk to your pediatrician regarding the use of this medicine in children. Special care may be needed. Overdosage: If you think you have taken too much of this medicine contact a poison control center or emergency room at once. NOTE: This medicine is only for you. Do not share this medicine with others. What if I miss a dose? It is important not to miss your dose. Call your doctor or health care professional if you are unable to keep an appointment. What may interact with this medicine? Do not take this medicine with any of the following medications:  live virus vaccines This medicine may also interact with the following medications:  antiviral medicines for hepatitis, HIV or AIDS  certain antibiotics like erythromycin and clarithromycin  certain  medicines for fungal infections like ketoconazole and itraconazole  certain medicines for seizures like carbamazepine, phenobarbital, phenytoin  gemfibrozil  nefazodone  rifampin  St. John's wort This list may not describe all possible interactions. Give your health care provider a list of all the medicines, herbs, non-prescription drugs, or dietary supplements you use. Also tell them if you smoke, drink alcohol, or use illegal drugs. Some items may interact with your medicine. What should I watch for while using this medicine? Your condition will be monitored carefully while you are receiving this medicine. You will need important blood work done while you are taking this medicine. This medicine can cause serious allergic reactions. To reduce your risk you will need to take other medicine(s) before treatment with this medicine. If you experience allergic reactions like skin rash, itching or hives, swelling of the face, lips, or tongue, tell your doctor or health care professional right away. In some cases, you may be given additional medicines to help with side effects. Follow all directions for their use. This drug may make you feel generally unwell. This is not uncommon, as chemotherapy can affect healthy cells as well as cancer cells. Report any side effects. Continue your course of treatment even though you feel ill unless your doctor tells you to stop. Call your doctor or health care professional for advice if you get a fever, chills or sore throat, or other symptoms of a cold or flu. Do not treat yourself. This drug decreases your body's ability to fight infections. Try to avoid being around people who are sick. This medicine may increase your risk to bruise  or bleed. Call your doctor or health care professional if you notice any unusual bleeding. Be careful brushing and flossing your teeth or using a toothpick because you may get an infection or bleed more easily. If you have any dental  work done, tell your dentist you are receiving this medicine. Avoid taking products that contain aspirin, acetaminophen, ibuprofen, naproxen, or ketoprofen unless instructed by your doctor. These medicines may hide a fever. Do not become pregnant while taking this medicine. Women should inform their doctor if they wish to become pregnant or think they might be pregnant. There is a potential for serious side effects to an unborn child. Talk to your health care professional or pharmacist for more information. Do not breast-feed an infant while taking this medicine. Men are advised not to father a child while receiving this medicine. This product may contain alcohol. Ask your pharmacist or healthcare provider if this medicine contains alcohol. Be sure to tell all healthcare providers you are taking this medicine. Certain medicines, like metronidazole and disulfiram, can cause an unpleasant reaction when taken with alcohol. The reaction includes flushing, headache, nausea, vomiting, sweating, and increased thirst. The reaction can last from 30 minutes to several hours. What side effects may I notice from receiving this medicine? Side effects that you should report to your doctor or health care professional as soon as possible:  allergic reactions like skin rash, itching or hives, swelling of the face, lips, or tongue  breathing problems  changes in vision  fast, irregular heartbeat  high or low blood pressure  mouth sores  pain, tingling, numbness in the hands or feet  signs of decreased platelets or bleeding - bruising, pinpoint red spots on the skin, black, tarry stools, blood in the urine  signs of decreased red blood cells - unusually weak or tired, feeling faint or lightheaded, falls  signs of infection - fever or chills, cough, sore throat, pain or difficulty passing urine  signs and symptoms of liver injury like dark yellow or brown urine; general ill feeling or flu-like symptoms;  light-colored stools; loss of appetite; nausea; right upper belly pain; unusually weak or tired; yellowing of the eyes or skin  swelling of the ankles, feet, hands  unusually slow heartbeat Side effects that usually do not require medical attention (report to your doctor or health care professional if they continue or are bothersome):  diarrhea  hair loss  loss of appetite  muscle or joint pain  nausea, vomiting  pain, redness, or irritation at site where injected  tiredness This list may not describe all possible side effects. Call your doctor for medical advice about side effects. You may report side effects to FDA at 1-800-FDA-1088. Where should I keep my medicine? This drug is given in a hospital or clinic and will not be stored at home. NOTE: This sheet is a summary. It may not cover all possible information. If you have questions about this medicine, talk to your doctor, pharmacist, or health care provider.  2021 Elsevier/Gold Standard (2019-07-02 13:37:23) Carboplatin injection What is this medicine? CARBOPLATIN (KAR boe pla tin) is a chemotherapy drug. It targets fast dividing cells, like cancer cells, and causes these cells to die. This medicine is used to treat ovarian cancer and many other cancers. This medicine may be used for other purposes; ask your health care provider or pharmacist if you have questions. COMMON BRAND NAME(S): Paraplatin What should I tell my health care provider before I take this medicine? They need to  know if you have any of these conditions:  blood disorders  hearing problems  kidney disease  recent or ongoing radiation therapy  an unusual or allergic reaction to carboplatin, cisplatin, other chemotherapy, other medicines, foods, dyes, or preservatives  pregnant or trying to get pregnant  breast-feeding How should I use this medicine? This drug is usually given as an infusion into a vein. It is administered in a hospital or clinic by  a specially trained health care professional. Talk to your pediatrician regarding the use of this medicine in children. Special care may be needed. Overdosage: If you think you have taken too much of this medicine contact a poison control center or emergency room at once. NOTE: This medicine is only for you. Do not share this medicine with others. What if I miss a dose? It is important not to miss a dose. Call your doctor or health care professional if you are unable to keep an appointment. What may interact with this medicine?  medicines for seizures  medicines to increase blood counts like filgrastim, pegfilgrastim, sargramostim  some antibiotics like amikacin, gentamicin, neomycin, streptomycin, tobramycin  vaccines Talk to your doctor or health care professional before taking any of these medicines:  acetaminophen  aspirin  ibuprofen  ketoprofen  naproxen This list may not describe all possible interactions. Give your health care provider a list of all the medicines, herbs, non-prescription drugs, or dietary supplements you use. Also tell them if you smoke, drink alcohol, or use illegal drugs. Some items may interact with your medicine. What should I watch for while using this medicine? Your condition will be monitored carefully while you are receiving this medicine. You will need important blood work done while you are taking this medicine. This drug may make you feel generally unwell. This is not uncommon, as chemotherapy can affect healthy cells as well as cancer cells. Report any side effects. Continue your course of treatment even though you feel ill unless your doctor tells you to stop. In some cases, you may be given additional medicines to help with side effects. Follow all directions for their use. Call your doctor or health care professional for advice if you get a fever, chills or sore throat, or other symptoms of a cold or flu. Do not treat yourself. This drug decreases  your body's ability to fight infections. Try to avoid being around people who are sick. This medicine may increase your risk to bruise or bleed. Call your doctor or health care professional if you notice any unusual bleeding. Be careful brushing and flossing your teeth or using a toothpick because you may get an infection or bleed more easily. If you have any dental work done, tell your dentist you are receiving this medicine. Avoid taking products that contain aspirin, acetaminophen, ibuprofen, naproxen, or ketoprofen unless instructed by your doctor. These medicines may hide a fever. Do not become pregnant while taking this medicine. Women should inform their doctor if they wish to become pregnant or think they might be pregnant. There is a potential for serious side effects to an unborn child. Talk to your health care professional or pharmacist for more information. Do not breast-feed an infant while taking this medicine. What side effects may I notice from receiving this medicine? Side effects that you should report to your doctor or health care professional as soon as possible:  allergic reactions like skin rash, itching or hives, swelling of the face, lips, or tongue  signs of infection - fever or  chills, cough, sore throat, pain or difficulty passing urine  signs of decreased platelets or bleeding - bruising, pinpoint red spots on the skin, black, tarry stools, nosebleeds  signs of decreased red blood cells - unusually weak or tired, fainting spells, lightheadedness  breathing problems  changes in hearing  changes in vision  chest pain  high blood pressure  low blood counts - This drug may decrease the number of white blood cells, red blood cells and platelets. You may be at increased risk for infections and bleeding.  nausea and vomiting  pain, swelling, redness or irritation at the injection site  pain, tingling, numbness in the hands or feet  problems with balance,  talking, walking  trouble passing urine or change in the amount of urine Side effects that usually do not require medical attention (report to your doctor or health care professional if they continue or are bothersome):  hair loss  loss of appetite  metallic taste in the mouth or changes in taste This list may not describe all possible side effects. Call your doctor for medical advice about side effects. You may report side effects to FDA at 1-800-FDA-1088. Where should I keep my medicine? This drug is given in a hospital or clinic and will not be stored at home. NOTE: This sheet is a summary. It may not cover all possible information. If you have questions about this medicine, talk to your doctor, pharmacist, or health care provider.  2021 Elsevier/Gold Standard (2007-11-05 14:38:05)

## 2020-11-29 NOTE — Patient Instructions (Signed)

## 2020-11-29 NOTE — Progress Notes (Signed)
Ok to treat with platelets of 70 per Dr Marin Olp. dph

## 2020-11-30 ENCOUNTER — Ambulatory Visit
Admission: RE | Admit: 2020-11-30 | Discharge: 2020-11-30 | Disposition: A | Discharge status: Home / Self Care | Payer: Medicare Other | Source: Ambulatory Visit | Attending: Radiation Oncology | Admitting: Radiation Oncology

## 2020-11-30 DIAGNOSIS — C342 Malignant neoplasm of middle lobe, bronchus or lung: Secondary | ICD-10-CM | POA: Diagnosis not present

## 2020-11-30 DIAGNOSIS — E032 Hypothyroidism due to medicaments and other exogenous substances: Secondary | ICD-10-CM | POA: Diagnosis not present

## 2020-11-30 DIAGNOSIS — D508 Other iron deficiency anemias: Secondary | ICD-10-CM | POA: Diagnosis not present

## 2020-11-30 DIAGNOSIS — C3411 Malignant neoplasm of upper lobe, right bronchus or lung: Secondary | ICD-10-CM | POA: Diagnosis not present

## 2020-12-01 ENCOUNTER — Ambulatory Visit
Admission: RE | Admit: 2020-12-01 | Discharge: 2020-12-01 | Disposition: A | Discharge status: Home / Self Care | Payer: Medicare Other | Source: Ambulatory Visit | Attending: Radiation Oncology | Admitting: Radiation Oncology

## 2020-12-01 ENCOUNTER — Other Ambulatory Visit: Payer: Self-pay

## 2020-12-01 DIAGNOSIS — C3411 Malignant neoplasm of upper lobe, right bronchus or lung: Secondary | ICD-10-CM | POA: Diagnosis not present

## 2020-12-01 DIAGNOSIS — C342 Malignant neoplasm of middle lobe, bronchus or lung: Secondary | ICD-10-CM | POA: Diagnosis not present

## 2020-12-01 DIAGNOSIS — D508 Other iron deficiency anemias: Secondary | ICD-10-CM | POA: Diagnosis not present

## 2020-12-01 DIAGNOSIS — E032 Hypothyroidism due to medicaments and other exogenous substances: Secondary | ICD-10-CM | POA: Diagnosis not present

## 2020-12-02 ENCOUNTER — Ambulatory Visit
Admission: RE | Admit: 2020-12-02 | Discharge: 2020-12-02 | Disposition: A | Discharge status: Home / Self Care | Payer: Medicare Other | Source: Ambulatory Visit | Attending: Radiation Oncology | Admitting: Radiation Oncology

## 2020-12-02 DIAGNOSIS — C342 Malignant neoplasm of middle lobe, bronchus or lung: Secondary | ICD-10-CM | POA: Diagnosis not present

## 2020-12-02 DIAGNOSIS — D508 Other iron deficiency anemias: Secondary | ICD-10-CM | POA: Diagnosis not present

## 2020-12-02 DIAGNOSIS — E032 Hypothyroidism due to medicaments and other exogenous substances: Secondary | ICD-10-CM | POA: Diagnosis not present

## 2020-12-02 DIAGNOSIS — C3411 Malignant neoplasm of upper lobe, right bronchus or lung: Secondary | ICD-10-CM | POA: Diagnosis not present

## 2020-12-03 ENCOUNTER — Other Ambulatory Visit: Payer: Self-pay

## 2020-12-03 ENCOUNTER — Ambulatory Visit
Admission: RE | Admit: 2020-12-03 | Discharge: 2020-12-03 | Disposition: A | Discharge status: Home / Self Care | Payer: Medicare Other | Source: Ambulatory Visit | Attending: Radiation Oncology | Admitting: Radiation Oncology

## 2020-12-03 DIAGNOSIS — D508 Other iron deficiency anemias: Secondary | ICD-10-CM | POA: Diagnosis not present

## 2020-12-03 DIAGNOSIS — C342 Malignant neoplasm of middle lobe, bronchus or lung: Secondary | ICD-10-CM | POA: Diagnosis not present

## 2020-12-03 DIAGNOSIS — E032 Hypothyroidism due to medicaments and other exogenous substances: Secondary | ICD-10-CM | POA: Diagnosis not present

## 2020-12-03 DIAGNOSIS — C3411 Malignant neoplasm of upper lobe, right bronchus or lung: Secondary | ICD-10-CM | POA: Diagnosis not present

## 2020-12-06 ENCOUNTER — Inpatient Hospital Stay (HOSPITAL_BASED_OUTPATIENT_CLINIC_OR_DEPARTMENT_OTHER): Payer: Medicare Other | Admitting: Family

## 2020-12-06 ENCOUNTER — Encounter: Payer: Self-pay | Admitting: Family

## 2020-12-06 ENCOUNTER — Inpatient Hospital Stay: Payer: Medicare Other

## 2020-12-06 ENCOUNTER — Ambulatory Visit
Admission: RE | Admit: 2020-12-06 | Discharge: 2020-12-06 | Disposition: A | Discharge status: Home / Self Care | Payer: Medicare Other | Source: Ambulatory Visit | Attending: Radiation Oncology | Admitting: Radiation Oncology

## 2020-12-06 ENCOUNTER — Other Ambulatory Visit: Payer: Self-pay

## 2020-12-06 VITALS — BP 115/53 | HR 72 | Temp 97.5°F | Resp 17 | Wt 156.0 lb

## 2020-12-06 DIAGNOSIS — C3411 Malignant neoplasm of upper lobe, right bronchus or lung: Secondary | ICD-10-CM

## 2020-12-06 DIAGNOSIS — D508 Other iron deficiency anemias: Secondary | ICD-10-CM | POA: Diagnosis not present

## 2020-12-06 DIAGNOSIS — C342 Malignant neoplasm of middle lobe, bronchus or lung: Secondary | ICD-10-CM

## 2020-12-06 DIAGNOSIS — E032 Hypothyroidism due to medicaments and other exogenous substances: Secondary | ICD-10-CM | POA: Diagnosis not present

## 2020-12-06 LAB — CMP (CANCER CENTER ONLY)
ALT: 8 U/L (ref 0–44)
AST: 8 U/L — ABNORMAL LOW (ref 15–41)
Albumin: 3.4 g/dL — ABNORMAL LOW (ref 3.5–5.0)
Alkaline Phosphatase: 73 U/L (ref 38–126)
Anion gap: 6 (ref 5–15)
BUN: 18 mg/dL (ref 8–23)
CO2: 28 mmol/L (ref 22–32)
Calcium: 9.1 mg/dL (ref 8.9–10.3)
Chloride: 106 mmol/L (ref 98–111)
Creatinine: 0.69 mg/dL (ref 0.61–1.24)
GFR, Estimated: >60 mL/min
Glucose, Bld: 135 mg/dL — ABNORMAL HIGH (ref 70–99)
Potassium: 3.6 mmol/L (ref 3.5–5.1)
Sodium: 140 mmol/L (ref 135–145)
Total Bilirubin: 0.6 mg/dL (ref 0.3–1.2)
Total Protein: 5.8 g/dL — ABNORMAL LOW (ref 6.5–8.1)

## 2020-12-06 LAB — CBC WITH DIFFERENTIAL (CANCER CENTER ONLY)
Abs Immature Granulocytes: 0.03 10*3/uL (ref 0.00–0.07)
Basophils Absolute: 0 10*3/uL (ref 0.0–0.1)
Basophils Relative: 0 %
Eosinophils Absolute: 0 10*3/uL (ref 0.0–0.5)
Eosinophils Relative: 0 %
HCT: 23 % — ABNORMAL LOW (ref 39.0–52.0)
Hemoglobin: 7.9 g/dL — ABNORMAL LOW (ref 13.0–17.0)
Immature Granulocytes: 1 %
Lymphocytes Relative: 19 %
Lymphs Abs: 0.6 10*3/uL — ABNORMAL LOW (ref 0.7–4.0)
MCH: 33.2 pg (ref 26.0–34.0)
MCHC: 34.3 g/dL (ref 30.0–36.0)
MCV: 96.6 fL (ref 80.0–100.0)
Monocytes Absolute: 0.2 10*3/uL (ref 0.1–1.0)
Monocytes Relative: 6 %
Neutro Abs: 2.2 10*3/uL (ref 1.7–7.7)
Neutrophils Relative %: 74 %
Platelet Count: 58 10*3/uL — ABNORMAL LOW (ref 150–400)
RBC: 2.38 MIL/uL — ABNORMAL LOW (ref 4.22–5.81)
RDW: 15.4 % (ref 11.5–15.5)
WBC Count: 3 10*3/uL — ABNORMAL LOW (ref 4.0–10.5)
nRBC: 0 % (ref 0.0–0.2)

## 2020-12-06 LAB — LACTATE DEHYDROGENASE: LDH: 160 U/L (ref 98–192)

## 2020-12-06 NOTE — Patient Instructions (Signed)
Implanted Port Insertion, Care After This sheet gives you information about how to care for yourself after your procedure. Your health care provider may also give you more specific instructions. If you have problems or questions, contact your health care provider. What can I expect after the procedure? After the procedure, it is common to have:  Discomfort at the port insertion site.  Bruising on the skin over the port. This should improve over 3-4 days. Follow these instructions at home: Port care  After your port is placed, you will get a manufacturer's information card. The card has information about your port. Keep this card with you at all times.  Take care of the port as told by your health care provider. Ask your health care provider if you or a family member can get training for taking care of the port at home. A home health care nurse may also take care of the port.  Make sure to remember what type of port you have. Incision care  Follow instructions from your health care provider about how to take care of your port insertion site. Make sure you: ? Wash your hands with soap and water before and after you change your bandage (dressing). If soap and water are not available, use hand sanitizer. ? Change your dressing as told by your health care provider. ? Leave stitches (sutures), skin glue, or adhesive strips in place. These skin closures may need to stay in place for 2 weeks or longer. If adhesive strip edges start to loosen and curl up, you may trim the loose edges. Do not remove adhesive strips completely unless your health care provider tells you to do that.  Check your port insertion site every day for signs of infection. Check for: ? Redness, swelling, or pain. ? Fluid or blood. ? Warmth. ? Pus or a bad smell.      Activity  Return to your normal activities as told by your health care provider. Ask your health care provider what activities are safe for you.  Do not  lift anything that is heavier than 10 lb (4.5 kg), or the limit that you are told, until your health care provider says that it is safe. General instructions  Take over-the-counter and prescription medicines only as told by your health care provider.  Do not take baths, swim, or use a hot tub until your health care provider approves. Ask your health care provider if you may take showers. You may only be allowed to take sponge baths.  Do not drive for 24 hours if you were given a sedative during your procedure.  Wear a medical alert bracelet in case of an emergency. This will tell any health care providers that you have a port.  Keep all follow-up visits as told by your health care provider. This is important. Contact a health care provider if:  You cannot flush your port with saline as directed, or you cannot draw blood from the port.  You have a fever or chills.  You have redness, swelling, or pain around your port insertion site.  You have fluid or blood coming from your port insertion site.  Your port insertion site feels warm to the touch.  You have pus or a bad smell coming from the port insertion site. Get help right away if:  You have chest pain or shortness of breath.  You have bleeding from your port that you cannot control. Summary  Take care of the port as told by your   health care provider. Keep the manufacturer's information card with you at all times.  Change your dressing as told by your health care provider.  Contact a health care provider if you have a fever or chills or if you have redness, swelling, or pain around your port insertion site.  Keep all follow-up visits as told by your health care provider. This information is not intended to replace advice given to you by your health care provider. Make sure you discuss any questions you have with your health care provider. Document Revised: 02/26/2018 Document Reviewed: 02/26/2018 Elsevier Patient Education   2021 Elsevier Inc.  

## 2020-12-06 NOTE — Progress Notes (Signed)
Hematology and Oncology Follow Up Visit  Jerome Kelley [id number] [birth year] 80 y.o. 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 He notes increased fatigue and sore throat with radiation. No evidence of stomatitis or thrush on exam.  Hgb is 7.9, platelets 58 and WBC count is 3.0.  He denies blood loss. No petechiae.  No fever, chills, n/v, cough, rash, dizziness, SOB, chest pain, palpitations, abdominal pain or changes in bowel or bladder habits.  He is taking stool softeners and Miralax daily for constipation.  No swelling, tenderness, numbness or tingling in his extremities.  No falls or syncope.  He states that he has a good appetite and is staying well hydrated. His weight is 156 lbs.   ECOG Performance Status: 2 - Symptomatic, <50% confined to bed  Medications:  Allergies as of 12/06/2020   No Known Allergies     Medication List       Accurate as of December 06, 2020 12:12 PM. If you have any questions, ask your nurse or doctor.        aspirin 81 MG chewable tablet Chew 1 tablet (81 mg total) by mouth 2 (two) times daily. What changed: when to take this   benazepril 20 MG tablet Commonly known as: LOTENSIN Take 20 mg by mouth daily.   dexamethasone 4 MG tablet Commonly known as: DECADRON 1 TAB 2 TIMES A DAY THE DAY BEFORE ALIMTA CHEMO, THEN 2 TABS ONCE A DAY FOR 3 DAYS STARTING THE DAY AFTER CISPLATIN.   dexamethasone 4 MG tablet Commonly known as: DECADRON Take 2 tablets (8 mg total) by mouth daily. Start the day after chemotherapy for  2 days.   folic acid 1 MG tablet Commonly known as: FOLVITE Take 1 mg by mouth daily.   lidocaine-prilocaine cream Commonly known as: EMLA Apply to affected area once   LORazepam 0.5 MG tablet Commonly known as: ATIVAN Take one tablet (0.5 mg) every six hours as needed for nausea   ondansetron 8 MG tablet Commonly known as: Zofran Take 1 tablet (8 mg total) by mouth 2 (two) times daily as needed for refractory nausea / vomiting. Start on day 3 after chemo.   prochlorperazine 10 MG tablet Commonly known as: COMPAZINE Take 1 tablet (10 mg total) by mouth every 6 (six) hours as needed (Nausea or vomiting).   sucralfate 1 g tablet Commonly known as: Carafate Take 1 tablet (1 g total) by mouth 4 (four) times daily -  with meals and at bedtime. Crush and dissolve in 10 mL of warm water prior to swallowing   vitamin B-12 1000 MCG tablet Commonly known as: CYANOCOBALAMIN Take 1 tablet (1,000 mcg total) by mouth daily.       Allergies: No Known Allergies  Past Medical History, Surgical history, Social history, and Family History were reviewed and updated.  Review of Systems: All other 10 point review of systems is negative.   Physical Exam:  weight is 156 lb (70.8 kg). His oral temperature is 97.5 F (36.4 C) (abnormal). His blood pressure is 115/53 (abnormal) and his pulse is 72. His respiration is 17 and oxygen saturation is 100%.   Wt  Readings from Last 3 Encounters:  12/06/20 156 lb (70.8 kg)  11/15/20 160 lb (72.6 kg)  10/25/20 160 lb 1.9 oz (72.6 kg)    Ocular: Sclerae unicteric, pupils equal, round and reactive to light Ear-nose-throat: Oropharynx clear, dentition fair Lymphatic: No cervical, supraclavicular or axillary adenopathy Lungs no rales or rhonchi, good excursion bilaterally Heart regular rate and rhythm, no murmur appreciated Abd soft, nontender, positive bowel sounds MSK no focal spinal tenderness, no joint edema Neuro: non-focal, well-oriented,  appropriate affect Breasts: Deferred   Lab Results  Component Value Date   WBC 3.0 (L) 12/06/2020   HGB 7.9 (L) 12/06/2020   HCT 23.0 (L) 12/06/2020   MCV 96.6 12/06/2020   PLT 58 (L) 12/06/2020   Lab Results  Component Value Date   FERRITIN 395 (H) 07/13/2020   IRON 71 07/13/2020   TIBC 239 07/13/2020   UIBC 168 07/13/2020   IRONPCTSAT 30 07/13/2020   Lab Results  Component Value Date   RBC 2.38 (L) 12/06/2020   No results found for: KPAFRELGTCHN, LAMBDASER, KAPLAMBRATIO No results found for: IGGSERUM, IGA, IGMSERUM No results found for: Odetta Pink, SPEI   Chemistry      Component Value Date/Time   NA 140 12/06/2020 1115   NA 140 06/28/2011 0950   K 3.6 12/06/2020 1115   K 4.7 06/28/2011 0950   CL 106 12/06/2020 1115   CL 100 06/28/2011 0950   CO2 28 12/06/2020 1115   CO2 29 06/28/2011 0950   BUN 18 12/06/2020 1115   BUN 10 06/28/2011 0950   CREATININE 0.69 12/06/2020 1115   CREATININE 0.7 06/28/2011 0950      Component Value Date/Time   CALCIUM 9.1 12/06/2020 1115   CALCIUM 9.0 06/28/2011 0950   ALKPHOS 73 12/06/2020 1115   ALKPHOS 110 (H) 12/22/2010 0850   AST 8 (L) 12/06/2020 1115   ALT 8 12/06/2020 1115   ALT 18 12/22/2010 0850   BILITOT 0.6 12/06/2020 1115       Impression and Plan: Jerome Kelley is a very pleasant 80 yo caucasian gentleman with previous history of right lung cancer as well as colon cancer 10 years ago. He now has stage IIIb adenocarcinoma of the lung, right middle lobe.  He now has mediastinal lymph nodes that are active.  I spoke with Dr. Marin Olp regarding the patient's drop in Hgb and platelet count. We will hold treatment today and give him 2 weeks off.  No transfusion at this time. Patient prefers to hold off for now.  He can contact our office with any questions or concerns and will go to the ED in the event of an emergency.    Laverna Peace, NP 4/25/202212:12 PM

## 2020-12-07 ENCOUNTER — Ambulatory Visit
Admission: RE | Admit: 2020-12-07 | Discharge: 2020-12-07 | Disposition: A | Discharge status: Home / Self Care | Payer: Medicare Other | Source: Ambulatory Visit | Attending: Radiation Oncology | Admitting: Radiation Oncology

## 2020-12-07 DIAGNOSIS — E032 Hypothyroidism due to medicaments and other exogenous substances: Secondary | ICD-10-CM | POA: Diagnosis not present

## 2020-12-07 DIAGNOSIS — C342 Malignant neoplasm of middle lobe, bronchus or lung: Secondary | ICD-10-CM | POA: Diagnosis not present

## 2020-12-07 DIAGNOSIS — C3411 Malignant neoplasm of upper lobe, right bronchus or lung: Secondary | ICD-10-CM | POA: Diagnosis not present

## 2020-12-07 DIAGNOSIS — D508 Other iron deficiency anemias: Secondary | ICD-10-CM | POA: Diagnosis not present

## 2020-12-08 ENCOUNTER — Ambulatory Visit
Admission: RE | Admit: 2020-12-08 | Discharge: 2020-12-08 | Disposition: A | Discharge status: Home / Self Care | Payer: Medicare Other | Source: Ambulatory Visit | Attending: Radiation Oncology | Admitting: Radiation Oncology

## 2020-12-08 ENCOUNTER — Other Ambulatory Visit: Payer: Self-pay

## 2020-12-08 DIAGNOSIS — D508 Other iron deficiency anemias: Secondary | ICD-10-CM | POA: Diagnosis not present

## 2020-12-08 DIAGNOSIS — E032 Hypothyroidism due to medicaments and other exogenous substances: Secondary | ICD-10-CM | POA: Diagnosis not present

## 2020-12-08 DIAGNOSIS — C342 Malignant neoplasm of middle lobe, bronchus or lung: Secondary | ICD-10-CM | POA: Diagnosis not present

## 2020-12-08 DIAGNOSIS — C3411 Malignant neoplasm of upper lobe, right bronchus or lung: Secondary | ICD-10-CM | POA: Diagnosis not present

## 2020-12-09 ENCOUNTER — Ambulatory Visit
Admission: RE | Admit: 2020-12-09 | Discharge: 2020-12-09 | Disposition: A | Discharge status: Home / Self Care | Payer: Medicare Other | Source: Ambulatory Visit | Attending: Radiation Oncology | Admitting: Radiation Oncology

## 2020-12-09 DIAGNOSIS — H18513 Endothelial corneal dystrophy, bilateral: Secondary | ICD-10-CM | POA: Diagnosis not present

## 2020-12-09 DIAGNOSIS — D508 Other iron deficiency anemias: Secondary | ICD-10-CM | POA: Diagnosis not present

## 2020-12-09 DIAGNOSIS — H52203 Unspecified astigmatism, bilateral: Secondary | ICD-10-CM | POA: Diagnosis not present

## 2020-12-09 DIAGNOSIS — H3500 Unspecified background retinopathy: Secondary | ICD-10-CM | POA: Diagnosis not present

## 2020-12-09 DIAGNOSIS — C3411 Malignant neoplasm of upper lobe, right bronchus or lung: Secondary | ICD-10-CM | POA: Diagnosis not present

## 2020-12-09 DIAGNOSIS — E032 Hypothyroidism due to medicaments and other exogenous substances: Secondary | ICD-10-CM | POA: Diagnosis not present

## 2020-12-09 DIAGNOSIS — C342 Malignant neoplasm of middle lobe, bronchus or lung: Secondary | ICD-10-CM | POA: Diagnosis not present

## 2020-12-10 ENCOUNTER — Other Ambulatory Visit: Payer: Self-pay

## 2020-12-10 ENCOUNTER — Ambulatory Visit
Admission: RE | Admit: 2020-12-10 | Discharge: 2020-12-10 | Disposition: A | Discharge status: Home / Self Care | Payer: Medicare Other | Source: Ambulatory Visit | Attending: Radiation Oncology | Admitting: Radiation Oncology

## 2020-12-10 ENCOUNTER — Telehealth: Payer: Self-pay | Admitting: Orthopaedic Surgery

## 2020-12-10 DIAGNOSIS — C342 Malignant neoplasm of middle lobe, bronchus or lung: Secondary | ICD-10-CM | POA: Diagnosis not present

## 2020-12-10 DIAGNOSIS — D508 Other iron deficiency anemias: Secondary | ICD-10-CM | POA: Diagnosis not present

## 2020-12-10 DIAGNOSIS — E032 Hypothyroidism due to medicaments and other exogenous substances: Secondary | ICD-10-CM | POA: Diagnosis not present

## 2020-12-10 DIAGNOSIS — M5441 Lumbago with sciatica, right side: Secondary | ICD-10-CM

## 2020-12-10 DIAGNOSIS — C3411 Malignant neoplasm of upper lobe, right bronchus or lung: Secondary | ICD-10-CM | POA: Diagnosis not present

## 2020-12-10 DIAGNOSIS — M5442 Lumbago with sciatica, left side: Secondary | ICD-10-CM

## 2020-12-10 NOTE — Telephone Encounter (Signed)
Patient called. He would like a RX for an injection in his back. His call back number is [phone]

## 2020-12-10 NOTE — Telephone Encounter (Signed)
If he is having the same symptoms that he had in the same area when Dr. Ernestina Patches injected before, we can just send him to Dr. Ernestina Patches for repeating the same injection.

## 2020-12-10 NOTE — Telephone Encounter (Signed)
See below Do you want to see him in office first? He hasn't been seen since 2020, or just send to [facility]?

## 2020-12-13 ENCOUNTER — Ambulatory Visit
Admission: RE | Admit: 2020-12-13 | Discharge: 2020-12-13 | Disposition: A | Discharge status: Home / Self Care | Payer: Medicare Other | Source: Ambulatory Visit | Attending: Radiation Oncology | Admitting: Radiation Oncology

## 2020-12-13 DIAGNOSIS — C3411 Malignant neoplasm of upper lobe, right bronchus or lung: Secondary | ICD-10-CM | POA: Diagnosis not present

## 2020-12-13 DIAGNOSIS — D508 Other iron deficiency anemias: Secondary | ICD-10-CM | POA: Diagnosis not present

## 2020-12-13 DIAGNOSIS — E032 Hypothyroidism due to medicaments and other exogenous substances: Secondary | ICD-10-CM | POA: Diagnosis not present

## 2020-12-13 DIAGNOSIS — C342 Malignant neoplasm of middle lobe, bronchus or lung: Secondary | ICD-10-CM | POA: Diagnosis not present

## 2020-12-13 NOTE — Telephone Encounter (Signed)
I called and talked to pt. He stated the pain is the same. I informed him that Dr. Kennon Portela team will call him soon to schedule. Pt stated understanding

## 2020-12-14 ENCOUNTER — Ambulatory Visit
Admission: RE | Admit: 2020-12-14 | Discharge: 2020-12-14 | Disposition: A | Discharge status: Home / Self Care | Payer: Medicare Other | Source: Ambulatory Visit | Attending: Radiation Oncology | Admitting: Radiation Oncology

## 2020-12-14 ENCOUNTER — Other Ambulatory Visit: Payer: Self-pay

## 2020-12-14 DIAGNOSIS — C3411 Malignant neoplasm of upper lobe, right bronchus or lung: Secondary | ICD-10-CM | POA: Diagnosis not present

## 2020-12-14 DIAGNOSIS — C342 Malignant neoplasm of middle lobe, bronchus or lung: Secondary | ICD-10-CM | POA: Diagnosis not present

## 2020-12-14 DIAGNOSIS — D508 Other iron deficiency anemias: Secondary | ICD-10-CM | POA: Diagnosis not present

## 2020-12-14 DIAGNOSIS — E032 Hypothyroidism due to medicaments and other exogenous substances: Secondary | ICD-10-CM | POA: Diagnosis not present

## 2020-12-15 ENCOUNTER — Ambulatory Visit
Admission: RE | Admit: 2020-12-15 | Discharge: 2020-12-15 | Disposition: A | Discharge status: Home / Self Care | Payer: Medicare Other | Source: Ambulatory Visit | Attending: Radiation Oncology | Admitting: Radiation Oncology

## 2020-12-15 ENCOUNTER — Other Ambulatory Visit: Payer: Self-pay

## 2020-12-15 DIAGNOSIS — C342 Malignant neoplasm of middle lobe, bronchus or lung: Secondary | ICD-10-CM | POA: Diagnosis not present

## 2020-12-15 DIAGNOSIS — C3411 Malignant neoplasm of upper lobe, right bronchus or lung: Secondary | ICD-10-CM | POA: Diagnosis not present

## 2020-12-15 DIAGNOSIS — E032 Hypothyroidism due to medicaments and other exogenous substances: Secondary | ICD-10-CM | POA: Diagnosis not present

## 2020-12-15 DIAGNOSIS — D508 Other iron deficiency anemias: Secondary | ICD-10-CM | POA: Diagnosis not present

## 2020-12-20 ENCOUNTER — Other Ambulatory Visit: Payer: Self-pay

## 2020-12-20 ENCOUNTER — Inpatient Hospital Stay: Payer: Medicare Other

## 2020-12-20 ENCOUNTER — Inpatient Hospital Stay (HOSPITAL_BASED_OUTPATIENT_CLINIC_OR_DEPARTMENT_OTHER): Payer: Medicare Other | Admitting: Family

## 2020-12-20 ENCOUNTER — Encounter: Payer: Self-pay | Admitting: Family

## 2020-12-20 ENCOUNTER — Inpatient Hospital Stay: Payer: Medicare Other | Attending: Hematology & Oncology

## 2020-12-20 VITALS — BP 104/47 | HR 61 | Temp 97.9°F | Resp 17 | Ht 68.0 in | Wt 154.0 lb

## 2020-12-20 DIAGNOSIS — D508 Other iron deficiency anemias: Secondary | ICD-10-CM | POA: Diagnosis not present

## 2020-12-20 DIAGNOSIS — Z79899 Other long term (current) drug therapy: Secondary | ICD-10-CM | POA: Insufficient documentation

## 2020-12-20 DIAGNOSIS — C342 Malignant neoplasm of middle lobe, bronchus or lung: Secondary | ICD-10-CM | POA: Diagnosis not present

## 2020-12-20 DIAGNOSIS — C3411 Malignant neoplasm of upper lobe, right bronchus or lung: Secondary | ICD-10-CM

## 2020-12-20 DIAGNOSIS — Z5111 Encounter for antineoplastic chemotherapy: Secondary | ICD-10-CM | POA: Insufficient documentation

## 2020-12-20 DIAGNOSIS — R5383 Other fatigue: Secondary | ICD-10-CM | POA: Diagnosis not present

## 2020-12-20 LAB — IRON AND TIBC
Iron: 90 ug/dL (ref 42–163)
Saturation Ratios: 47 % (ref 20–55)
TIBC: 193 ug/dL — ABNORMAL LOW (ref 202–409)
UIBC: 103 ug/dL — ABNORMAL LOW (ref 117–376)

## 2020-12-20 LAB — CMP (CANCER CENTER ONLY)
ALT: 9 U/L (ref 0–44)
AST: 10 U/L — ABNORMAL LOW (ref 15–41)
Albumin: 3.2 g/dL — ABNORMAL LOW (ref 3.5–5.0)
Alkaline Phosphatase: 84 U/L (ref 38–126)
Anion gap: 9 (ref 5–15)
BUN: 23 mg/dL (ref 8–23)
CO2: 25 mmol/L (ref 22–32)
Calcium: 8.8 mg/dL — ABNORMAL LOW (ref 8.9–10.3)
Chloride: 106 mmol/L (ref 98–111)
Creatinine: 0.79 mg/dL (ref 0.61–1.24)
GFR, Estimated: >60 mL/min (ref >60)
Glucose, Bld: 144 mg/dL — ABNORMAL HIGH (ref 70–99)
Potassium: 3.5 mmol/L (ref 3.5–5.1)
Sodium: 140 mmol/L (ref 135–145)
Total Bilirubin: 1 mg/dL (ref 0.3–1.2)
Total Protein: 5.7 g/dL — ABNORMAL LOW (ref 6.5–8.1)

## 2020-12-20 LAB — CBC WITH DIFFERENTIAL (CANCER CENTER ONLY)
Abs Immature Granulocytes: 0.02 10*3/uL (ref 0.00–0.07)
Basophils Absolute: 0 10*3/uL (ref 0.0–0.1)
Basophils Relative: 0 %
Eosinophils Absolute: 0 10*3/uL (ref 0.0–0.5)
Eosinophils Relative: 0 %
HCT: 23.3 % — ABNORMAL LOW (ref 39.0–52.0)
Hemoglobin: 7.8 g/dL — ABNORMAL LOW (ref 13.0–17.0)
Immature Granulocytes: 1 %
Lymphocytes Relative: 11 %
Lymphs Abs: 0.3 10*3/uL — ABNORMAL LOW (ref 0.7–4.0)
MCH: 33.2 pg (ref 26.0–34.0)
MCHC: 33.5 g/dL (ref 30.0–36.0)
MCV: 99.1 fL (ref 80.0–100.0)
Monocytes Absolute: 0.3 10*3/uL (ref 0.1–1.0)
Monocytes Relative: 11 %
Neutro Abs: 2.4 10*3/uL (ref 1.7–7.7)
Neutrophils Relative %: 77 %
Platelet Count: 118 10*3/uL — ABNORMAL LOW (ref 150–400)
RBC: 2.35 MIL/uL — ABNORMAL LOW (ref 4.22–5.81)
RDW: 18.7 % — ABNORMAL HIGH (ref 11.5–15.5)
WBC Count: 3.1 10*3/uL — ABNORMAL LOW (ref 4.0–10.5)
nRBC: 0 % (ref 0.0–0.2)

## 2020-12-20 LAB — FERRITIN: Ferritin: 820 ng/mL — ABNORMAL HIGH (ref 24–336)

## 2020-12-20 LAB — LACTATE DEHYDROGENASE: LDH: 233 U/L — ABNORMAL HIGH (ref 98–192)

## 2020-12-20 MED ORDER — SODIUM CHLORIDE 0.9 % IV SOLN
50.0000 mg/m2 | Freq: Once | INTRAVENOUS | Status: AC
  Administered 2020-12-20: 96 mg via INTRAVENOUS
  Filled 2020-12-20: qty 16

## 2020-12-20 MED ORDER — PALONOSETRON HCL INJECTION 0.25 MG/5ML
INTRAVENOUS | Status: AC
  Filled 2020-12-20: qty 5

## 2020-12-20 MED ORDER — SODIUM CHLORIDE 0.9 % IV SOLN
Freq: Once | INTRAVENOUS | Status: AC
  Filled 2020-12-20: qty 250

## 2020-12-20 MED ORDER — PALONOSETRON HCL INJECTION 0.25 MG/5ML
0.2500 mg | Freq: Once | INTRAVENOUS | Status: AC
  Administered 2020-12-20: 0.25 mg via INTRAVENOUS

## 2020-12-20 MED ORDER — DIPHENHYDRAMINE HCL 50 MG/ML IJ SOLN
50.0000 mg | Freq: Once | INTRAMUSCULAR | Status: AC
Start: 2020-12-20 — End: 2020-12-20
  Administered 2020-12-20: 50 mg via INTRAVENOUS

## 2020-12-20 MED ORDER — SODIUM CHLORIDE 0.9% FLUSH
10.0000 mL | INTRAVENOUS | Status: DC | PRN
  Administered 2020-12-20: 10 mL
  Filled 2020-12-20: qty 10

## 2020-12-20 MED ORDER — FAMOTIDINE 20 MG IN NS 100 ML IVPB
20.0000 mg | Freq: Once | INTRAVENOUS | Status: AC
  Administered 2020-12-20: 20 mg via INTRAVENOUS
  Filled 2020-12-20: qty 20

## 2020-12-20 MED ORDER — SODIUM CHLORIDE 0.9 % IV SOLN
173.0000 mg | Freq: Once | INTRAVENOUS | Status: AC
  Administered 2020-12-20: 170 mg via INTRAVENOUS
  Filled 2020-12-20: qty 17

## 2020-12-20 MED ORDER — HEPARIN SOD (PORK) LOCK FLUSH 100 UNIT/ML IV SOLN
500.0000 [IU] | Freq: Once | INTRAVENOUS | Status: AC | PRN
  Administered 2020-12-20: 500 [IU]
  Filled 2020-12-20: qty 5

## 2020-12-20 MED ORDER — SODIUM CHLORIDE 0.9 % IV SOLN
20.0000 mg | Freq: Once | INTRAVENOUS | Status: AC
  Administered 2020-12-20: 20 mg via INTRAVENOUS
  Filled 2020-12-20: qty 20

## 2020-12-20 MED ORDER — DIPHENHYDRAMINE HCL 50 MG/ML IJ SOLN
INTRAMUSCULAR | Status: AC
  Filled 2020-12-20: qty 1

## 2020-12-20 NOTE — Patient Instructions (Signed)
[facility]  Discharge Instructions: Thank you for choosing [facility] to provide your oncology and hematology care.   If you have a lab appointment with the [facility], please go directly to the [facility] and check in at the registration area.  Wear comfortable clothing and clothing appropriate for easy access to any Portacath or PICC line.   We strive to give you quality time with your provider. You may need to reschedule your appointment if you arrive late (15 or more minutes).  Arriving late affects you and other patients whose appointments are after yours.  Also, if you miss three or more appointments without notifying the office, you may be dismissed from the clinic at the provider's discretion.      For prescription refill requests, have your pharmacy contact our office and allow 72 hours for refills to be completed.    Today you received the following chemotherapy and/or immunotherapy agents Taxol,Carboplatin injection What is this medicine? CARBOPLATIN (KAR boe pla tin) is a chemotherapy drug. It targets fast dividing cells, like cancer cells, and causes these cells to die. This medicine is used to treat ovarian cancer and many other cancers. This medicine may be used for other purposes; ask your health care provider or pharmacist if you have questions. COMMON BRAND NAME(S): Paraplatin What should I tell my health care provider before I take this medicine? They need to know if you have any of these conditions:  blood disorders  hearing problems  kidney disease  recent or ongoing radiation therapy  an unusual or allergic reaction to carboplatin, cisplatin, other chemotherapy, other medicines, foods, dyes, or preservatives  pregnant or trying to get pregnant  breast-feeding How should I use this medicine? This drug is usually given as an infusion into a vein. It is administered in a hospital or clinic by a specially trained  health care professional. Talk to your pediatrician regarding the use of this medicine in children. Special care may be needed. Overdosage: If you think you have taken too much of this medicine contact a poison control center or emergency room at once. NOTE: This medicine is only for you. Do not share this medicine with others. What if I miss a dose? It is important not to miss a dose. Call your doctor or health care professional if you are unable to keep an appointment. What may interact with this medicine?  medicines for seizures  medicines to increase blood counts like filgrastim, pegfilgrastim, sargramostim  some antibiotics like amikacin, gentamicin, neomycin, streptomycin, tobramycin  vaccines Talk to your doctor or health care professional before taking any of these medicines:  acetaminophen  aspirin  ibuprofen  ketoprofen  naproxen This list may not describe all possible interactions. Give your health care provider a list of all the medicines, herbs, non-prescription drugs, or dietary supplements you use. Also tell them if you smoke, drink alcohol, or use illegal drugs. Some items may interact with your medicine. What should I watch for while using this medicine? Your condition will be monitored carefully while you are receiving this medicine. You will need important blood work done while you are taking this medicine. This drug may make you feel generally unwell. This is not uncommon, as chemotherapy can affect healthy cells as well as cancer cells. Report any side effects. Continue your course of treatment even though you feel ill unless your doctor tells you to stop. In some cases, you may be given additional medicines to  help with side effects. Follow all directions for their use. Call your doctor or health care professional for advice if you get a fever, chills or sore throat, or other symptoms of a cold or flu. Do not treat yourself. This drug decreases your body's ability  to fight infections. Try to avoid being around people who are sick. This medicine may increase your risk to bruise or bleed. Call your doctor or health care professional if you notice any unusual bleeding. Be careful brushing and flossing your teeth or using a toothpick because you may get an infection or bleed more easily. If you have any dental work done, tell your dentist you are receiving this medicine. Avoid taking products that contain aspirin, acetaminophen, ibuprofen, naproxen, or ketoprofen unless instructed by your doctor. These medicines may hide a fever. Do not become pregnant while taking this medicine. Women should inform their doctor if they wish to become pregnant or think they might be pregnant. There is a potential for serious side effects to an unborn child. Talk to your health care professional or pharmacist for more information. Do not breast-feed an infant while taking this medicine. What side effects may I notice from receiving this medicine? Side effects that you should report to your doctor or health care professional as soon as possible:  allergic reactions like skin rash, itching or hives, swelling of the face, lips, or tongue  signs of infection - fever or chills, cough, sore throat, pain or difficulty passing urine  signs of decreased platelets or bleeding - bruising, pinpoint red spots on the skin, black, tarry stools, nosebleeds  signs of decreased red blood cells - unusually weak or tired, fainting spells, lightheadedness  breathing problems  changes in hearing  changes in vision  chest pain  high blood pressure  low blood counts - This drug may decrease the number of white blood cells, red blood cells and platelets. You may be at increased risk for infections and bleeding.  nausea and vomiting  pain, swelling, redness or irritation at the injection site  pain, tingling, numbness in the hands or feet  problems with balance, talking, walking  trouble  passing urine or change in the amount of urine Side effects that usually do not require medical attention (report to your doctor or health care professional if they continue or are bothersome):  hair loss  loss of appetite  metallic taste in the mouth or changes in taste This list may not describe all possible side effects. Call your doctor for medical advice about side effects. You may report side effects to FDA at 1-800-FDA-1088. Where should I keep my medicine? This drug is given in a hospital or clinic and will not be stored at home. NOTE: This sheet is a summary. It may not cover all possible information. If you have questions about this medicine, talk to your doctor, pharmacist, or health care provider.  2021 Elsevier/Gold Standard (2007-11-05 14:38:05) Paclitaxel injection What is this medicine? PACLITAXEL (PAK li TAX el) is a chemotherapy drug. It targets fast dividing cells, like cancer cells, and causes these cells to die. This medicine is used to treat ovarian cancer, breast cancer, lung cancer, Kaposi's sarcoma, and other cancers. This medicine may be used for other purposes; ask your health care provider or pharmacist if you have questions. COMMON BRAND NAME(S): Onxol, Taxol What should I tell my health care provider before I take this medicine? They need to know if you have any of these conditions:  history of  irregular heartbeat  liver disease  low blood counts, like low white cell, platelet, or red cell counts  lung or breathing disease, like asthma  tingling of the fingers or toes, or other nerve disorder  an unusual or allergic reaction to paclitaxel, alcohol, polyoxyethylated castor oil, other chemotherapy, other medicines, foods, dyes, or preservatives  pregnant or trying to get pregnant  breast-feeding How should I use this medicine? This drug is given as an infusion into a vein. It is administered in a hospital or clinic by a specially trained health care  professional. Talk to your pediatrician regarding the use of this medicine in children. Special care may be needed. Overdosage: If you think you have taken too much of this medicine contact a poison control center or emergency room at once. NOTE: This medicine is only for you. Do not share this medicine with others. What if I miss a dose? It is important not to miss your dose. Call your doctor or health care professional if you are unable to keep an appointment. What may interact with this medicine? Do not take this medicine with any of the following medications:  live virus vaccines This medicine may also interact with the following medications:  antiviral medicines for hepatitis, HIV or AIDS  certain antibiotics like erythromycin and clarithromycin  certain medicines for fungal infections like ketoconazole and itraconazole  certain medicines for seizures like carbamazepine, phenobarbital, phenytoin  gemfibrozil  nefazodone  rifampin  St. John's wort This list may not describe all possible interactions. Give your health care provider a list of all the medicines, herbs, non-prescription drugs, or dietary supplements you use. Also tell them if you smoke, drink alcohol, or use illegal drugs. Some items may interact with your medicine. What should I watch for while using this medicine? Your condition will be monitored carefully while you are receiving this medicine. You will need important blood work done while you are taking this medicine. This medicine can cause serious allergic reactions. To reduce your risk you will need to take other medicine(s) before treatment with this medicine. If you experience allergic reactions like skin rash, itching or hives, swelling of the face, lips, or tongue, tell your doctor or health care professional right away. In some cases, you may be given additional medicines to help with side effects. Follow all directions for their use. This drug may make you  feel generally unwell. This is not uncommon, as chemotherapy can affect healthy cells as well as cancer cells. Report any side effects. Continue your course of treatment even though you feel ill unless your doctor tells you to stop. Call your doctor or health care professional for advice if you get a fever, chills or sore throat, or other symptoms of a cold or flu. Do not treat yourself. This drug decreases your body's ability to fight infections. Try to avoid being around people who are sick. This medicine may increase your risk to bruise or bleed. Call your doctor or health care professional if you notice any unusual bleeding. Be careful brushing and flossing your teeth or using a toothpick because you may get an infection or bleed more easily. If you have any dental work done, tell your dentist you are receiving this medicine. Avoid taking products that contain aspirin, acetaminophen, ibuprofen, naproxen, or ketoprofen unless instructed by your doctor. These medicines may hide a fever. Do not become pregnant while taking this medicine. Women should inform their doctor if they wish to become pregnant or think they might  be pregnant. There is a potential for serious side effects to an unborn child. Talk to your health care professional or pharmacist for more information. Do not breast-feed an infant while taking this medicine. Men are advised not to father a child while receiving this medicine. This product may contain alcohol. Ask your pharmacist or healthcare provider if this medicine contains alcohol. Be sure to tell all healthcare providers you are taking this medicine. Certain medicines, like metronidazole and disulfiram, can cause an unpleasant reaction when taken with alcohol. The reaction includes flushing, headache, nausea, vomiting, sweating, and increased thirst. The reaction can last from 30 minutes to several hours. What side effects may I notice from receiving this medicine? Side effects that  you should report to your doctor or health care professional as soon as possible:  allergic reactions like skin rash, itching or hives, swelling of the face, lips, or tongue  breathing problems  changes in vision  fast, irregular heartbeat  high or low blood pressure  mouth sores  pain, tingling, numbness in the hands or feet  signs of decreased platelets or bleeding - bruising, pinpoint red spots on the skin, black, tarry stools, blood in the urine  signs of decreased red blood cells - unusually weak or tired, feeling faint or lightheaded, falls  signs of infection - fever or chills, cough, sore throat, pain or difficulty passing urine  signs and symptoms of liver injury like dark yellow or brown urine; general ill feeling or flu-like symptoms; light-colored stools; loss of appetite; nausea; right upper belly pain; unusually weak or tired; yellowing of the eyes or skin  swelling of the ankles, feet, hands  unusually slow heartbeat Side effects that usually do not require medical attention (report to your doctor or health care professional if they continue or are bothersome):  diarrhea  hair loss  loss of appetite  muscle or joint pain  nausea, vomiting  pain, redness, or irritation at site where injected  tiredness This list may not describe all possible side effects. Call your doctor for medical advice about side effects. You may report side effects to FDA at 1-800-FDA-1088. Where should I keep my medicine? This drug is given in a hospital or clinic and will not be stored at home. NOTE: This sheet is a summary. It may not cover all possible information. If you have questions about this medicine, talk to your doctor, pharmacist, or health care provider.  2021 Elsevier/Gold Standard (2019-07-02 13:37:23)  [place].     To help prevent nausea and vomiting after your treatment, we encourage you to take your nausea medication as directed.  BELOW ARE SYMPTOMS THAT SHOULD  BE REPORTED IMMEDIATELY: . *FEVER GREATER THAN 100.4 F (38 C) OR HIGHER . *CHILLS OR SWEATING . *NAUSEA AND VOMITING THAT IS NOT CONTROLLED WITH YOUR NAUSEA MEDICATION . *UNUSUAL SHORTNESS OF BREATH . *UNUSUAL BRUISING OR BLEEDING . *URINARY PROBLEMS (pain or burning when urinating, or frequent urination) . *BOWEL PROBLEMS (unusual diarrhea, constipation, pain near the anus) . TENDERNESS IN MOUTH AND THROAT WITH OR WITHOUT PRESENCE OF ULCERS (sore throat, sores in mouth, or a toothache) . UNUSUAL RASH, SWELLING OR PAIN  . UNUSUAL VAGINAL DISCHARGE OR ITCHING   Items with * indicate a potential emergency and should be followed up as soon as possible or go to the Emergency Department if any problems should occur.  Please show the CHEMOTHERAPY ALERT CARD or IMMUNOTHERAPY ALERT CARD at check-in to the Emergency Department and triage nurse. Should you have  questions after your visit or need to cancel or reschedule your appointment, please contact [facility]  [phone] and follow the prompts.  Office hours are 8:00 a.m. to 4:30 p.m. Monday - Friday. Please note that voicemails left after 4:00 p.m. may not be returned until the following business day.  We are closed weekends and major holidays. You have access to a nurse at all times for urgent questions. Please call the main number to the clinic [phone] and follow the prompts.  For any non-urgent questions, you may also contact your provider using MyChart. We now offer e-Visits for anyone 1 and older to request care online for non-urgent symptoms. For details visit [email or web address].   Also download the MyChart app! Go to the app store, search "MyChart", open the app, select [facility], and log in with your MyChart username and password.  Due to Covid, a mask is required upon entering the hospital/clinic. If you do not have a mask, one will be given to you upon arrival. For doctor visits, patients may have  1 support person aged 57 or older with them. For treatment visits, patients cannot have anyone with them due to current Covid guidelines and our immunocompromised population.

## 2020-12-20 NOTE — Patient Instructions (Signed)

## 2020-12-20 NOTE — Progress Notes (Addendum)
Ok to treat with Hg 7.8.  Raul Del [place], [place], BCPS, BCOP 12/20/2020 10:08 AM

## 2020-12-20 NOTE — Progress Notes (Signed)
Hematology and Oncology Follow Up Visit  Jerome Kelley [id number] 1940/12/02 80 y.o. 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 He states that he is feeling a little better. Fatigue seems a little improved.  Hgb is 7.8, MCV 99, WBC count 3.1 and platelets 118.  He states that he completed his radiation therapy last week. Today will be his last treatment.  No fever, chills, n/v, cough, rash, SOB, chest pain, palpitations, abdominal pain or changes in bowel or bladder habits.  He has occasional dizziness if he stands too quickly.  He eats lots of raisins to prevent constipation.  No episodes of bleeding. No petechiae.  No swelling, tenderness, numbness or tingling in his extremities at this time. He has occasional numbness and tingling in his fingertips.   He is followed by the pain clinic for chronic lower back and hip issues.  No falls or syncope.  His appetite comes and goes. He is making an effort to stay well hydrated throughout the day.   ECOG Performance Status: 1 - Symptomatic but completely ambulatory  Medications:  Allergies as of 12/20/2020   No Known Allergies     Medication List       Accurate as of Dec 20, 2020  8:51 AM. If you have any questions, ask your nurse or doctor.        aspirin 81 MG chewable tablet Chew 1 tablet (81 mg total) by mouth 2 (two) times daily. What changed: when to take this   benazepril 20 MG tablet Commonly known as: LOTENSIN Take 20 mg by mouth daily.   dexamethasone 4 MG tablet Commonly known as:  DECADRON 1 TAB 2 TIMES A DAY THE DAY BEFORE ALIMTA CHEMO, THEN 2 TABS ONCE A DAY FOR 3 DAYS STARTING THE DAY AFTER CISPLATIN.   dexamethasone 4 MG tablet Commonly known as: DECADRON Take 2 tablets (8 mg total) by mouth daily. Start the day after chemotherapy for 2 days.   folic acid 1 MG tablet Commonly known as: FOLVITE Take 1 mg by mouth daily.   lidocaine-prilocaine cream Commonly known as: EMLA Apply to affected area once   LORazepam 0.5 MG tablet Commonly known as: ATIVAN Take one tablet (0.5 mg) every six hours as needed for nausea   ondansetron 8 MG tablet Commonly known as: Zofran Take 1 tablet (8 mg total) by mouth 2 (two) times daily as needed for refractory nausea / vomiting. Start on day 3 after chemo.   prochlorperazine 10 MG tablet Commonly known as: COMPAZINE Take 1 tablet (10 mg total) by mouth every 6 (six) hours as needed (Nausea or vomiting).   sucralfate 1 g tablet Commonly known as: Carafate Take 1 tablet (1 g total) by mouth 4 (four) times daily -  with meals and at bedtime. Crush and dissolve in 10 mL of warm water prior to swallowing   vitamin B-12 1000 MCG tablet Commonly known as: CYANOCOBALAMIN Take 1 tablet (1,000 mcg total) by mouth daily.       Allergies: No Known Allergies  Past Medical History, Surgical history, Social history, and Family History were reviewed and updated.  Review of Systems: All other 10  point review of systems is negative.   Physical Exam:  vitals were not taken for this visit.   Wt Readings from Last 3 Encounters:  12/06/20 156 lb (70.8 kg)  11/15/20 160 lb (72.6 kg)  10/25/20 160 lb 1.9 oz (72.6 kg)    Ocular: Sclerae unicteric, pupils equal, round and reactive to light Ear-nose-throat: Oropharynx clear, dentition fair Lymphatic: No cervical or supraclavicular adenopathy Lungs no rales or rhonchi, good excursion bilaterally Heart regular rate and rhythm, no murmur appreciated Abd soft, nontender, positive  bowel sounds MSK no focal spinal tenderness, no joint edema Neuro: non-focal, well-oriented, appropriate affect Breasts: Deferred   Lab Results  Component Value Date   WBC 3.0 (L) 12/06/2020   HGB 7.9 (L) 12/06/2020   HCT 23.0 (L) 12/06/2020   MCV 96.6 12/06/2020   PLT 58 (L) 12/06/2020   Lab Results  Component Value Date   FERRITIN 395 (H) 07/13/2020   IRON 71 07/13/2020   TIBC 239 07/13/2020   UIBC 168 07/13/2020   IRONPCTSAT 30 07/13/2020   Lab Results  Component Value Date   RBC 2.38 (L) 12/06/2020   No results found for: KPAFRELGTCHN, LAMBDASER, KAPLAMBRATIO No results found for: IGGSERUM, IGA, IGMSERUM No results found for: Odetta Pink, SPEI   Chemistry      Component Value Date/Time   NA 140 12/06/2020 1115   NA 140 06/28/2011 0950   K 3.6 12/06/2020 1115   K 4.7 06/28/2011 0950   CL 106 12/06/2020 1115   CL 100 06/28/2011 0950   CO2 28 12/06/2020 1115   CO2 29 06/28/2011 0950   BUN 18 12/06/2020 1115   BUN 10 06/28/2011 0950   CREATININE 0.69 12/06/2020 1115   CREATININE 0.7 06/28/2011 0950      Component Value Date/Time   CALCIUM 9.1 12/06/2020 1115   CALCIUM 9.0 06/28/2011 0950   ALKPHOS 73 12/06/2020 1115   ALKPHOS 110 (H) 12/22/2010 0850   AST 8 (L) 12/06/2020 1115   ALT 8 12/06/2020 1115   ALT 18 12/22/2010 0850   BILITOT 0.6 12/06/2020 1115       Impression and Plan: Jerome Kelley is a very pleasant 80 yo caucasian gentleman with previous history of right lung cancer as well as colon cancer 10 years ago. He now has stage IIIb adenocarcinoma of the lung, right middle lobe.  He now has mediastinal lymph nodes that are active.  I discussed patients current labs with MD and we will proceed with his final cycle of treatment today as planned.  He finished radiation last week.  No blood at this time. We discussed symptoms of anemia and he will contact our office with any questions or concerns. We  can certainly see him sooner if needed.  Follow-up in 5 weeks with MD. Will plan to repeat scans in 3 months to assess response.   Laverna Peace, NP 5/9/20228:51 AM

## 2020-12-29 ENCOUNTER — Encounter: Payer: Self-pay | Admitting: Physical Medicine and Rehabilitation

## 2020-12-29 ENCOUNTER — Other Ambulatory Visit: Payer: Self-pay

## 2020-12-29 ENCOUNTER — Ambulatory Visit: Payer: Self-pay

## 2020-12-29 ENCOUNTER — Ambulatory Visit (INDEPENDENT_AMBULATORY_CARE_PROVIDER_SITE_OTHER): Payer: Medicare Other | Admitting: Physical Medicine and Rehabilitation

## 2020-12-29 VITALS — BP 120/82 | HR 85

## 2020-12-29 DIAGNOSIS — M5416 Radiculopathy, lumbar region: Secondary | ICD-10-CM | POA: Diagnosis not present

## 2020-12-29 DIAGNOSIS — M48061 Spinal stenosis, lumbar region without neurogenic claudication: Secondary | ICD-10-CM

## 2020-12-29 MED ORDER — METHYLPREDNISOLONE ACETATE 80 MG/ML IJ SUSP
80.0000 mg | Freq: Once | INTRAMUSCULAR | Status: AC
  Administered 2020-12-29: 80 mg

## 2020-12-29 NOTE — Progress Notes (Signed)
Pt state lower back pain that travels down to right hip. Pt state walking, standing and bending makes the pain worse. Pt state he had broken his both twice. Pt state he takes over the counter pain meds to help ease his pain.  Numeric Pain Rating Scale and Functional Assessment Average Pain 8   In the last MONTH (on 0-10 scale) has pain interfered with the following?  1. General activity like being  able to carry out your everyday physical activities such as walking, climbing stairs, carrying groceries, or moving a chair?  Rating(8)   +Driver, -BT, -Dye Allergies.

## 2020-12-29 NOTE — Patient Instructions (Signed)

## 2020-12-29 NOTE — Procedures (Signed)
Lumbosacral Transforaminal Epidural Steroid Injection - Sub-Pedicular Approach with Fluoroscopic Guidance  Patient: Jerome Kelley      Date of Birth: 1940-11-30 MRN: [id number] PCP: Kelton Pillar, MD      Visit Date: 12/29/2020   Universal Protocol:    Date/Time: 12/29/2020  Consent Given By: the patient  Position: PRONE  Additional Comments: Vital signs were monitored before and after the procedure. Patient was prepped and draped in the usual sterile fashion. The correct patient, procedure, and site was verified.   Injection Procedure Details:   Procedure diagnoses: Lumbar radiculopathy [M54.16]    Meds Administered:  Meds ordered this encounter  Medications  . methylPREDNISolone acetate (DEPO-MEDROL) injection 80 mg    Laterality: Right  Location/Site:  L5-S1  Needle:5.0 in., 22 ga.  Short bevel or Quincke spinal needle  Needle Placement: Transforaminal  Findings:    -Comments: Excellent flow of contrast along the nerve, nerve root and into the epidural space.  Procedure Details: After squaring off the end-plates to get a true AP view, the C-arm was positioned so that an oblique view of the foramen as noted above was visualized. The target area is just inferior to the "nose of the scotty dog" or sub pedicular. The soft tissues overlying this structure were infiltrated with 2-3 ml. of 1% Lidocaine without Epinephrine.  The spinal needle was inserted toward the target using a "trajectory" view along the fluoroscope beam.  Under AP and lateral visualization, the needle was advanced so it did not puncture dura and was located close the 6 O'Clock position of the pedical in AP tracterory. Biplanar projections were used to confirm position. Aspiration was confirmed to be negative for CSF and/or blood. A 1-2 ml. volume of Isovue-250 was injected and flow of contrast was noted at each level. Radiographs were obtained for documentation purposes.   After attaining the desired  flow of contrast documented above, a 0.5 to 1.0 ml test dose of 0.25% Marcaine was injected into each respective transforaminal space.  The patient was observed for 90 seconds post injection.  After no sensory deficits were reported, and normal lower extremity motor function was noted,   the above injectate was administered so that equal amounts of the injectate were placed at each foramen (level) into the transforaminal epidural space.   Additional Comments:  The patient tolerated the procedure well Dressing: 2 x 2 sterile gauze and Band-Aid    Post-procedure details: Patient was observed during the procedure. Post-procedure instructions were reviewed.  Patient left the clinic in stable condition.

## 2020-12-29 NOTE — Progress Notes (Signed)
Jerome Kelley - 79 y.o. male MRN [id number]  Date of birth: [birth year]  Office Visit Note: Visit Date: 12/29/2020 PCP: Kelton Pillar, MD Referred by: Kelton Pillar, MD  Subjective: Chief Complaint  Patient presents with  . Lower Back - Pain  . Right Hip - Pain   HPI:  Jerome Kelley is a 80 y.o. male who comes in today at the request of Dr. Jean Rosenthal for planned Right L5-S1 Lumbar Transforaminal epidural steroid injection with fluoroscopic guidance.  The patient has failed conservative care including home exercise, medications, time and activity modification.  This injection will be diagnostic and hopefully therapeutic.  Please see requesting physician notes for further details and justification. Prior right L5 transforaminal epidural steroid injection by [facility] imaging in July 2020.   ROS Otherwise per HPI.  Assessment & Plan: Visit Diagnoses:    ICD-10-CM   1. Lumbar radiculopathy  M54.16 XR C-ARM NO REPORT    Epidural Steroid injection    methylPREDNISolone acetate (DEPO-MEDROL) injection 80 mg  2. Foraminal stenosis of lumbar region  M48.061     Plan: No additional findings.   Meds & Orders:  Meds ordered this encounter  Medications  . methylPREDNISolone acetate (DEPO-MEDROL) injection 80 mg    Orders Placed This Encounter  Procedures  . XR C-ARM NO REPORT  . Epidural Steroid injection    Follow-up: Return for visit to requesting physician as needed.   Procedures: No procedures performed  Lumbosacral Transforaminal Epidural Steroid Injection - Sub-Pedicular Approach with Fluoroscopic Guidance  Patient: Jerome Kelley      Date of Birth: [birth year] MRN: [id number] PCP: Kelton Pillar, MD      Visit Date: 12/29/2020   Universal Protocol:    Date/Time: 12/29/2020  Consent Given By: the patient  Position: PRONE  Additional Comments: Vital signs were monitored before and after the procedure. Patient was prepped and draped in the usual sterile  fashion. The correct patient, procedure, and site was verified.   Injection Procedure Details:   Procedure diagnoses: Lumbar radiculopathy [M54.16]    Meds Administered:  Meds ordered this encounter  Medications  . methylPREDNISolone acetate (DEPO-MEDROL) injection 80 mg    Laterality: Right  Location/Site:  L5-S1  Needle:5.0 in., 22 ga.  Short bevel or Quincke spinal needle  Needle Placement: Transforaminal  Findings:    -Comments: Excellent flow of contrast along the nerve, nerve root and into the epidural space.  Procedure Details: After squaring off the end-plates to get a true AP view, the C-arm was positioned so that an oblique view of the foramen as noted above was visualized. The target area is just inferior to the "nose of the scotty dog" or sub pedicular. The soft tissues overlying this structure were infiltrated with 2-3 ml. of 1% Lidocaine without Epinephrine.  The spinal needle was inserted toward the target using a "trajectory" view along the fluoroscope beam.  Under AP and lateral visualization, the needle was advanced so it did not puncture dura and was located close the 6 O'Clock position of the pedical in AP tracterory. Biplanar projections were used to confirm position. Aspiration was confirmed to be negative for CSF and/or blood. A 1-2 ml. volume of Isovue-250 was injected and flow of contrast was noted at each level. Radiographs were obtained for documentation purposes.   After attaining the desired flow of contrast documented above, a 0.5 to 1.0 ml test dose of 0.25% Marcaine was injected into each respective transforaminal space.  The patient was observed  for 90 seconds post injection.  After no sensory deficits were reported, and normal lower extremity motor function was noted,   the above injectate was administered so that equal amounts of the injectate were placed at each foramen (level) into the transforaminal epidural space.   Additional Comments:  The  patient tolerated the procedure well Dressing: 2 x 2 sterile gauze and Band-Aid    Post-procedure details: Patient was observed during the procedure. Post-procedure instructions were reviewed.  Patient left the clinic in stable condition.      Clinical History: MRI LUMBAR SPINE WITHOUT CONTRAST  TECHNIQUE: Multiplanar, multisequence MR imaging of the lumbar spine was performed. No intravenous contrast was administered.  COMPARISON:  01/01/2019 lumbar spine radiographs  FINDINGS: Segmentation:  Standard.  Alignment:  Physiologic.  Vertebrae:  No fracture, evidence of discitis, or bone lesion.  Conus medullaris and cauda equina: Conus extends to the L2 level. Conus and cauda equina appear normal.  Paraspinal and other soft tissues: Negative.  Disc levels:  T11-12: Normal.  T12-L1: Normal.  L1-2: Normal.  L2-3: Disc space narrowing without spinal canal or neural foraminal stenosis. Normal facets.  L3-4: There is disc space narrowing with mild bulge. No central spinal canal stenosis. Mild right foraminal stenosis.  L4-5: Mild disc bulge with normal facets. No spinal canal stenosis. Mild right foraminal stenosis.  L5-S1: Disc space narrowing with mild bulge. No spinal canal stenosis. Moderate bilateral neural foraminal stenosis.  IMPRESSION: 1. Moderate bilateral L5-S1 neural foraminal stenosis. 2. Mild right foraminal stenosis at L3-4 and L4-5.   Electronically Signed   By: Ulyses Jarred M.D.   On: 01/18/2019 05:27     Objective:  VS:  HT:    WT:   BMI:     BP:120/82  HR:85bpm  TEMP: ( )  RESP:  Physical Exam Vitals and nursing note reviewed.  Constitutional:      General: He is not in acute distress.    Appearance: Normal appearance. He is not ill-appearing.     Comments: Thin-appearing  HENT:     Head: Normocephalic and atraumatic.     Right Ear: External ear normal.     Left Ear: External ear normal.     Nose: No  congestion.  Eyes:     Extraocular Movements: Extraocular movements intact.  Cardiovascular:     Rate and Rhythm: Normal rate.     Pulses: Normal pulses.  Pulmonary:     Effort: Pulmonary effort is normal. No respiratory distress.  Abdominal:     General: There is no distension.     Palpations: Abdomen is soft.  Musculoskeletal:        General: No tenderness or signs of injury.     Cervical back: Neck supple.     Right lower leg: No edema.     Left lower leg: No edema.     Comments: Patient has good distal strength without clonus.  Skin:    Findings: No erythema or rash.  Neurological:     General: No focal deficit present.     Mental Status: He is alert and oriented to person, place, and time.     Sensory: No sensory deficit.     Motor: No weakness or abnormal muscle tone.     Coordination: Coordination normal.  Psychiatric:        Mood and Affect: Mood normal.        Behavior: Behavior normal.      Imaging: No results found.

## 2021-01-03 ENCOUNTER — Telehealth: Payer: Self-pay | Admitting: *Deleted

## 2021-01-03 ENCOUNTER — Other Ambulatory Visit: Payer: Self-pay

## 2021-01-03 ENCOUNTER — Inpatient Hospital Stay: Payer: Medicare Other

## 2021-01-03 ENCOUNTER — Other Ambulatory Visit: Payer: Self-pay | Admitting: Family

## 2021-01-03 ENCOUNTER — Other Ambulatory Visit: Payer: Self-pay | Admitting: *Deleted

## 2021-01-03 ENCOUNTER — Inpatient Hospital Stay: Payer: Medicare Other | Admitting: Family

## 2021-01-03 VITALS — BP 126/45 | HR 72 | Temp 98.2°F | Resp 17

## 2021-01-03 DIAGNOSIS — C342 Malignant neoplasm of middle lobe, bronchus or lung: Secondary | ICD-10-CM | POA: Diagnosis not present

## 2021-01-03 DIAGNOSIS — C3490 Malignant neoplasm of unspecified part of unspecified bronchus or lung: Secondary | ICD-10-CM

## 2021-01-03 DIAGNOSIS — Z5111 Encounter for antineoplastic chemotherapy: Secondary | ICD-10-CM | POA: Diagnosis not present

## 2021-01-03 DIAGNOSIS — D508 Other iron deficiency anemias: Secondary | ICD-10-CM

## 2021-01-03 DIAGNOSIS — D649 Anemia, unspecified: Secondary | ICD-10-CM

## 2021-01-03 DIAGNOSIS — R5383 Other fatigue: Secondary | ICD-10-CM | POA: Diagnosis not present

## 2021-01-03 DIAGNOSIS — Z79899 Other long term (current) drug therapy: Secondary | ICD-10-CM | POA: Diagnosis not present

## 2021-01-03 LAB — CMP (CANCER CENTER ONLY)
ALT: 16 U/L (ref 0–44)
AST: 17 U/L (ref 15–41)
Albumin: 2.7 g/dL — ABNORMAL LOW (ref 3.5–5.0)
Alkaline Phosphatase: 72 U/L (ref 38–126)
Anion gap: 7 (ref 5–15)
BUN: 22 mg/dL (ref 8–23)
CO2: 26 mmol/L (ref 22–32)
Calcium: 8.2 mg/dL — ABNORMAL LOW (ref 8.9–10.3)
Chloride: 104 mmol/L (ref 98–111)
Creatinine: 0.72 mg/dL (ref 0.61–1.24)
GFR, Estimated: >60 mL/min (ref >60)
Glucose, Bld: 105 mg/dL — ABNORMAL HIGH (ref 70–99)
Potassium: 3.3 mmol/L — ABNORMAL LOW (ref 3.5–5.1)
Sodium: 137 mmol/L (ref 135–145)
Total Bilirubin: 1 mg/dL (ref 0.3–1.2)
Total Protein: 5.5 g/dL — ABNORMAL LOW (ref 6.5–8.1)

## 2021-01-03 LAB — CBC WITH DIFFERENTIAL (CANCER CENTER ONLY)
Abs Immature Granulocytes: 0.04 10*3/uL (ref 0.00–0.07)
Basophils Absolute: 0 10*3/uL (ref 0.0–0.1)
Basophils Relative: 0 %
Eosinophils Absolute: 0.1 10*3/uL (ref 0.0–0.5)
Eosinophils Relative: 6 %
HCT: 21.5 % — ABNORMAL LOW (ref 39.0–52.0)
Hemoglobin: 6.9 g/dL — CL (ref 13.0–17.0)
Immature Granulocytes: 2 %
Lymphocytes Relative: 23 %
Lymphs Abs: 0.6 10*3/uL — ABNORMAL LOW (ref 0.7–4.0)
MCH: 31.7 pg (ref 26.0–34.0)
MCHC: 32.1 g/dL (ref 30.0–36.0)
MCV: 98.6 fL (ref 80.0–100.0)
Monocytes Absolute: 0.1 10*3/uL (ref 0.1–1.0)
Monocytes Relative: 5 %
Neutro Abs: 1.6 10*3/uL — ABNORMAL LOW (ref 1.7–7.7)
Neutrophils Relative %: 64 %
Platelet Count: 144 10*3/uL — ABNORMAL LOW (ref 150–400)
RBC: 2.18 MIL/uL — ABNORMAL LOW (ref 4.22–5.81)
RDW: 18.2 % — ABNORMAL HIGH (ref 11.5–15.5)
WBC Count: 2.4 10*3/uL — ABNORMAL LOW (ref 4.0–10.5)
nRBC: 0 % (ref 0.0–0.2)

## 2021-01-03 LAB — PREPARE RBC (CROSSMATCH)

## 2021-01-03 LAB — SAMPLE TO BLOOD BANK

## 2021-01-03 MED ORDER — SODIUM CHLORIDE 0.9 % IV SOLN
Freq: Once | INTRAVENOUS | Status: AC
  Filled 2021-01-03: qty 250

## 2021-01-03 MED ORDER — HEPARIN SOD (PORK) LOCK FLUSH 100 UNIT/ML IV SOLN
500.0000 [IU] | Freq: Once | INTRAVENOUS | Status: AC
  Administered 2021-01-03: 500 [IU] via INTRAVENOUS
  Filled 2021-01-03: qty 5

## 2021-01-03 MED ORDER — SODIUM CHLORIDE 0.9% FLUSH
10.0000 mL | Freq: Once | INTRAVENOUS | Status: AC
  Administered 2021-01-03: 10 mL via INTRAVENOUS
  Filled 2021-01-03: qty 10

## 2021-01-03 NOTE — Patient Instructions (Signed)
Dehydration, Adult Dehydration is condition in which there is not enough water or other fluids in the body. This happens when a person loses more fluids than he or she takes in. Important body parts cannot work right without the right amount of fluids. Any loss of fluids from the body can cause dehydration. Dehydration can be mild, worse, or very bad. It should be treated right away to keep it from getting very bad. What are the causes? This condition may be caused by:  Conditions that cause loss of water or other fluids, such as: ? Watery poop (diarrhea). ? Vomiting. ? Sweating a lot. ? Peeing (urinating) a lot.  Not drinking enough fluids, especially when you: ? Are ill. ? Are doing things that take a lot of energy to do.  Other illnesses and conditions, such as fever or infection.  Certain medicines, such as medicines that take extra fluid out of the body (diuretics).  Lack of safe drinking water.  Not being able to get enough water and food. What increases the risk? The following factors may make you more likely to develop this condition:  Having a long-term (chronic) illness that has not been treated the right way, such as: ? Diabetes. ? Heart disease. ? Kidney disease.  Being 65 years of age or older.  Having a disability.  Living in a place that is high above the ground or sea (high in altitude). The thinner, dried air causes more fluid loss.  Doing exercises that put stress on your body for a long time. What are the signs or symptoms? Symptoms of dehydration depend on how bad it is. Mild or worse dehydration  Thirst.  Dry lips or dry mouth.  Feeling dizzy or light-headed, especially when you stand up from sitting.  Muscle cramps.  Your body making: ? Dark pee (urine). Pee may be the color of tea. ? Less pee than normal. ? Less tears than normal.  Headache. Very bad dehydration  Changes in skin. Skin may: ? Be cold to the touch (clammy). ? Be blotchy  or pale. ? Not go back to normal right after you lightly pinch it and let it go.  Little or no tears, pee, or sweat.  Changes in vital signs, such as: ? Fast breathing. ? Low blood pressure. ? Weak pulse. ? Pulse that is more than 100 beats a minute when you are sitting still.  Other changes, such as: ? Feeling very thirsty. ? Eyes that look hollow (sunken). ? Cold hands and feet. ? Being mixed up (confused). ? Being very tired (lethargic) or having trouble waking from sleep. ? Short-term weight loss. ? Loss of consciousness. How is this treated? Treatment for this condition depends on how bad it is. Treatment should start right away. Do not wait until your condition gets very bad. Very bad dehydration is an emergency. You will need to go to a hospital.  Mild or worse dehydration can be treated at home. You may be asked to: ? Drink more fluids. ? Drink an oral rehydration solution (ORS). This drink helps get the right amounts of fluids and salts and minerals in the blood (electrolytes).  Very bad dehydration can be treated: ? With fluids through an IV tube. ? By getting normal levels of salts and minerals in your blood. This is often done by giving salts and minerals through a tube. The tube is passed through your nose and into your stomach. ? By treating the root cause. Follow these instructions at   home: Oral rehydration solution If told by your doctor, drink an ORS:  Make an ORS. Use instructions on the package.  Start by drinking small amounts, about  cup (120 mL) every 5-10 minutes.  Slowly drink more until you have had the amount that your doctor said to have. Eating and drinking  Drink enough clear fluid to keep your pee pale yellow. If you were told to drink an ORS, finish the ORS first. Then, start slowly drinking other clear fluids. Drink fluids such as: ? Water. Do not drink only water. Doing that can make the salt (sodium) level in your body get too low. ? Water  from ice chips you suck on. ? Fruit juice that you have added water to (diluted). ? Low-calorie sports drinks.  Eat foods that have the right amounts of salts and minerals, such as: ? Bananas. ? Oranges. ? Potatoes. ? Tomatoes. ? Spinach.  Do not drink alcohol.  Avoid: ? Drinks that have a lot of sugar. These include:  High-calorie sports drinks.  Fruit juice that you did not add water to.  Soda.  Caffeine. ? Foods that are greasy or have a lot of fat or sugar.         General instructions  Take over-the-counter and prescription medicines only as told by your doctor.  Do not take salt tablets. Doing that can make the salt level in your body get too high.  Return to your normal activities as told by your doctor. Ask your doctor what activities are safe for you.  Keep all follow-up visits as told by your doctor. This is important. Contact a doctor if:  You have pain in your belly (abdomen) and the pain: ? Gets worse. ? Stays in one place.  You have a rash.  You have a stiff neck.  You get angry or annoyed (irritable) more easily than normal.  You are more tired or have a harder time waking than normal.  You feel: ? Weak or dizzy. ? Very thirsty. Get help right away if you have:  Any symptoms of very bad dehydration.  Symptoms of vomiting, such as: ? You cannot eat or drink without vomiting. ? Your vomiting gets worse or does not go away. ? Your vomit has blood or green stuff in it.  Symptoms that get worse with treatment.  A fever.  A very bad headache.  Problems with peeing or pooping (having a bowel movement), such as: ? Watery poop that gets worse or does not go away. ? Blood in your poop (stool). This may cause poop to look black and tarry. ? Not peeing in 6-8 hours. ? Peeing only a small amount of very dark pee in 6-8 hours.  Trouble breathing. These symptoms may be an emergency. Do not wait to see if the symptoms will go away. Get  medical help right away. Call your local emergency services (911 in the [place] Do not drive yourself to the hospital. Summary  Dehydration is a condition in which there is not enough water or other fluids in the body. This happens when a person loses more fluids than he or she takes in.  Treatment for this condition depends on how bad it is. Treatment should be started right away. Do not wait until your condition gets very bad.  Drink enough clear fluid to keep your pee pale yellow. If you were told to drink an oral rehydration solution (ORS), finish the ORS first. Then, start slowly drinking other clear fluids.    Take over-the-counter and prescription medicines only as told by your doctor.  Get help right away if you have any symptoms of very bad dehydration. This information is not intended to replace advice given to you by your health care provider. Make sure you discuss any questions you have with your health care provider. Document Revised: 03/13/2019 Document Reviewed: 03/13/2019 Elsevier Patient Education  2021 [employer]  

## 2021-01-03 NOTE — Telephone Encounter (Signed)
Hgb 6.9 reported by Richardson Landry in lab.  2 u prbc ordered for tomorrow.

## 2021-01-03 NOTE — Progress Notes (Signed)
Jerome Kelley came in today for lab work and fluids for weakness/fatigue, unable to stand for long due to back pain, decreased appetite and fluid intake.  Hgb is down to 6.9, MCV 98, WBC count 2.4 and platelets 144.  Hemoccult testing done here in office with Erline Levine, RN as chaperone. Result negative.  Patient will come back tomorrow for two units of blood.

## 2021-01-03 NOTE — Patient Instructions (Signed)
Implanted Port Insertion, Care After This sheet gives you information about how to care for yourself after your procedure. Your health care provider may also give you more specific instructions. If you have problems or questions, contact your health care provider. What can I expect after the procedure? After the procedure, it is common to have:  Discomfort at the port insertion site.  Bruising on the skin over the port. This should improve over 3-4 days. Follow these instructions at home: Port care  After your port is placed, you will get a manufacturer's information card. The card has information about your port. Keep this card with you at all times.  Take care of the port as told by your health care provider. Ask your health care provider if you or a family member can get training for taking care of the port at home. A home health care nurse may also take care of the port.  Make sure to remember what type of port you have. Incision care  Follow instructions from your health care provider about how to take care of your port insertion site. Make sure you: ? Wash your hands with soap and water before and after you change your bandage (dressing). If soap and water are not available, use hand sanitizer. ? Change your dressing as told by your health care provider. ? Leave stitches (sutures), skin glue, or adhesive strips in place. These skin closures may need to stay in place for 2 weeks or longer. If adhesive strip edges start to loosen and curl up, you may trim the loose edges. Do not remove adhesive strips completely unless your health care provider tells you to do that.  Check your port insertion site every day for signs of infection. Check for: ? Redness, swelling, or pain. ? Fluid or blood. ? Warmth. ? Pus or a bad smell.      Activity  Return to your normal activities as told by your health care provider. Ask your health care provider what activities are safe for you.  Do not  lift anything that is heavier than 10 lb (4.5 kg), or the limit that you are told, until your health care provider says that it is safe. General instructions  Take over-the-counter and prescription medicines only as told by your health care provider.  Do not take baths, swim, or use a hot tub until your health care provider approves. Ask your health care provider if you may take showers. You may only be allowed to take sponge baths.  Do not drive for 24 hours if you were given a sedative during your procedure.  Wear a medical alert bracelet in case of an emergency. This will tell any health care providers that you have a port.  Keep all follow-up visits as told by your health care provider. This is important. Contact a health care provider if:  You cannot flush your port with saline as directed, or you cannot draw blood from the port.  You have a fever or chills.  You have redness, swelling, or pain around your port insertion site.  You have fluid or blood coming from your port insertion site.  Your port insertion site feels warm to the touch.  You have pus or a bad smell coming from the port insertion site. Get help right away if:  You have chest pain or shortness of breath.  You have bleeding from your port that you cannot control. Summary  Take care of the port as told by your   health care provider. Keep the manufacturer's information card with you at all times.  Change your dressing as told by your health care provider.  Contact a health care provider if you have a fever or chills or if you have redness, swelling, or pain around your port insertion site.  Keep all follow-up visits as told by your health care provider. This information is not intended to replace advice given to you by your health care provider. Make sure you discuss any questions you have with your health care provider. Document Revised: 02/26/2018 Document Reviewed: 02/26/2018 Elsevier Patient Education   2021 Elsevier Inc.  

## 2021-01-03 NOTE — Telephone Encounter (Signed)
Received call from wife stating,"Wise got an XRay and injection last week. However, he is still having trouble standing up to shave. He has a decreased appetite, not drinking enough fluids and has weakness. Per Dr. Marin Olp, bring him in today for IVF. Wife verbalized understanding.

## 2021-01-04 ENCOUNTER — Inpatient Hospital Stay: Payer: Medicare Other

## 2021-01-04 DIAGNOSIS — Z5111 Encounter for antineoplastic chemotherapy: Secondary | ICD-10-CM | POA: Diagnosis not present

## 2021-01-04 DIAGNOSIS — C3490 Malignant neoplasm of unspecified part of unspecified bronchus or lung: Secondary | ICD-10-CM

## 2021-01-04 DIAGNOSIS — D649 Anemia, unspecified: Secondary | ICD-10-CM

## 2021-01-04 DIAGNOSIS — Z79899 Other long term (current) drug therapy: Secondary | ICD-10-CM | POA: Diagnosis not present

## 2021-01-04 DIAGNOSIS — C342 Malignant neoplasm of middle lobe, bronchus or lung: Secondary | ICD-10-CM | POA: Diagnosis not present

## 2021-01-04 DIAGNOSIS — R5383 Other fatigue: Secondary | ICD-10-CM | POA: Diagnosis not present

## 2021-01-04 MED ORDER — HEPARIN SOD (PORK) LOCK FLUSH 100 UNIT/ML IV SOLN
500.0000 [IU] | Freq: Every day | INTRAVENOUS | Status: AC | PRN
Start: 2021-01-04 — End: 2021-01-04
  Administered 2021-01-04: 500 [IU]
  Filled 2021-01-04: qty 5

## 2021-01-04 MED ORDER — ACETAMINOPHEN 325 MG PO TABS: 650.0000 mg | ORAL_TABLET | Freq: Once | ORAL | Status: DC

## 2021-01-04 MED ORDER — SODIUM CHLORIDE 0.9% FLUSH
10.0000 mL | INTRAVENOUS | Status: AC | PRN
Start: 2021-01-04 — End: 2021-01-04
  Administered 2021-01-04: 10 mL
  Filled 2021-01-04: qty 10

## 2021-01-04 MED ORDER — SODIUM CHLORIDE 0.9% IV SOLUTION
250.0000 mL | Freq: Once | INTRAVENOUS | Status: AC
  Administered 2021-01-04: 250 mL via INTRAVENOUS
  Filled 2021-01-04: qty 250

## 2021-01-04 MED ORDER — DIPHENHYDRAMINE HCL 25 MG PO CAPS: 25.0000 mg | ORAL_CAPSULE | Freq: Once | ORAL | Status: DC

## 2021-01-04 NOTE — Patient Instructions (Signed)

## 2021-01-05 LAB — BPAM RBC
Blood Product Expiration Date: 202206232359
Blood Product Expiration Date: 202206232359
ISSUE DATE / TIME: 202205240751
ISSUE DATE / TIME: 202205240751
Unit Type and Rh: 5100
Unit Type and Rh: 5100

## 2021-01-05 LAB — TYPE AND SCREEN
ABO/RH(D): O POS
Antibody Screen: NEGATIVE
Unit division: 0
Unit division: 0

## 2021-01-07 ENCOUNTER — Encounter: Payer: Self-pay | Admitting: Radiology

## 2021-01-18 NOTE — Progress Notes (Incomplete)
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 He denied any pain, cough or swallowing issues and reports having a good appetite.    Skin was intact upon physical exam and his weight remains stable.   Plan: The patient will follow-up with radiation oncology in one month .  ________________________________________________ -----------------------------------  Blair Promise, PhD, MD  This document serves as a record of services personally performed by Gery Pray, MD. It was created on his behalf by Roney Mans, a trained medical scribe. The creation of this record is based on the scribe's personal observations and the provider's statements to them. This document has been checked and approved by the attending provider.

## 2021-01-18 NOTE — Progress Notes (Signed)
Radiation Oncology         [phone] ________________________________  Name: Jerome Kelley MRN: [id number]  Date: 01/20/2021  DOB: February 07, 1941  Follow-Up Visit Note  CC: Jerome Pillar, MD  Jerome Napoleon, MD   Diagnosis:   The encounter diagnosis was Malignant neoplasm of upper lobe of right lung [facility]). 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 Since his initial consultation, he followed up with Jerome Kelley on 11/15/2020 in which his radiation and low-dose chemotherapy treatments were discussed. He was noted to respond well to both treatments however his blood count was trending downward. Jerome Kelley decided it was best to give him a break from his chemo treatment the following week.   The patient then met with Jerome Kelley on 12/06/20, in which it was decided upon discussion with Dr. Marin Kelley to hold his chemotherapy treatment again, this time for two weeks; due to a drop in his Hgb and platelet count. The patient resumed his normal chemo infusions on 12/20/20.    The patient completed his radiation interval on 05/04/41m and was noted to tolerate treatment very well.                      Allergies:  has No Known Allergies.  Meds: Current Outpatient Medications  Medication Sig Dispense Refill   aspirin 81 MG chewable tablet Chew 1 tablet (81 mg total) by mouth 2 (two) times daily. (Patient taking differently: Chew 81 mg by mouth daily.) 35 tablet 0   benazepril (LOTENSIN) 20 MG tablet Take 20 mg by mouth daily.     folic acid (FOLVITE) 1 MG tablet Take 1 mg by mouth daily.     vitamin B-12 (CYANOCOBALAMIN) 1000 MCG tablet Take 1 tablet  (1,000 mcg total) by mouth daily. 30 tablet 0   dexamethasone (DECADRON) 4 MG tablet 1 TAB 2 TIMES A DAY THE DAY BEFORE ALIMTA CHEMO, THEN 2 TABS ONCE A DAY FOR 3 DAYS STARTING THE DAY AFTER CISPLATIN. (Patient not taking: Reported on 01/20/2021) 30 tablet 1   dexamethasone (DECADRON) 4 MG tablet Take 2 tablets (8 mg total) by mouth daily. Start the day after chemotherapy for 2 days. (Patient not taking: Reported on 01/20/2021) 30 tablet 1   lidocaine-prilocaine (EMLA) cream Apply to affected area once (Patient not taking: Reported on 01/20/2021) 30 g 3   LORazepam (ATIVAN) 0.5 MG tablet Take one tablet (0.5 mg) every six hours as needed for nausea (Patient not taking: Reported on 01/20/2021) 30 tablet 0   ondansetron (ZOFRAN) 8 MG tablet Take 1 tablet (8 mg total) by mouth 2 (two) times daily as needed for refractory nausea / vomiting. Start on day 3 after chemo. (Patient not taking: Reported on 01/20/2021) 30 tablet 1   prochlorperazine (COMPAZINE) 10 MG tablet Take 1 tablet (10 mg total) by mouth every 6 (six) hours as needed (Nausea or vomiting). (Patient not taking: Reported on 01/20/2021) 30 tablet 1   sucralfate (CARAFATE) 1 g tablet Take 1 tablet (1 g total) by mouth 4 (four) times daily -  with meals and at bedtime. Crush and dissolve in 10 mL of  warm water prior to swallowing (Patient not taking: Reported on 01/20/2021) 120 tablet 1   No current facility-administered medications for this encounter.    Physical Findings: The patient is in no acute distress. Patient is alert and oriented.  height is '5\' 8"'  (1.727 m) and weight is 145 lb (65.8 kg). His temporal temperature is 96.8 F (36 C) (abnormal). His blood pressure is 103/47 (abnormal) and his pulse is 76. His respiration is 20 and oxygen saturation is 98%. .  Lungs are clear to auscultation bilaterally. Heart has regular rate and rhythm. No palpable cervical, supraclavicular, or axillary adenopathy. Abdomen soft, non-tender, normal bowel  sounds.     Lab Findings: Lab Results  Component Value Date   WBC 2.4 (L) 01/03/2021   HGB 6.9 (LL) 01/03/2021   HCT 21.5 (L) 01/03/2021   MCV 98.6 01/03/2021   PLT 144 (L) 01/03/2021    Radiographic Findings: Epidural Steroid injection  Result Date: 12/29/2020 Magnus Sinning, MD     12/29/2020  4:20 PM Lumbosacral Transforaminal Epidural Steroid Injection - Sub-Pedicular Approach with Fluoroscopic Guidance Patient: Jerome Kelley     Date of Birth: August 29, 1940 MRN: [id number] PCP: Jerome Pillar, MD     Visit Date: 12/29/2020  Universal Protocol:   Date/Time: 12/29/2020 Consent Given By: the patient Position: PRONE Additional Comments: Vital signs were monitored before and after the procedure. Patient was prepped and draped in the usual sterile fashion. The correct patient, procedure, and site was verified. Injection Procedure Details: Procedure diagnoses: Lumbar radiculopathy [M54.16]  Meds Administered: Meds ordered this encounter Medications  methylPREDNISolone acetate (DEPO-MEDROL) injection 80 mg Laterality: Right Location/Site: L5-S1 Needle:5.0 in., 22 ga.  Short bevel or Quincke spinal needle Needle Placement: Transforaminal Findings:   -Comments: Excellent flow of contrast along the nerve, nerve root and into the epidural space. Procedure Details: After squaring off the end-plates to get a true AP view, the C-arm was positioned so that an oblique view of the foramen as noted above was visualized. The target area is just inferior to the "nose of the scotty dog" or sub pedicular. The soft tissues overlying this structure were infiltrated with 2-3 ml. of 1% Lidocaine without Epinephrine. The spinal needle was inserted toward the target using a "trajectory" view along the fluoroscope beam.  Under AP and lateral visualization, the needle was advanced so it did not puncture dura and was located close the 6 O'Clock position of the pedical in AP tracterory. Biplanar projections were used to confirm  position. Aspiration was confirmed to be negative for CSF and/or blood. A 1-2 ml. volume of Isovue-250 was injected and flow of contrast was noted at each level. Radiographs were obtained for documentation purposes. After attaining the desired flow of contrast documented above, a 0.5 to 1.0 ml test dose of 0.25% Marcaine was injected into each respective transforaminal space.  The patient was observed for 90 seconds post injection.  After no sensory deficits were reported, and normal lower extremity motor function was noted,   the above injectate was administered so that equal amounts of the injectate were placed at each foramen (level) into the transforaminal epidural space. Additional Comments: The patient tolerated the procedure well Dressing: 2 x 2 sterile gauze and Band-Aid  Post-procedure details: Patient was observed during the procedure. Post-procedure instructions were reviewed. Patient left the clinic in stable condition.   XR C-ARM NO REPORT  Result Date: 12/29/2020 Please see Notes tab for imaging impression.    Impression:   The encounter diagnosis  was Malignant neoplasm of upper lobe of right lung ([place]). 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  He continues have some fatigue but pretty much back to his usual baseline energy level.  I recommended he get up and walk a little bit more as his wife says he does not get out of his chair very often.  He denies any breathing problems or cough or pain within the chest area.    Plan: As needed follow-up in radiation oncology.  Patient will be seeing Dr. Marin Kelley later this month and at some point will undergo repeat PET scan approximately 3 months out from his radiation therapy to assess his response to treatment.    ____________________________________  Blair Promise, PhD, MD   This document serves as a record of  services personally performed by Gery Pray, MD. It was created on his behalf by Roney Mans, a trained medical scribe. The creation of this record is based on the scribe's personal observations and the provider's statements to them. This document has been checked and approved by the attending provider.

## 2021-01-20 ENCOUNTER — Ambulatory Visit
Admission: RE | Admit: 2021-01-20 | Discharge: 2021-01-20 | Disposition: A | Discharge status: Home / Self Care | Payer: Medicare Other | Source: Ambulatory Visit | Attending: Radiation Oncology | Admitting: Radiation Oncology

## 2021-01-20 ENCOUNTER — Encounter: Payer: Self-pay | Admitting: Radiation Oncology

## 2021-01-20 ENCOUNTER — Other Ambulatory Visit: Payer: Self-pay

## 2021-01-20 VITALS — BP 103/47 | HR 76 | Temp 96.8°F | Resp 20 | Ht 68.0 in | Wt 145.0 lb

## 2021-01-20 DIAGNOSIS — C342 Malignant neoplasm of middle lobe, bronchus or lung: Secondary | ICD-10-CM | POA: Insufficient documentation

## 2021-01-20 DIAGNOSIS — Z923 Personal history of irradiation: Secondary | ICD-10-CM | POA: Insufficient documentation

## 2021-01-20 DIAGNOSIS — Z79899 Other long term (current) drug therapy: Secondary | ICD-10-CM | POA: Diagnosis not present

## 2021-01-20 DIAGNOSIS — Z7982 Long term (current) use of aspirin: Secondary | ICD-10-CM | POA: Diagnosis not present

## 2021-01-20 DIAGNOSIS — C3411 Malignant neoplasm of upper lobe, right bronchus or lung: Secondary | ICD-10-CM

## 2021-01-20 DIAGNOSIS — Z7952 Long term (current) use of systemic steroids: Secondary | ICD-10-CM | POA: Insufficient documentation

## 2021-01-20 NOTE — Progress Notes (Signed)
ZAUL HUBERS is here today for follow up post radiation to the lung.  Lung Side: right Treatments to mediastinal and right hilar region  Does the patient complain of any of the following: Pain:denies Shortness of breath w/wo exertion: shortness of breat with exertion Cough: mild cough in mornings Hemoptysis: denies Pain with swallowing: denies Swallowing/choking concerns: denies Appetite: good, is improving Energy Level: poor Post radiation skin Changes: none noted    Additional comments if applicable: none  Vitals:   01/20/21 1040  BP: (!) 103/47  Pulse: 76  Resp: 20  Temp: (!) 96.8 F (36 C)  TempSrc: Temporal  SpO2: 98%  Weight: 145 lb (65.8 kg)  Height: 5\' 8"  (1.727 m)

## 2021-01-21 DIAGNOSIS — J449 Chronic obstructive pulmonary disease, unspecified: Secondary | ICD-10-CM | POA: Diagnosis not present

## 2021-01-21 DIAGNOSIS — Z0000 Encounter for general adult medical examination without abnormal findings: Secondary | ICD-10-CM | POA: Diagnosis not present

## 2021-01-21 DIAGNOSIS — Z23 Encounter for immunization: Secondary | ICD-10-CM | POA: Diagnosis not present

## 2021-01-21 DIAGNOSIS — R972 Elevated prostate specific antigen [PSA]: Secondary | ICD-10-CM | POA: Diagnosis not present

## 2021-01-21 DIAGNOSIS — C3490 Malignant neoplasm of unspecified part of unspecified bronchus or lung: Secondary | ICD-10-CM | POA: Diagnosis not present

## 2021-01-21 DIAGNOSIS — I10 Essential (primary) hypertension: Secondary | ICD-10-CM | POA: Diagnosis not present

## 2021-01-21 DIAGNOSIS — Z1389 Encounter for screening for other disorder: Secondary | ICD-10-CM | POA: Diagnosis not present

## 2021-01-21 DIAGNOSIS — Z85038 Personal history of other malignant neoplasm of large intestine: Secondary | ICD-10-CM | POA: Diagnosis not present

## 2021-01-24 ENCOUNTER — Inpatient Hospital Stay: Payer: Medicare Other

## 2021-01-24 ENCOUNTER — Encounter: Payer: Self-pay | Admitting: Hematology & Oncology

## 2021-01-24 ENCOUNTER — Inpatient Hospital Stay: Payer: Medicare Other | Attending: Hematology & Oncology

## 2021-01-24 ENCOUNTER — Inpatient Hospital Stay (HOSPITAL_BASED_OUTPATIENT_CLINIC_OR_DEPARTMENT_OTHER): Payer: Medicare Other | Admitting: Hematology & Oncology

## 2021-01-24 ENCOUNTER — Other Ambulatory Visit: Payer: Self-pay

## 2021-01-24 VITALS — BP 90/45 | HR 81 | Temp 97.9°F | Resp 18 | Wt 146.0 lb

## 2021-01-24 VITALS — BP 132/51 | HR 61 | Temp 97.5°F | Resp 18

## 2021-01-24 DIAGNOSIS — D630 Anemia in neoplastic disease: Secondary | ICD-10-CM | POA: Insufficient documentation

## 2021-01-24 DIAGNOSIS — J449 Chronic obstructive pulmonary disease, unspecified: Secondary | ICD-10-CM | POA: Diagnosis not present

## 2021-01-24 DIAGNOSIS — R29898 Other symptoms and signs involving the musculoskeletal system: Secondary | ICD-10-CM | POA: Diagnosis not present

## 2021-01-24 DIAGNOSIS — C342 Malignant neoplasm of middle lobe, bronchus or lung: Secondary | ICD-10-CM | POA: Insufficient documentation

## 2021-01-24 DIAGNOSIS — Z95828 Presence of other vascular implants and grafts: Secondary | ICD-10-CM

## 2021-01-24 DIAGNOSIS — Z79899 Other long term (current) drug therapy: Secondary | ICD-10-CM | POA: Insufficient documentation

## 2021-01-24 DIAGNOSIS — C3411 Malignant neoplasm of upper lobe, right bronchus or lung: Secondary | ICD-10-CM

## 2021-01-24 DIAGNOSIS — Z7982 Long term (current) use of aspirin: Secondary | ICD-10-CM | POA: Insufficient documentation

## 2021-01-24 DIAGNOSIS — D508 Other iron deficiency anemias: Secondary | ICD-10-CM

## 2021-01-24 LAB — CMP (CANCER CENTER ONLY)
ALT: 5 U/L (ref 0–44)
AST: 9 U/L — ABNORMAL LOW (ref 15–41)
Albumin: 2.9 g/dL — ABNORMAL LOW (ref 3.5–5.0)
Alkaline Phosphatase: 102 U/L (ref 38–126)
Anion gap: 5 (ref 5–15)
BUN: 13 mg/dL (ref 8–23)
CO2: 29 mmol/L (ref 22–32)
Calcium: 8.8 mg/dL — ABNORMAL LOW (ref 8.9–10.3)
Chloride: 106 mmol/L (ref 98–111)
Creatinine: 0.71 mg/dL (ref 0.61–1.24)
GFR, Estimated: >60 mL/min (ref >60)
Glucose, Bld: 121 mg/dL — ABNORMAL HIGH (ref 70–99)
Potassium: 3.6 mmol/L (ref 3.5–5.1)
Sodium: 140 mmol/L (ref 135–145)
Total Bilirubin: 0.6 mg/dL (ref 0.3–1.2)
Total Protein: 5.4 g/dL — ABNORMAL LOW (ref 6.5–8.1)

## 2021-01-24 LAB — IRON AND TIBC
Iron: 59 ug/dL (ref 42–163)
Saturation Ratios: 33 % (ref 20–55)
TIBC: 180 ug/dL — ABNORMAL LOW (ref 202–409)
UIBC: 121 ug/dL (ref 117–376)

## 2021-01-24 LAB — CBC WITH DIFFERENTIAL (CANCER CENTER ONLY)
Abs Immature Granulocytes: 0.04 10*3/uL (ref 0.00–0.07)
Basophils Absolute: 0 10*3/uL (ref 0.0–0.1)
Basophils Relative: 0 %
Eosinophils Absolute: 0.1 10*3/uL (ref 0.0–0.5)
Eosinophils Relative: 2 %
HCT: 29.9 % — ABNORMAL LOW (ref 39.0–52.0)
Hemoglobin: 9.5 g/dL — ABNORMAL LOW (ref 13.0–17.0)
Immature Granulocytes: 1 %
Lymphocytes Relative: 25 %
Lymphs Abs: 1 10*3/uL (ref 0.7–4.0)
MCH: 31.9 pg (ref 26.0–34.0)
MCHC: 31.8 g/dL (ref 30.0–36.0)
MCV: 100.3 fL — ABNORMAL HIGH (ref 80.0–100.0)
Monocytes Absolute: 0.4 10*3/uL (ref 0.1–1.0)
Monocytes Relative: 10 %
Neutro Abs: 2.4 10*3/uL (ref 1.7–7.7)
Neutrophils Relative %: 62 %
Platelet Count: 98 10*3/uL — ABNORMAL LOW (ref 150–400)
RBC: 2.98 MIL/uL — ABNORMAL LOW (ref 4.22–5.81)
RDW: 18.2 % — ABNORMAL HIGH (ref 11.5–15.5)
WBC Count: 3.9 10*3/uL — ABNORMAL LOW (ref 4.0–10.5)
nRBC: 0 % (ref 0.0–0.2)

## 2021-01-24 LAB — SAMPLE TO BLOOD BANK

## 2021-01-24 LAB — FERRITIN: Ferritin: 577 ng/mL — ABNORMAL HIGH (ref 24–336)

## 2021-01-24 LAB — PREPARE RBC (CROSSMATCH)

## 2021-01-24 LAB — LACTATE DEHYDROGENASE: LDH: 163 U/L (ref 98–192)

## 2021-01-24 MED ORDER — SODIUM CHLORIDE 0.9% IV SOLUTION
250.0000 mL | Freq: Once | INTRAVENOUS | Status: AC
Start: 2021-01-24 — End: 2021-01-24
  Filled 2021-01-24: qty 250

## 2021-01-24 MED ORDER — DIPHENHYDRAMINE HCL 25 MG PO CAPS
ORAL_CAPSULE | ORAL | Status: AC
  Filled 2021-01-24: qty 1

## 2021-01-24 MED ORDER — SODIUM CHLORIDE 0.9% FLUSH
10.0000 mL | Freq: Once | INTRAVENOUS | Status: DC
  Filled 2021-01-24: qty 10

## 2021-01-24 MED ORDER — HEPARIN SOD (PORK) LOCK FLUSH 100 UNIT/ML IV SOLN
250.0000 [IU] | INTRAVENOUS | Status: DC | PRN
  Filled 2021-01-24: qty 5

## 2021-01-24 MED ORDER — SODIUM CHLORIDE 0.9% FLUSH
10.0000 mL | INTRAVENOUS | Status: AC | PRN
  Administered 2021-01-24: 10 mL
  Filled 2021-01-24: qty 10

## 2021-01-24 MED ORDER — ACETAMINOPHEN 325 MG PO TABS
ORAL_TABLET | ORAL | Status: AC
  Filled 2021-01-24: qty 2

## 2021-01-24 MED ORDER — ACETAMINOPHEN 325 MG PO TABS
650.0000 mg | ORAL_TABLET | Freq: Once | ORAL | Status: AC
  Administered 2021-01-24: 650 mg via ORAL

## 2021-01-24 MED ORDER — HEPARIN SOD (PORK) LOCK FLUSH 100 UNIT/ML IV SOLN
500.0000 [IU] | Freq: Once | INTRAVENOUS | Status: AC
  Administered 2021-01-24: 500 [IU] via INTRAVENOUS
  Filled 2021-01-24: qty 5

## 2021-01-24 MED ORDER — SODIUM CHLORIDE 0.9 % IV SOLN
INTRAVENOUS | Status: AC
Start: 2021-01-24 — End: 2021-01-24
  Filled 2021-01-24 (×2): qty 250

## 2021-01-24 MED ORDER — DIPHENHYDRAMINE HCL 25 MG PO CAPS
25.0000 mg | ORAL_CAPSULE | Freq: Once | ORAL | Status: AC
  Administered 2021-01-24: 25 mg via ORAL

## 2021-01-24 NOTE — Patient Instructions (Signed)
Blood Transfusion, Adult, Care After This sheet gives you information about how to care for yourself after your procedure. Your doctor may also give you more specific instructions. If youhave problems or questions, contact your doctor. What can I expect after the procedure? After the procedure, it is common to have: Bruising and soreness at the IV site. A fever or chills on the day of the procedure. This may be your body's response to the new blood cells received. A headache. Follow these instructions at home: Insertion site care     Follow instructions from your doctor about how to take care of your insertion site. This is where an IV tube was put into your vein. Make sure you: Wash your hands with soap and water before and after you change your bandage (dressing). If you cannot use soap and water, use hand sanitizer. Change your bandage as told by your doctor. Check your insertion site every day for signs of infection. Check for: Redness, swelling, or pain. Bleeding from the site. Warmth. Pus or a bad smell. General instructions Take over-the-counter and prescription medicines only as told by your doctor. Rest as told by your doctor. Go back to your normal activities as told by your doctor. Keep all follow-up visits as told by your doctor. This is important. Contact a doctor if: You have itching or red, swollen areas of skin (hives). You feel worried or nervous (anxious). You feel weak after doing your normal activities. You have redness, swelling, warmth, or pain around the insertion site. You have blood coming from the insertion site, and the blood does not stop with pressure. You have pus or a bad smell coming from the insertion site. Get help right away if: You have signs of a serious reaction. This may be coming from an allergy or the body's defense system (immune system). Signs include: Trouble breathing or shortness of breath. Swelling of the face or feeling warm  (flushed). Fever or chills. Head, chest, or back pain. Dark pee (urine) or blood in the pee. Widespread rash. Fast heartbeat. Feeling dizzy or light-headed. You may receive your blood transfusion in an outpatient setting. If so, youwill be told whom to contact to report any reactions. These symptoms may be an emergency. Do not wait to see if the symptoms will go away. Get medical help right away. Call your local emergency services (911 in the [place] Do not drive yourself to the hospital. Summary Bruising and soreness at the IV site are common. Check your insertion site every day for signs of infection. Rest as told by your doctor. Go back to your normal activities as told by your doctor. Get help right away if you have signs of a serious reaction. This information is not intended to replace advice given to you by your health care provider. Make sure you discuss any questions you have with your healthcare provider. Document Revised: 01/23/2019 Document Reviewed: 01/23/2019 Elsevier Patient Education  2022 [employer]  

## 2021-01-24 NOTE — Progress Notes (Signed)
Hematology and Oncology Follow Up Visit  Jerome Kelley [id number] [birth year] 80 y.o. 01/24/2021   Principle Diagnosis:  Stage IIIB ([id number]) adenocarcinoma of the right middle lung History of stage Ia (T1aN0M0) adenocarcinoma the right lung - resected in March 2011 History of stage II adenocarcinoma of the colon-high risk - April 2010   Current Therapy:        Carboplatin/Alimta/Pembrolizumab - started 05/10/2020- s/p cycle 6 Radiation with weekly carboplatin/Taxol-start on 11/01/2020 -- completed on 12/20/2020   Interim History:  Jerome Kelley is here today for follow-up.  Surprisingly, he comes in a wheelchair.  He says he still is weak in the legs.  I am unsure as to why he still is weak in the legs.  The last time that we saw him, we gave him 2 units of blood.  Still looks a little dehydrated.  I think he needs to have some IV fluids.  I have to see what his electrolytes look like.  His hemoglobin is 9.5.  I am sure with fluids, the hemoglobin will go down.  His blood pressure is quite low.  I told him to stop the benazepril for right now.  Suspect he probably has peripheral vascular disease.  I think that 1 unit of blood would not be a bad idea for him.  I think he needs physical therapy.  I do still think he does much around the house.  I hate this that he is not doing well.  He has not had any treatment for at least a month.  I would think that he would have recovered by now.  He seems to be eating a little bit better.  There is no problems with bowels or bladder.  He has had no bleeding.  There is no fever.  There is no cough.  He is still smoking but his wife says not as much.  Currently, I would have to say his performance status is ECOG 3.    Medications:  Allergies as of 01/24/2021   No Known Allergies      Medication List        Accurate as of January 24, 2021  9:54 AM. If you have any questions, ask your nurse or doctor.          aspirin 81 MG chewable  tablet Chew 1 tablet (81 mg total) by mouth 2 (two) times daily. What changed: when to take this   benazepril 20 MG tablet Commonly known as: LOTENSIN Take 20 mg by mouth daily.   dexamethasone 4 MG tablet Commonly known as: DECADRON 1 TAB 2 TIMES A DAY THE DAY BEFORE ALIMTA CHEMO, THEN 2 TABS ONCE A DAY FOR 3 DAYS STARTING THE DAY AFTER CISPLATIN.   dexamethasone 4 MG tablet Commonly known as: DECADRON Take 2 tablets (8 mg total) by mouth daily. Start the day after chemotherapy for 2 days.   folic acid 1 MG tablet Commonly known as: FOLVITE Take 1 mg by mouth daily.   lidocaine-prilocaine cream Commonly known as: EMLA Apply to affected area once   LORazepam 0.5 MG tablet Commonly known as: ATIVAN Take one tablet (0.5 mg) every six hours as needed for nausea   ondansetron 8 MG tablet Commonly known as: Zofran Take 1 tablet (8 mg total) by mouth 2 (two) times daily as needed for refractory nausea / vomiting. Start on day 3 after chemo.   prochlorperazine 10 MG tablet Commonly known as: COMPAZINE Take 1 tablet (10 mg total) by mouth every  6 (six) hours as needed (Nausea or vomiting).   sucralfate 1 g tablet Commonly known as: Carafate Take 1 tablet (1 g total) by mouth 4 (four) times daily -  with meals and at bedtime. Crush and dissolve in 10 mL of warm water prior to swallowing   vitamin B-12 1000 MCG tablet Commonly known as: CYANOCOBALAMIN Take 1 tablet (1,000 mcg total) by mouth daily.        Allergies: No Known Allergies  Past Medical History, Surgical history, Social history, and Family History were reviewed and updated.  Review of Systems: All other 10 point review of systems is negative.   Physical Exam:  weight is 146 lb (66.2 kg). His oral temperature is 97.9 F (36.6 C). His blood pressure is 90/45 (abnormal) and his pulse is 81. His respiration is 18 and oxygen saturation is 100%.   Wt Readings from Last 3 Encounters:  01/24/21 146 lb (66.2 kg)   01/20/21 145 lb (65.8 kg)  12/20/20 154 lb (69.9 kg)    Ocular: Sclerae unicteric, pupils equal, round and reactive to light Ear-nose-throat: Oropharynx clear, dentition fair Lymphatic: No cervical or supraclavicular adenopathy Lungs no rales or rhonchi, good excursion bilaterally Heart regular rate and rhythm, no murmur appreciated Abd soft, nontender, positive bowel sounds MSK no focal spinal tenderness, no joint edema Neuro: non-focal, well-oriented, appropriate affect Breasts: Deferred   Lab Results  Component Value Date   WBC 3.9 (L) 01/24/2021   HGB 9.5 (L) 01/24/2021   HCT 29.9 (L) 01/24/2021   MCV 100.3 (H) 01/24/2021   PLT 98 (L) 01/24/2021   Lab Results  Component Value Date   FERRITIN 820 (H) 12/20/2020   IRON 90 12/20/2020   TIBC 193 (L) 12/20/2020   UIBC 103 (L) 12/20/2020   IRONPCTSAT 47 12/20/2020   Lab Results  Component Value Date   RBC 2.98 (L) 01/24/2021   No results found for: KPAFRELGTCHN, LAMBDASER, KAPLAMBRATIO No results found for: IGGSERUM, IGA, IGMSERUM No results found for: Odetta Pink, SPEI   Chemistry      Component Value Date/Time   NA 137 01/03/2021 1350   NA 140 06/28/2011 0950   K 3.3 (L) 01/03/2021 1350   K 4.7 06/28/2011 0950   CL 104 01/03/2021 1350   CL 100 06/28/2011 0950   CO2 26 01/03/2021 1350   CO2 29 06/28/2011 0950   BUN 22 01/03/2021 1350   BUN 10 06/28/2011 0950   CREATININE 0.72 01/03/2021 1350   CREATININE 0.7 06/28/2011 0950      Component Value Date/Time   CALCIUM 8.2 (L) 01/03/2021 1350   CALCIUM 9.0 06/28/2011 0950   ALKPHOS 72 01/03/2021 1350   ALKPHOS 110 (H) 12/22/2010 0850   AST 17 01/03/2021 1350   ALT 16 01/03/2021 1350   ALT 18 12/22/2010 0850   BILITOT 1.0 01/03/2021 1350       Impression and Plan: Jerome Kelley is a very pleasant 80 yo caucasian gentleman with previous history of right lung cancer as well as colon cancer 10 years ago. He  now has stage IIIb adenocarcinoma of the lung, right middle lobe.   Initially treated him with systemic chemotherapy.  He was not a candidate for surgery.  He was not candidate for curative chemoradiation therapy together.  After systemic chemotherapy, we then relied on radiation and low-dose carboplatinum/Taxol.  I had believe that he has responded.  I still would not do any scans probably until August.  We  really have to get his quality of life better.  I will go ahead and plan for physical therapy to see him.  We will make a referral.  He will get IV fluids today.  I will give him 1 unit of blood today.  I do think this will help him.  We will see about having her come back in a couple weeks or so.  Hopefully, he will be able to walk in.  [name], MD 6/13/20229:54 AM

## 2021-01-24 NOTE — Patient Instructions (Signed)

## 2021-01-25 LAB — BPAM RBC
Blood Product Expiration Date: 202206232359
ISSUE DATE / TIME: 202206131156
Unit Type and Rh: 5100

## 2021-01-25 LAB — TYPE AND SCREEN
ABO/RH(D): O POS
Antibody Screen: NEGATIVE
Unit division: 0

## 2021-01-27 ENCOUNTER — Other Ambulatory Visit: Payer: Self-pay | Admitting: Hematology & Oncology

## 2021-02-01 ENCOUNTER — Encounter: Payer: Self-pay | Admitting: Physical Therapy

## 2021-02-01 ENCOUNTER — Other Ambulatory Visit: Payer: Self-pay

## 2021-02-01 ENCOUNTER — Ambulatory Visit: Payer: Medicare Other | Attending: Hematology & Oncology | Admitting: Physical Therapy

## 2021-02-01 VITALS — BP 150/70 | HR 56

## 2021-02-01 DIAGNOSIS — H8111 Benign paroxysmal vertigo, right ear: Secondary | ICD-10-CM | POA: Insufficient documentation

## 2021-02-01 DIAGNOSIS — R262 Difficulty in walking, not elsewhere classified: Secondary | ICD-10-CM | POA: Insufficient documentation

## 2021-02-01 DIAGNOSIS — R2681 Unsteadiness on feet: Secondary | ICD-10-CM | POA: Diagnosis not present

## 2021-02-01 NOTE — Therapy (Signed)
[place], [place], [address] Phone: [phone]   Fax:  [phone]  Physical Therapy Evaluation  Patient Details  Name: Jerome Kelley MRN: [id number] Date of Birth: [birth year] Referring Provider (PT): Burney Gauze, MD   Encounter Date: 02/01/2021   PT End of Session - 02/01/21 1130     Visit Number 1    Number of Visits 13    Date for PT Re-Evaluation 03/15/21    Authorization Type Medicare & [employer]    PT Start Time 1037    PT Stop Time 1124    PT Time Calculation (min) 47 min    Activity Tolerance Patient tolerated treatment well    Behavior During Therapy [facility] for tasks assessed/performed             Past Medical History:  Diagnosis Date   Colon cancer ([place]) 09/18/2011   Goals of care, counseling/discussion 03/15/2020   History of chemotherapy    History of radiation therapy 11/04/2020-12/15/2020    mediastinal and right hilar region    Dr Gery Pray   Hypertension    Lung cancer, upper lobe ([place]) 09/18/2011   Malignant neoplasm of upper lobe of right lung ([place]) 04/30/2020    Past Surgical History:  Procedure Laterality Date   BRONCHIAL WASHINGS  03/19/2020   Procedure: BRONCHIAL WASHINGS;  Surgeon: Juanito Doom, MD;  Location: [facility];  Service: Cardiopulmonary;;   COLON SURGERY  2010   ENDOBRONCHIAL ULTRASOUND N/A 03/19/2020   Procedure: ENDOBRONCHIAL ULTRASOUND;  Surgeon: Juanito Doom, MD;  Location: [facility];  Service: Cardiopulmonary;  Laterality: N/A;   FINE NEEDLE ASPIRATION  03/19/2020   Procedure: FINE NEEDLE ASPIRATION;  Surgeon: Juanito Doom, MD;  Location: [facility];  Service: Cardiopulmonary;;   FINE NEEDLE ASPIRATION BIOPSY  04/02/2020   Procedure: FINE NEEDLE ASPIRATION BIOPSY;  Surgeon: Laurin Coder, MD;  Location: [facility];  Service: Pulmonary;;   IR IMAGING GUIDED PORT INSERTION  05/07/2020   LUNG SURGERY     right side    port a  cath insertion     port a cath removed     TOTAL HIP ARTHROPLASTY Right 10/18/2018   Procedure: TOTAL HIP ARTHROPLASTY ANTERIOR APPROACH;  Surgeon: Mcarthur Rossetti, MD;  Location: [facility];  Service: Orthopedics;  Laterality: Right;   VIDEO BRONCHOSCOPY N/A 03/19/2020   Procedure: VIDEO BRONCHOSCOPY WITHOUT FLUORO;  Surgeon: Juanito Doom, MD;  Location: [facility];  Service: Cardiopulmonary;  Laterality: N/A;   VIDEO BRONCHOSCOPY N/A 04/02/2020   Procedure: VIDEO BRONCHOSCOPY WITH EBUS;  Surgeon: Laurin Coder, MD;  Location: [facility];  Service: Pulmonary;  Laterality: N/A;    Vitals:   02/01/21 1039  BP: (!) 150/70  Pulse: (!) 56  SpO2: 100%      Subjective Assessment - 02/01/21 1041     Subjective Patient reports weakness and pain in B ankles- worse when walking and standing. This has been going on for 2-3 months since he was out in the garden planting some tomato plants. Ankle pain occurs over the anterior aspect. Better with inserts. Has not been using an AD because he doesn't think he needs it. Reports that his equilibrium is off in the AM when he gets up and when he lays down. Notes that it feels like spinning. Sometimes feels off balance when he's up and about.    Pertinent History colon CA 2013, HTN, lung CA, R THA 2020  Limitations Lifting;Standing;Walking;House hold activities    Diagnostic tests none    Patient Stated Goals work on equilibrium    Currently in Pain? Yes    Pain Score 1     Pain Location Ankle    Pain Orientation Right;Left;Anterior    Pain Descriptors / Indicators Aching    Pain Type Chronic pain                [facility] PT Assessment - 02/01/21 1049       Assessment   Medical Diagnosis B LE weakness    Referring Provider (PT) Burney Gauze, MD    Onset Date/Surgical Date 12/02/20    Next MD Visit 02/08/21    Prior Therapy yes-  HHPT?      Precautions   Precautions --   lung CA- notes that he is done with chemo and radiation      Balance Screen   Has the patient fallen in the past 6 months No    Has the patient had a decrease in activity level because of a fear of falling?  No    Is the patient reluctant to leave their home because of a fear of falling?  No      Home Social worker Private residence    Living Arrangements Spouse/significant other    Available Help at Discharge Family    Type of Home --   mobile home   [place] to enter    Entrance Stairs-Number of Steps 1    [place] One level    [place] - single point;Walker - 2 wheels;Grab bars - tub/shower      Prior Function   Level of Independence Independent    Vocation Retired    Leisure none      Cognition   Overall Cognitive Status Within Functional Limits for tasks assessed      Sensation   Light Touch Appears Intact   notes N/T over anteiror B ankles     Coordination   Gross Motor Movements are Fluid and Coordinated Yes      Posture/Postural Control   Posture/Postural Control Postural limitations    Postural Limitations Rounded Shoulders;Forward head;Increased thoracic kyphosis      ROM / Strength   AROM / PROM / Strength AROM;Strength      AROM   AROM Assessment Site Ankle    Right/Left Ankle Right;Left    Right Ankle Dorsiflexion 12    Left Ankle Dorsiflexion 10      Strength   Strength Assessment Site Hip;Knee;Ankle    Right/Left Hip Right;Left    Right Hip Flexion 4+/5    Right Hip ABduction 4+/5    Right Hip ADduction 4+/5    Left Hip Flexion 4+/5    Left Hip ABduction 4+/5    Left Hip ADduction 4+/5    Right/Left Knee Right;Left    Right Knee Flexion 4+/5    Right Knee Extension 5/5    Left Knee Flexion 4+/5    Left Knee Extension 5/5    Right/Left Ankle Right;Left    Right Ankle Dorsiflexion 5/5    Right Ankle Plantar Flexion 4/5    Right Ankle Inversion 4+/5    Right Ankle Eversion 4/5    Left Ankle Dorsiflexion 5/5    Left  Ankle Plantar Flexion 4/5    Left Ankle Inversion 4/5    Left Ankle Eversion 4+/5      Palpation  Palpation comment slightly TTP over L anterolateral ankle; very mild B ankle edema      Ambulation/Gait   Assistive device None    Gait Pattern Step-to pattern;Step-through pattern;Decreased step length - right;Decreased step length - left;Poor foot clearance - right;Poor foot clearance - left   no heel-toe pattern   Ambulation Surface Level;Indoor    Gait velocity decreased                    Vestibular Assessment - 02/01/21 1101       Symptom Behavior   Type of Dizziness  Spinning    Frequency of Dizziness daily    Duration of Dizziness seconds    Symptom Nature Positional    Aggravating Factors Supine to sit;Lying supine    Relieving Factors Rest      Positional Testing   Sidelying Test Sidelying Right      Sidelying Right   Sidelying Right Duration 20 sec   2nd trial- no dizziness   Sidelying Right Symptoms Upbeat, right rotatory nystagmus                Objective measurements completed on examination: See above findings.        Vestibular Treatment/Exercise - 02/01/21 0001       Vestibular Treatment/Exercise   Vestibular Treatment Provided Canalith Repositioning    Canalith Repositioning Epley Manuever Right       EPLEY MANUEVER RIGHT   Number of Reps  1    Overall Response Improved Symptoms    Response Details  good tolerance                   PT Education - 02/01/21 1125     Education Details prognosis, POC, edu on post-epley instructions and etiology of BPPV; advised patient to use AD with ambulation for max safety    Person(s) Educated Patient    Methods Explanation;Demonstration;Tactile cues;Verbal cues;Handout    Comprehension Verbalized understanding              PT Short Term Goals - 02/01/21 1138       PT SHORT TERM GOAL #1   Title Patient to be independent with initial HEP.    Time 3    Period Weeks     Status New    Target Date 02/22/21               PT Long Term Goals - 02/01/21 1138       PT LONG TERM GOAL #1   Title Patient to be independent with advanced HEP.    Time 6    Period Weeks    Status New    Target Date 03/15/21      PT LONG TERM GOAL #2   Title Patient to demonstrate B ankle strength >/=4+/5.    Time 6    Period Weeks    Status New    Target Date 03/15/21      PT LONG TERM GOAL #3   Title Patient to report 0/10 dizziness with bed mobility.    Time 6    Period Weeks    Status New    Target Date 03/15/21      PT LONG TERM GOAL #4   Title Patient to score <15 sec on 5xSTS in order to reduce risk of falls.    Time 6    Period Weeks    Status New    Target Date 03/15/21      PT  LONG TERM GOAL #5   Title Patient to score atleast 20/24 on DGI in order to reduce risk of falls.    Time 6    Period Weeks    Status New    Target Date 03/15/21      Additional Long Term Goals   Additional Long Term Goals Yes      PT LONG TERM GOAL #6   Title Patient to demonstrate 50% improved heel-toe pattern and step length with ambulation with LRAD.    Time 6    Period Weeks    Status New    Target Date 03/15/21                    Plan - 02/01/21 1133     Clinical Impression Statement Patient is an 80 y/o M presenting to OPPT with c/o chronic B anterior ankle pain and dizziness for the past 2-3 months. Pain worse with prolonged standing and walking, with intermittent N/T over the anterior ankles. Dizziness is described as "spinning" and lasts seconds. Worse with supine<>sit. Patient today ambulating with unsteady gait pattern without AD, notes that he does not use AD with ambulation but intermittently feels unsteady. Patient today presenting with forward head and kyphotic posture, slightly limited ankle dorsiflexion AROM, B ankle weakness, slightly TTP over L anterolateral ankle, very mild B ankle edema, and gait deviations. Patient demonstrated positive  R sidelying test and R DH and tolerated R Epley well, with negative R DH upon 2nd trial. Patient was educated on post-Epley instructions- patient reported understanding. Would benefit from skilled PT services 2x/week for 6 weeks to address aforementioned impairments.    Personal Factors and Comorbidities Age;Comorbidity 3+;Fitness;Past/Current Experience;Time since onset of injury/illness/exacerbation    Comorbidities colon CA 2013, HTN, lung CA, R THA 2020    Examination-Activity Limitations Bathing;Bend;Squat;Bed Mobility;Sleep;Stairs;Stand;Carry;Transfers;Dressing;Hygiene/Grooming;Lift;Locomotion Level;Reach Overhead    Examination-Participation Restrictions Meal Prep;Laundry;Yard Work;Driving;Shop;Community Activity;Cleaning;Church    Stability/Clinical Decision Making Stable/Uncomplicated    Clinical Decision Making Low    Rehab Potential Good    PT Frequency 2x / week    PT Duration 6 weeks    PT Treatment/Interventions ADLs/Self Care Home Management;Canalith Repostioning;Cryotherapy;Electrical Stimulation;Iontophoresis 4mg /ml Dexamethasone;Moist Heat;Balance training;Therapeutic exercise;Therapeutic activities;Functional mobility training;Stair training;Gait training;DME Instruction;Ultrasound;Neuromuscular re-education;Patient/family education;Manual techniques;Vestibular;Vasopneumatic Device;Taping;Energy conservation;Dry needling;Passive range of motion    PT Next Visit Plan reassess positional testing if dizziness continues; initiate HEP for ankle stretching, strengthening    Consulted and Agree with Plan of Care Patient             Patient will benefit from skilled therapeutic intervention in order to improve the following deficits and impairments:  Abnormal gait, Decreased endurance, Increased edema, Cardiopulmonary status limiting activity, Decreased activity tolerance, Decreased strength, Increased fascial restricitons, Pain, Decreased balance, Difficulty walking, Improper body  mechanics, Dizziness, Decreased range of motion, Impaired flexibility, Postural dysfunction  Visit Diagnosis: BPPV (benign paroxysmal positional vertigo), right  Unsteadiness on feet  Difficulty in walking, not elsewhere classified     Problem List Patient Active Problem List   Diagnosis Date Noted   COPD, mild ([place]) 01/24/2021   Port-A-Cath in place 10/29/2020   Malignant neoplasm of upper lobe of right lung ([place]) 04/30/2020   Goals of care, counseling/discussion 03/15/2020   Hip fracture ([place]) 10/18/2018   Essential hypertension 10/18/2018   Closed fracture of neck of right femur ([place])    Colon cancer ([place]) 09/18/2011   Lung cancer, upper lobe ([place]) 09/18/2011     Janene Harvey, PT, DPT 02/01/21 11:43  AM    [facility] [address]  [place], [place], [address] Phone: [phone]   Fax:  [phone]  Name: Jerome Kelley MRN: [id number] Date of Birth: [birth year]

## 2021-02-07 ENCOUNTER — Telehealth: Payer: Self-pay

## 2021-02-07 ENCOUNTER — Other Ambulatory Visit: Payer: Self-pay

## 2021-02-07 ENCOUNTER — Inpatient Hospital Stay (HOSPITAL_BASED_OUTPATIENT_CLINIC_OR_DEPARTMENT_OTHER): Payer: Medicare Other | Admitting: Hematology & Oncology

## 2021-02-07 ENCOUNTER — Ambulatory Visit: Payer: Medicare Other

## 2021-02-07 ENCOUNTER — Inpatient Hospital Stay: Payer: Medicare Other

## 2021-02-07 ENCOUNTER — Encounter: Payer: Self-pay | Admitting: Hematology & Oncology

## 2021-02-07 VITALS — BP 148/65 | HR 75 | Temp 97.7°F | Resp 17

## 2021-02-07 VITALS — BP 133/74 | HR 70 | Temp 98.0°F | Resp 18 | Wt 150.6 lb

## 2021-02-07 DIAGNOSIS — Z7982 Long term (current) use of aspirin: Secondary | ICD-10-CM | POA: Diagnosis not present

## 2021-02-07 DIAGNOSIS — Z79899 Other long term (current) drug therapy: Secondary | ICD-10-CM | POA: Diagnosis not present

## 2021-02-07 DIAGNOSIS — C342 Malignant neoplasm of middle lobe, bronchus or lung: Secondary | ICD-10-CM | POA: Diagnosis not present

## 2021-02-07 DIAGNOSIS — C3410 Malignant neoplasm of upper lobe, unspecified bronchus or lung: Secondary | ICD-10-CM

## 2021-02-07 DIAGNOSIS — D630 Anemia in neoplastic disease: Secondary | ICD-10-CM | POA: Diagnosis not present

## 2021-02-07 DIAGNOSIS — C3411 Malignant neoplasm of upper lobe, right bronchus or lung: Secondary | ICD-10-CM

## 2021-02-07 DIAGNOSIS — Z95828 Presence of other vascular implants and grafts: Secondary | ICD-10-CM

## 2021-02-07 LAB — CMP (CANCER CENTER ONLY)
ALT: 10 U/L (ref 0–44)
AST: 12 U/L — ABNORMAL LOW (ref 15–41)
Albumin: 3.1 g/dL — ABNORMAL LOW (ref 3.5–5.0)
Alkaline Phosphatase: 121 U/L (ref 38–126)
Anion gap: 5 (ref 5–15)
BUN: 12 mg/dL (ref 8–23)
CO2: 28 mmol/L (ref 22–32)
Calcium: 8.7 mg/dL — ABNORMAL LOW (ref 8.9–10.3)
Chloride: 107 mmol/L (ref 98–111)
Creatinine: 0.66 mg/dL (ref 0.61–1.24)
GFR, Estimated: >60 mL/min (ref >60)
Glucose, Bld: 113 mg/dL — ABNORMAL HIGH (ref 70–99)
Potassium: 3.6 mmol/L (ref 3.5–5.1)
Sodium: 140 mmol/L (ref 135–145)
Total Bilirubin: 0.5 mg/dL (ref 0.3–1.2)
Total Protein: 5.8 g/dL — ABNORMAL LOW (ref 6.5–8.1)

## 2021-02-07 LAB — CBC WITH DIFFERENTIAL (CANCER CENTER ONLY)
Abs Immature Granulocytes: 0.02 10*3/uL (ref 0.00–0.07)
Basophils Absolute: 0 10*3/uL (ref 0.0–0.1)
Basophils Relative: 0 %
Eosinophils Absolute: 0.1 10*3/uL (ref 0.0–0.5)
Eosinophils Relative: 2 %
HCT: 32.7 % — ABNORMAL LOW (ref 39.0–52.0)
Hemoglobin: 10.5 g/dL — ABNORMAL LOW (ref 13.0–17.0)
Immature Granulocytes: 1 %
Lymphocytes Relative: 25 %
Lymphs Abs: 1.1 10*3/uL (ref 0.7–4.0)
MCH: 32.1 pg (ref 26.0–34.0)
MCHC: 32.1 g/dL (ref 30.0–36.0)
MCV: 100 fL (ref 80.0–100.0)
Monocytes Absolute: 0.5 10*3/uL (ref 0.1–1.0)
Monocytes Relative: 11 %
Neutro Abs: 2.5 10*3/uL (ref 1.7–7.7)
Neutrophils Relative %: 61 %
Platelet Count: 83 10*3/uL — ABNORMAL LOW (ref 150–400)
RBC: 3.27 MIL/uL — ABNORMAL LOW (ref 4.22–5.81)
RDW: 17.2 % — ABNORMAL HIGH (ref 11.5–15.5)
WBC Count: 4.1 10*3/uL (ref 4.0–10.5)
nRBC: 0 % (ref 0.0–0.2)

## 2021-02-07 LAB — SAMPLE TO BLOOD BANK

## 2021-02-07 LAB — MAGNESIUM: Magnesium: 1.8 mg/dL (ref 1.7–2.4)

## 2021-02-07 MED ORDER — HEPARIN SOD (PORK) LOCK FLUSH 100 UNIT/ML IV SOLN
500.0000 [IU] | Freq: Once | INTRAVENOUS | Status: AC
Start: 2021-02-07 — End: 2021-02-07
  Administered 2021-02-07: 500 [IU] via INTRAVENOUS
  Filled 2021-02-07: qty 5

## 2021-02-07 MED ORDER — SODIUM CHLORIDE 0.9% FLUSH
10.0000 mL | Freq: Once | INTRAVENOUS | Status: AC
  Administered 2021-02-07: 10 mL via INTRAVENOUS
  Filled 2021-02-07: qty 10

## 2021-02-07 NOTE — Telephone Encounter (Signed)
Appts made and printed for pt per 02/07/21 los   Jerome Kelley 

## 2021-02-07 NOTE — Addendum Note (Signed)
Addended by: Tyler Aas A on: 02/07/2021 01:09 PM   Modules accepted: Orders

## 2021-02-07 NOTE — Progress Notes (Signed)
Hematology and Oncology Follow Up Visit  Jerome Kelley [id number] 30-Jun-1941 80 y.o. 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  He is following doing better.  When we last saw him, he came in in a wheelchair.  Today, he walks in.  He is eating better.  His weight is up 4 pounds.  I am not sure exactly what happened to the last time that we saw him but regardless, he is finally making a recovery.  With I think we did transfuse him the last time we saw him.  I think the transfusion helped quite a bit.  He has had no problems with cough or shortness of breath.  He has had no nausea or vomiting.  He has had no bleeding.  There has been no fever.  We will go ahead and plan for another PET scan on him probably after Labor Day.  I am just happy that he is feeling better.  I am glad that his quality life is starting to improve.  Overall, his performance status is ECOG 1.    Medications:  Allergies as of 02/07/2021   No Known Allergies      Medication List        Accurate as of February 07, 2021  1:23 PM. If you have any questions, ask your nurse or doctor.          aspirin 81 MG chewable tablet Chew 1 tablet (81 mg total) by mouth 2 (two) times daily. What changed: when to take this   benazepril 20 MG tablet Commonly known as: LOTENSIN Take 20 mg by mouth daily.   dexamethasone 4 MG tablet Commonly known as: DECADRON 1 TAB 2 TIMES A DAY THE DAY BEFORE ALIMTA CHEMO, THEN 2 TABS ONCE A DAY FOR 3 DAYS STARTING THE DAY AFTER CISPLATIN.    dexamethasone 4 MG tablet Commonly known as: DECADRON Take 2 tablets (8 mg total) by mouth daily. Start the day after chemotherapy for 2 days.   folic acid 1 MG tablet Commonly known as: FOLVITE Take 1 mg by mouth daily.   lidocaine-prilocaine cream Commonly known as: EMLA Apply to affected area once   LORazepam 0.5 MG tablet Commonly known as: ATIVAN TAKE ONE TABLET (0.5 MG) EVERY SIX HOURS AS NEEDED FOR NAUSEA   ondansetron 8 MG tablet Commonly known as: Zofran Take 1 tablet (8 mg total) by mouth 2 (two) times daily as needed for refractory nausea / vomiting. Start on day 3 after chemo.   prochlorperazine 10 MG tablet Commonly known as: COMPAZINE Take 1 tablet (10 mg total) by mouth every 6 (six) hours as needed (Nausea or vomiting).   sucralfate 1 g tablet Commonly known as: Carafate Take 1 tablet (1 g total) by mouth 4 (four) times daily -  with meals and at bedtime. Crush and dissolve in 10 mL of warm water prior to swallowing   vitamin B-12 1000 MCG tablet Commonly known as: CYANOCOBALAMIN Take 1 tablet (1,000 mcg total) by mouth daily.        Allergies: No Known Allergies  Past Medical History, Surgical history, Social history, and Family History were reviewed and updated.  Review of Systems: All other 10 point review of systems is negative.   Physical Exam:  weight is 150 lb 9.6 oz (68.3 kg). His oral temperature is 98 F (36.7 C). His blood pressure is 133/74 and his pulse is 70. His respiration is 18 and oxygen saturation is 100%.   Wt Readings from Last 3 Encounters:  02/07/21 150 lb 9.6 oz (68.3 kg)  01/24/21 146 lb (66.2 kg)  01/20/21 145 lb (65.8 kg)    Ocular: Sclerae unicteric, pupils equal, round and reactive to light Ear-nose-throat: Oropharynx clear, dentition fair Lymphatic: No cervical or supraclavicular adenopathy Lungs no rales or rhonchi, good excursion bilaterally Heart regular rate and rhythm, no murmur appreciated Abd soft,  nontender, positive bowel sounds MSK no focal spinal tenderness, no joint edema Neuro: non-focal, well-oriented, appropriate affect Breasts: Deferred   Lab Results  Component Value Date   WBC 4.1 02/07/2021   HGB 10.5 (L) 02/07/2021   HCT 32.7 (L) 02/07/2021   MCV 100.0 02/07/2021   PLT 83 (L) 02/07/2021   Lab Results  Component Value Date   FERRITIN 577 (H) 01/24/2021   IRON 59 01/24/2021   TIBC 180 (L) 01/24/2021   UIBC 121 01/24/2021   IRONPCTSAT 33 01/24/2021   Lab Results  Component Value Date   RBC 3.27 (L) 02/07/2021   No results found for: KPAFRELGTCHN, LAMBDASER, KAPLAMBRATIO No results found for: IGGSERUM, IGA, IGMSERUM No results found for: Kathrynn Ducking, MSPIKE, SPEI   Chemistry      Component Value Date/Time   NA 140 02/07/2021 1150   NA 140 06/28/2011 0950   K 3.6 02/07/2021 1150   K 4.7 06/28/2011 0950   CL 107 02/07/2021 1150   CL 100 06/28/2011 0950   CO2 28 02/07/2021 1150   CO2 29 06/28/2011 0950   BUN 12 02/07/2021 1150   BUN 10 06/28/2011 0950   CREATININE 0.66 02/07/2021 1150   CREATININE 0.7 06/28/2011 0950      Component Value Date/Time   CALCIUM 8.7 (L) 02/07/2021 1150   CALCIUM 9.0 06/28/2011 0950   ALKPHOS 121 02/07/2021 1150   ALKPHOS 110 (H) 12/22/2010 0850   AST 12 (L) 02/07/2021 1150   ALT 10 02/07/2021 1150   ALT 18 12/22/2010 0850   BILITOT 0.5 02/07/2021 1150       Impression and Plan: Jerome Kelley is a very pleasant 80 yo caucasian gentleman with previous history of right lung cancer as well as colon cancer 10 years ago. He now has stage IIIb adenocarcinoma of the lung, right middle lobe.   We initially treated him with systemic chemotherapy.  He was not a candidate for surgery.  He was not candidate for curative chemoradiation therapy together.  After systemic chemotherapy, we then relied on radiation and low-dose carboplatinum/Taxol.  I I have to had believe that he has  responded.  Again, it is his quality life that is better.  He is feeling better.  He is eating better.  He has taste for food.  I think we can get him back after Labor Day.  We will do a PET scan the week before I see him.  This should really give [place] a good idea as to how well he responded to radiation therapy with low-dose chemotherapy.  [name], MD 6/27/20221:23 PM

## 2021-02-07 NOTE — Patient Instructions (Signed)
Implanted Port Home Guide An implanted port is a device that is placed under the skin. It is usually placed in the chest. The device can be used to give IV medicine, to take blood, or for dialysis. You may have an implanted port if: You need IV medicine that would be irritating to the small veins in your hands or arms. You need IV medicines, such as antibiotics, for a long period of time. You need IV nutrition for a long period of time. You need dialysis. When you have a port, your health care provider can choose to use the port instead of veins in your arms for these procedures. You may have fewer limitations when using a port than you would if you used other types of long-term IVs, and you will likely be able to return to normal activities afteryour incision heals. An implanted port has two main parts: Reservoir. The reservoir is the part where a needle is inserted to give medicines or draw blood. The reservoir is round. After it is placed, it appears as a small, raised area under your skin. Catheter. The catheter is a thin, flexible tube that connects the reservoir to a vein. Medicine that is inserted into the reservoir goes into the catheter and then into the vein. How is my port accessed? To access your port: A numbing cream may be placed on the skin over the port site. Your health care provider will put on a mask and sterile gloves. The skin over your port will be cleaned carefully with a germ-killing soap and allowed to dry. Your health care provider will gently pinch the port and insert a needle into it. Your health care provider will check for a blood return to make sure the port is in the vein and is not clogged. If your port needs to remain accessed to get medicine continuously (constant infusion), your health care provider will place a clear bandage (dressing) over the needle site. The dressing and needle will need to be changed every week, or as told by your health care provider. What  is flushing? Flushing helps keep the port from getting clogged. Follow instructions from your health care provider about how and when to flush the port. Ports are usually flushed with saline solution or a medicine called heparin. The need for flushing will depend on how the port is used: If the port is only used from time to time to give medicines or draw blood, the port may need to be flushed: Before and after medicines have been given. Before and after blood has been drawn. As part of routine maintenance. Flushing may be recommended every 4-6 weeks. If a constant infusion is running, the port may not need to be flushed. Throw away any syringes in a disposal container that is meant for sharp items (sharps container). You can buy a sharps container from a pharmacy, or you can make one by using an empty hard plastic bottle with a cover. How long will my port stay implanted? The port can stay in for as long as your health care provider thinks it is needed. When it is time for the port to come out, a surgery will be done to remove it. The surgery will be similar to the procedure that was done to putthe port in. Follow these instructions at home:  Flush your port as told by your health care provider. If you need an infusion over several days, follow instructions from your health care provider about how to take   care of your port site. Make sure you: Wash your hands with soap and water before you change your dressing. If soap and water are not available, use alcohol-based hand sanitizer. Change your dressing as told by your health care provider. Place any used dressings or infusion bags into a plastic bag. Throw that bag in the trash. Keep the dressing that covers the needle clean and dry. Do not get it wet. Do not use scissors or sharp objects near the tube. Keep the tube clamped, unless it is being used. Check your port site every day for signs of infection. Check for: Redness, swelling, or  pain. Fluid or blood. Pus or a bad smell. Protect the skin around the port site. Avoid wearing bra straps that rub or irritate the site. Protect the skin around your port from seat belts. Place a soft pad over your chest if needed. Bathe or shower as told by your health care provider. The site may get wet as long as you are not actively receiving an infusion. Return to your normal activities as told by your health care provider. Ask your health care provider what activities are safe for you. Carry a medical alert card or wear a medical alert bracelet at all times. This will let health care providers know that you have an implanted port in case of an emergency. Get help right away if: You have redness, swelling, or pain at the port site. You have fluid or blood coming from your port site. You have pus or a bad smell coming from the port site. You have a fever. Summary Implanted ports are usually placed in the chest for long-term IV access. Follow instructions from your health care provider about flushing the port and changing bandages (dressings). Take care of the area around your port by avoiding clothing that puts pressure on the area, and by watching for signs of infection. Protect the skin around your port from seat belts. Place a soft pad over your chest if needed. Get help right away if you have a fever or you have redness, swelling, pain, drainage, or a bad smell at the port site. This information is not intended to replace advice given to you by your health care provider. Make sure you discuss any questions you have with your healthcare provider. Document Revised: 12/15/2019 Document Reviewed: 12/15/2019 Elsevier Patient Education  2022 Elsevier Inc.  

## 2021-02-08 ENCOUNTER — Inpatient Hospital Stay: Payer: Medicare Other

## 2021-02-08 ENCOUNTER — Inpatient Hospital Stay: Payer: Medicare Other | Admitting: Hematology & Oncology

## 2021-02-09 ENCOUNTER — Other Ambulatory Visit: Payer: Self-pay

## 2021-02-09 ENCOUNTER — Ambulatory Visit: Payer: Medicare Other | Admitting: Physical Therapy

## 2021-02-09 ENCOUNTER — Encounter: Payer: Self-pay | Admitting: Physical Therapy

## 2021-02-09 DIAGNOSIS — H8111 Benign paroxysmal vertigo, right ear: Secondary | ICD-10-CM | POA: Diagnosis not present

## 2021-02-09 DIAGNOSIS — R262 Difficulty in walking, not elsewhere classified: Secondary | ICD-10-CM

## 2021-02-09 DIAGNOSIS — R2681 Unsteadiness on feet: Secondary | ICD-10-CM

## 2021-02-09 NOTE — Therapy (Signed)
[place], [place], [address] Phone: [phone]   Fax:  [phone]  Physical Therapy Treatment  Patient Details  Name: Jerome Kelley MRN: [id number] Date of Birth: Dec 27, 1940 Referring Provider (PT): Burney Gauze, MD   Encounter Date: 02/09/2021   PT End of Session - 02/09/21 1153     Visit Number 2    Number of Visits 13    Date for PT Re-Evaluation 03/15/21    Authorization Type Medicare & [employer]    PT Start Time 1103    PT Stop Time 1150    PT Time Calculation (min) 47 min    Equipment Utilized During Treatment Gait belt    Activity Tolerance Patient tolerated treatment well    Behavior During Therapy [facility] for tasks assessed/performed             Past Medical History:  Diagnosis Date   Colon cancer ([place]) 09/18/2011   Goals of care, counseling/discussion 03/15/2020   History of chemotherapy    History of radiation therapy 11/04/2020-12/15/2020    mediastinal and right hilar region    Dr Gery Pray   Hypertension    Lung cancer, upper lobe ([place]) 09/18/2011   Malignant neoplasm of upper lobe of right lung ([place]) 04/30/2020    Past Surgical History:  Procedure Laterality Date   BRONCHIAL WASHINGS  03/19/2020   Procedure: BRONCHIAL WASHINGS;  Surgeon: Juanito Doom, MD;  Location: [facility];  Service: Cardiopulmonary;;   COLON SURGERY  2010   ENDOBRONCHIAL ULTRASOUND N/A 03/19/2020   Procedure: ENDOBRONCHIAL ULTRASOUND;  Surgeon: Juanito Doom, MD;  Location: [facility];  Service: Cardiopulmonary;  Laterality: N/A;   FINE NEEDLE ASPIRATION  03/19/2020   Procedure: FINE NEEDLE ASPIRATION;  Surgeon: Juanito Doom, MD;  Location: [facility];  Service: Cardiopulmonary;;   FINE NEEDLE ASPIRATION BIOPSY  04/02/2020   Procedure: FINE NEEDLE ASPIRATION BIOPSY;  Surgeon: Laurin Coder, MD;  Location: [facility];  Service: Pulmonary;;   IR IMAGING GUIDED PORT INSERTION   05/07/2020   LUNG SURGERY     right side    port a cath insertion     port a cath removed     TOTAL HIP ARTHROPLASTY Right 10/18/2018   Procedure: TOTAL HIP ARTHROPLASTY ANTERIOR APPROACH;  Surgeon: Mcarthur Rossetti, MD;  [facility]: [facility];  Service: Orthopedics;  Laterality: Right;   VIDEO BRONCHOSCOPY N/A 03/19/2020   Procedure: VIDEO BRONCHOSCOPY WITHOUT FLUORO;  Surgeon: Juanito Doom, MD;  [facility];  Service: Cardiopulmonary;  Laterality: N/A;   VIDEO BRONCHOSCOPY N/A 04/02/2020   Procedure: VIDEO BRONCHOSCOPY WITH EBUS;  Surgeon: Laurin Coder, MD;  [facility];  Service: Pulmonary;  Laterality: N/A;    There were no vitals filed for this visit.   Subjective Assessment - 02/09/21 1103     Subjective Bringing in a cane today but "I'm not sure what it's for" as he feels that he does fine when he is walking around. Still has dizzy spells when he lays down.    Pertinent History colon CA 2013, HTN, lung CA, R THA 2020    Diagnostic tests none    Patient Stated Goals work on equilibrium    Currently in Pain? No/denies                [facility] PT Assessment - 02/09/21 0001       Standardized Balance Assessment   Standardized Balance Assessment Dynamic  Gait Index;Five Times Sit to Stand    Five times sit to stand comments  15.87 sec   without UEs, difficulty standing fully upright     Dynamic Gait Index   Level Surface Normal    Change in Gait Speed Mild Impairment    Gait with Horizontal Head Turns Mild Impairment    Gait with Vertical Head Turns Mild Impairment    Gait and Pivot Turn Normal    Step Over Obstacle Normal    Step Around Obstacles Severe Impairment    Steps Mild Impairment    Total Score 17                 Vestibular Assessment - 02/09/21 0001       Positional Testing   Dix-Hallpike Dix-Hallpike Right;Dix-Hallpike Left    Sidelying Test Sidelying Left      Dix-Hallpike Right   Dix-Hallpike Right Duration >1  min    Dix-Hallpike Right Symptoms Upbeat, right rotatory nystagmus   dizziness- nonfatigable; 2nd trail- no dizziness or nystagmus     Sidelying Right   Sidelying Right Duration 0    Sidelying Right Symptoms No nystagmus   c/o dizziness upong sitting     Sidelying Left   Sidelying Left Duration 0    Sidelying Left Symptoms No nystagmus   dizziness upon sitting                     [facility] Adult PT Treatment/Exercise - 02/09/21 0001       Ambulation/Gait   Ambulation Distance (Feet) 180 Feet    Assistive device None    Gait Pattern Step-to pattern;Step-through pattern;Decreased step length - right;Decreased step length - left;Poor foot clearance - right;Poor foot clearance - left    Ambulation Surface Level;Indoor    Gait velocity decreased    Gait Comments gait training with SPC with cues for proper sequencing, increasing R step length, and improving B heel-toe pattern             Vestibular Treatment/Exercise - 02/09/21 0001       Vestibular Treatment/Exercise   Canalith Repositioning Semont Procedure Right Posterior      Semont Procedure Right Posterior   Number of Reps  1    Overall Response  --   good tolerance   Response Details  mild dizziness upon sitting                   PT Education - 02/09/21 1152     Education Details educated patient and wife on significance of balance testing today and importance of using SPC for safety    Person(s) Educated Patient;Spouse    Methods Explanation;Demonstration;Tactile cues;Verbal cues    Comprehension Verbalized understanding;Returned demonstration              PT Short Term Goals - 02/09/21 1154       PT SHORT TERM GOAL #1   Title Patient to be independent with initial HEP.    Time 3    Period Weeks    Status On-going    Target Date 02/22/21               PT Long Term Goals - 02/09/21 1154       PT LONG TERM GOAL #1   Title Patient to be independent with advanced HEP.    Time  6    Period Weeks    Status On-going      PT LONG TERM GOAL #  2   Title Patient to demonstrate B ankle strength >/=4+/5.    Time 6    Period Weeks    Status On-going      PT LONG TERM GOAL #3   Title Patient to report 0/10 dizziness with bed mobility.    Time 6    Period Weeks    Status On-going      PT LONG TERM GOAL #4   Title Patient to score <15 sec on 5xSTS in order to reduce risk of falls.    Time 6    Period Weeks    Status On-going      PT LONG TERM GOAL #5   Title Patient to score atleast 20/24 on DGI in order to reduce risk of falls.    Time 6    Period Weeks    Status On-going      PT LONG TERM GOAL #6   Title Patient to demonstrate 50% improved heel-toe pattern and step length with ambulation with LRAD.    Time 6    Period Weeks    Status On-going                   Plan - 02/09/21 1154     Clinical Impression Statement Patient arrived to session with report of continued dizziness upon laying down to go to bed and bringing in [facility] despite not using it for ambulation. Patient's score on 5xSTS today indicates an increased risk of falls as patient required increased time and demonstrated difficulty standing fully upright with each rep. Patient's score on DGI also indicates an increased risk of falls, thus encouraged patient to ambulate with SPC inside and outside for increased stability. Practiced gait training with SPC with cueing for proper SPC sequencing, increasing R step length, and improving B heel-toe pattern. Patient demonstrated improved gait pattern after practice, however will require continued cueing and practice. Patient demonstrated low amplitude R upbeating torsional nystagmus with R DH today which was nonfatigable. Treated patient with R Semont, with 2nd trial of R DH eliciting no dizziness or nystagmus. Plan to continue treating patient as needed for dizziness in future sessions. No complaints at end of session.    Comorbidities colon CA 2013,  HTN, lung CA, R THA 2020    PT Treatment/Interventions ADLs/Self Care Home Management;Canalith Repostioning;Cryotherapy;Electrical Stimulation;Iontophoresis 4mg /ml Dexamethasone;Moist Heat;Balance training;Therapeutic exercise;Therapeutic activities;Functional mobility training;Stair training;Gait training;DME Instruction;Ultrasound;Neuromuscular re-education;Patient/family education;Manual techniques;Vestibular;Vasopneumatic Device;Taping;Energy conservation;Dry needling;Passive range of motion    PT Next Visit Plan reassess positional testing if dizziness continues; initiate HEP for ankle stretching, strengthening    Consulted and Agree with Plan of Care Patient             Patient will benefit from skilled therapeutic intervention in order to improve the following deficits and impairments:  Abnormal gait, Decreased endurance, Increased edema, Cardiopulmonary status limiting activity, Decreased activity tolerance, Decreased strength, Increased fascial restricitons, Pain, Decreased balance, Difficulty walking, Improper body mechanics, Dizziness, Decreased range of motion, Impaired flexibility, Postural dysfunction  Visit Diagnosis: BPPV (benign paroxysmal positional vertigo), right  Unsteadiness on feet  Difficulty in walking, not elsewhere classified     Problem List Patient Active Problem List   Diagnosis Date Noted   COPD, mild ([place]) 01/24/2021   Port-A-Cath in place 10/29/2020   Malignant neoplasm of upper lobe of right lung ([place]) 04/30/2020   Goals of care, counseling/discussion 03/15/2020   Hip fracture ([place]) 10/18/2018   Essential hypertension 10/18/2018   Closed fracture of neck of right femur (  [facility])    Colon cancer ([place]) 09/18/2011   Lung cancer, upper lobe ([place]) 09/18/2011     Janene Harvey, PT, DPT 02/09/21 11:57 AM    [facility] [address]  [place], [place], [address] Phone: [phone]    Fax:  [phone]  Name: Jerome Kelley MRN: [id number] Date of Birth: 05-20-1941

## 2021-02-11 ENCOUNTER — Ambulatory Visit: Payer: Medicare Other | Attending: Hematology & Oncology

## 2021-02-11 ENCOUNTER — Other Ambulatory Visit: Payer: Self-pay

## 2021-02-11 DIAGNOSIS — H8111 Benign paroxysmal vertigo, right ear: Secondary | ICD-10-CM | POA: Diagnosis not present

## 2021-02-11 DIAGNOSIS — R262 Difficulty in walking, not elsewhere classified: Secondary | ICD-10-CM | POA: Diagnosis not present

## 2021-02-11 DIAGNOSIS — R2681 Unsteadiness on feet: Secondary | ICD-10-CM | POA: Insufficient documentation

## 2021-02-11 NOTE — Therapy (Signed)
[place], [place], [address] Phone: [phone]   Fax:  [phone]  Physical Therapy Treatment  Patient Details  Name: Jerome Kelley MRN: [id number] Date of Birth: 12-08-1940 Referring Provider (PT): Burney Gauze, MD   Encounter Date: 02/11/2021   PT End of Session - 02/11/21 1013     Visit Number 3    Number of Visits 13    Date for PT Re-Evaluation 03/15/21    Authorization Type Medicare & [employer]ign[employer]    PT Start Time 0924    PT Stop Time 1008    PT Time Calculation (min) 44 min    Equipment Utilized During Treatment Gait belt    Activity Tolerance Patient tolerated treatment well    Behavior During Therapy [facility] for tasks assessed/performed             Past Medical History:  Diagnosis Date   Colon cancer ([place]) 09/18/2011   Goals of care, counseling/discussion 03/15/2020   History of chemotherapy    History of radiation therapy 11/04/2020-12/15/2020    mediastinal and right hilar region    Dr Gery Pray   Hypertension    Lung cancer, upper lobe ([place]) 09/18/2011   Malignant neoplasm of upper lobe of right lung ([place]) 04/30/2020    Past Surgical History:  Procedure Laterality Date   BRONCHIAL WASHINGS  03/19/2020   Procedure: BRONCHIAL WASHINGS;  Surgeon: Juanito Doom, MD;  Location: [facility];  Service: Cardiopulmonary;;   COLON SURGERY  2010   ENDOBRONCHIAL ULTRASOUND N/A 03/19/2020   Procedure: ENDOBRONCHIAL ULTRASOUND;  Surgeon: Juanito Doom, MD;  [facility];  Service: Cardiopulmonary;  Laterality: N/A;   FINE NEEDLE ASPIRATION  03/19/2020   Procedure: FINE NEEDLE ASPIRATION;  Surgeon: Juanito Doom, MD;  [facility];  Service: Cardiopulmonary;;   FINE NEEDLE ASPIRATION BIOPSY  04/02/2020   Procedure: FINE NEEDLE ASPIRATION BIOPSY;  Surgeon: Laurin Coder, MD;  [facility];  Service: Pulmonary;;   IR IMAGING GUIDED PORT INSERTION   05/07/2020   LUNG SURGERY     right side    port a cath insertion     port a cath removed     TOTAL HIP ARTHROPLASTY Right 10/18/2018   Procedure: TOTAL HIP ARTHROPLASTY ANTERIOR APPROACH;  Surgeon: Mcarthur Rossetti, MD;  [facility];  Service: Orthopedics;  Laterality: Right;   VIDEO BRONCHOSCOPY N/A 03/19/2020   Procedure: VIDEO BRONCHOSCOPY WITHOUT FLUORO;  Surgeon: Juanito Doom, MD;  [facility];  Service: Cardiopulmonary;  Laterality: N/A;   VIDEO BRONCHOSCOPY N/A 04/02/2020   Procedure: VIDEO BRONCHOSCOPY WITH EBUS;  Surgeon: Laurin Coder, MD;  [facility];  Service: Pulmonary;  Laterality: N/A;    There were no vitals filed for this visit.   Subjective Assessment - 02/11/21 0925     Subjective Still reports dizziness but only when he lays down. Notes that he has been using his cane outside but not around the house.    Pertinent History colon CA 2013, HTN, lung CA, R THA 2020    Diagnostic tests none    Patient Stated Goals work on equilibrium    Currently in Pain? No/denies                               [facility] Adult PT Treatment/Exercise - 02/11/21 0001       High Level  Balance   High Level Balance Comments Narrow BOS on airex pad 30 sec no hand support; fwd reaches with narrow BOS on airex 15 sec; weaning through cones and stepping over cones 3x30 ft; tandem walk along counter 3x fwd and back      Exercises   Exercises Knee/Hip      Knee/Hip Exercises: Stretches   Gastroc Stretch Right;Left;30 seconds    Gastroc Stretch Limitations with counter support      Knee/Hip Exercises: Aerobic   Nustep L2x96min      Knee/Hip Exercises: Standing   Heel Raises Both;10 reps    Hip Flexion Stengthening;Both;10 reps;Knee bent    Hip Flexion Limitations counter support; marches    Hip Abduction Stengthening;Both;10 reps;Knee straight    Abduction Limitations 1 hand support    Hip Extension Stengthening;Both;10 reps;Knee  straight    Extension Limitations counter support; pt had difficulty keeping upright posture and LEs straight      Knee/Hip Exercises: Seated   Sit to Sand 10 reps;with UE support;without UE support   5 reps with UE support on knees; 5 reps with arms crossed                   PT Education - 02/11/21 1012     Education Details HEP update; Access Code: [email or web address]    Person(s) Educated Patient    Methods Explanation;Demonstration;Verbal cues;Handout    Comprehension Verbalized understanding;Returned demonstration;Verbal cues required              PT Short Term Goals - 02/09/21 1154       PT SHORT TERM GOAL #1   Title Patient to be independent with initial HEP.    Time 3    Period Weeks    Status On-going    Target Date 02/22/21               PT Long Term Goals - 02/09/21 1154       PT LONG TERM GOAL #1   Title Patient to be independent with advanced HEP.    Time 6    Period Weeks    Status On-going      PT LONG TERM GOAL #2   Title Patient to demonstrate B ankle strength >/=4+/5.    Time 6    Period Weeks    Status On-going      PT LONG TERM GOAL #3   Title Patient to report 0/10 dizziness with bed mobility.    Time 6    Period Weeks    Status On-going      PT LONG TERM GOAL #4   Title Patient to score <15 sec on 5xSTS in order to reduce risk of falls.    Time 6    Period Weeks    Status On-going      PT LONG TERM GOAL #5   Title Patient to score atleast 20/24 on DGI in order to reduce risk of falls.    Time 6    Period Weeks    Status On-going      PT LONG TERM GOAL #6   Title Patient to demonstrate 50% improved heel-toe pattern and step length with ambulation with LRAD.    Time 6    Period Weeks    Status On-going                   Plan - 02/11/21 1016     Clinical Impression Statement Pt reportedly has not been using his  SPC around the house but outside. I reminded him of using it inside as well to increase stability.  With the exercises today he showed good stability but increased difficulty with backwards gait and stepping over obstacles. Updated HEP with exercises focusing on balance and LE strength. Cues required for upright posture and keep good WS through the LEs to faciliate proper strategies of balance and recovery. Also dmeonstrated fatigue with the hip strengthening exercises and required seated breaks for recovery. Pt responded well.    Personal Factors and Comorbidities Age;Comorbidity 3+;Fitness;Past/Current Experience;Time since onset of injury/illness/exacerbation    Comorbidities colon CA 2013, HTN, lung CA, R THA 2020    PT Frequency 2x / week    PT Duration 6 weeks    PT Treatment/Interventions ADLs/Self Care Home Management;Canalith Repostioning;Cryotherapy;Electrical Stimulation;Iontophoresis 4mg /ml Dexamethasone;Moist Heat;Balance training;Therapeutic exercise;Therapeutic activities;Functional mobility training;Stair training;Gait training;DME Instruction;Ultrasound;Neuromuscular re-education;Patient/family education;Manual techniques;Vestibular;Vasopneumatic Device;Taping;Energy conservation;Dry needling;Passive range of motion    PT Next Visit Plan reassess positional testing if dizziness continues; reasses HEP; dynamic balance; hip strengthening    Consulted and Agree with Plan of Care Patient             Patient will benefit from skilled therapeutic intervention in order to improve the following deficits and impairments:  Abnormal gait, Decreased endurance, Increased edema, Cardiopulmonary status limiting activity, Decreased activity tolerance, Decreased strength, Increased fascial restricitons, Pain, Decreased balance, Difficulty walking, Improper body mechanics, Dizziness, Decreased range of motion, Impaired flexibility, Postural dysfunction  Visit Diagnosis: BPPV (benign paroxysmal positional vertigo), right  Unsteadiness on feet  Difficulty in walking, not elsewhere  classified     Problem List Patient Active Problem List   Diagnosis Date Noted   COPD, mild ([place]) 01/24/2021   Port-A-Cath in place 10/29/2020   Malignant neoplasm of upper lobe of right lung ([place]) 04/30/2020   Goals of care, counseling/discussion 03/15/2020   Hip fracture ([place]) 10/18/2018   Essential hypertension 10/18/2018   Closed fracture of neck of right femur ([place])    Colon cancer ([place]) 09/18/2011   Lung cancer, upper lobe ([place]) 09/18/2011    Artist Pais, PTA 02/11/2021, 11:50 AM  [facility] [address]  [place], [place], [address] Phone: [phone]   Fax:  [phone]  Name: OSVALDO LAMPING MRN: [id number] Date of Birth: August 27, 1940

## 2021-02-22 ENCOUNTER — Encounter: Payer: Self-pay | Admitting: Rehabilitative and Restorative Service Providers"

## 2021-02-22 ENCOUNTER — Ambulatory Visit: Payer: Medicare Other | Admitting: Rehabilitative and Restorative Service Providers"

## 2021-02-22 ENCOUNTER — Other Ambulatory Visit: Payer: Self-pay

## 2021-02-22 DIAGNOSIS — R2681 Unsteadiness on feet: Secondary | ICD-10-CM

## 2021-02-22 DIAGNOSIS — R262 Difficulty in walking, not elsewhere classified: Secondary | ICD-10-CM

## 2021-02-22 DIAGNOSIS — H8111 Benign paroxysmal vertigo, right ear: Secondary | ICD-10-CM

## 2021-02-22 NOTE — Therapy (Signed)
[facility] [place] [address], [place], [address] Phone: [phone]   Fax:  [phone]  Physical Therapy Treatment  Patient Details  Name: Jerome Kelley MRN: [id number] Date of Birth: Sep 28, 1940 Referring Provider (PT): Burney Gauze, MD   Encounter Date: 02/22/2021   PT End of Session - 02/22/21 1227     Visit Number 4    Number of Visits 13    Date for PT Re-Evaluation 03/15/21    Authorization Type Medicare & [employer]    PT Start Time 1227    PT Stop Time 1310    PT Time Calculation (min) 43 min    Activity Tolerance Patient tolerated treatment well    Behavior During Therapy [facility] for tasks assessed/performed             Past Medical History:  Diagnosis Date   Colon cancer ([place]) 09/18/2011   Goals of care, counseling/discussion 03/15/2020   History of chemotherapy    History of radiation therapy 11/04/2020-12/15/2020    mediastinal and right hilar region    Dr Gery Pray   Hypertension    Lung cancer, upper lobe ([place]) 09/18/2011   Malignant neoplasm of upper lobe of right lung ([place]) 04/30/2020    Past Surgical History:  Procedure Laterality Date   BRONCHIAL WASHINGS  03/19/2020   Procedure: BRONCHIAL WASHINGS;  Surgeon: Juanito Doom, MD;  Location: [facility];  Service: Cardiopulmonary;;   COLON SURGERY  2010   ENDOBRONCHIAL ULTRASOUND N/A 03/19/2020   Procedure: ENDOBRONCHIAL ULTRASOUND;  Surgeon: Juanito Doom, MD;  Location: [facility];  Service: Cardiopulmonary;  Laterality: N/A;   FINE NEEDLE ASPIRATION  03/19/2020   Procedure: FINE NEEDLE ASPIRATION;  Surgeon: Juanito Doom, MD;  Location: [facility];  Service: Cardiopulmonary;;   FINE NEEDLE ASPIRATION BIOPSY  04/02/2020   Procedure: FINE NEEDLE ASPIRATION BIOPSY;  Surgeon: Laurin Coder, MD;  Location: [facility];  Service: Pulmonary;;   IR IMAGING GUIDED PORT INSERTION  05/07/2020   LUNG SURGERY     right side    port a  cath insertion     port a cath removed     TOTAL HIP ARTHROPLASTY Right 10/18/2018   Procedure: TOTAL HIP ARTHROPLASTY ANTERIOR APPROACH;  Surgeon: Mcarthur Rossetti, MD;  Location: [facility];  Service: Orthopedics;  Laterality: Right;   VIDEO BRONCHOSCOPY N/A 03/19/2020   Procedure: VIDEO BRONCHOSCOPY WITHOUT FLUORO;  Surgeon: Juanito Doom, MD;  Location: [facility];  Service: Cardiopulmonary;  Laterality: N/A;   VIDEO BRONCHOSCOPY N/A 04/02/2020   Procedure: VIDEO BRONCHOSCOPY WITH EBUS;  Surgeon: Laurin Coder, MD;  Location: [facility];  Service: Pulmonary;  Laterality: N/A;    There were no vitals filed for this visit.   Subjective Assessment - 02/22/21 1226     Subjective I didn't get much sleep last night, so I don't feel good.  Pt reports that he is still having dizziness.    Currently in Pain? No/denies                               [facility] Adult PT Treatment/Exercise - 02/22/21 0001       Ambulation/Gait   Ambulation/Gait Yes    Ambulation/Gait Assistance 6: Modified independent (Device/Increase time)    Ambulation Distance (Feet) 180 Feet    Assistive device Straight cane    Gait Pattern Step-through pattern;Poor foot clearance - left;Poor foot  clearance - right    Ambulation Surface Level;Indoor      Knee/Hip Exercises: Aerobic   Nustep L4 x82mn      Knee/Hip Exercises: Standing   Knee Flexion Strengthening;Both;1 set;10 reps    Knee Flexion Limitations 2# HS curl at counter    Hip Flexion Stengthening;Both;1 set;10 reps;Knee straight    Hip Flexion Limitations 2# at counter    Hip Abduction Stengthening;Both;1 set;10 reps    Abduction Limitations 2# at counter    Hip Extension Stengthening;Both;1 set;10 reps;Knee straight    Extension Limitations 2# at counter with cuing for keeping knee straight and maintaining posture    Lateral Step Up Both;1 set;10 reps;Hand Hold: 1;Step Height: 4"    Lateral Step Up Limitations wide step at  window one extender    Forward Step Up Both;1 set;10 reps;Hand Hold: 2;Step Height: 4"    Forward Step Up Limitations wide step at window one extender    Functional Squat 1 set;10 reps      Knee/Hip Exercises: Seated   Sit to Sand 10 reps;without UE support             Vestibular Treatment/Exercise - 02/22/21 0001       Vestibular Treatment/Exercise   Vestibular Treatment Provided Canalith Repositioning    Canalith Repositioning Epley Manuever Right;Epley Manuever Left       EPLEY MANUEVER RIGHT   Number of Reps  2    Overall Response Improved Symptoms    Response Details  good tolerance       EPLEY MANUEVER LEFT   Number of Reps  2    Overall Response  Symptoms Resolved     RESPONSE DETAILS LEFT initial strong torsional nystagmus noted, not present during second Epley maneuver.                     PT Short Term Goals - 02/22/21 1314       PT SHORT TERM GOAL #1   Title Patient to be independent with initial HEP.    Status Partially Met               PT Long Term Goals - 02/09/21 1154       PT LONG TERM GOAL #1   Title Patient to be independent with advanced HEP.    Time 6    Period Weeks    Status On-going      PT LONG TERM GOAL #2   Title Patient to demonstrate B ankle strength >/=4+/5.    Time 6    Period Weeks    Status On-going      PT LONG TERM GOAL #3   Title Patient to report 0/10 dizziness with bed mobility.    Time 6    Period Weeks    Status On-going      PT LONG TERM GOAL #4   Title Patient to score <15 sec on 5xSTS in order to reduce risk of falls.    Time 6    Period Weeks    Status On-going      PT LONG TERM GOAL #5   Title Patient to score atleast 20/24 on DGI in order to reduce risk of falls.    Time 6    Period Weeks    Status On-going      PT LONG TERM GOAL #6   Title Patient to demonstrate 50% improved heel-toe pattern and step length with ambulation with LRAD.    Time 6  Period Weeks    Status On-going                    Plan - 02/22/21 1309     Clinical Impression Statement Pt had minimal dizziness with R Marye Round and proceeded with treatment of Epley Maneuver.  However, with testing of L side, he had a strong positive for nystagmus on [employer] and proceeded with treatment of Epley maneuver x2 with no dizziness reported on second trial. Patient tolerated strengthening portion of session well, but did have fatigue after lateral step up exercise.  He continues to require skilled PT to progress towards goal related activities.    PT Treatment/Interventions ADLs/Self Care Home Management;Canalith Repostioning;Cryotherapy;Electrical Stimulation;Iontophoresis 97m/ml Dexamethasone;Moist Heat;Balance training;Therapeutic exercise;Therapeutic activities;Functional mobility training;Stair training;Gait training;DME Instruction;Ultrasound;Neuromuscular re-education;Patient/family education;Manual techniques;Vestibular;Vasopneumatic Device;Taping;Energy conservation;Dry needling;Passive range of motion    PT Next Visit Plan reassess positional testing if dizziness continues; reasses HEP; dynamic balance; hip strengthening    Consulted and Agree with Plan of Care Patient             Patient will benefit from skilled therapeutic intervention in order to improve the following deficits and impairments:  Abnormal gait, Decreased endurance, Increased edema, Cardiopulmonary status limiting activity, Decreased activity tolerance, Decreased strength, Increased fascial restricitons, Pain, Decreased balance, Difficulty walking, Improper body mechanics, Dizziness, Decreased range of motion, Impaired flexibility, Postural dysfunction  Visit Diagnosis: BPPV (benign paroxysmal positional vertigo), right  Unsteadiness on feet  Difficulty in walking, not elsewhere classified     Problem List Patient Active Problem List   Diagnosis Date Noted   COPD, mild ([place] 01/24/2021   Port-A-Cath in place  10/29/2020   Malignant neoplasm of upper lobe of right lung ([place] 04/30/2020   Goals of care, counseling/discussion 03/15/2020   Hip fracture ([place] 10/18/2018   Essential hypertension 10/18/2018   Closed fracture of neck of right femur ([place]    Colon cancer ([place] 09/18/2011   Lung cancer, upper lobe ([place] 09/18/2011    SJuel Burrow PT, DPT 02/22/2021, 1:15 PM  [facility] [address] [place] [address]Phone: [phone]  Fax:  [phone] Name: Jerome HIGGINSONMRN: [id number] of Birth: 618-Oct-1942

## 2021-02-24 ENCOUNTER — Other Ambulatory Visit: Payer: Self-pay

## 2021-02-24 ENCOUNTER — Ambulatory Visit: Payer: Medicare Other | Admitting: Rehabilitative and Restorative Service Providers"

## 2021-02-24 ENCOUNTER — Encounter: Payer: Self-pay | Admitting: Rehabilitative and Restorative Service Providers"

## 2021-02-24 DIAGNOSIS — R262 Difficulty in walking, not elsewhere classified: Secondary | ICD-10-CM | POA: Diagnosis not present

## 2021-02-24 DIAGNOSIS — H8111 Benign paroxysmal vertigo, right ear: Secondary | ICD-10-CM

## 2021-02-24 DIAGNOSIS — R2681 Unsteadiness on feet: Secondary | ICD-10-CM

## 2021-02-24 NOTE — Therapy (Signed)
[facility] [place], [place], [address] Phone: [phone]   Fax:  [phone]  Physical Therapy Treatment  Patient Details  Name: Jerome Kelley MRN: [id number] Date of Birth: Jan 08, 1941 Referring Provider (PT): Burney Gauze, MD   Encounter Date: 02/24/2021   PT End of Session - 02/24/21 1158     Visit Number 5    Number of Visits 13    Date for PT Re-Evaluation 03/15/21    Authorization Type Medicare & [employer]    PT Start Time 1100    PT Stop Time 1140    PT Time Calculation (min) 40 min    Activity Tolerance Patient tolerated treatment well    Behavior During Therapy [facility] for tasks assessed/performed             Past Medical History:  Diagnosis Date   Colon cancer ([place]) 09/18/2011   Goals of care, counseling/discussion 03/15/2020   History of chemotherapy    History of radiation therapy 11/04/2020-12/15/2020    mediastinal and right hilar region    Dr Gery Pray   Hypertension    Lung cancer, upper lobe ([place]) 09/18/2011   Malignant neoplasm of upper lobe of right lung ([place]) 04/30/2020    Past Surgical History:  Procedure Laterality Date   BRONCHIAL WASHINGS  03/19/2020   Procedure: BRONCHIAL WASHINGS;  Surgeon: Juanito Doom, MD;  Location: [facility];  Service: Cardiopulmonary;;   COLON SURGERY  2010   ENDOBRONCHIAL ULTRASOUND N/A 03/19/2020   Procedure: ENDOBRONCHIAL ULTRASOUND;  Surgeon: Juanito Doom, MD;  [facility];  Service: Cardiopulmonary;  Laterality: N/A;   FINE NEEDLE ASPIRATION  03/19/2020   Procedure: FINE NEEDLE ASPIRATION;  Surgeon: Juanito Doom, MD;  [facility]: [facility];  Service: Cardiopulmonary;;   FINE NEEDLE ASPIRATION BIOPSY  04/02/2020   Procedure: FINE NEEDLE ASPIRATION BIOPSY;  Surgeon: Laurin Coder, MD;  [facility]: [facility];  Service: Pulmonary;;   IR IMAGING GUIDED PORT INSERTION  05/07/2020   LUNG SURGERY     right side    port a  cath insertion     port a cath removed     TOTAL HIP ARTHROPLASTY Right 10/18/2018   Procedure: TOTAL HIP ARTHROPLASTY ANTERIOR APPROACH;  Surgeon: Mcarthur Rossetti, MD;  [facility];  Service: Orthopedics;  Laterality: Right;   VIDEO BRONCHOSCOPY N/A 03/19/2020   Procedure: VIDEO BRONCHOSCOPY WITHOUT FLUORO;  Surgeon: Juanito Doom, MD;  [facility];  Service: Cardiopulmonary;  Laterality: N/A;   VIDEO BRONCHOSCOPY N/A 04/02/2020   Procedure: VIDEO BRONCHOSCOPY WITH EBUS;  Surgeon: Laurin Coder, MD;  [facility]: [facility];  Service: Pulmonary;  Laterality: N/A;    There were no vitals filed for this visit.   Subjective Assessment - 02/24/21 1109     Subjective Pt reports that his dizziness was a little bit better after last treatment, but he still gets a little swimmy headed when he stands up.    Patient Stated Goals work on equilibrium    Currently in Pain? No/denies                     Vestibular Assessment - 02/24/21 0001       Positional Testing   Dix-Hallpike Dix-Hallpike Right;Dix-Hallpike Left      Dix-Hallpike Right   Dix-Hallpike Right Duration negative      Dix-Hallpike Left   Dix-Hallpike Left Duration negative  [place] Adult PT Treatment/Exercise - 02/24/21 0001       Transfers   Five time sit to stand comments  12.3 sec with min UE use      Ambulation/Gait   Ambulation/Gait Yes    Ambulation/Gait Assistance 6: Modified independent (Device/Increase time)    Ambulation Distance (Feet) 200 Feet    Assistive device None    Ambulation Surface Level;Indoor    Stairs Yes    Stairs Assistance 6: Modified independent (Device/Increase time)    Stair Management Technique One rail Right;Alternating pattern    Number of Stairs 10      High Level Balance   High Level Balance Activities Direction changes;Turns;Head turns;Marching forwards;Side stepping    High Level Balance Comments Dynamic gait  activiites out in hallway with SPC.  Standing on [employer] with eyes closed x3 reps.      Knee/Hip Exercises: Aerobic   Recumbent Bike L2 x6 min      Knee/Hip Exercises: Seated   Sit to Sand 10 reps   x10 holding red wt ball, x10 with chest press with red wt ball            Vestibular Treatment/Exercise - 02/24/21 0001       Vestibular Treatment/Exercise   Gaze Exercises X1 Viewing Horizontal;X1 Viewing Vertical      X1 Viewing Horizontal   Foot Position seated    Reps 1    Comments x1 min      X1 Viewing Vertical   Foot Position seated    Reps 1    Comments x1 min                     PT Short Term Goals - 02/24/21 1201       PT SHORT TERM GOAL #1   Title Patient to be independent with initial HEP.    Status Achieved      PT SHORT TERM GOAL #2   Status Achieved               PT Long Term Goals - 02/09/21 1154       PT LONG TERM GOAL #1   Title Patient to be independent with advanced HEP.    Time 6    Period Weeks    Status On-going      PT LONG TERM GOAL #2   Title Patient to demonstrate B ankle strength >/=4+/5.    Time 6    Period Weeks    Status On-going      PT LONG TERM GOAL #3   Title Patient to report 0/10 dizziness with bed mobility.    Time 6    Period Weeks    Status On-going      PT LONG TERM GOAL #4   Title Patient to score <15 sec on 5xSTS in order to reduce risk of falls.    Time 6    Period Weeks    Status On-going      PT LONG TERM GOAL #5   Title Patient to score atleast 20/24 on DGI in order to reduce risk of falls.    Time 6    Period Weeks    Status On-going      PT LONG TERM GOAL #6   Title Patient to demonstrate 50% improved heel-toe pattern and step length with ambulation with LRAD.    Time 6    Period Weeks    Status On-going  Plan - 02/24/21 1158     Clinical Impression Statement Mr Suess presents today with overall decreased complaints of dizziness.  He reports that  he has not had as much dizziness with lying in bed, but has had some with getting up in the morning.  Session today was focused primarily on balance and dynamic gait activities to assist with vestibluar rehab.  He only had mild dizziness with ambulating out in hallway with fast horizontal head turns.  He states that he has been performing his leg exercises at home without difficulty once daily.  He continues to require skilled PT to continue to progress vestibular rehab.    PT Treatment/Interventions ADLs/Self Care Home Management;Canalith Repostioning;Cryotherapy;Electrical Stimulation;Iontophoresis 4mg /ml Dexamethasone;Moist Heat;Balance training;Therapeutic exercise;Therapeutic activities;Functional mobility training;Stair training;Gait training;DME Instruction;Ultrasound;Neuromuscular re-education;Patient/family education;Manual techniques;Vestibular;Vasopneumatic Device;Taping;Energy conservation;Dry needling;Passive range of motion    PT Next Visit Plan reassess positional testing if dizziness continues; reasses HEP; dynamic balance; hip strengthening    Consulted and Agree with Plan of Care Patient             Patient will benefit from skilled therapeutic intervention in order to improve the following deficits and impairments:  Abnormal gait, Decreased endurance, Increased edema, Cardiopulmonary status limiting activity, Decreased activity tolerance, Decreased strength, Increased fascial restricitons, Pain, Decreased balance, Difficulty walking, Improper body mechanics, Dizziness, Decreased range of motion, Impaired flexibility, Postural dysfunction  Visit Diagnosis: BPPV (benign paroxysmal positional vertigo), right  Unsteadiness on feet  Difficulty in walking, not elsewhere classified     Problem List Patient Active Problem List   Diagnosis Date Noted   COPD, mild ([place]) 01/24/2021   Port-A-Cath in place 10/29/2020   Malignant neoplasm of upper lobe of right lung ([place]) 04/30/2020    Goals of care, counseling/discussion 03/15/2020   Hip fracture ([place]) 10/18/2018   Essential hypertension 10/18/2018   Closed fracture of neck of right femur ([place])    Colon cancer ([place]) 09/18/2011   Lung cancer, upper lobe ([place]) 09/18/2011    Juel Burrow, PT, DPT 02/24/2021, 12:04 PM  [facility] [address]  [place], [place], [address] Phone: [phone]   Fax:  [phone]  Name: CAIN FITZHENRY MRN: [id number] Date of Birth: 1940-10-19

## 2021-03-01 ENCOUNTER — Other Ambulatory Visit: Payer: Self-pay

## 2021-03-01 ENCOUNTER — Ambulatory Visit: Payer: Medicare Other

## 2021-03-01 DIAGNOSIS — H8111 Benign paroxysmal vertigo, right ear: Secondary | ICD-10-CM | POA: Diagnosis not present

## 2021-03-01 DIAGNOSIS — R262 Difficulty in walking, not elsewhere classified: Secondary | ICD-10-CM

## 2021-03-01 DIAGNOSIS — R2681 Unsteadiness on feet: Secondary | ICD-10-CM

## 2021-03-01 NOTE — Therapy (Addendum)
[facility] [place], [place], [address] Phone: [phone]   Fax:  [phone]  Physical Therapy Treatment  Patient Details  Name: Jerome Kelley MRN: [id number] Date of Birth: Aug 18, 1940 Referring Provider (PT): Burney Gauze, MD   Encounter Date: 03/01/2021   PT End of Session - 03/01/21 1153     Visit Number 6    Number of Visits 13    Date for PT Re-Evaluation 03/15/21    Authorization Type Medicare & [employer]    PT Start Time 1102    PT Stop Time 1143    PT Time Calculation (min) 41 min    Activity Tolerance Patient tolerated treatment well;Patient limited by fatigue    Behavior During Therapy [facility] for tasks assessed/performed             Past Medical History:  Diagnosis Date   Colon cancer ([place]) 09/18/2011   Goals of care, counseling/discussion 03/15/2020   History of chemotherapy    History of radiation therapy 11/04/2020-12/15/2020    mediastinal and right hilar region    Dr Gery Pray   Hypertension    Lung cancer, upper lobe ([place]) 09/18/2011   Malignant neoplasm of upper lobe of right lung ([place]) 04/30/2020    Past Surgical History:  Procedure Laterality Date   BRONCHIAL WASHINGS  03/19/2020   Procedure: BRONCHIAL WASHINGS;  Surgeon: Juanito Doom, MD;  Location: [facility];  Service: Cardiopulmonary;;   COLON SURGERY  2010   ENDOBRONCHIAL ULTRASOUND N/A 03/19/2020   Procedure: ENDOBRONCHIAL ULTRASOUND;  Surgeon: Juanito Doom, MD;  Location: [facility];  Service: Cardiopulmonary;  Laterality: N/A;   FINE NEEDLE ASPIRATION  03/19/2020   Procedure: FINE NEEDLE ASPIRATION;  Surgeon: Juanito Doom, MD;  Location: [facility];  Service: Cardiopulmonary;;   FINE NEEDLE ASPIRATION BIOPSY  04/02/2020   Procedure: FINE NEEDLE ASPIRATION BIOPSY;  Surgeon: Laurin Coder, MD;  Location: [facility];  Service: Pulmonary;;   IR IMAGING GUIDED PORT INSERTION  05/07/2020   LUNG SURGERY      right side    port a cath insertion     port a cath removed     TOTAL HIP ARTHROPLASTY Right 10/18/2018   Procedure: TOTAL HIP ARTHROPLASTY ANTERIOR APPROACH;  Surgeon: Mcarthur Rossetti, MD;  Location: [facility];  Service: Orthopedics;  Laterality: Right;   VIDEO BRONCHOSCOPY N/A 03/19/2020   Procedure: VIDEO BRONCHOSCOPY WITHOUT FLUORO;  Surgeon: Juanito Doom, MD;  Location: [facility];  Service: Cardiopulmonary;  Laterality: N/A;   VIDEO BRONCHOSCOPY N/A 04/02/2020   Procedure: VIDEO BRONCHOSCOPY WITH EBUS;  Surgeon: Laurin Coder, MD;  Location: [facility];  Service: Pulmonary;  Laterality: N/A;    There were no vitals filed for this visit.   Subjective Assessment - 03/01/21 1112     Subjective Doing good today, not much dizziness.    Pertinent History colon CA 2013, HTN, lung CA, R THA 2020    Diagnostic tests none    Patient Stated Goals work on equilibrium    Currently in Pain? No/denies                               [facility] Adult PT Treatment/Exercise - 03/01/21 0001       Ambulation/Gait   Ambulation/Gait Yes    Ambulation/Gait Assistance 6: Modified independent (Device/Increase time)    Ambulation Distance (Feet) 230 Feet  Assistive device --   SPC   Gait Pattern Step-through pattern;Poor foot clearance - left;Poor foot clearance - right;Decreased stride length    Ambulation Surface Level;Indoor      High Level Balance   High Level Balance Activities Negotitating around obstacles;Negotiating over obstacles    High Level Balance Comments performed with SPC; 3x along the length of counter     Exercises   Exercises Knee/Hip;Ankle      Knee/Hip Exercises: Aerobic   Recumbent Bike L2 x6 min      Knee/Hip Exercises: Standing   Hip Flexion Stengthening;Both;2 sets;10 reps;Knee straight;Knee bent    Hip Flexion Limitations red TB    Functional Squat 10 reps      Ankle Exercises: Seated   Other Seated Ankle Exercises B DF with  yellow TB 10 reps                    PT Education - 03/01/21 1148     Education Details HEP update: Access Code: [email or web address]    Person(s) Educated Patient    Methods Explanation;Demonstration;Tactile cues;Verbal cues;Handout    Comprehension Verbalized understanding;Returned demonstration;Verbal cues required;Tactile cues required              PT Short Term Goals - 02/24/21 1201       PT SHORT TERM GOAL #1   Title Patient to be independent with initial HEP.    Status Achieved      PT SHORT TERM GOAL #2   Status Achieved               PT Long Term Goals - 02/09/21 1154       PT LONG TERM GOAL #1   Title Patient to be independent with advanced HEP.    Time 6    Period Weeks    Status On-going      PT LONG TERM GOAL #2   Title Patient to demonstrate B ankle strength >/=4+/5.    Time 6    Period Weeks    Status On-going      PT LONG TERM GOAL #3   Title Patient to report 0/10 dizziness with bed mobility.    Time 6    Period Weeks    Status On-going      PT LONG TERM GOAL #4   Title Patient to score <15 sec on 5xSTS in order to reduce risk of falls.    Time 6    Period Weeks    Status On-going      PT LONG TERM GOAL #5   Title Patient to score atleast 20/24 on DGI in order to reduce risk of falls.    Time 6    Period Weeks    Status On-going      PT LONG TERM GOAL #6   Title Patient to demonstrate 50% improved heel-toe pattern and step length with ambulation with LRAD.    Time 6    Period Weeks    Status On-going                   Plan - 03/01/21 1158     Clinical Impression Statement Mr Cadenhead reportedly has not been having episodes of dizziness. He does well with dynamic gait exercises but shortened strides were noted with head horizontal head turns. He demonstrated decreased endurance due to LE weakness and low exercise tolerance. During gait he still demonstrates decreased foot clearance so we introduced resisted DF with TB  today. He  would continue to benefit from hip and ankle strenghtening to improve foot clearance and stride length during gait.    Personal Factors and Comorbidities Age;Comorbidity 3+;Fitness;Past/Current Experience;Time since onset of injury/illness/exacerbation    Comorbidities colon CA 2013, HTN, lung CA, R THA 2020    PT Frequency 2x / week    PT Duration 6 weeks    PT Treatment/Interventions ADLs/Self Care Home Management;Canalith Repostioning;Cryotherapy;Electrical Stimulation;Iontophoresis 4mg /ml Dexamethasone;Moist Heat;Balance training;Therapeutic exercise;Therapeutic activities;Functional mobility training;Stair training;Gait training;DME Instruction;Ultrasound;Neuromuscular re-education;Patient/family education;Manual techniques;Vestibular;Vasopneumatic Device;Taping;Energy conservation;Dry needling;Passive range of motion    PT Next Visit Plan reassess positional testing if dizziness continues; reasses HEP; hip and ankle strengthening; dynamic balance;    Consulted and Agree with Plan of Care Patient             Patient will benefit from skilled therapeutic intervention in order to improve the following deficits and impairments:  Abnormal gait, Decreased endurance, Increased edema, Cardiopulmonary status limiting activity, Decreased activity tolerance, Decreased strength, Increased fascial restricitons, Pain, Decreased balance, Difficulty walking, Improper body mechanics, Dizziness, Decreased range of motion, Impaired flexibility, Postural dysfunction  Visit Diagnosis: BPPV (benign paroxysmal positional vertigo), right  Unsteadiness on feet  Difficulty in walking, not elsewhere classified     Problem List Patient Active Problem List   Diagnosis Date Noted   COPD, mild ([place]) 01/24/2021   Port-A-Cath in place 10/29/2020   Malignant neoplasm of upper lobe of right lung ([place]) 04/30/2020   Goals of care, counseling/discussion 03/15/2020   Hip fracture ([place]) 10/18/2018    Essential hypertension 10/18/2018   Closed fracture of neck of right femur ([place])    Colon cancer ([place]) 09/18/2011   Lung cancer, upper lobe ([place]) 09/18/2011    Artist Pais, PTA 03/01/2021, 12:09 PM  [facility] [address]  [place], [place], [address] Phone: [phone]   Fax:  [phone]  Name: ALVERTO SHEDD MRN: [id number] Date of Birth: 10-Oct-1940

## 2021-03-08 ENCOUNTER — Encounter: Payer: Self-pay | Admitting: Physical Therapy

## 2021-03-08 ENCOUNTER — Ambulatory Visit: Payer: Medicare Other | Admitting: Physical Therapy

## 2021-03-08 ENCOUNTER — Other Ambulatory Visit: Payer: Self-pay

## 2021-03-08 VITALS — BP 112/60 | HR 70

## 2021-03-08 DIAGNOSIS — R2681 Unsteadiness on feet: Secondary | ICD-10-CM

## 2021-03-08 DIAGNOSIS — H8111 Benign paroxysmal vertigo, right ear: Secondary | ICD-10-CM

## 2021-03-08 DIAGNOSIS — R262 Difficulty in walking, not elsewhere classified: Secondary | ICD-10-CM

## 2021-03-08 NOTE — Therapy (Signed)
[facility] [place], [place], [address] Phone: [phone]   Fax:  [phone]  Physical Therapy Treatment  Patient Details  Name: Jerome Kelley MRN: [id number] Date of Birth: Nov 02, 1940 Referring Provider (PT): Burney Gauze, MD   Encounter Date: 03/08/2021   PT End of Session - 03/08/21 1144     Visit Number 7    Number of Visits 13    Date for PT Re-Evaluation 03/15/21    Authorization Type Medicare & [employer]    PT Start Time 1058    PT Stop Time 1141    PT Time Calculation (min) 43 min    Equipment Utilized During Treatment Gait belt    Activity Tolerance Patient tolerated treatment well    Behavior During Therapy [facility] for tasks assessed/performed             Past Medical History:  Diagnosis Date   Colon cancer ([place]) 09/18/2011   Goals of care, counseling/discussion 03/15/2020   History of chemotherapy    History of radiation therapy 11/04/2020-12/15/2020    mediastinal and right hilar region    Dr Gery Pray   Hypertension    Lung cancer, upper lobe ([place]) 09/18/2011   Malignant neoplasm of upper lobe of right lung ([place]) 04/30/2020    Past Surgical History:  Procedure Laterality Date   BRONCHIAL WASHINGS  03/19/2020   Procedure: BRONCHIAL WASHINGS;  Surgeon: Juanito Doom, MD;  Location: [facility];  Service: Cardiopulmonary;;   COLON SURGERY  2010   ENDOBRONCHIAL ULTRASOUND N/A 03/19/2020   Procedure: ENDOBRONCHIAL ULTRASOUND;  Surgeon: Juanito Doom, MD;  [facility];  Service: Cardiopulmonary;  Laterality: N/A;   FINE NEEDLE ASPIRATION  03/19/2020   Procedure: FINE NEEDLE ASPIRATION;  Surgeon: Juanito Doom, MD;  [facility];  Service: Cardiopulmonary;;   FINE NEEDLE ASPIRATION BIOPSY  04/02/2020   Procedure: FINE NEEDLE ASPIRATION BIOPSY;  Surgeon: Laurin Coder, MD;  [facility]: [facility];  Service: Pulmonary;;   IR IMAGING GUIDED PORT INSERTION   05/07/2020   LUNG SURGERY     right side    port a cath insertion     port a cath removed     TOTAL HIP ARTHROPLASTY Right 10/18/2018   Procedure: TOTAL HIP ARTHROPLASTY ANTERIOR APPROACH;  Surgeon: Mcarthur Rossetti, MD;  [facility]: [facility];  Service: Orthopedics;  Laterality: Right;   VIDEO BRONCHOSCOPY N/A 03/19/2020   Procedure: VIDEO BRONCHOSCOPY WITHOUT FLUORO;  Surgeon: Juanito Doom, MD;  [facility]: [facility];  Service: Cardiopulmonary;  Laterality: N/A;   VIDEO BRONCHOSCOPY N/A 04/02/2020   Procedure: VIDEO BRONCHOSCOPY WITH EBUS;  Surgeon: Laurin Coder, MD;  [facility];  Service: Pulmonary;  Laterality: N/A;    Vitals:   03/08/21 1059  BP: 112/60  Pulse: 70  SpO2: 98%     Subjective Assessment - 03/08/21 1102     Subjective Sometimes still gets dizzy when he lays down and gets up from bed. Has been using his SPC once in a while when he goes out.    Pertinent History colon CA 2013, HTN, lung CA, R THA 2020    Diagnostic tests none    Patient Stated Goals work on equilibrium    Currently in Pain? No/denies                     Vestibular Assessment - 03/08/21 0001       Dix-Hallpike Right  Dix-Hallpike Right Duration negative    Dix-Hallpike Right Symptoms No nystagmus   c/o "split second" of dizziness     Dix-Hallpike Left   Dix-Hallpike Left Duration negative    Dix-Hallpike Left Symptoms No nystagmus   c/o "split second" of dizziness                     [facility] Adult PT Treatment/Exercise - 03/08/21 0001       Neuro Re-ed    Neuro Re-ed Details  STS standing on foam 10x; gait training while ambulating around clinic searching for cones for gaze stability x 6 min   cues for proper set up and standing tall with STS, cues to increase foot clearance with gait     Knee/Hip Exercises: Aerobic   Recumbent Bike L3 x6 min             Vestibular Treatment/Exercise - 03/08/21 0001       Vestibular Treatment/Exercise    Habituation Exercises Jerome Kelley Daroff   Number of Reps  3    Symptom Description  2x EO each side- mild dizziness upon laying down; 1x EC each side- mild dizziness      X1 Viewing Vertical   Foot Position forward/back stepping with R/L LE + gaze stabilization up/down with CGA    Reps --   10x each foot   Comments mild dizziness with head extension                   PT Education - 03/08/21 1144     Education Details update to HEP    Person(s) Educated Patient    Methods Explanation;Demonstration;Tactile cues;Verbal cues;Handout    Comprehension Verbalized understanding;Returned demonstration              PT Short Term Goals - 02/24/21 1201       PT SHORT TERM GOAL #1   Title Patient to be independent with initial HEP.    Status Achieved      PT SHORT TERM GOAL #2   Status Achieved               PT Long Term Goals - 02/09/21 1154       PT LONG TERM GOAL #1   Title Patient to be independent with advanced HEP.    Time 6    Period Weeks    Status On-going      PT LONG TERM GOAL #2   Title Patient to demonstrate B ankle strength >/=4+/5.    Time 6    Period Weeks    Status On-going      PT LONG TERM GOAL #3   Title Patient to report 0/10 dizziness with bed mobility.    Time 6    Period Weeks    Status On-going      PT LONG TERM GOAL #4   Title Patient to score <15 sec on 5xSTS in order to reduce risk of falls.    Time 6    Period Weeks    Status On-going      PT LONG TERM GOAL #5   Title Patient to score atleast 20/24 on DGI in order to reduce risk of falls.    Time 6    Period Weeks    Status On-going      PT LONG TERM GOAL #6   Title Patient to demonstrate 50% improved heel-toe pattern and step length with ambulation with LRAD.  Time 6    Period Weeks    Status On-going                   Plan - 03/08/21 1145     Clinical Impression Statement Patient arrived to session with report of intermittent  dizziness with bed mobility. Demonstrates improved SPC sequencing and consistency of use today. Re-assessed positional testing with patient reporting "split second" of dizziness but demonstrating no nystagmus. Patient performed Nestor Lewandowsky with c/o mild dizziness in each direction. Believe patient is experiencing residual motion sensitivity rather than BPPV at this time. Thus, updated these exercises into HEP as patient was able to perform them safely today. Worked on progressive dynamic balance activities with addition of gaze stability and compliant surface challenges. Patient required intermittent cueing for form and technique and CGA for safety with these activities. Worked on improving step length and foot clearance with pre-gait and gait activities. Patient tolerated session very well today and without complaints at end of session.    Personal Factors and Comorbidities Age;Comorbidity 3+;Fitness;Past/Current Experience;Time since onset of injury/illness/exacerbation    Comorbidities colon CA 2013, HTN, lung CA, R THA 2020    PT Frequency 2x / week    PT Duration 6 weeks    PT Treatment/Interventions ADLs/Self Care Home Management;Canalith Repostioning;Cryotherapy;Electrical Stimulation;Iontophoresis 4mg /ml Dexamethasone;Moist Heat;Balance training;Therapeutic exercise;Therapeutic activities;Functional mobility training;Stair training;Gait training;DME Instruction;Ultrasound;Neuromuscular re-education;Patient/family education;Manual techniques;Vestibular;Vasopneumatic Device;Taping;Energy conservation;Dry needling;Passive range of motion    PT Next Visit Plan progress habituation and gaze stability training; hip and ankle strengthening; dynamic balance;    Consulted and Agree with Plan of Care Patient             Patient will benefit from skilled therapeutic intervention in order to improve the following deficits and impairments:  Abnormal gait, Decreased endurance, Increased edema,  Cardiopulmonary status limiting activity, Decreased activity tolerance, Decreased strength, Increased fascial restricitons, Pain, Decreased balance, Difficulty walking, Improper body mechanics, Dizziness, Decreased range of motion, Impaired flexibility, Postural dysfunction  Visit Diagnosis: BPPV (benign paroxysmal positional vertigo), right  Unsteadiness on feet  Difficulty in walking, not elsewhere classified     Problem List Patient Active Problem List   Diagnosis Date Noted   COPD, mild ([place]) 01/24/2021   Port-A-Cath in place 10/29/2020   Malignant neoplasm of upper lobe of right lung ([place]) 04/30/2020   Goals of care, counseling/discussion 03/15/2020   Hip fracture ([place]) 10/18/2018   Essential hypertension 10/18/2018   Closed fracture of neck of right femur ([place])    Colon cancer ([place]) 09/18/2011   Lung cancer, upper lobe ([place]) 09/18/2011     Janene Harvey, PT, DPT 03/08/21 11:51 AM    [facility] [address]  [place], [place], [address] Phone: [phone]   Fax:  [phone]  Name: Jerome Kelley MRN: [id number] Date of Birth: 1940/11/21

## 2021-03-10 ENCOUNTER — Ambulatory Visit: Payer: Medicare Other | Admitting: Physical Therapy

## 2021-03-10 ENCOUNTER — Encounter: Payer: Self-pay | Admitting: Physical Therapy

## 2021-03-10 ENCOUNTER — Other Ambulatory Visit: Payer: Self-pay

## 2021-03-10 VITALS — BP 110/55 | HR 78

## 2021-03-10 DIAGNOSIS — R2681 Unsteadiness on feet: Secondary | ICD-10-CM | POA: Diagnosis not present

## 2021-03-10 DIAGNOSIS — H8111 Benign paroxysmal vertigo, right ear: Secondary | ICD-10-CM | POA: Diagnosis not present

## 2021-03-10 DIAGNOSIS — R262 Difficulty in walking, not elsewhere classified: Secondary | ICD-10-CM

## 2021-03-10 NOTE — Therapy (Signed)
[facility] [address]  [place], [place], [address] Phone: [phone]   Fax:  [phone]  Physical Therapy Discharge Summary  Patient Details  Name: Jerome Kelley MRN: [id number] Date of Birth: 06-21-1941 Referring Provider (PT): Burney Gauze, MD   Progress Note Reporting Period 02/01/21 to 03/10/21  See note below for Objective Data and Assessment of Progress/Goals.      Encounter Date: 03/10/2021   PT End of Session - 03/10/21 1038     Visit Number 8    Number of Visits 13    Date for PT Re-Evaluation 03/15/21    Authorization Type Medicare & [employer]    PT Start Time 1005    PT Stop Time 1035    PT Time Calculation (min) 30 min    Equipment Utilized During Treatment Gait belt    Activity Tolerance Patient tolerated treatment well    Behavior During Therapy WFL for tasks assessed/performed             Past Medical History:  Diagnosis Date   Colon cancer ([place]) 09/18/2011   Goals of care, counseling/discussion 03/15/2020   History of chemotherapy    History of radiation therapy 11/04/2020-12/15/2020    mediastinal and right hilar region    Dr Gery Pray   Hypertension    Lung cancer, upper lobe ([employer]) 09/18/2011   Malignant neoplasm of upper lobe of right lung ([place]) 04/30/2020    Past Surgical History:  Procedure Laterality Date   BRONCHIAL WASHINGS  03/19/2020   Procedure: BRONCHIAL WASHINGS;  Surgeon: Juanito Doom, MD;  Location: [facility];  Service: Cardiopulmonary;;   COLON SURGERY  2010   ENDOBRONCHIAL ULTRASOUND N/A 03/19/2020   Procedure: ENDOBRONCHIAL ULTRASOUND;  Surgeon: Juanito Doom, MD;  Location: [facility];  Service: Cardiopulmonary;  Laterality: N/A;   FINE NEEDLE ASPIRATION  03/19/2020   Procedure: FINE NEEDLE ASPIRATION;  Surgeon: Juanito Doom, MD;  Location: [facility];  Service: Cardiopulmonary;;   FINE NEEDLE ASPIRATION BIOPSY  04/02/2020   Procedure: FINE  NEEDLE ASPIRATION BIOPSY;  Surgeon: Laurin Coder, MD;  Location: [facility];  Service: Pulmonary;;   IR IMAGING GUIDED PORT INSERTION  05/07/2020   LUNG SURGERY     right side    port a cath insertion     port a cath removed     TOTAL HIP ARTHROPLASTY Right 10/18/2018   Procedure: TOTAL HIP ARTHROPLASTY ANTERIOR APPROACH;  Surgeon: Mcarthur Rossetti, MD;  Location: [facility];  Service: Orthopedics;  Laterality: Right;   VIDEO BRONCHOSCOPY N/A 03/19/2020   Procedure: VIDEO BRONCHOSCOPY WITHOUT FLUORO;  Surgeon: Juanito Doom, MD;  Location: [facility];  Service: Cardiopulmonary;  Laterality: N/A;   VIDEO BRONCHOSCOPY N/A 04/02/2020   Procedure: VIDEO BRONCHOSCOPY WITH EBUS;  Surgeon: Laurin Coder, MD;  Location: [facility];  Service: Pulmonary;  Laterality: N/A;    Vitals:   03/10/21 1006  BP: (!) 110/55  Pulse: 78  SpO2: 95%     Subjective Assessment - 03/10/21 1009     Subjective Feelig pretty good. Not sure if he is doing better than when he started therapy. Does not feel off balance when he walks but does get out of breath sometimes when he gets up.    Pertinent History colon CA 2013, HTN, lung CA, R THA 2020    Diagnostic tests none    Patient Stated Goals work on equilibrium    Currently in Pain? No/denies  [facility] PT Assessment - 03/10/21 1012       Assessment   Medical Diagnosis B LE weakness    Referring Provider (PT) Burney Gauze, MD    Onset Date/Surgical Date 12/02/20      AROM   Right Ankle Dorsiflexion 16    Left Ankle Dorsiflexion 15      Strength   Right Ankle Dorsiflexion 4+/5    Right Ankle Plantar Flexion 4+/5    Right Ankle Inversion 4+/5    Right Ankle Eversion 4+/5    Left Ankle Dorsiflexion 4+/5    Left Ankle Plantar Flexion 4+/5    Left Ankle Inversion 4+/5    Left Ankle Eversion 4+/5      Standardized Balance Assessment   Five times sit to stand comments  13.20 sec   no UEs     Dynamic Gait Index    Level Surface Normal    Change in Gait Speed Mild Impairment    Gait with Horizontal Head Turns Normal    Gait with Vertical Head Turns Normal    Gait and Pivot Turn Normal    Step Over Obstacle Moderate Impairment    Step Around Obstacles Normal    Steps Normal    Total Score 21                           [facility] Adult PT Treatment/Exercise - 03/10/21 0001       Ambulation/Gait   Ambulation Distance (Feet) 200 Feet    Assistive device Straight cane    Gait Pattern Step-through pattern;Poor foot clearance - left;Poor foot clearance - right;Decreased stride length    Ambulation Surface Level;Indoor    Gait velocity decreased    Gait Comments 25% improvement in foot clearance; able to correct when prompted      Knee/Hip Exercises: Aerobic   Recumbent Bike L4 x6 min                    PT Education - 03/10/21 1037     Education Details discussion on objective progress and remaining impairments; encouraged continued SPC use d/t poor foot clearance with ambulation and educated patient on importance of regular fitness regimen to maintain gains; verbal review of HEP    Person(s) Educated Patient    Methods Explanation;Demonstration;Tactile cues;Verbal cues    Comprehension Verbalized understanding              PT Short Term Goals - 03/10/21 1041       PT SHORT TERM GOAL #1   Title Patient to be independent with initial HEP.    Status Achieved      PT SHORT TERM GOAL #2   Status Achieved               PT Long Term Goals - 03/10/21 1042       PT LONG TERM GOAL #1   Title Patient to be independent with advanced HEP.    Time 6    Period Weeks    Status Achieved      PT LONG TERM GOAL #2   Title Patient to demonstrate B ankle strength >/=4+/5.    Time 6    Period Weeks    Status Achieved      PT LONG TERM GOAL #3   Title Patient to report 0/10 dizziness with bed mobility.    Time 6    Period Weeks    Status Partially Met  reports  65-70% improvement     PT LONG TERM GOAL #4   Title Patient to score <15 sec on 5xSTS in order to reduce risk of falls.    Time 6    Period Weeks    Status Achieved      PT LONG TERM GOAL #5   Title Patient to score atleast 20/24 on DGI in order to reduce risk of falls.    Time 6    Period Weeks    Status Achieved      PT LONG TERM GOAL #6   Title Patient to demonstrate 50% improved heel-toe pattern and step length with ambulation with LRAD.    Time 6    Period Weeks    Status Partially Met   25% improvement                  Plan - 03/10/21 1038     Clinical Impression Statement Patient initially reporting uncertainty about his progress with therapy. Noting no imbalance with ambulation however does report intermittent SOB "when getting up."  Upon discussion, patient reported 65-70% improvement in dizziness with bed mobility. B ankle strength and ROM has improved and is [facility]. Speed and quality of STS transfers has improved with 5xSTS test. Patient has also improved score on DGI to 21/24, demonstrating a decreased risk of falls. Patient still demonstrated consistently poor foot clearance when ambulating, thus encouraged continued SPC use. Patient reported understanding of HEP review and edu on importance of regular fitness regimen to maintain gains. Patient tolerated session well, has demonstrated good progress towards goals, and is ready for DC at this time with transition to HEP.    Personal Factors and Comorbidities Age;Comorbidity 3+;Fitness;Past/Current Experience;Time since onset of injury/illness/exacerbation    Comorbidities colon CA 2013, HTN, lung CA, R THA 2020    PT Frequency 2x / week    PT Duration 6 weeks    PT Treatment/Interventions ADLs/Self Care Home Management;Canalith Repostioning;Cryotherapy;Electrical Stimulation;Iontophoresis 87m/ml Dexamethasone;Moist Heat;Balance training;Therapeutic exercise;Therapeutic activities;Functional mobility training;Stair  training;Gait training;DME Instruction;Ultrasound;Neuromuscular re-education;Patient/family education;Manual techniques;Vestibular;Vasopneumatic Device;Taping;Energy conservation;Dry needling;Passive range of motion    PT Next Visit Plan DC at this time    Consulted and Agree with Plan of Care Patient             Patient will benefit from skilled therapeutic intervention in order to improve the following deficits and impairments:  Abnormal gait, Decreased endurance, Increased edema, Cardiopulmonary status limiting activity, Decreased activity tolerance, Decreased strength, Increased fascial restricitons, Pain, Decreased balance, Difficulty walking, Improper body mechanics, Dizziness, Decreased range of motion, Impaired flexibility, Postural dysfunction  Visit Diagnosis: BPPV (benign paroxysmal positional vertigo), right  Unsteadiness on feet  Difficulty in walking, not elsewhere classified     Problem List Patient Active Problem List   Diagnosis Date Noted   [place]D, mild ([place] 01/24/2021   Port-A-Cath in place 10/29/2020   Malignant ne[place] of [place] lobe of right lung ([facility] 04/30/2020   Goals of care, counseling/discussion 03/15/2020   H[place] ([facility] 10/18/2018   Essential hypertension 10/18/2018   Closed fracture of neck of right femur ([facility]    Col[place] cancer ([facility] 09/18/2011   L[place] cancer, upper lobe ([place] 09/18/2011     PHYSICAL THERAPY DISCHARGE SUMMARY  Visits from Start of Care: 8  Current functional level related to goals / functional outcomes: See above clinical impression   Remaining deficits: Gait deviations, dizziness with bed mobility    Education / Equipment: HEP  Plan: Patient agrees to discharge.  Patient goals were partially met. Patient is being discharged due satisfaction with CLOF.    Janene Harvey, PT, DPT 03/10/21 10:46 AM    [facility] [address]  [place], [place], [address] Phone: [phone]   Fax:  [phone]  Name: Jerome Kelley MRN: [id number] Date of Birth: 05-10-1941

## 2021-03-15 ENCOUNTER — Encounter: Payer: Medicare Other | Admitting: Physical Therapy

## 2021-03-22 ENCOUNTER — Inpatient Hospital Stay: Payer: Medicare Other | Attending: Hematology & Oncology

## 2021-03-22 ENCOUNTER — Other Ambulatory Visit: Payer: Self-pay

## 2021-03-22 VITALS — BP 112/57 | HR 78 | Temp 97.9°F | Resp 18

## 2021-03-22 DIAGNOSIS — Z452 Encounter for adjustment and management of vascular access device: Secondary | ICD-10-CM | POA: Diagnosis not present

## 2021-03-22 DIAGNOSIS — C342 Malignant neoplasm of middle lobe, bronchus or lung: Secondary | ICD-10-CM | POA: Insufficient documentation

## 2021-03-22 DIAGNOSIS — Z95828 Presence of other vascular implants and grafts: Secondary | ICD-10-CM

## 2021-03-22 MED ORDER — SODIUM CHLORIDE 0.9% FLUSH
10.0000 mL | INTRAVENOUS | Status: DC | PRN
  Administered 2021-03-22: 10 mL
  Filled 2021-03-22: qty 10

## 2021-03-22 MED ORDER — HEPARIN SOD (PORK) LOCK FLUSH 100 UNIT/ML IV SOLN
500.0000 [IU] | Freq: Once | INTRAVENOUS | Status: AC | PRN
  Administered 2021-03-22: 500 [IU]
  Filled 2021-03-22: qty 5

## 2021-03-22 NOTE — Patient Instructions (Signed)

## 2021-03-28 ENCOUNTER — Other Ambulatory Visit: Payer: Self-pay | Admitting: Hematology & Oncology

## 2021-04-19 ENCOUNTER — Ambulatory Visit (HOSPITAL_COMMUNITY)
Admission: RE | Admit: 2021-04-19 | Discharge: 2021-04-19 | Disposition: A | Discharge status: Home / Self Care | Payer: Medicare Other | Source: Ambulatory Visit | Attending: Hematology & Oncology | Admitting: Hematology & Oncology

## 2021-04-19 ENCOUNTER — Other Ambulatory Visit: Payer: Self-pay

## 2021-04-19 DIAGNOSIS — R161 Splenomegaly, not elsewhere classified: Secondary | ICD-10-CM | POA: Diagnosis not present

## 2021-04-19 DIAGNOSIS — C3410 Malignant neoplasm of upper lobe, unspecified bronchus or lung: Secondary | ICD-10-CM | POA: Diagnosis not present

## 2021-04-19 DIAGNOSIS — I2510 Atherosclerotic heart disease of native coronary artery without angina pectoris: Secondary | ICD-10-CM | POA: Diagnosis not present

## 2021-04-19 DIAGNOSIS — J439 Emphysema, unspecified: Secondary | ICD-10-CM | POA: Diagnosis not present

## 2021-04-19 DIAGNOSIS — J8410 Pulmonary fibrosis, unspecified: Secondary | ICD-10-CM | POA: Diagnosis not present

## 2021-04-19 DIAGNOSIS — C3490 Malignant neoplasm of unspecified part of unspecified bronchus or lung: Secondary | ICD-10-CM | POA: Diagnosis not present

## 2021-04-19 DIAGNOSIS — I700 Atherosclerosis of aorta: Secondary | ICD-10-CM | POA: Insufficient documentation

## 2021-04-19 DIAGNOSIS — C3411 Malignant neoplasm of upper lobe, right bronchus or lung: Secondary | ICD-10-CM | POA: Insufficient documentation

## 2021-04-19 DIAGNOSIS — J432 Centrilobular emphysema: Secondary | ICD-10-CM | POA: Diagnosis not present

## 2021-04-19 LAB — GLUCOSE, CAPILLARY: Glucose-Capillary: 113 mg/dL — ABNORMAL HIGH (ref 70–99)

## 2021-04-19 IMAGING — PT NM PET TUM IMG RESTAG (PS) SKULL BASE T - THIGH
8 series · 21 of 25 positions shown · non-contrast
Comparison: [DATE]

CLINICAL DATA: Subsequent treatment strategy for non-small cell
lung cancer.

EXAM:
NUCLEAR MEDICINE PET SKULL BASE TO THIGH
TECHNIQUE: 8.05 mCi F-18 FDG was injected intravenously. Full-ring PET imaging
was performed from the skull base to thigh after the radiotracer. CT
data was obtained and used for attenuation correction and anatomic
localization.
Fasting blood glucose: 113 mg/dl

[Series 3: pet sk_thigh ac · axial · 5.0mm · 4.07mm/px · z∈[-584,+380]mm · 4 of 242 slices shown]
[im 1/242]
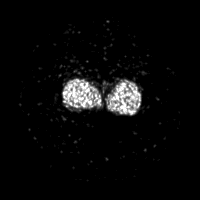
[im 61/242]
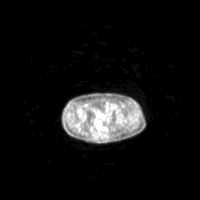
[im 121/242]
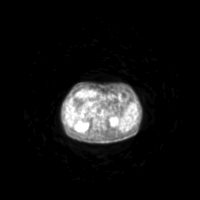
[im 242/242]
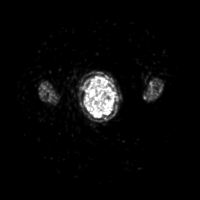

[Series 4: ct sk_thigh 5.0 bf37 · axial · 5.0mm · 0.98mm/px · z∈[-584,+136]mm · 4 of 242 slices shown]
[im 1/242]
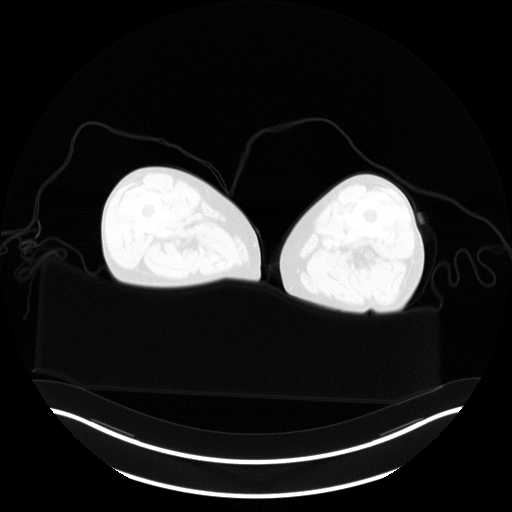
[im 61/242]
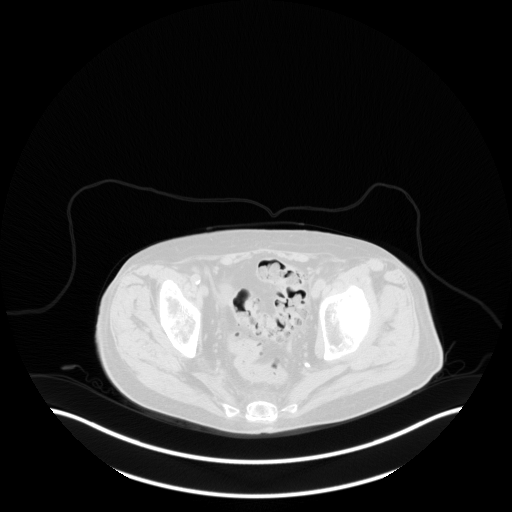
[im 121/242]
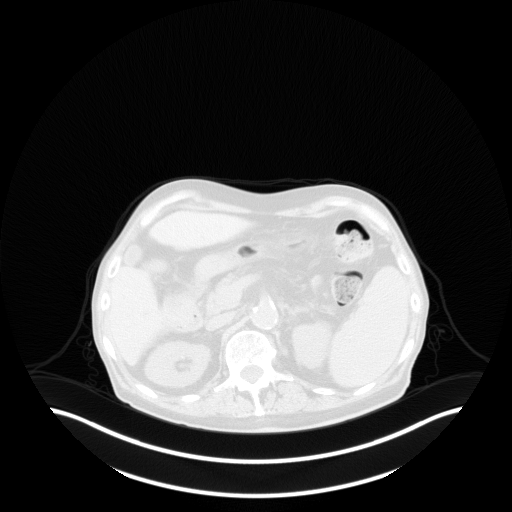
[im 181/242]
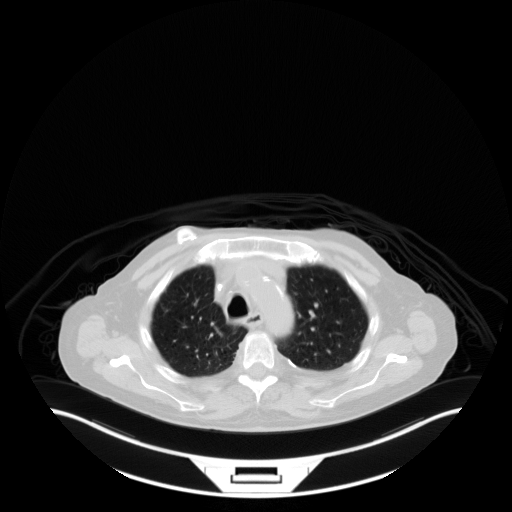

[Series 5: pet sk_thigh nac · axial · 5.0mm · 4.07mm/px · z∈[-584,+380]mm · 5 of 242 slices shown]
[im 1/242]
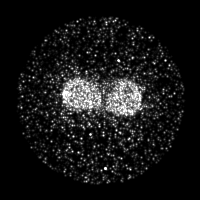
[im 61/242]
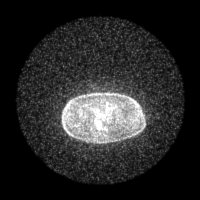
[im 121/242]
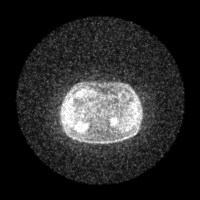
[im 181/242]
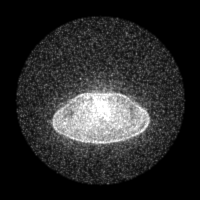
[im 242/242]
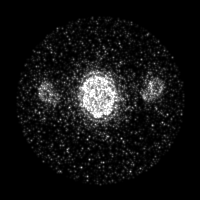

[Series 8: ct sk_thigh 5.0 br59 lung_bone · axial · 5.0mm · 0.68mm/px · 1 of 72 slices shown]
[im 72/72  brain]
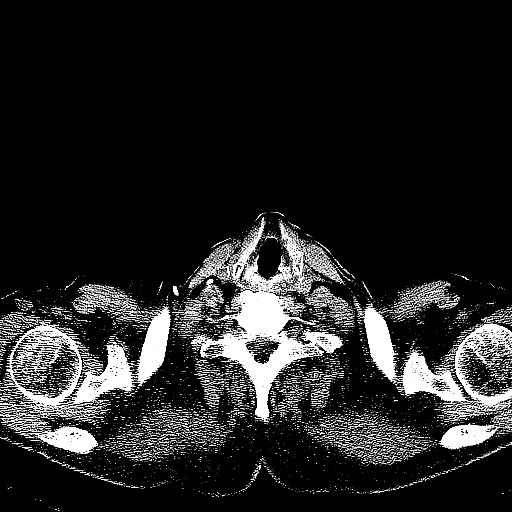

[Series 603: <mip collection> · coronal · 2.00mm/px · 1 of 32 slices shown]
[im 1/32]
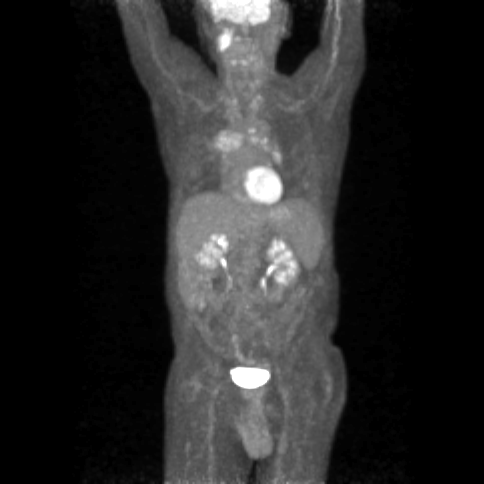

[Series 604: fused cor · 1 of 40 slices shown]
[im 1/40]
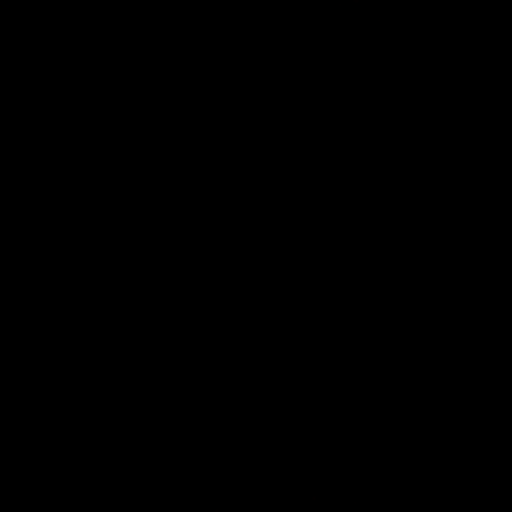

[Series 605: range-ct sk_thigh 5.0 bf37-tra-<alpha range> · 4 of 236 slices shown]
[im 1/236]
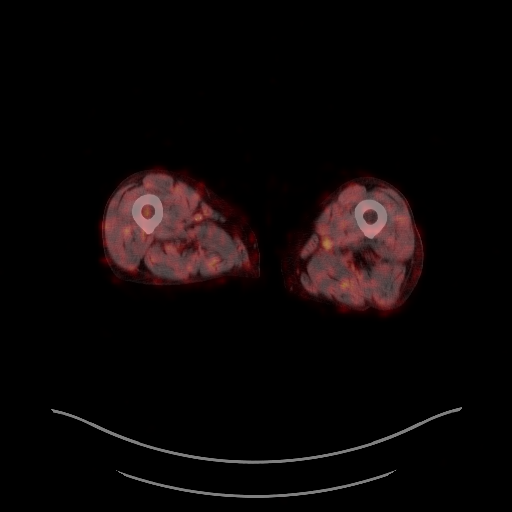
[im 59/236]
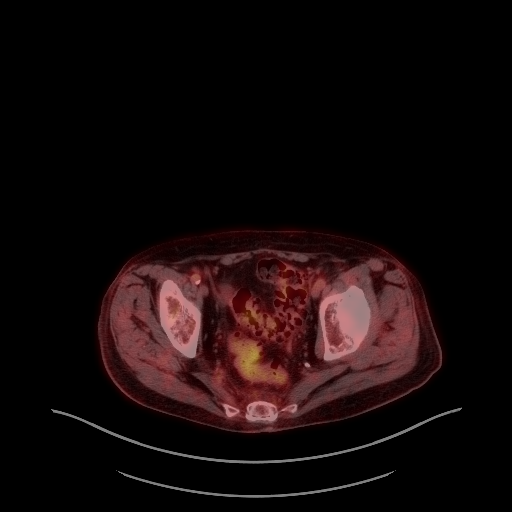
[im 177/236]
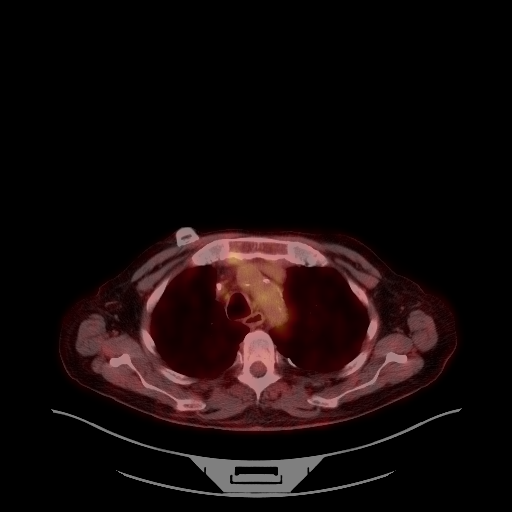
[im 236/236]
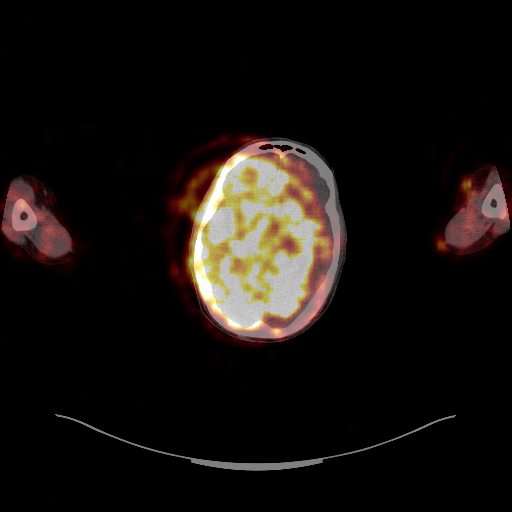

[Series 1753: results mm oncology reading · 1.0mm · 0.50mm/px · 1 of 7 slices shown]
[im 1/7]
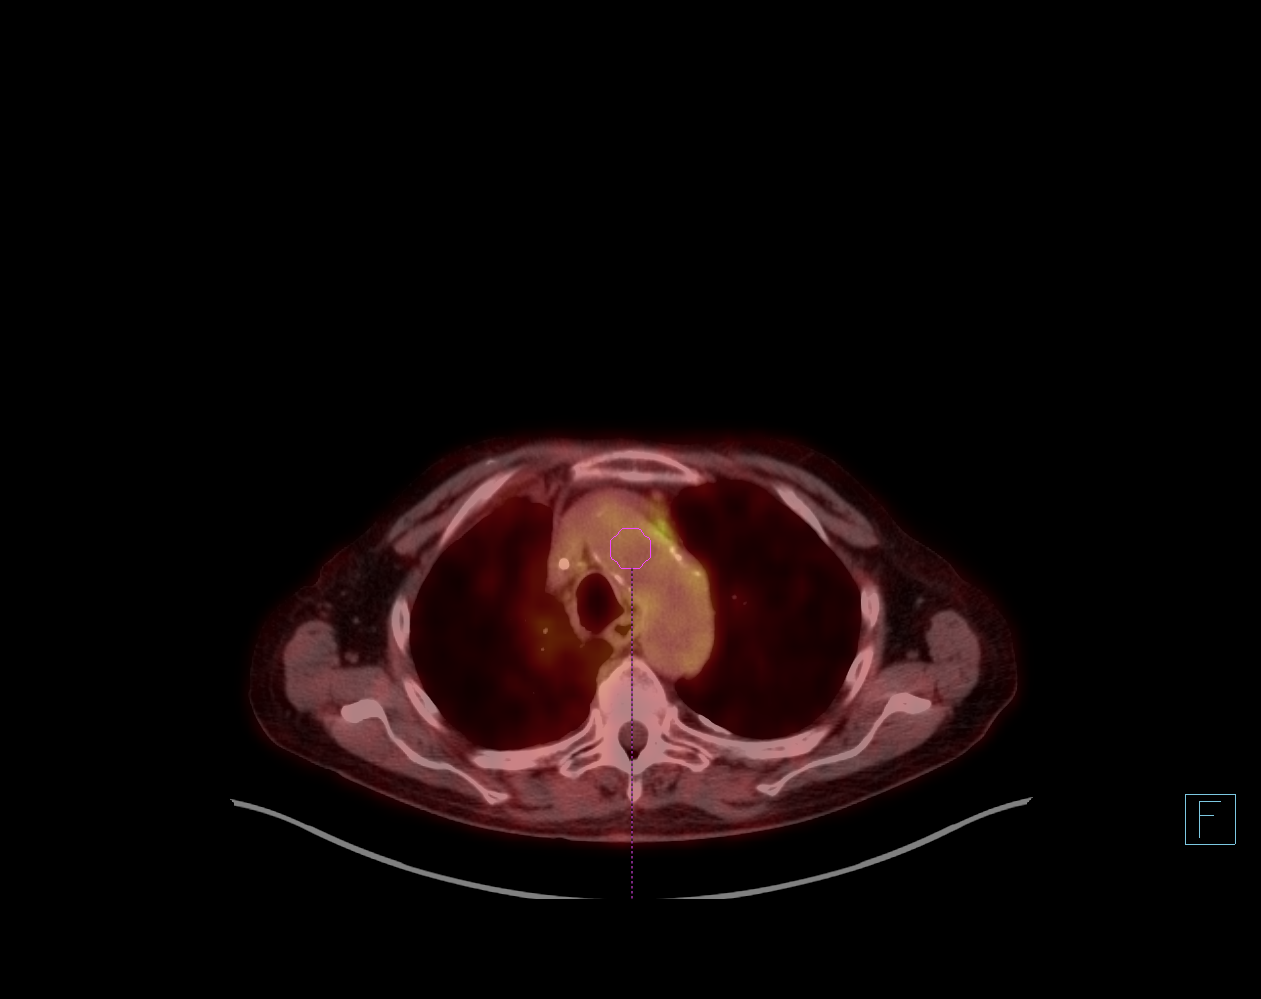

[21 of 25 positions shown; findings below may reference images not displayed]

FINDINGS: Mediastinal blood pool activity: SUV max

Liver activity: SUV max NA

NECK: Physiologic muscular uptake identified within the right-side
of neck. No FDG avid lymph nodes identified.

Incidental CT findings: none

CHEST: Since the previous exam there has been interval development
of paramediastinal and perihilar masslike architectural distortion
and fibrosis within the right lung compatible with changes secondary
to external beam radiation. There is increased FDG uptake throughout
this area which is nonspecific in the setting of external beam
radiation with SUV max of 5.75, image [DATE].

The previous FDG avid nodule within the right middle lobe measures 5
mm on today's study and has an SUV max of 0.97, image 84/4.
Previously this measured 7 mm with SUV max of 0.97.

Persistent FDG avid mediastinal and hilar lymph nodes are noted.
Index right lower paratracheal lymph node measures 1.1 cm with SUV
max of 3.62. Previously this measured 1 cm with SUV max of 7.9.
Small left pre-vascular lymph node measures 4 mm within SUV max of
5.18, image 67/4. Previously 3 mm with SUV max of 5.44. Left hilar
lymph node has an SUV max of 4.64. Previously 3.48. Tiny left lower
paratracheal lymph node has an SUV max of 4.65. Previously 4.34.

Incidental CT findings: Scarring and architectural distortion within
the periphery of the left lower lobe is again noted. Bilateral
pleural calcifications are again noted. Moderate centrilobular
emphysema. Aortic atherosclerosis and coronary artery
calcifications.

ABDOMEN/PELVIS: No abnormal FDG uptake within the liver, pancreas or
adrenal glands. No abnormal uptake within the spleen. No FDG avid
abdominopelvic lymph nodes.

Incidental CT findings: The spleen measures 13.8 by 12.4 by 5.5 cm
(volume = 490 cm^3) No focal splenic lesion. Aortic
atherosclerosis. Postsurgical changes from partial right
hemicolectomy noted.

SKELETON: No focal hypermetabolic activity to suggest skeletal
metastasis.

Incidental CT findings: none
IMPRESSION: 1. Interval development of changes secondary to external beam
radiation within the perihilar and paramediastinal right lung.
Diffusely increased uptake within this area is nonspecific but
likely reflects post radiation inflammatory change.
2. Decrease in size of previous FDG avid nodule in the right middle
lobe which now measures 5 mm. This is compared with 0.9 cm with SUV
max of 4.6 on [DATE].
3. Persistent FDG uptake associated with small calcified and
noncalcified mediastinal and hilar lymph nodes. This is not
significantly changed when compared with [DATE]. As mentioned
previously the persistent FDG uptake may reflect residual
inflammatory changes associated with granulomatous disease. Residual
or recurrent metastatic adenopathy would be difficult to exclude
however.
4. Splenomegaly.
5. Aortic Atherosclerosis ([LP]-[LP]) and Emphysema ([LP]-[LP]).
6. Coronary artery calcifications.

## 2021-04-19 MED ORDER — FLUDEOXYGLUCOSE F - 18 (FDG) INJECTION
8.0500 | Freq: Once | INTRAVENOUS | Status: AC | PRN
  Administered 2021-04-19: 8.05 via INTRAVENOUS

## 2021-04-20 ENCOUNTER — Telehealth: Payer: Self-pay

## 2021-04-20 NOTE — Telephone Encounter (Signed)
-----   Message from [name], MD sent at 04/20/2021  3:05 PM EDT ----- Please call him and tell him that the tumors are still shrinking.  There is nothing that looks like any kind of cancer growth or activity.  Jerome Kelley

## 2021-04-20 NOTE — Telephone Encounter (Signed)
Called and informed patient of PET results, patient verbalized understanding and denies any questions or concerns at this time.

## 2021-04-24 ENCOUNTER — Other Ambulatory Visit: Payer: Self-pay | Admitting: Hematology & Oncology

## 2021-04-24 DIAGNOSIS — C3411 Malignant neoplasm of upper lobe, right bronchus or lung: Secondary | ICD-10-CM

## 2021-04-26 ENCOUNTER — Inpatient Hospital Stay: Payer: Medicare Other | Attending: Hematology & Oncology

## 2021-04-26 ENCOUNTER — Telehealth: Payer: Self-pay

## 2021-04-26 ENCOUNTER — Inpatient Hospital Stay (HOSPITAL_BASED_OUTPATIENT_CLINIC_OR_DEPARTMENT_OTHER): Payer: Medicare Other | Admitting: Hematology & Oncology

## 2021-04-26 ENCOUNTER — Inpatient Hospital Stay: Payer: Medicare Other

## 2021-04-26 ENCOUNTER — Other Ambulatory Visit: Payer: Self-pay

## 2021-04-26 ENCOUNTER — Encounter: Payer: Self-pay | Admitting: Hematology & Oncology

## 2021-04-26 VITALS — BP 125/63 | HR 67 | Temp 97.7°F | Resp 18 | Wt 148.9 lb

## 2021-04-26 DIAGNOSIS — Z85038 Personal history of other malignant neoplasm of large intestine: Secondary | ICD-10-CM | POA: Diagnosis not present

## 2021-04-26 DIAGNOSIS — C342 Malignant neoplasm of middle lobe, bronchus or lung: Secondary | ICD-10-CM | POA: Insufficient documentation

## 2021-04-26 DIAGNOSIS — C184 Malignant neoplasm of transverse colon: Secondary | ICD-10-CM | POA: Diagnosis not present

## 2021-04-26 DIAGNOSIS — D649 Anemia, unspecified: Secondary | ICD-10-CM | POA: Insufficient documentation

## 2021-04-26 DIAGNOSIS — F17210 Nicotine dependence, cigarettes, uncomplicated: Secondary | ICD-10-CM | POA: Insufficient documentation

## 2021-04-26 DIAGNOSIS — C3410 Malignant neoplasm of upper lobe, unspecified bronchus or lung: Secondary | ICD-10-CM | POA: Diagnosis not present

## 2021-04-26 DIAGNOSIS — Z79899 Other long term (current) drug therapy: Secondary | ICD-10-CM | POA: Insufficient documentation

## 2021-04-26 LAB — CMP (CANCER CENTER ONLY)
ALT: 6 U/L (ref 0–44)
AST: 9 U/L — ABNORMAL LOW (ref 15–41)
Albumin: 3.2 g/dL — ABNORMAL LOW (ref 3.5–5.0)
Alkaline Phosphatase: 107 U/L (ref 38–126)
Anion gap: 6 (ref 5–15)
BUN: 13 mg/dL (ref 8–23)
CO2: 29 mmol/L (ref 22–32)
Calcium: 8.8 mg/dL — ABNORMAL LOW (ref 8.9–10.3)
Chloride: 107 mmol/L (ref 98–111)
Creatinine: 0.71 mg/dL (ref 0.61–1.24)
GFR, Estimated: >60 mL/min (ref >60)
Glucose, Bld: 110 mg/dL — ABNORMAL HIGH (ref 70–99)
Potassium: 3.7 mmol/L (ref 3.5–5.1)
Sodium: 142 mmol/L (ref 135–145)
Total Bilirubin: 0.6 mg/dL (ref 0.3–1.2)
Total Protein: 5.7 g/dL — ABNORMAL LOW (ref 6.5–8.1)

## 2021-04-26 LAB — LACTATE DEHYDROGENASE: LDH: 170 U/L (ref 98–192)

## 2021-04-26 LAB — CBC WITH DIFFERENTIAL (CANCER CENTER ONLY)
Abs Immature Granulocytes: 0.02 10*3/uL (ref 0.00–0.07)
Basophils Absolute: 0 10*3/uL (ref 0.0–0.1)
Basophils Relative: 1 %
Eosinophils Absolute: 0.2 10*3/uL (ref 0.0–0.5)
Eosinophils Relative: 6 %
HCT: 32.3 % — ABNORMAL LOW (ref 39.0–52.0)
Hemoglobin: 10.8 g/dL — ABNORMAL LOW (ref 13.0–17.0)
Immature Granulocytes: 1 %
Lymphocytes Relative: 22 %
Lymphs Abs: 0.9 10*3/uL (ref 0.7–4.0)
MCH: 33.3 pg (ref 26.0–34.0)
MCHC: 33.4 g/dL (ref 30.0–36.0)
MCV: 99.7 fL (ref 80.0–100.0)
Monocytes Absolute: 0.4 10*3/uL (ref 0.1–1.0)
Monocytes Relative: 10 %
Neutro Abs: 2.4 10*3/uL (ref 1.7–7.7)
Neutrophils Relative %: 60 %
Platelet Count: 93 10*3/uL — ABNORMAL LOW (ref 150–400)
RBC: 3.24 MIL/uL — ABNORMAL LOW (ref 4.22–5.81)
RDW: 15.3 % (ref 11.5–15.5)
WBC Count: 3.9 10*3/uL — ABNORMAL LOW (ref 4.0–10.5)
nRBC: 0 % (ref 0.0–0.2)

## 2021-04-26 MED ORDER — LORAZEPAM 0.5 MG PO TABS: 0.5000 mg | ORAL_TABLET | Freq: Three times a day (TID) | ORAL | 0 refills | Status: DC | PRN

## 2021-04-26 NOTE — Progress Notes (Signed)
Hematology and Oncology Follow Up Visit  Jerome Kelley [id number] 19-Oct-1940 80 y.o. 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  He is doing pretty well.  He is still smoking.  He probably smokes close to a pack a day.  I do not think he will ever stop smoking.  His wife says that he gets dizzy on occasion.  I am not sure as to why he would get dizzy.  This is not all the time.  He is a little bit anemic.  However, his hemoglobin continues to slowly improve.  His appetite is good.  He has had no nausea or vomiting.  There is no diarrhea.  We did go ahead and do a PET scan on him.  This was done on 04/19/2021.  This showed radiation changes consistent with treatment within the perihilar and paramediastinal right lung.  He had decreased in size of a right middle lobe nodule measuring 5 mm.  There is still persistent but stable uptake and calcified mediastinal and hilar lymph nodes.  He has some splenomegaly.  I told he and his wife that if he continues to smoke, his cancer will come back.  This is an absolute.  I am very confident about this.  I just do not know when it would come back.  He is more active.  He is outside mowing his yard.  Overall, I would have to say his performance status is ECOG 1.     Medications:  Allergies as of 04/26/2021   No Known Allergies      Medication List        Accurate as of April 26, 2021 12:15 PM. If you have any questions, ask your nurse or doctor.          aspirin 81 MG chewable tablet Chew 1  tablet (81 mg total) by mouth 2 (two) times daily. What changed: when to take this   benazepril 20 MG tablet Commonly known as: LOTENSIN Take 20 mg by mouth daily.   dexamethasone 4 MG tablet Commonly known as: DECADRON 1 TAB 2 TIMES A DAY THE DAY BEFORE ALIMTA CHEMO, THEN 2 TABS ONCE A DAY FOR 3 DAYS STARTING THE DAY AFTER CISPLATIN.   dexamethasone 4 MG tablet Commonly known as: DECADRON Take 2 tablets (8 mg total) by mouth daily. Start the day after chemotherapy for 2 days.   folic acid 1 MG tablet Commonly known as: FOLVITE 1 TAB DAILY START 5-7 DAYS BEFORE ALIMTA CHEMOTHERAPY. CONTINUE UNTIL 21 DAYS AFTER COMPLETED.   lidocaine-prilocaine cream Commonly known as: EMLA Apply to affected area once   LORazepam 0.5 MG tablet Commonly known as: ATIVAN TAKE ONE TABLET (0.5 MG) EVERY SIX HOURS AS NEEDED FOR NAUSEA   Melatonin 1 MG Caps Take by mouth.   ondansetron 8 MG tablet Commonly known as: Zofran Take 1 tablet (8 mg total) by mouth 2 (two) times daily as needed for refractory nausea / vomiting. Start on day 3 after chemo.   prochlorperazine 10 MG tablet Commonly known as: COMPAZINE Take 1 tablet (10 mg  total) by mouth every 6 (six) hours as needed (Nausea or vomiting).   sucralfate 1 g tablet Commonly known as: Carafate Take 1 tablet (1 g total) by mouth 4 (four) times daily -  with meals and at bedtime. Crush and dissolve in 10 mL of warm water prior to swallowing   vitamin B-12 1000 MCG tablet Commonly known as: CYANOCOBALAMIN Take 1 tablet (1,000 mcg total) by mouth daily.        Allergies: No Known Allergies  Past Medical History, Surgical history, Social history, and Family History were reviewed and updated.  Review of Systems: Review of Systems  Constitutional: Negative.   HENT: Negative.    Eyes: Negative.   Respiratory: Negative.    Cardiovascular: Negative.   Gastrointestinal: Negative.   Genitourinary: Negative.   Musculoskeletal: Negative.    Skin: Negative.   Neurological:  Positive for dizziness.  Endo/Heme/Allergies: Negative.   Psychiatric/Behavioral: Negative.      Physical Exam:  weight is 148 lb 14 oz (67.5 kg). His oral temperature is 97.7 F (36.5 C). His blood pressure is 125/63 and his pulse is 67. His respiration is 18 and oxygen saturation is 97%.   Wt Readings from Last 3 Encounters:  04/26/21 148 lb 14 oz (67.5 kg)  02/07/21 150 lb 9.6 oz (68.3 kg)  01/24/21 146 lb (66.2 kg)    Physical Exam Vitals reviewed.  HENT:     Head: Normocephalic and atraumatic.  Eyes:     Pupils: Pupils are equal, round, and reactive to light.  Cardiovascular:     Rate and Rhythm: Normal rate and regular rhythm.     Heart sounds: Normal heart sounds.  Pulmonary:     Effort: Pulmonary effort is normal.     Breath sounds: Normal breath sounds.  Abdominal:     General: Bowel sounds are normal.     Palpations: Abdomen is soft.  Musculoskeletal:        General: No tenderness or deformity. Normal range of motion.     Cervical back: Normal range of motion.  Lymphadenopathy:     Cervical: No cervical adenopathy.  Skin:    General: Skin is warm and dry.     Findings: No erythema or rash.  Neurological:     Mental Status: He is alert and oriented to person, place, and time.  Psychiatric:        Behavior: Behavior normal.        Thought Content: Thought content normal.        Judgment: Judgment normal.    Lab Results  Component Value Date   WBC 3.9 (L) 04/26/2021   HGB 10.8 (L) 04/26/2021   HCT 32.3 (L) 04/26/2021   MCV 99.7 04/26/2021   PLT 93 (L) 04/26/2021   Lab Results  Component Value Date   FERRITIN 577 (H) 01/24/2021   IRON 59 01/24/2021   TIBC 180 (L) 01/24/2021   UIBC 121 01/24/2021   IRONPCTSAT 33 01/24/2021   Lab Results  Component Value Date   RBC 3.24 (L) 04/26/2021   No results found for: KPAFRELGTCHN, LAMBDASER, KAPLAMBRATIO No results found for: IGGSERUM, IGA, IGMSERUM No results found  for: Ronnald Ramp, A1GS, A2GS, Violet Baldy, MSPIKE, SPEI   Chemistry      Component Value Date/Time   NA 142 04/26/2021 1102   NA 140 06/28/2011 0950   K 3.7 04/26/2021 1102   K 4.7 06/28/2011 0950   CL 107 04/26/2021 1102   CL 100 06/28/2011 0950  CO2 29 04/26/2021 1102   CO2 29 06/28/2011 0950   BUN 13 04/26/2021 1102   BUN 10 06/28/2011 0950   CREATININE 0.71 04/26/2021 1102   CREATININE 0.7 06/28/2011 0950      Component Value Date/Time   CALCIUM 8.8 (L) 04/26/2021 1102   CALCIUM 9.0 06/28/2011 0950   ALKPHOS 107 04/26/2021 1102   ALKPHOS 110 (H) 12/22/2010 0850   AST 9 (L) 04/26/2021 1102   ALT 6 04/26/2021 1102   ALT 18 12/22/2010 0850   BILITOT 0.6 04/26/2021 1102       Impression and Plan: Mr. Beck is a very pleasant 80 yo caucasian gentleman with previous history of right lung cancer as well as colon cancer 10 years ago. He now has stage IIIb adenocarcinoma of the lung, right middle lobe.   We initially treated him with systemic chemotherapy.  He was not a candidate for surgery.  He was not candidate for curative chemoradiation therapy together.  After systemic chemotherapy, we then relied on radiation and low-dose carboplatinum/Taxol.  I have to had believe that he has responded.  The PET scan is clearly favorable for this.  Again, with him smoking, I am confident that the cancer will come back at some point.  I do think that we can probably get him through all the holidays now.  I think we can probably get him back in January of next year.  We will do a PET scan before we see him back in January..  Still has a Port-A-Cath in.  We will flush this every 2 months.   [name], MD 9/13/202212:15 PM

## 2021-04-26 NOTE — Telephone Encounter (Signed)
Appts made per 04/26/21 los, pt to gain updated sch at [employer]

## 2021-04-26 NOTE — Patient Instructions (Signed)

## 2021-06-29 ENCOUNTER — Other Ambulatory Visit: Payer: Self-pay

## 2021-06-29 ENCOUNTER — Inpatient Hospital Stay: Payer: Medicare Other | Attending: Hematology & Oncology

## 2021-06-29 ENCOUNTER — Other Ambulatory Visit: Payer: Self-pay | Admitting: Hematology & Oncology

## 2021-06-29 DIAGNOSIS — F17210 Nicotine dependence, cigarettes, uncomplicated: Secondary | ICD-10-CM | POA: Insufficient documentation

## 2021-06-29 DIAGNOSIS — C342 Malignant neoplasm of middle lobe, bronchus or lung: Secondary | ICD-10-CM | POA: Insufficient documentation

## 2021-06-29 DIAGNOSIS — Z79899 Other long term (current) drug therapy: Secondary | ICD-10-CM | POA: Diagnosis not present

## 2021-06-29 DIAGNOSIS — Z85038 Personal history of other malignant neoplasm of large intestine: Secondary | ICD-10-CM | POA: Insufficient documentation

## 2021-06-29 DIAGNOSIS — D649 Anemia, unspecified: Secondary | ICD-10-CM | POA: Insufficient documentation

## 2021-06-29 NOTE — Patient Instructions (Signed)

## 2021-08-01 DIAGNOSIS — Z85118 Personal history of other malignant neoplasm of bronchus and lung: Secondary | ICD-10-CM | POA: Diagnosis not present

## 2021-08-01 DIAGNOSIS — F17210 Nicotine dependence, cigarettes, uncomplicated: Secondary | ICD-10-CM | POA: Diagnosis not present

## 2021-08-01 DIAGNOSIS — Z23 Encounter for immunization: Secondary | ICD-10-CM | POA: Diagnosis not present

## 2021-08-01 DIAGNOSIS — J449 Chronic obstructive pulmonary disease, unspecified: Secondary | ICD-10-CM | POA: Diagnosis not present

## 2021-08-01 DIAGNOSIS — I10 Essential (primary) hypertension: Secondary | ICD-10-CM | POA: Diagnosis not present

## 2021-08-31 ENCOUNTER — Ambulatory Visit: Payer: Medicare Other | Admitting: Hematology & Oncology

## 2021-08-31 ENCOUNTER — Other Ambulatory Visit: Payer: Medicare Other

## 2021-08-31 ENCOUNTER — Ambulatory Visit (HOSPITAL_COMMUNITY)
Admission: RE | Admit: 2021-08-31 | Discharge: 2021-08-31 | Disposition: A | Discharge status: Home / Self Care | Payer: Medicare Other | Source: Ambulatory Visit | Attending: Hematology & Oncology | Admitting: Hematology & Oncology

## 2021-08-31 ENCOUNTER — Other Ambulatory Visit: Payer: Self-pay

## 2021-08-31 DIAGNOSIS — Z923 Personal history of irradiation: Secondary | ICD-10-CM | POA: Insufficient documentation

## 2021-08-31 DIAGNOSIS — R590 Localized enlarged lymph nodes: Secondary | ICD-10-CM | POA: Insufficient documentation

## 2021-08-31 DIAGNOSIS — Z9221 Personal history of antineoplastic chemotherapy: Secondary | ICD-10-CM | POA: Diagnosis not present

## 2021-08-31 DIAGNOSIS — C3490 Malignant neoplasm of unspecified part of unspecified bronchus or lung: Secondary | ICD-10-CM | POA: Diagnosis not present

## 2021-08-31 DIAGNOSIS — J439 Emphysema, unspecified: Secondary | ICD-10-CM | POA: Diagnosis not present

## 2021-08-31 DIAGNOSIS — I2510 Atherosclerotic heart disease of native coronary artery without angina pectoris: Secondary | ICD-10-CM | POA: Diagnosis not present

## 2021-08-31 DIAGNOSIS — C3410 Malignant neoplasm of upper lobe, unspecified bronchus or lung: Secondary | ICD-10-CM | POA: Insufficient documentation

## 2021-08-31 DIAGNOSIS — I898 Other specified noninfective disorders of lymphatic vessels and lymph nodes: Secondary | ICD-10-CM | POA: Diagnosis not present

## 2021-08-31 LAB — GLUCOSE, CAPILLARY: Glucose-Capillary: 94 mg/dL (ref 70–99)

## 2021-08-31 IMAGING — CT NM PET TUM IMG RESTAG (PS) SKULL BASE T - THIGH
1 of 9 series · 1 of 25 positions shown · non-contrast
Comparison: Multiple priors most recent [DATE]

CLINICAL DATA: Follow-up treatment strategy for non-small cell lung
cancer.

EXAM:
NUCLEAR MEDICINE PET SKULL BASE TO THIGH
TECHNIQUE: 7.4 mCi F-18 FDG was injected intravenously. Full-ring PET imaging
was performed from the skull base to thigh after the radiotracer. CT
data was obtained and used for attenuation correction and anatomic
localization.
Fasting blood glucose: 94 mg/dl

[Series 4: ct sk_thigh 5.0 bf37 · axial · 5.0mm · 0.98mm/px · 1 of 240 slices shown]
[im 240/240  brain]
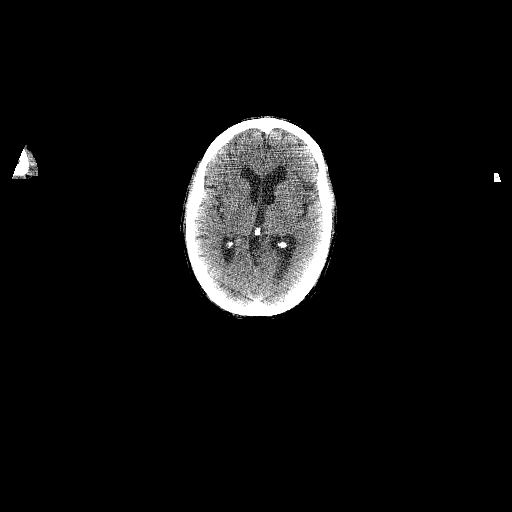

[1 of 25 positions shown; findings below may reference images not displayed]

FINDINGS: Mediastinal blood pool activity: SUV max

Liver activity: SUV max

NECK: No hypermetabolic lymph nodes in the neck.

Incidental CT findings: none

CHEST: Unchanged FDG avid mediastinal and hilar lymph nodes. Index
right lower paratracheal lymph node located on image 73 measures
cm in short axis, unchanged in size compared to prior exam.
Reference left pre-vascular lymph node measures 5 mm on image 68,
unchanged in size when compared with prior exam. Interval increased
size of solid right middle lobe pulmonary nodule measuring 6 mm in
mean diameter on series 8, image 47, previously measured 4 mm in
mean diameter, SUV max 2.3, previously 0.97. Stable small right
lower lobe pulmonary nodule measuring 4 mm in mean diameter on image
50, no significant FDG uptake. Unchanged FDG avid right
paramediastinal linear consolidation with associated traction
bronchiectasis, compatible with postradiation change. No areas of
focal FDG uptake to suggest recurrent disease.

Incidental CT findings: Left main and three-vessel coronary artery
calcifications. Atherosclerotic disease the thoracic aorta unchanged
focal outpouching of the aortic arch. Calcified mediastinal and
hilar lymph nodes. Emphysema. Unchanged scarring and architectural
distortion of the peripheral left lower lobe. Calcified pleural
plaques of the left lung which are likely due to prior hemothorax or
empyema.

ABDOMEN/PELVIS: No abnormal hypermetabolic activity within the
liver, pancreas, adrenal glands, or spleen. Increased size of
retroperitoneal lymph nodes with new hypermetabolic FDG uptake.
Reference left periaortic lymph node located on series 4, image 127
measuring 0.9 cm in short axis with SUV max of 6.4, previously
measured 0.4 cm in short axis. Reference right periaortic lymph node
measuring 0.9 cm in short axis on series 4, image 130 an SUV max of
5.5, previously measured 0.4 cm.

Incidental CT findings: Unchanged splenomegaly. Cholecystectomy
clips. Bilateral renal vascular calcifications. Atherosclerotic
disease of the abdominal aorta. Diverticulosis with no evidence of
diverticulitis.

SKELETON: No focal hypermetabolic activity to suggest skeletal
metastasis.

Incidental CT findings: Prior right hip replacement.
IMPRESSION: 1. Retroperitoneal lymph nodes are increased in size with new
hypermetabolic activity, concerning for metastatic disease.
2. Increased size and FDG activity of subcentimeter right middle
lobe pulmonary nodule, concerning for additional site of progressive
disease.

3.  Stable right paramediastinal post radiation change.
4. Persistent FDG uptake associated with calcified and noncalcified
mediastinal hilar lymph nodes, favor residual inflammatory changes
associated granulomatous disease.

## 2021-08-31 MED ORDER — FLUDEOXYGLUCOSE F - 18 (FDG) INJECTION
7.3600 | Freq: Once | INTRAVENOUS | Status: AC
  Administered 2021-08-31: 7.36 via INTRAVENOUS

## 2021-09-01 ENCOUNTER — Other Ambulatory Visit: Payer: Self-pay | Admitting: Hematology & Oncology

## 2021-09-02 ENCOUNTER — Encounter: Payer: Self-pay | Admitting: Hematology & Oncology

## 2021-09-07 ENCOUNTER — Other Ambulatory Visit: Payer: Self-pay

## 2021-09-07 ENCOUNTER — Inpatient Hospital Stay: Payer: Medicare Other

## 2021-09-07 ENCOUNTER — Encounter: Payer: Self-pay | Admitting: Hematology & Oncology

## 2021-09-07 ENCOUNTER — Inpatient Hospital Stay: Payer: Medicare Other | Attending: Hematology & Oncology

## 2021-09-07 ENCOUNTER — Inpatient Hospital Stay (HOSPITAL_BASED_OUTPATIENT_CLINIC_OR_DEPARTMENT_OTHER): Payer: Medicare Other | Admitting: Hematology & Oncology

## 2021-09-07 VITALS — BP 166/66 | HR 74 | Temp 97.5°F | Resp 18 | Wt 157.0 lb

## 2021-09-07 DIAGNOSIS — C772 Secondary and unspecified malignant neoplasm of intra-abdominal lymph nodes: Secondary | ICD-10-CM | POA: Insufficient documentation

## 2021-09-07 DIAGNOSIS — C3411 Malignant neoplasm of upper lobe, right bronchus or lung: Secondary | ICD-10-CM | POA: Diagnosis not present

## 2021-09-07 DIAGNOSIS — C3410 Malignant neoplasm of upper lobe, unspecified bronchus or lung: Secondary | ICD-10-CM

## 2021-09-07 DIAGNOSIS — E039 Hypothyroidism, unspecified: Secondary | ICD-10-CM

## 2021-09-07 DIAGNOSIS — Z7982 Long term (current) use of aspirin: Secondary | ICD-10-CM | POA: Diagnosis not present

## 2021-09-07 DIAGNOSIS — C342 Malignant neoplasm of middle lobe, bronchus or lung: Secondary | ICD-10-CM | POA: Diagnosis not present

## 2021-09-07 DIAGNOSIS — Z902 Acquired absence of lung [part of]: Secondary | ICD-10-CM | POA: Diagnosis not present

## 2021-09-07 DIAGNOSIS — Z85038 Personal history of other malignant neoplasm of large intestine: Secondary | ICD-10-CM | POA: Insufficient documentation

## 2021-09-07 DIAGNOSIS — Z95828 Presence of other vascular implants and grafts: Secondary | ICD-10-CM

## 2021-09-07 DIAGNOSIS — Z79899 Other long term (current) drug therapy: Secondary | ICD-10-CM | POA: Diagnosis not present

## 2021-09-07 DIAGNOSIS — C184 Malignant neoplasm of transverse colon: Secondary | ICD-10-CM

## 2021-09-07 DIAGNOSIS — F17200 Nicotine dependence, unspecified, uncomplicated: Secondary | ICD-10-CM | POA: Insufficient documentation

## 2021-09-07 LAB — CBC WITH DIFFERENTIAL (CANCER CENTER ONLY)
Abs Immature Granulocytes: 0.03 10*3/uL (ref 0.00–0.07)
Basophils Absolute: 0 10*3/uL (ref 0.0–0.1)
Basophils Relative: 1 %
Eosinophils Absolute: 0.1 10*3/uL (ref 0.0–0.5)
Eosinophils Relative: 3 %
HCT: 33.4 % — ABNORMAL LOW (ref 39.0–52.0)
Hemoglobin: 11.3 g/dL — ABNORMAL LOW (ref 13.0–17.0)
Immature Granulocytes: 1 %
Lymphocytes Relative: 19 %
Lymphs Abs: 0.8 10*3/uL (ref 0.7–4.0)
MCH: 32.8 pg (ref 26.0–34.0)
MCHC: 33.8 g/dL (ref 30.0–36.0)
MCV: 97.1 fL (ref 80.0–100.0)
Monocytes Absolute: 0.6 10*3/uL (ref 0.1–1.0)
Monocytes Relative: 13 %
Neutro Abs: 2.9 10*3/uL (ref 1.7–7.7)
Neutrophils Relative %: 63 %
Platelet Count: 86 10*3/uL — ABNORMAL LOW (ref 150–400)
RBC: 3.44 MIL/uL — ABNORMAL LOW (ref 4.22–5.81)
RDW: 14.6 % (ref 11.5–15.5)
WBC Count: 4.4 10*3/uL (ref 4.0–10.5)
nRBC: 0 % (ref 0.0–0.2)

## 2021-09-07 LAB — CMP (CANCER CENTER ONLY)
ALT: 8 U/L (ref 0–44)
AST: 9 U/L — ABNORMAL LOW (ref 15–41)
Albumin: 3.5 g/dL (ref 3.5–5.0)
Alkaline Phosphatase: 126 U/L (ref 38–126)
Anion gap: 5 (ref 5–15)
BUN: 13 mg/dL (ref 8–23)
CO2: 28 mmol/L (ref 22–32)
Calcium: 9.1 mg/dL (ref 8.9–10.3)
Chloride: 106 mmol/L (ref 98–111)
Creatinine: 0.65 mg/dL (ref 0.61–1.24)
GFR, Estimated: >60 mL/min (ref >60)
Glucose, Bld: 110 mg/dL — ABNORMAL HIGH (ref 70–99)
Potassium: 3.7 mmol/L (ref 3.5–5.1)
Sodium: 139 mmol/L (ref 135–145)
Total Bilirubin: 0.7 mg/dL (ref 0.3–1.2)
Total Protein: 6 g/dL — ABNORMAL LOW (ref 6.5–8.1)

## 2021-09-07 LAB — RETICULOCYTES
Immature Retic Fract: 14.6 % (ref 2.3–15.9)
RBC.: 3.41 MIL/uL — ABNORMAL LOW (ref 4.22–5.81)
Retic Count, Absolute: 122.4 K/uL (ref 19.0–186.0)
Retic Ct Pct: 3.6 % — ABNORMAL HIGH (ref 0.4–3.1)

## 2021-09-07 LAB — LACTATE DEHYDROGENASE: LDH: 181 U/L (ref 98–192)

## 2021-09-07 MED ORDER — SODIUM CHLORIDE 0.9% FLUSH
10.0000 mL | INTRAVENOUS | Status: DC | PRN
  Administered 2021-09-07: 11:00:00 10 mL

## 2021-09-07 MED ORDER — HEPARIN SOD (PORK) LOCK FLUSH 100 UNIT/ML IV SOLN
500.0000 [IU] | Freq: Once | INTRAVENOUS | Status: AC | PRN
  Administered 2021-09-07: 11:00:00 500 [IU]

## 2021-09-07 NOTE — Patient Instructions (Signed)

## 2021-09-07 NOTE — Progress Notes (Signed)
Hematology and Oncology Follow Up Visit  MOHAMED PORTLOCK [id number] [birth year] 81 y.o. 09/07/2021   Principle Diagnosis:  Stage IIIB ([id number]) adenocarcinoma of the right middle lung History of stage Ia (T1aN0M0) adenocarcinoma the right lung - resected in March 2011 History of stage II adenocarcinoma of the colon-high risk - April 2010 Metastatic adenocarcinoma of the lung --retroperitoneal nodal metastasis and new left lung nodule  --high PD-L1   Current Therapy:        Carboplatin/Alimta/Pembrolizumab - started 05/10/2020- s/p cycle 6 Radiation with weekly carboplatin/Taxol-start on 11/01/2020 -- completed on 12/20/2020 Yervoy/Opdivo --start cycle #1 on 09/15/2021    Interim History:  Mr. Schliep is here today for follow-up.  Unfortunately, he now has metastatic lung cancer.  We did go ahead and do a PET scan on him.  This was done last week.  He now has new retroperitoneal lymph nodes.  He has a new lung nodule in the left lung.  Unfortunately, he is still smoking.  I do still think he will ever stop smoking.  His tumor does have a high PD-L1 level.  As such, I think we might be able to utilize immunotherapy on him.  He really has had no symptoms.  He is eating well.  He did fall a week or so ago.  Hurt his lower back.  It is quite stiff.  He is putting on some over-the-counter topical ointment.  He has had no change in bowel or bladder habits.  He has had no cough.  He has had no shortness of breath.  Overall, I would say his performance status is ECOG 1.   He is more active.  He is outside mowing his yard.  Medications:  Allergies as of 09/07/2021   No Known Allergies      Medication List        Accurate as of September 07, 2021 12:21 PM. If you have any questions, ask your nurse or doctor.          STOP taking these medications    dexamethasone 4 MG tablet Commonly known as: DECADRON Stopped by: [name], MD   folic acid 1 MG tablet Commonly known as:  FOLVITE Stopped by: [name], MD   LORazepam 0.5 MG tablet Commonly known as: ATIVAN Stopped by: [name], MD   Melatonin 1 MG Caps Stopped by: [name], MD   ondansetron 8 MG tablet Commonly known as: Zofran Stopped by: [name], MD   prochlorperazine 10 MG tablet Commonly known as: COMPAZINE Stopped by: [name], MD   sucralfate 1 g tablet Commonly known as: Carafate Stopped by: [name], MD       TAKE these medications    aspirin 81 MG chewable tablet Chew 1 tablet (81 mg total) by mouth 2 (two) times daily. What changed: when to take this   benazepril 20 MG tablet Commonly known as: LOTENSIN Take 20 mg by mouth daily.   lidocaine-prilocaine cream Commonly known as: EMLA Apply to affected area once   vitamin B-12 1000 MCG tablet Commonly known as: CYANOCOBALAMIN Take 1 tablet (1,000 mcg total) by mouth daily.        Allergies: No Known Allergies  Past Medical History, Surgical history, Social history, and Family History were reviewed and updated.  Review of Systems: Review of Systems  Constitutional: Negative.   HENT: Negative.    Eyes: Negative.   Respiratory: Negative.    Cardiovascular: Negative.   Gastrointestinal:  Negative.   Genitourinary: Negative.   Musculoskeletal: Negative.   Skin: Negative.   Neurological:  Positive for dizziness.  Endo/Heme/Allergies: Negative.   Psychiatric/Behavioral: Negative.      Physical Exam:  vitals were not taken for this visit.   Wt Readings from Last 3 Encounters:  09/07/21 157 lb (71.2 kg)  04/26/21 148 lb 14 oz (67.5 kg)  02/07/21 150 lb 9.6 oz (68.3 kg)    Physical Exam Vitals reviewed.  HENT:     Head: Normocephalic and atraumatic.  Eyes:     Pupils: Pupils are equal, round, and reactive to light.  Cardiovascular:     Rate and Rhythm: Normal rate and regular rhythm.     Heart sounds: Normal heart sounds.  Pulmonary:     Effort: Pulmonary effort  is normal.     Breath sounds: Normal breath sounds.  Abdominal:     General: Bowel sounds are normal.     Palpations: Abdomen is soft.  Musculoskeletal:        General: No tenderness or deformity. Normal range of motion.     Cervical back: Normal range of motion.  Lymphadenopathy:     Cervical: No cervical adenopathy.  Skin:    General: Skin is warm and dry.     Findings: No erythema or rash.  Neurological:     Mental Status: He is alert and oriented to person, place, and time.  Psychiatric:        Behavior: Behavior normal.        Thought Content: Thought content normal.        Judgment: Judgment normal.    Lab Results  Component Value Date   WBC 4.4 09/07/2021   HGB 11.3 (L) 09/07/2021   HCT 33.4 (L) 09/07/2021   MCV 97.1 09/07/2021   PLT 86 (L) 09/07/2021   Lab Results  Component Value Date   FERRITIN 577 (H) 01/24/2021   IRON 59 01/24/2021   TIBC 180 (L) 01/24/2021   UIBC 121 01/24/2021   IRONPCTSAT 33 01/24/2021   Lab Results  Component Value Date   RETICCTPCT 3.6 (H) 09/07/2021   RBC 3.44 (L) 09/07/2021   No results found for: KPAFRELGTCHN, LAMBDASER, KAPLAMBRATIO No results found for: IGGSERUM, IGA, IGMSERUM No results found for: Odetta Pink, SPEI   Chemistry      Component Value Date/Time   NA 139 09/07/2021 1128   NA 140 06/28/2011 0950   K 3.7 09/07/2021 1128   K 4.7 06/28/2011 0950   CL 106 09/07/2021 1128   CL 100 06/28/2011 0950   CO2 28 09/07/2021 1128   CO2 29 06/28/2011 0950   BUN 13 09/07/2021 1128   BUN 10 06/28/2011 0950   CREATININE 0.65 09/07/2021 1128   CREATININE 0.7 06/28/2011 0950      Component Value Date/Time   CALCIUM 9.1 09/07/2021 1128   CALCIUM 9.0 06/28/2011 0950   ALKPHOS 126 09/07/2021 1128   ALKPHOS 110 (H) 12/22/2010 0850   AST 9 (L) 09/07/2021 1128   ALT 8 09/07/2021 1128   ALT 18 12/22/2010 0850   BILITOT 0.7 09/07/2021 1128       Impression and Plan:  Mr. Weirauch is a very pleasant 81 yo caucasian gentleman with previous history of right lung cancer as well as colon cancer 10 years ago. He now has stage IIIb adenocarcinoma of the lung, right middle lobe.   We initially treated him with systemic chemotherapy.  He was not a  candidate for surgery.  He was not candidate for curative chemoradiation therapy together.  After systemic chemotherapy, we then relied on radiation and low-dose carboplatinum/Taxol.   Unfortunately, his disease likes to come back.  I just hate that it is back.  I know that were not going to cure this.  I talked to he and his wife about this.  Our goal is his quality of life.  I think that with immunotherapy, we can certainly achieve quality of life for him.  I went over the side effects of immunotherapy.  I told him about diarrhea.  I told he and his wife that they must call us as soon as he has diarrhea.  We have also talked to them about the fact that his thyroid could go out on him.  We will check his thyroid whenever we see him.  Also explained the possibility of hepatic inflammation.  I explained the possibility of a skin rash.  He understands all this.  He agrees to go ahead with treatment.  I just wish he would stop smoking.  I talked to him about smoking cessation.  Again is not interested in stopping to smoke.  He understands that this will certainly make treatment a lot more difficult to work.  We will try to get treatment started in a week.  I would do 4 weeks of combined immunotherapy and then repeat his PET scan.  I will plan to see him back when he has his second cycle of immunotherapy.   [name], MD 1/25/202312:21 PM

## 2021-09-07 NOTE — Progress Notes (Signed)
DISCONTINUE OFF PATHWAY REGIMEN - Non-Small Cell Lung   [id number]:Carboplatin + Paclitaxel (2/50) + RT weekly x 6 weeks:   Administer weekly during RT:     Paclitaxel      Carboplatin   **Always confirm dose/schedule in your pharmacy ordering system**  REASON: Disease Progression PRIOR TREATMENT: Off Pathway: Carboplatin + Paclitaxel (2/50) + RT weekly x 6 weeks TREATMENT RESPONSE: Complete Response (CR)  START OFF PATHWAY REGIMEN - Non-Small Cell Lung   [id number]:Ipilimumab 1 mg/kg + Nivolumab 3 mg/kg q21 Days x 4 Cycles Followed by Nivolumab 480 mg q28 Days:   A cycle is every 21 days:     Nivolumab      Ipilimumab    A cycle is every 28 days:     Nivolumab   **Always confirm dose/schedule in your pharmacy ordering system**  Patient Characteristics: Stage IV Metastatic, Nonsquamous, Molecular Analysis Completed, Molecular Alteration Present and Targeted Therapy Exhausted OR EGFR Exon 20+ or KRAS G12C+ or HER2+ Present and No Prior Chemo/Immunotherapy OR No Alteration Present, Second Line -  Chemotherapy/Immunotherapy, PS = 2, No Prior PD-1/PD-L1  Inhibitor and Immunotherapy Candidate Therapeutic Status: Stage IV Metastatic Histology: Nonsquamous Cell Broad Molecular Profiling Status: Engineer, manufacturing Analysis Results: No Alteration Present ECOG Performance Status: 2 Chemotherapy/Immunotherapy Line of Therapy: Second Line Chemotherapy/Immunotherapy Immunotherapy Candidate Status: Candidate for Immunotherapy Prior Immunotherapy Status: No Prior PD-1/PD-L1 Inhibitor Intent of Therapy: Non-Curative / Palliative Intent, Discussed with Patient

## 2021-09-08 LAB — FERRITIN: Ferritin: 213 ng/mL (ref 24–336)

## 2021-09-08 LAB — IRON AND IRON BINDING CAPACITY (CC-WL,HP ONLY)
Iron: 41 ug/dL — ABNORMAL LOW (ref 45–182)
Saturation Ratios: 16 % — ABNORMAL LOW (ref 17.9–39.5)
TIBC: 256 ug/dL (ref 250–450)
UIBC: 215 ug/dL (ref 117–376)

## 2021-09-09 DIAGNOSIS — M549 Dorsalgia, unspecified: Secondary | ICD-10-CM | POA: Diagnosis not present

## 2021-09-09 DIAGNOSIS — I700 Atherosclerosis of aorta: Secondary | ICD-10-CM | POA: Diagnosis not present

## 2021-09-09 DIAGNOSIS — J439 Emphysema, unspecified: Secondary | ICD-10-CM | POA: Diagnosis not present

## 2021-09-13 ENCOUNTER — Telehealth: Payer: Self-pay

## 2021-09-13 ENCOUNTER — Telehealth: Payer: Self-pay | Admitting: Orthopaedic Surgery

## 2021-09-13 NOTE — Telephone Encounter (Signed)
Pt called stating that he got an injection with Dr. Ernestina Patches sometime in 2021 or 22. Pt states can he get a referral for Dr. Ernestina Patches to get a back injection. Pt states he fell and is in severe pain. Don't see were pt seen Dr. Ernestina Patches. Please call pt about this matter at [phone].

## 2021-09-13 NOTE — Telephone Encounter (Signed)
Pt called and would like to set up an apt to get a shot in his back. He stated that he recently fell and the shots helped last time. Please advise

## 2021-09-15 ENCOUNTER — Inpatient Hospital Stay: Payer: Medicare Other

## 2021-09-15 ENCOUNTER — Other Ambulatory Visit: Payer: Self-pay

## 2021-09-15 ENCOUNTER — Inpatient Hospital Stay: Payer: Medicare Other | Attending: Hematology & Oncology

## 2021-09-15 VITALS — BP 133/55 | HR 67 | Temp 96.6°F | Resp 19 | Ht 67.5 in | Wt 154.0 lb

## 2021-09-15 DIAGNOSIS — Z79899 Other long term (current) drug therapy: Secondary | ICD-10-CM | POA: Diagnosis not present

## 2021-09-15 DIAGNOSIS — Z5112 Encounter for antineoplastic immunotherapy: Secondary | ICD-10-CM | POA: Insufficient documentation

## 2021-09-15 DIAGNOSIS — C342 Malignant neoplasm of middle lobe, bronchus or lung: Secondary | ICD-10-CM | POA: Diagnosis not present

## 2021-09-15 DIAGNOSIS — C3411 Malignant neoplasm of upper lobe, right bronchus or lung: Secondary | ICD-10-CM

## 2021-09-15 LAB — CBC WITH DIFFERENTIAL (CANCER CENTER ONLY)
Abs Immature Granulocytes: 0.02 10*3/uL (ref 0.00–0.07)
Basophils Absolute: 0 10*3/uL (ref 0.0–0.1)
Basophils Relative: 0 %
Eosinophils Absolute: 0.1 10*3/uL (ref 0.0–0.5)
Eosinophils Relative: 3 %
HCT: 33.8 % — ABNORMAL LOW (ref 39.0–52.0)
Hemoglobin: 11.6 g/dL — ABNORMAL LOW (ref 13.0–17.0)
Immature Granulocytes: 0 %
Lymphocytes Relative: 16 %
Lymphs Abs: 0.8 10*3/uL (ref 0.7–4.0)
MCH: 33.1 pg (ref 26.0–34.0)
MCHC: 34.3 g/dL (ref 30.0–36.0)
MCV: 96.6 fL (ref 80.0–100.0)
Monocytes Absolute: 0.5 10*3/uL (ref 0.1–1.0)
Monocytes Relative: 11 %
Neutro Abs: 3.2 10*3/uL (ref 1.7–7.7)
Neutrophils Relative %: 70 %
Platelet Count: 107 10*3/uL — ABNORMAL LOW (ref 150–400)
RBC: 3.5 MIL/uL — ABNORMAL LOW (ref 4.22–5.81)
RDW: 14.3 % (ref 11.5–15.5)
WBC Count: 4.6 10*3/uL (ref 4.0–10.5)
nRBC: 0 % (ref 0.0–0.2)

## 2021-09-15 LAB — CMP (CANCER CENTER ONLY)
ALT: 7 U/L (ref 0–44)
AST: 11 U/L — ABNORMAL LOW (ref 15–41)
Albumin: 3.7 g/dL (ref 3.5–5.0)
Alkaline Phosphatase: 151 U/L — ABNORMAL HIGH (ref 38–126)
Anion gap: 6 (ref 5–15)
BUN: 12 mg/dL (ref 8–23)
CO2: 28 mmol/L (ref 22–32)
Calcium: 9.3 mg/dL (ref 8.9–10.3)
Chloride: 106 mmol/L (ref 98–111)
Creatinine: 0.72 mg/dL (ref 0.61–1.24)
GFR, Estimated: >60 mL/min (ref >60)
Glucose, Bld: 98 mg/dL (ref 70–99)
Potassium: 3.7 mmol/L (ref 3.5–5.1)
Sodium: 140 mmol/L (ref 135–145)
Total Bilirubin: 0.9 mg/dL (ref 0.3–1.2)
Total Protein: 6.1 g/dL — ABNORMAL LOW (ref 6.5–8.1)

## 2021-09-15 LAB — TSH: TSH: 1.87 u[IU]/mL (ref 0.320–4.118)

## 2021-09-15 MED ORDER — SODIUM CHLORIDE 0.9% FLUSH
10.0000 mL | INTRAVENOUS | Status: DC | PRN
  Administered 2021-09-15: 10 mL

## 2021-09-15 MED ORDER — SODIUM CHLORIDE 0.9% FLUSH
10.0000 mL | Freq: Once | INTRAVENOUS | Status: AC
  Administered 2021-09-15: 10 mL via INTRAVENOUS

## 2021-09-15 MED ORDER — DIPHENHYDRAMINE HCL 50 MG/ML IJ SOLN
25.0000 mg | Freq: Once | INTRAMUSCULAR | Status: AC
  Administered 2021-09-15: 25 mg via INTRAVENOUS
  Filled 2021-09-15: qty 1

## 2021-09-15 MED ORDER — SODIUM CHLORIDE 0.9 % IV SOLN: Freq: Once | INTRAVENOUS | Status: AC

## 2021-09-15 MED ORDER — HEPARIN SOD (PORK) LOCK FLUSH 100 UNIT/ML IV SOLN
500.0000 [IU] | Freq: Once | INTRAVENOUS | Status: AC | PRN
  Administered 2021-09-15: 500 [IU]

## 2021-09-15 MED ORDER — SODIUM CHLORIDE 0.9 % IV SOLN
3.0900 mg/kg | Freq: Once | INTRAVENOUS | Status: AC
  Administered 2021-09-15: 220 mg via INTRAVENOUS
  Filled 2021-09-15: qty 12

## 2021-09-15 MED ORDER — FAMOTIDINE IN NACL 20-0.9 MG/50ML-% IV SOLN
20.0000 mg | Freq: Once | INTRAVENOUS | Status: AC
  Administered 2021-09-15: 20 mg via INTRAVENOUS
  Filled 2021-09-15: qty 50

## 2021-09-15 MED ORDER — SODIUM CHLORIDE 0.9 % IV SOLN
1.0000 mg/kg | Freq: Once | INTRAVENOUS | Status: AC
  Administered 2021-09-15: 70 mg via INTRAVENOUS
  Filled 2021-09-15: qty 14

## 2021-09-15 NOTE — Progress Notes (Signed)
Ipilimumab (YERVOY) Patient Monitoring Assessment   Is the patient experiencing any of the following general symptoms?:  [] Difficulty performing normal activities [] Feeling sluggish or cold all the time [] Unusual weight gain [] Constant or unusual headaches [] Feeling dizzy or faint [] Changes in eyesight (blurry vision, double vision, or other vision problems) [] Changes in mood or behavior (ex: decreased sex drive, irritability, or forgetfulness) [] Starting new medications (ex: steroids, other medications that lower immune response) [x] Patient is not experiencing any of the general symptoms above.    Gastrointestinal  Patient is having 1 bowel movements each day.  Is this different from baseline? [] Yes [x] No Are your stools watery or do they have a foul smell? [] Yes [x] No Have you seen blood in your stools? [] Yes [x] No Are your stools dark, tarry, or sticky? [] Yes [x] No Are you having pain or tenderness in your belly? [] Yes [] No  Skin Does your skin itch? [] Yes [x] No Do you have a rash? [] Yes [x] No Has your skin blistered and/or peeled? [] Yes [x] No Do you have sores in your mouth? [] Yes [x] No  Hepatic Has your urine been dark or tea colored? [] Yes [x] No Have you noticed that your skin or the whites of your eyes are turning yellow? [] Yes [x] No Are you bleeding or bruising more easily than normal? [] Yes [x] No Are you nauseous and/or vomiting? [] Yes [x] No Do you have pain on the right side of your stomach? [] Yes [x] No  Neurologic  Are you having unusual weakness of legs, arms, or face? [] Yes [x] No Are you having numbness or tingling in your hands or feet? [] Yes [x] No  Sheria Lang [facility]

## 2021-09-15 NOTE — Patient Instructions (Signed)
[facility]  Discharge Instructions: Thank you for choosing [facility] to provide your oncology and hematology care.   If you have a lab appointment with the [facility], please go directly to the [facility] and check in at the registration area.  Wear comfortable clothing and clothing appropriate for easy access to any Portacath or PICC line.   We strive to give you quality time with your provider. You may need to reschedule your appointment if you arrive late (15 or more minutes).  Arriving late affects you and other patients whose appointments are after yours.  Also, if you miss three or more appointments without notifying the office, you may be dismissed from the clinic at the providers discretion.      For prescription refill requests, have your pharmacy contact our office and allow 72 hours for refills to be completed.    Today you received the following chemotherapy and/or immunotherapy agents Opdivo, Yervoy.      To help prevent nausea and vomiting after your treatment, we encourage you to take your nausea medication as directed.  BELOW ARE SYMPTOMS THAT SHOULD BE REPORTED IMMEDIATELY: *FEVER GREATER THAN 100.4 F (38 C) OR HIGHER *CHILLS OR SWEATING *NAUSEA AND VOMITING THAT IS NOT CONTROLLED WITH YOUR NAUSEA MEDICATION *UNUSUAL SHORTNESS OF BREATH *UNUSUAL BRUISING OR BLEEDING *URINARY PROBLEMS (pain or burning when urinating, or frequent urination) *BOWEL PROBLEMS (unusual diarrhea, constipation, pain near the anus) TENDERNESS IN MOUTH AND THROAT WITH OR WITHOUT PRESENCE OF ULCERS (sore throat, sores in mouth, or a toothache) UNUSUAL RASH, SWELLING OR PAIN  UNUSUAL VAGINAL DISCHARGE OR ITCHING   Items with * indicate a potential emergency and should be followed up as soon as possible or go to the Emergency Department if any problems should occur.  Please show the CHEMOTHERAPY ALERT CARD or IMMUNOTHERAPY ALERT CARD at check-in to  the Emergency Department and triage nurse. Should you have questions after your visit or need to cancel or reschedule your appointment, please contact [facility]  [phone] and follow the prompts.  Office hours are 8:00 a.m. to 4:30 p.m. Monday - Friday. Please note that voicemails left after 4:00 p.m. may not be returned until the following business day.  We are closed weekends and major holidays. You have access to a nurse at all times for urgent questions. Please call the main number to the clinic [phone] and follow the prompts.  For any non-urgent questions, you may also contact your provider using MyChart. We now offer e-Visits for anyone 38 and older to request care online for non-urgent symptoms. For details visit [email or web address].   Also download the MyChart app! Go to the app store, search "MyChart", open the app, select [facility], and log in with your MyChart username and password.  Due to Covid, a mask is required upon entering the hospital/clinic. If you do not have a mask, one will be given to you upon arrival. For doctor visits, patients may have 1 support person aged 61 or older with them. For treatment visits, patients cannot have anyone with them due to current Covid guidelines and our immunocompromised population.

## 2021-09-16 LAB — T4: T4, Total: 7.7 ug/dL (ref 4.5–12.0)

## 2021-09-21 ENCOUNTER — Other Ambulatory Visit: Payer: Self-pay

## 2021-09-21 ENCOUNTER — Telehealth: Payer: Self-pay | Admitting: *Deleted

## 2021-09-21 ENCOUNTER — Encounter: Payer: Self-pay | Admitting: Physician Assistant

## 2021-09-21 ENCOUNTER — Ambulatory Visit (INDEPENDENT_AMBULATORY_CARE_PROVIDER_SITE_OTHER): Payer: Medicare Other

## 2021-09-21 ENCOUNTER — Ambulatory Visit (INDEPENDENT_AMBULATORY_CARE_PROVIDER_SITE_OTHER): Payer: Medicare Other | Admitting: Physician Assistant

## 2021-09-21 DIAGNOSIS — M545 Low back pain, unspecified: Secondary | ICD-10-CM

## 2021-09-21 MED ORDER — METHYLPREDNISOLONE 4 MG PO TABS: ORAL_TABLET | ORAL | 0 refills | Status: DC

## 2021-09-21 NOTE — Progress Notes (Signed)
Office Visit Note   Patient: Jerome Kelley           Date of Birth: 1940-12-14           MRN: [id number] Visit Date: 09/21/2021              Requested by: Kelton Pillar, MD [place] [facility] [employer] [place],  [place] [id number] PCP: Kelton Pillar, MD   Assessment & Plan: Visit Diagnoses:  1. Acute bilateral low back pain without sciatica     Plan:  Place him on Medrol Dosepak.  Given the fact that he is being treated for his lung cancer would like for him to check with his oncologist prior to going on the [place].  I discussed this with he and his wife both and they will call Dr. Marin Olp prior to starting [facility].  If in fact he is unable to take the Medrol Dosepak then we would prescribe pain medication that he can take primarily at night as this is when he has most of his pain.  Would like to see him back in just 2 weeks for reexamination if he still having severe back pain would like to obtain a lateral view of his lumbar spine to evaluate for an occult fracture involving the L1 vertebrae.  Questions were encouraged and answered at length.  Follow-Up Instructions: Return in about 2 weeks (around 10/05/2021).   Orders:  Orders Placed This Encounter  Procedures   XR Lumbar Spine 2-3 Views   Meds ordered this encounter  Medications   methylPREDNISolone (MEDROL) 4 MG tablet    Sig: Take as directed    Dispense:  21 tablet    Refill:  0      Procedures: No procedures performed   Clinical Data: No additional findings.   Subjective: Chief Complaint  Patient presents with   Lower Back - Pain    HPI Jerome Kelley 81 year old male well-known to Dr. Delilah Shan service comes in today with low back pain status post fall a week and a half ago.  He has had low back pain in the past had to have a epidural steroid injections.  Most of the pain is across the low back without radicular symptoms.  He has tried ice heat this helps some.  He is also tried Tylenol and Advil  with no significant relief.  He denies any waking pain bowel or bladder dysfunction or saddle anesthesia like symptoms.  Rates pain to be 6-7 out of 10 pain at worst.  Pain is worse when lying down and turning over in the bed.  No pain with walking.  He also has some discomfort when he first gets up.  He is currently being treated for malignant neoplasm in his upper lobe of his right lung.  He does receive chemotherapy for his lung mass.  He was seen elsewhere and was given Zanaflex for the back pain but this really is not helped.  Review of Systems Negative for fevers or chills.  Please see HPI otherwise negative  Objective: Vital Signs: There were no vitals taken for this visit.  Physical Exam Constitutional:      General: He is not in acute distress.    Appearance: He is not diaphoretic.     Comments: Thin   Pulmonary:     Effort: Pulmonary effort is normal.  Neurological:     Mental Status: He is alert and oriented to person, place, and time.  Psychiatric:  Mood and Affect: Mood normal.    Ortho Exam Lower extremities he has 5 out of 5 strength throughout lower extremities against resistance negative straight leg raise bilaterally.  Good range of motion of bilateral hips without pain.  Nontender over the upper lumbar vertebral column.  No tenderness to the paraspinous region lower lumbar.  Lower lumbar vertebral column tenderness with palpation at L4-5. Specialty Comments:  No specialty comments available.  Imaging: XR Lumbar Spine 2-3 Views  Result Date: 09/21/2021 Lumbar spine 2 views: Slight loss of lordotic curvature.  Endplate spurring at multiple levels.  Disc base overall well-maintained.  Compression fracture of L1 age undetermined but new since films in 2020.     PMFS History: Patient Active Problem List   Diagnosis Date Noted   COPD, mild ([facility]) 01/24/2021   Port-A-Cath in place 10/29/2020   Malignant neoplasm of upper lobe of right lung ([facility]) 04/30/2020    Goals of care, counseling/discussion 03/15/2020   Hip fracture ([facility]) 10/18/2018   Essential hypertension 10/18/2018   Closed fracture of neck of right femur ([facility])    Colon cancer ([facility]) 09/18/2011   Past Medical History:  Diagnosis Date   Colon cancer ([facility]) 09/18/2011   Goals of care, counseling/discussion 03/15/2020   History of chemotherapy    History of radiation therapy 11/04/2020-12/15/2020    mediastinal and right hilar region    Dr Gery Pray   Hypertension    Lung cancer, upper lobe ([facility]) 09/18/2011   Malignant neoplasm of upper lobe of right lung ([facility]) 04/30/2020    History reviewed. No pertinent family history.  Past Surgical History:  Procedure Laterality Date   BRONCHIAL WASHINGS  03/19/2020   Procedure: BRONCHIAL WASHINGS;  Surgeon: Juanito Doom, MD;  Location: [facility];  Service: Cardiopulmonary;;   COLON SURGERY  2010   ENDOBRONCHIAL ULTRASOUND N/A 03/19/2020   Procedure: ENDOBRONCHIAL ULTRASOUND;  Surgeon: Juanito Doom, MD;  Location: [facility];  Service: Cardiopulmonary;  Laterality: N/A;   FINE NEEDLE ASPIRATION  03/19/2020   Procedure: FINE NEEDLE ASPIRATION;  Surgeon: Juanito Doom, MD;  Location: [facility];  Service: Cardiopulmonary;;   FINE NEEDLE ASPIRATION BIOPSY  04/02/2020   Procedure: FINE NEEDLE ASPIRATION BIOPSY;  Surgeon: Laurin Coder, MD;  Location: [facility];  Service: Pulmonary;;   IR IMAGING GUIDED PORT INSERTION  05/07/2020   LUNG SURGERY     right side    port a cath insertion     port a cath removed     TOTAL HIP ARTHROPLASTY Right 10/18/2018   Procedure: TOTAL HIP ARTHROPLASTY ANTERIOR APPROACH;  Surgeon: Mcarthur Rossetti, MD;  Location: [facility];  Service: Orthopedics;  Laterality: Right;   VIDEO BRONCHOSCOPY N/A 03/19/2020   Procedure: VIDEO BRONCHOSCOPY WITHOUT FLUORO;  Surgeon: Juanito Doom, MD;  Location: [facility];  Service: Cardiopulmonary;  Laterality: N/A;   VIDEO BRONCHOSCOPY N/A 04/02/2020   Procedure:  VIDEO BRONCHOSCOPY WITH EBUS;  Surgeon: Laurin Coder, MD;  Location: [facility];  Service: Pulmonary;  Laterality: N/A;   Social History   Occupational History   Not on file  Tobacco Use   Smoking status: Every Day    Packs/day: 1.00    Years: 58.00    Pack years: 58.00    Types: Cigarettes    Start date: 09/03/1956   Smokeless tobacco: Never   Tobacco comments:    does not want to quit  Vaping Use   Vaping Use: Never used  Substance and Sexual Activity  Alcohol use: Yes    Alcohol/week: 3.0 standard drinks    Types: 3 Cans of beer per week    Comment: Fridays only   Drug use: Never   Sexual activity: Not on file

## 2021-09-21 NOTE — Telephone Encounter (Signed)
Received a call from patients wife Enid Derry.  Asked if it was ok for patient to take Prednisone that was prescribed by patients primary care office for back pain.  Ok per dr Marin Olp.  [place] notified.

## 2021-10-05 ENCOUNTER — Ambulatory Visit: Payer: Medicare Other | Admitting: Physician Assistant

## 2021-10-06 ENCOUNTER — Inpatient Hospital Stay: Payer: Medicare Other

## 2021-10-06 ENCOUNTER — Other Ambulatory Visit: Payer: Self-pay

## 2021-10-06 ENCOUNTER — Inpatient Hospital Stay (HOSPITAL_BASED_OUTPATIENT_CLINIC_OR_DEPARTMENT_OTHER): Payer: Medicare Other | Admitting: Hematology & Oncology

## 2021-10-06 ENCOUNTER — Encounter: Payer: Self-pay | Admitting: Hematology & Oncology

## 2021-10-06 VITALS — BP 120/51 | HR 71 | Temp 97.3°F | Resp 19 | Ht 67.72 in | Wt 153.0 lb

## 2021-10-06 DIAGNOSIS — E039 Hypothyroidism, unspecified: Secondary | ICD-10-CM

## 2021-10-06 DIAGNOSIS — C3411 Malignant neoplasm of upper lobe, right bronchus or lung: Secondary | ICD-10-CM | POA: Diagnosis not present

## 2021-10-06 DIAGNOSIS — Z5112 Encounter for antineoplastic immunotherapy: Secondary | ICD-10-CM | POA: Diagnosis not present

## 2021-10-06 DIAGNOSIS — Z79899 Other long term (current) drug therapy: Secondary | ICD-10-CM | POA: Diagnosis not present

## 2021-10-06 DIAGNOSIS — C342 Malignant neoplasm of middle lobe, bronchus or lung: Secondary | ICD-10-CM | POA: Diagnosis not present

## 2021-10-06 LAB — CMP (CANCER CENTER ONLY)
ALT: 7 U/L (ref 0–44)
AST: 10 U/L — ABNORMAL LOW (ref 15–41)
Albumin: 3 g/dL — ABNORMAL LOW (ref 3.5–5.0)
Alkaline Phosphatase: 123 U/L (ref 38–126)
Anion gap: 5 (ref 5–15)
BUN: 13 mg/dL (ref 8–23)
CO2: 27 mmol/L (ref 22–32)
Calcium: 8.2 mg/dL — ABNORMAL LOW (ref 8.9–10.3)
Chloride: 107 mmol/L (ref 98–111)
Creatinine: 0.75 mg/dL (ref 0.61–1.24)
GFR, Estimated: >60 mL/min (ref >60)
Glucose, Bld: 99 mg/dL (ref 70–99)
Potassium: 3.6 mmol/L (ref 3.5–5.1)
Sodium: 139 mmol/L (ref 135–145)
Total Bilirubin: 0.8 mg/dL (ref 0.3–1.2)
Total Protein: 5.8 g/dL — ABNORMAL LOW (ref 6.5–8.1)

## 2021-10-06 LAB — CBC WITH DIFFERENTIAL (CANCER CENTER ONLY)
Abs Immature Granulocytes: 0.02 10*3/uL (ref 0.00–0.07)
Basophils Absolute: 0 10*3/uL (ref 0.0–0.1)
Basophils Relative: 0 %
Eosinophils Absolute: 0.2 10*3/uL (ref 0.0–0.5)
Eosinophils Relative: 4 %
HCT: 32.4 % — ABNORMAL LOW (ref 39.0–52.0)
Hemoglobin: 11.1 g/dL — ABNORMAL LOW (ref 13.0–17.0)
Immature Granulocytes: 0 %
Lymphocytes Relative: 18 %
Lymphs Abs: 0.8 10*3/uL (ref 0.7–4.0)
MCH: 33.1 pg (ref 26.0–34.0)
MCHC: 34.3 g/dL (ref 30.0–36.0)
MCV: 96.7 fL (ref 80.0–100.0)
Monocytes Absolute: 0.5 10*3/uL (ref 0.1–1.0)
Monocytes Relative: 10 %
Neutro Abs: 3.2 10*3/uL (ref 1.7–7.7)
Neutrophils Relative %: 68 %
Platelet Count: 72 10*3/uL — ABNORMAL LOW (ref 150–400)
RBC: 3.35 MIL/uL — ABNORMAL LOW (ref 4.22–5.81)
RDW: 14.9 % (ref 11.5–15.5)
WBC Count: 4.7 10*3/uL (ref 4.0–10.5)
nRBC: 0 % (ref 0.0–0.2)

## 2021-10-06 LAB — TSH: TSH: 1.459 u[IU]/mL (ref 0.320–4.118)

## 2021-10-06 LAB — LACTATE DEHYDROGENASE: LDH: 170 U/L (ref 98–192)

## 2021-10-06 MED ORDER — SODIUM CHLORIDE 0.9 % IV SOLN
2.8100 mg/kg | Freq: Once | INTRAVENOUS | Status: AC
  Administered 2021-10-06: 200 mg via INTRAVENOUS
  Filled 2021-10-06: qty 20

## 2021-10-06 MED ORDER — SODIUM CHLORIDE 0.9% FLUSH
10.0000 mL | INTRAVENOUS | Status: DC | PRN
  Administered 2021-10-06: 10 mL

## 2021-10-06 MED ORDER — SODIUM CHLORIDE 0.9 % IV SOLN: Freq: Once | INTRAVENOUS | Status: AC

## 2021-10-06 MED ORDER — HEPARIN SOD (PORK) LOCK FLUSH 100 UNIT/ML IV SOLN
500.0000 [IU] | Freq: Once | INTRAVENOUS | Status: AC | PRN
  Administered 2021-10-06: 500 [IU]

## 2021-10-06 NOTE — Progress Notes (Signed)
Ok to treat with platelets of 72 per Dr Marin Olp. dph

## 2021-10-06 NOTE — Progress Notes (Signed)
Hematology and Oncology Follow Up Visit  Jerome Kelley [id number] March 03, 1941 81 y.o. 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  He tolerated his first cycle of immunotherapy quite nicely.  He really had no side effects.  There is no diarrhea.  His main problem has been the tree pollen.  With the warm weather that we have been having, the pollen is a lot higher now than the typically is.  Both he and his wife are having a lot of issues with congestion.  He has had no problems with modest this.  He had a little bit of a cough.  His appetite is doing okay.  There is no nausea or vomiting.  He has had no urinary issues.  He has had no leg swelling.  There is been no rashes.  He has had no pain.  Overall, his performance status is ECOG 1.    Medications:  Allergies as of 10/06/2021   No Known Allergies      Medication List        Accurate as of October 06, 2021 10:05 AM. If you have any questions, ask your nurse or doctor.          STOP taking these medications    methylPREDNISolone 4 MG tablet Commonly known as: Medrol Stopped by: [name], MD       TAKE these medications    aspirin 81 MG chewable tablet Chew 1 tablet (81 mg total) by mouth 2 (two) times daily. What changed: when to take this   benazepril 20 MG tablet Commonly known as: LOTENSIN Take  20 mg by mouth daily.   vitamin B-12 1000 MCG tablet Commonly known as: CYANOCOBALAMIN Take 1 tablet (1,000 mcg total) by mouth daily.        Allergies: No Known Allergies  Past Medical History, Surgical history, Social history, and Family History were reviewed and updated.  Review of Systems: Review of Systems  Constitutional: Negative.   HENT: Negative.    Eyes: Negative.   Respiratory: Negative.    Cardiovascular: Negative.   Gastrointestinal: Negative.   Genitourinary: Negative.   Musculoskeletal: Negative.   Skin: Negative.   Neurological:  Positive for dizziness.  Endo/Heme/Allergies: Negative.   Psychiatric/Behavioral: Negative.      Physical Exam:  height is 5' 7.72" (1.72 m) and weight is 153 lb 0.6 oz (69.4 kg). His oral temperature is 97.3 F (36.3 C) (abnormal). His blood pressure is 120/51 (abnormal) and his pulse is 71. His respiration is 19 and oxygen saturation is 100%.   Wt Readings from Last 3 Encounters:  10/06/21 153 lb 0.6 oz (69.4 kg)  09/15/21 154 lb (69.9 kg)  09/07/21 157 lb (71.2 kg)    Physical Exam Vitals reviewed.  HENT:     Head: Normocephalic and atraumatic.  Eyes:     Pupils: Pupils are equal, round, and reactive to light.  Cardiovascular:  Rate and Rhythm: Normal rate and regular rhythm.     Heart sounds: Normal heart sounds.  Pulmonary:     Effort: Pulmonary effort is normal.     Breath sounds: Normal breath sounds.  Abdominal:     General: Bowel sounds are normal.     Palpations: Abdomen is soft.  Musculoskeletal:        General: No tenderness or deformity. Normal range of motion.     Cervical back: Normal range of motion.  Lymphadenopathy:     Cervical: No cervical adenopathy.  Skin:    General: Skin is warm and dry.     Findings: No erythema or rash.  Neurological:     Mental Status: He is alert and oriented to person, place, and time.  Psychiatric:        Behavior: Behavior normal.        Thought Content:  Thought content normal.        Judgment: Judgment normal.    Lab Results  Component Value Date   WBC 4.7 10/06/2021   HGB 11.1 (L) 10/06/2021   HCT 32.4 (L) 10/06/2021   MCV 96.7 10/06/2021   PLT 72 (L) 10/06/2021   Lab Results  Component Value Date   FERRITIN 213 09/07/2021   IRON 41 (L) 09/07/2021   TIBC 256 09/07/2021   UIBC 215 09/07/2021   IRONPCTSAT 16 (L) 09/07/2021   Lab Results  Component Value Date   RETICCTPCT 3.6 (H) 09/07/2021   RBC 3.35 (L) 10/06/2021   No results found for: KPAFRELGTCHN, LAMBDASER, KAPLAMBRATIO No results found for: IGGSERUM, IGA, IGMSERUM No results found for: Odetta Pink, SPEI   Chemistry      Component Value Date/Time   NA 139 10/06/2021 0916   NA 140 06/28/2011 0950   K 3.6 10/06/2021 0916   K 4.7 06/28/2011 0950   CL 107 10/06/2021 0916   CL 100 06/28/2011 0950   CO2 27 10/06/2021 0916   CO2 29 06/28/2011 0950   BUN 13 10/06/2021 0916   BUN 10 06/28/2011 0950   CREATININE 0.75 10/06/2021 0916   CREATININE 0.7 06/28/2011 0950      Component Value Date/Time   CALCIUM 8.2 (L) 10/06/2021 0916   CALCIUM 9.0 06/28/2011 0950   ALKPHOS 123 10/06/2021 0916   ALKPHOS 110 (H) 12/22/2010 0850   AST 10 (L) 10/06/2021 0916   ALT 7 10/06/2021 0916   ALT 18 12/22/2010 0850   BILITOT 0.8 10/06/2021 0916       Impression and Plan: Jerome Kelley is a very pleasant 81 yo caucasian gentleman with previous history of right lung cancer as well as colon cancer 10 years ago. He now has stage IIIb adenocarcinoma of the lung, right middle lobe.   We initially treated him with systemic chemotherapy.  He was not a candidate for surgery.  He was not candidate for curative chemoradiation therapy together.  After systemic chemotherapy, we then relied on radiation and low-dose carboplatinum/Taxol.   Unfortunately, his disease likes to come back.   Hopefully, the immunotherapy will be able to help  and get him in a little bit of a remission.  I forgot to mention that he is still smoking.  I think he probably smokes quite a bit.  I talked to him about stopping to smoke.  He just does not seem interested.  We will go along with a second cycle of treatment.  Hopefully, he will do as well as he  did with the first cycle.  I will plan to get him back to see me in I just hate that it is back.  I know that were not going to cure this.  I talked to he and his wife about this.  Our goal is his quality of life.  I think that with immunotherapy, we can certainly achieve quality of life for him.  I went over the side effects of immunotherapy.  I told him about diarrhea.  I told he and his wife that they must call us as soon as he has diarrhea.  We have also talked to them about the fact that his thyroid could go out on him.  We will check his thyroid whenever we see him.  Also explained the possibility of hepatic inflammation.  I explained the possibility of a skin rash.  He understands all this.  He agrees to go ahead with treatment.  I just wish he would stop smoking.  I talked to him about smoking cessation.  Again is not interested in stopping to smoke.  He understands that this will certainly make treatment a lot more difficult to work.  We will try to get treatment started in a week.  I would do 4 weeks of combined immunotherapy and then repeat his PET scan.  I will plan to see him back when he has his second cycle of immunotherapy.   [name], MD 2/23/202310:05 AM

## 2021-10-06 NOTE — Patient Instructions (Signed)

## 2021-10-06 NOTE — Patient Instructions (Signed)
Jerome Kelley  Discharge Instructions: Thank you for choosing [facility] to provide your oncology and hematology care.   If you have a lab appointment with the [facility], please go directly to the [facility] and check in at the registration area.  Wear comfortable clothing and clothing appropriate for easy access to any Portacath or PICC line.   We strive to give you quality time with your provider. You may need to reschedule your appointment if you arrive late (15 or more minutes).  Arriving late affects you and other patients whose appointments are after yours.  Also, if you miss three or more appointments without notifying the office, you may be dismissed from the clinic at the providers discretion.      For prescription refill requests, have your pharmacy contact our office and allow 72 hours for refills to be completed.    Today you received the following chemotherapy and/or immunotherapy agents Opdivo      To help prevent nausea and vomiting after your treatment, we encourage you to take your nausea medication as directed.  BELOW ARE SYMPTOMS THAT SHOULD BE REPORTED IMMEDIATELY: *FEVER GREATER THAN 100.4 F (38 C) OR HIGHER *CHILLS OR SWEATING *NAUSEA AND VOMITING THAT IS NOT CONTROLLED WITH YOUR NAUSEA MEDICATION *UNUSUAL SHORTNESS OF BREATH *UNUSUAL BRUISING OR BLEEDING *URINARY PROBLEMS (pain or burning when urinating, or frequent urination) *BOWEL PROBLEMS (unusual diarrhea, constipation, pain near the anus) TENDERNESS IN MOUTH AND THROAT WITH OR WITHOUT PRESENCE OF ULCERS (sore throat, sores in mouth, or a toothache) UNUSUAL RASH, SWELLING OR PAIN  UNUSUAL VAGINAL DISCHARGE OR ITCHING   Items with * indicate a potential emergency and should be followed up as soon as possible or go to the Emergency Department if any problems should occur.  Please show the CHEMOTHERAPY ALERT CARD or IMMUNOTHERAPY ALERT CARD at check-in to the  Emergency Department and triage nurse. Should you have questions after your visit or need to cancel or reschedule your appointment, please contact [facility]  [phone] and follow the prompts.  Office hours are 8:00 a.m. to 4:30 p.m. Monday - Friday. Please note that voicemails left after 4:00 p.m. may not be returned until the following business day.  We are closed weekends and major holidays. You have access to a nurse at all times for urgent questions. Please call the main number to the clinic [phone] and follow the prompts.  For any non-urgent questions, you may also contact your provider using MyChart. We now offer e-Visits for anyone 68 and older to request care online for non-urgent symptoms. For details visit [email or web address].   Also download the MyChart app! Go to the app store, search "MyChart", open the app, select [facility], and log in with your MyChart username and password.  Due to Covid, a mask is required upon entering the hospital/clinic. If you do not have a mask, one will be given to you upon arrival. For doctor visits, patients may have 1 support person aged 35 or older with them. For treatment visits, patients cannot have anyone with them due to current Covid guidelines and our immunocompromised population.

## 2021-10-07 LAB — T4: T4, Total: 7.5 ug/dL (ref 4.5–12.0)

## 2021-10-10 ENCOUNTER — Ambulatory Visit (INDEPENDENT_AMBULATORY_CARE_PROVIDER_SITE_OTHER): Payer: Medicare Other | Admitting: Physician Assistant

## 2021-10-10 ENCOUNTER — Encounter: Payer: Self-pay | Admitting: Physician Assistant

## 2021-10-10 ENCOUNTER — Ambulatory Visit (INDEPENDENT_AMBULATORY_CARE_PROVIDER_SITE_OTHER): Payer: Medicare Other

## 2021-10-10 DIAGNOSIS — M545 Low back pain, unspecified: Secondary | ICD-10-CM

## 2021-10-10 NOTE — Progress Notes (Signed)
HPI: [name] returns today for follow-up of his low back pain.  States that the prednisone Dosepak really did not help take with much pain.  He states pain similar to what he had over a year ago when he underwent an epidural steroid injection by Dr. Ernestina Patches as well as a right transforaminal injection at L5-S1.  States this took away his pain.  Again this most recent onset of back pain was after a fall.  He states that at this point time his pain is worse in the morning when I first gets up to walk.  No real radicular symptoms down either leg.  Pains mostly right lower back.   Review of systems see HPI otherwise negative  Physical exam: General well-developed well-nourished male no acute distress. Lower extremities: Negative straight leg raise bilaterally.  Tenderness right lower lumbar paraspinous region.  Radiographs: Lateral view lumbar spine unchanged from films 2 weeks ago.  There is no sclerotic activity at the L1 vertebral body which she sustained a compression fracture of undetermined age.   Plan: We will send him for right transforaminal injection at L5-S1.  He will follow-up with [place] as needed.  Questions encouraged and answered at length

## 2021-10-10 NOTE — Addendum Note (Signed)
Addended by: Robyne Peers on: 10/10/2021 03:58 PM   Modules accepted: Orders

## 2021-10-26 ENCOUNTER — Other Ambulatory Visit: Payer: Self-pay

## 2021-10-26 ENCOUNTER — Encounter: Payer: Self-pay | Admitting: Physical Medicine and Rehabilitation

## 2021-10-26 ENCOUNTER — Ambulatory Visit (INDEPENDENT_AMBULATORY_CARE_PROVIDER_SITE_OTHER): Payer: Medicare Other | Admitting: Physical Medicine and Rehabilitation

## 2021-10-26 ENCOUNTER — Ambulatory Visit: Payer: Self-pay

## 2021-10-26 VITALS — BP 101/53 | HR 78

## 2021-10-26 DIAGNOSIS — M5416 Radiculopathy, lumbar region: Secondary | ICD-10-CM | POA: Diagnosis not present

## 2021-10-26 MED ORDER — METHYLPREDNISOLONE ACETATE 80 MG/ML IJ SUSP
80.0000 mg | Freq: Once | INTRAMUSCULAR | Status: AC
  Administered 2021-10-26: 80 mg

## 2021-10-26 NOTE — Patient Instructions (Signed)

## 2021-10-26 NOTE — Progress Notes (Signed)
Pt state lower back pain that travels down his right leg. Pt state walking, standing and bending makes the pain worse. ?Pt state he takes over the counter pain meds to help ease his pain. ? ?Numeric Pain Rating Scale and Functional Assessment ?Average Pain 5 ? ? ?In the last MONTH (on 0-10 scale) has pain interfered with the following? ? ?1. General activity like being  able to carry out your everyday physical activities such as walking, climbing stairs, carrying groceries, or moving a chair?  ?Rating(10) ? ? ?+Driver, -BT, -Dye Allergies. ? ?

## 2021-10-27 ENCOUNTER — Telehealth: Payer: Self-pay | Admitting: *Deleted

## 2021-10-27 ENCOUNTER — Inpatient Hospital Stay: Payer: Medicare Other

## 2021-10-27 ENCOUNTER — Inpatient Hospital Stay: Payer: Medicare Other | Attending: Hematology & Oncology

## 2021-10-27 ENCOUNTER — Encounter: Payer: Self-pay | Admitting: Hematology & Oncology

## 2021-10-27 ENCOUNTER — Inpatient Hospital Stay (HOSPITAL_BASED_OUTPATIENT_CLINIC_OR_DEPARTMENT_OTHER): Payer: Medicare Other | Admitting: Hematology & Oncology

## 2021-10-27 ENCOUNTER — Other Ambulatory Visit: Payer: Self-pay

## 2021-10-27 VITALS — BP 142/55 | HR 67 | Temp 97.8°F | Resp 17 | Ht 67.0 in | Wt 153.0 lb

## 2021-10-27 DIAGNOSIS — Z5112 Encounter for antineoplastic immunotherapy: Secondary | ICD-10-CM | POA: Insufficient documentation

## 2021-10-27 DIAGNOSIS — Z79899 Other long term (current) drug therapy: Secondary | ICD-10-CM | POA: Diagnosis not present

## 2021-10-27 DIAGNOSIS — C3411 Malignant neoplasm of upper lobe, right bronchus or lung: Secondary | ICD-10-CM | POA: Diagnosis not present

## 2021-10-27 DIAGNOSIS — K148 Other diseases of tongue: Secondary | ICD-10-CM | POA: Diagnosis not present

## 2021-10-27 DIAGNOSIS — C772 Secondary and unspecified malignant neoplasm of intra-abdominal lymph nodes: Secondary | ICD-10-CM | POA: Insufficient documentation

## 2021-10-27 DIAGNOSIS — C342 Malignant neoplasm of middle lobe, bronchus or lung: Secondary | ICD-10-CM | POA: Diagnosis not present

## 2021-10-27 LAB — CMP (CANCER CENTER ONLY)
ALT: 7 U/L (ref 0–44)
AST: 9 U/L — ABNORMAL LOW (ref 15–41)
Albumin: 3.6 g/dL (ref 3.5–5.0)
Alkaline Phosphatase: 120 U/L (ref 38–126)
Anion gap: 8 (ref 5–15)
BUN: 19 mg/dL (ref 8–23)
CO2: 25 mmol/L (ref 22–32)
Calcium: 9.2 mg/dL (ref 8.9–10.3)
Chloride: 106 mmol/L (ref 98–111)
Creatinine: 0.77 mg/dL (ref 0.61–1.24)
GFR, Estimated: >60 mL/min (ref >60)
Glucose, Bld: 127 mg/dL — ABNORMAL HIGH (ref 70–99)
Potassium: 3.7 mmol/L (ref 3.5–5.1)
Sodium: 139 mmol/L (ref 135–145)
Total Bilirubin: 0.7 mg/dL (ref 0.3–1.2)
Total Protein: 6.6 g/dL (ref 6.5–8.1)

## 2021-10-27 LAB — IRON AND IRON BINDING CAPACITY (CC-WL,HP ONLY)
Iron: 47 ug/dL (ref 45–182)
Saturation Ratios: 19 % (ref 17.9–39.5)
TIBC: 244 ug/dL — ABNORMAL LOW (ref 250–450)
UIBC: 197 ug/dL (ref 117–376)

## 2021-10-27 LAB — CBC WITH DIFFERENTIAL (CANCER CENTER ONLY)
Abs Immature Granulocytes: 0.04 10*3/uL (ref 0.00–0.07)
Basophils Absolute: 0 10*3/uL (ref 0.0–0.1)
Basophils Relative: 0 %
Eosinophils Absolute: 0.1 10*3/uL (ref 0.0–0.5)
Eosinophils Relative: 1 %
HCT: 34.3 % — ABNORMAL LOW (ref 39.0–52.0)
Hemoglobin: 11.5 g/dL — ABNORMAL LOW (ref 13.0–17.0)
Immature Granulocytes: 1 %
Lymphocytes Relative: 14 %
Lymphs Abs: 0.9 10*3/uL (ref 0.7–4.0)
MCH: 32.5 pg (ref 26.0–34.0)
MCHC: 33.5 g/dL (ref 30.0–36.0)
MCV: 96.9 fL (ref 80.0–100.0)
Monocytes Absolute: 0.6 10*3/uL (ref 0.1–1.0)
Monocytes Relative: 9 %
Neutro Abs: 4.7 10*3/uL (ref 1.7–7.7)
Neutrophils Relative %: 75 %
Platelet Count: 87 10*3/uL — ABNORMAL LOW (ref 150–400)
RBC: 3.54 MIL/uL — ABNORMAL LOW (ref 4.22–5.81)
RDW: 14.6 % (ref 11.5–15.5)
WBC Count: 6.3 10*3/uL (ref 4.0–10.5)
nRBC: 0 % (ref 0.0–0.2)

## 2021-10-27 LAB — LACTATE DEHYDROGENASE: LDH: 146 U/L (ref 98–192)

## 2021-10-27 LAB — TSH: TSH: 0.093 u[IU]/mL — ABNORMAL LOW (ref 0.320–4.118)

## 2021-10-27 MED ORDER — SODIUM CHLORIDE 0.9% FLUSH
10.0000 mL | INTRAVENOUS | Status: DC | PRN
  Administered 2021-10-27: 10 mL

## 2021-10-27 MED ORDER — SODIUM CHLORIDE 0.9 % IV SOLN: Freq: Once | INTRAVENOUS | Status: AC

## 2021-10-27 MED ORDER — SODIUM CHLORIDE 0.9 % IV SOLN
2.8100 mg/kg | Freq: Once | INTRAVENOUS | Status: AC
  Administered 2021-10-27: 200 mg via INTRAVENOUS
  Filled 2021-10-27: qty 20

## 2021-10-27 MED ORDER — HEPARIN SOD (PORK) LOCK FLUSH 100 UNIT/ML IV SOLN: 500.0000 [IU] | Freq: Once | INTRAVENOUS | Status: DC | PRN

## 2021-10-27 MED ORDER — SODIUM CHLORIDE 0.9 % IV SOLN
2.8100 mg/kg | Freq: Once | INTRAVENOUS | Status: DC
  Filled 2021-10-27: qty 20

## 2021-10-27 MED ORDER — DIPHENHYDRAMINE HCL 50 MG/ML IJ SOLN
25.0000 mg | Freq: Once | INTRAMUSCULAR | Status: AC
  Administered 2021-10-27: 25 mg via INTRAVENOUS
  Filled 2021-10-27: qty 1

## 2021-10-27 MED ORDER — FAMOTIDINE IN NACL 20-0.9 MG/50ML-% IV SOLN
20.0000 mg | Freq: Once | INTRAVENOUS | Status: AC
  Administered 2021-10-27: 20 mg via INTRAVENOUS
  Filled 2021-10-27: qty 50

## 2021-10-27 MED ORDER — SODIUM CHLORIDE 0.9% FLUSH: 10.0000 mL | INTRAVENOUS | Status: DC | PRN

## 2021-10-27 MED ORDER — HEPARIN SOD (PORK) LOCK FLUSH 100 UNIT/ML IV SOLN
500.0000 [IU] | Freq: Once | INTRAVENOUS | Status: AC | PRN
  Administered 2021-10-27: 500 [IU]

## 2021-10-27 MED ORDER — SODIUM CHLORIDE 0.9 % IV SOLN
1.0000 mg/kg | Freq: Once | INTRAVENOUS | Status: AC
  Administered 2021-10-27: 70 mg via INTRAVENOUS
  Filled 2021-10-27: qty 14

## 2021-10-27 NOTE — Progress Notes (Signed)
?Hematology and Oncology Follow Up Visit ? ?Jerome Kelley ?[id number] ?10-15-1940 81 y.o. ?10/27/2021 ? ? ?Principle Diagnosis:  ?Stage IIIB (B5MC8Y2) adenocarcinoma of the right middle lung ?History of stage Ia ([id number]) adenocarcinoma the right lung - resected in March 2011 ?History of stage II adenocarcinoma of the colon-high risk - April 2010 ?Metastatic adenocarcinoma of the lung --retroperitoneal nodal metastasis and new left lung nodule  --high PD-L1 ?  ?Current Therapy:        ?Carboplatin/Alimta/Pembrolizumab - started 05/10/2020- s/p cycle 6 ?Radiation with weekly carboplatin/Taxol-start on 11/01/2020 -- completed on 12/20/2020 ?Yervoy/Opdivo --s/p cycle #1  -- start on 09/15/2021 ? ?  ?Interim History:  Jerome Kelley is here today for follow-up.  So far, he is doing well with the immunotherapy.  He has had no problem with rashes.  Is been no diarrhea.  He has had no cough or shortness of breath.  He has had no issues with his thyroid.  He has had no headache. ? ?There is been no leg swelling.  He has had no bleeding.  He has had no cough or shortness of breath.  He has had no visual changes. ? ?Overall, his performance status is ECOG 1.   ? ?Medications:  ?Allergies as of 10/27/2021   ?No Known Allergies ?  ? ?  ?Medication List  ?  ? ?  ? Accurate as of October 27, 2021 10:56 AM. If you have any questions, ask your nurse or doctor.  ?  ?  ? ?  ? ?aspirin 81 MG chewable tablet ?Chew 1 tablet (81 mg total) by mouth 2 (two) times daily. ?What changed: when to take this ?  ?benazepril 20 MG tablet ?Commonly known as: LOTENSIN ?Take 20 mg by mouth daily. ?  ?vitamin B-12 1000 MCG tablet ?Commonly known as: CYANOCOBALAMIN ?Take 1 tablet (1,000 mcg total) by mouth daily. ?  ? ?  ? ? ?Allergies: No Known Allergies ? ?Past Medical History, Surgical history, Social history, and Family History were reviewed and updated. ? ?Review of Systems: ?Review of Systems  ?Constitutional: Negative.   ?HENT: Negative.    ?Eyes: Negative.    ?Respiratory: Negative.    ?Cardiovascular: Negative.   ?Gastrointestinal: Negative.   ?Genitourinary: Negative.   ?Musculoskeletal: Negative.   ?Skin: Negative.   ?Neurological:  Positive for dizziness.  ?Endo/Heme/Allergies: Negative.   ?Psychiatric/Behavioral: Negative.    ? ? ?Physical Exam: ? height is '5\' 7"'  (1.702 m) and weight is 69.4 kg. His oral temperature is 97.8 ?F (36.6 ?C). His blood pressure is 142/55 (abnormal) and his pulse is 67. His respiration is 17 and oxygen saturation is 100%.  ? ?Wt Readings from Last 3 Encounters:  ?10/27/21 69.4 kg  ?10/06/21 69.4 kg  ?09/15/21 69.9 kg  ? ? ?Physical Exam ?Vitals reviewed.  ?HENT:  ?   Head: Normocephalic and atraumatic.  ?Eyes:  ?   Pupils: Pupils are equal, round, and reactive to light.  ?Cardiovascular:  ?   Rate and Rhythm: Normal rate and regular rhythm.  ?   Heart sounds: Normal heart sounds.  ?Pulmonary:  ?   Effort: Pulmonary effort is normal.  ?   Breath sounds: Normal breath sounds.  ?Abdominal:  ?   General: Bowel sounds are normal.  ?   Palpations: Abdomen is soft.  ?Musculoskeletal:     ?   General: No tenderness or deformity. Normal range of motion.  ?   Cervical back: Normal range of motion.  ?Lymphadenopathy:  ?  Cervical: No cervical adenopathy.  ?Skin: ?   General: Skin is warm and dry.  ?   Findings: No erythema or rash.  ?Neurological:  ?   Mental Status: He is alert and oriented to person, place, and time.  ?Psychiatric:     ?   Behavior: Behavior normal.     ?   Thought Content: Thought content normal.     ?   Judgment: Judgment normal.  ? ? ?Lab Results  ?Component Value Date  ? WBC 6.3 10/27/2021  ? HGB 11.5 (L) 10/27/2021  ? HCT 34.3 (L) 10/27/2021  ? MCV 96.9 10/27/2021  ? PLT 87 (L) 10/27/2021  ? ?Lab Results  ?Component Value Date  ? FERRITIN 213 09/07/2021  ? IRON 41 (L) 09/07/2021  ? TIBC 256 09/07/2021  ? UIBC 215 09/07/2021  ? IRONPCTSAT 16 (L) 09/07/2021  ? ?Lab Results  ?Component Value Date  ? RETICCTPCT 3.6 (H)  09/07/2021  ? RBC 3.54 (L) 10/27/2021  ? ?No results found for: KPAFRELGTCHN, LAMBDASER, KAPLAMBRATIO ?No results found for: IGGSERUM, IGA, IGMSERUM ?No results found for: TOTALPROTELP, ALBUMINELP, A1GS, A2GS, BETS, BETA2SER, GAMS, MSPIKE, SPEI ?  Chemistry   ?   ?Component Value Date/Time  ? NA 139 10/27/2021 1000  ? NA 140 06/28/2011 0950  ? K 3.7 10/27/2021 1000  ? K 4.7 06/28/2011 0950  ? CL 106 10/27/2021 1000  ? CL 100 06/28/2011 0950  ? CO2 25 10/27/2021 1000  ? CO2 29 06/28/2011 0950  ? BUN 19 10/27/2021 1000  ? BUN 10 06/28/2011 0950  ? CREATININE 0.77 10/27/2021 1000  ? CREATININE 0.7 06/28/2011 0950  ?    ?Component Value Date/Time  ? CALCIUM 9.2 10/27/2021 1000  ? CALCIUM 9.0 06/28/2011 0950  ? ALKPHOS 120 10/27/2021 1000  ? ALKPHOS 110 (H) 12/22/2010 0850  ? AST 9 (L) 10/27/2021 1000  ? ALT 7 10/27/2021 1000  ? ALT 18 12/22/2010 0850  ? BILITOT 0.7 10/27/2021 1000  ?  ? ? ? ?Impression and Plan: Jerome Kelley is a very pleasant 81 yo caucasian gentleman with previous history of right lung cancer as well as colon cancer 10 years ago. He now has stage IIIb adenocarcinoma of the lung, right middle lobe.  ? ?We initially treated him with systemic chemotherapy.  He was not a candidate for surgery.  He was not candidate for curative chemoradiation therapy together. ? ?After systemic chemotherapy, we then relied on radiation and low-dose carboplatinum/Taxol.  ? ?He subsequently had another relapse.  I am sure his smoking is not helping.  We now have him on immunotherapy. ? ?We will go ahead with his third cycle of immunotherapy today.  I would like to try to do 4 cycles of immunotherapy and then do another PET scan on him. ? ?We will plan to get her back in 3 more weeks.   ? ?[name], MD ?3/16/202310:56 AM ? ?

## 2021-10-27 NOTE — Patient Instructions (Signed)
[name]  Discharge Instructions: ?Thank you for choosing [facility] to provide your oncology and hematology care.  ? ?If you have a lab appointment with the [facility], please go directly to the [facility] and check in at the registration area. ? ?Wear comfortable clothing and clothing appropriate for easy access to any Portacath or PICC line.  ? ?We strive to give you quality time with your provider. You may need to reschedule your appointment if you arrive late (15 or more minutes).  Arriving late affects you and other patients whose appointments are after yours.  Also, if you miss three or more appointments without notifying the office, you may be dismissed from the clinic at the provider?s discretion.    ?  ?For prescription refill requests, have your pharmacy contact our office and allow 72 hours for refills to be completed.   ? ?Today you received the following chemotherapy and/or immunotherapy agents Yervoy, Opdivo    ?  ?To help prevent nausea and vomiting after your treatment, we encourage you to take your nausea medication as directed. ? ?BELOW ARE SYMPTOMS THAT SHOULD BE REPORTED IMMEDIATELY: ?*FEVER GREATER THAN 100.4 F (38 ?C) OR HIGHER ?*CHILLS OR SWEATING ?*NAUSEA AND VOMITING THAT IS NOT CONTROLLED WITH YOUR NAUSEA MEDICATION ?*UNUSUAL SHORTNESS OF BREATH ?*UNUSUAL BRUISING OR BLEEDING ?*URINARY PROBLEMS (pain or burning when urinating, or frequent urination) ?*BOWEL PROBLEMS (unusual diarrhea, constipation, pain near the anus) ?TENDERNESS IN MOUTH AND THROAT WITH OR WITHOUT PRESENCE OF ULCERS (sore throat, sores in mouth, or a toothache) ?UNUSUAL RASH, SWELLING OR PAIN  ?UNUSUAL VAGINAL DISCHARGE OR ITCHING  ? ?Items with * indicate a potential emergency and should be followed up as soon as possible or go to the Emergency Department if any problems should occur. ? ?Please show the CHEMOTHERAPY ALERT CARD or IMMUNOTHERAPY ALERT CARD at check-in to  the Emergency Department and triage nurse. ?Should you have questions after your visit or need to cancel or reschedule your appointment, please contact [facility]  [phone] and follow the prompts.  Office hours are 8:00 a.m. to 4:30 p.m. Monday - Friday. Please note that voicemails left after 4:00 p.m. may not be returned until the following business day.  We are closed weekends and major holidays. You have access to a nurse at all times for urgent questions. Please call the main number to the clinic [phone] and follow the prompts. ? ?For any non-urgent questions, you may also contact your provider using MyChart. We now offer e-Visits for anyone 33 and older to request care online for non-urgent symptoms. For details visit [email or web address]. ?  ?Also download the MyChart app! Go to the app store, search "MyChart", open the app, select [facility], and log in with your MyChart username and password. ? ?Due to Covid, a mask is required upon entering the hospital/clinic. If you do not have a mask, one will be given to you upon arrival. For doctor visits, patients may have 1 support person aged 64 or older with them. For treatment visits, patients cannot have anyone with them due to current Covid guidelines and our immunocompromised population.  ?

## 2021-10-27 NOTE — Telephone Encounter (Signed)
Per 10/27/21 los - gave patient upcoming appointments - confirmed ?

## 2021-10-27 NOTE — Patient Instructions (Signed)

## 2021-10-27 NOTE — Progress Notes (Signed)
Ipilimumab (YERVOY) Patient Monitoring Assessment  ? ?Is the patient experiencing any of the following general symptoms?:  ?[] Difficulty performing normal activities ?[] Feeling sluggish or cold all the time ?[] Unusual weight gain ?[] Constant or unusual headaches ?[] Feeling dizzy or faint ?[] Changes in eyesight (blurry vision, double vision, or other vision problems) ?[] Changes in mood or behavior (ex: decreased sex drive, irritability, or forgetfulness) ?[] Starting new medications (ex: steroids, other medications that lower immune response) ?[x] Patient is not experiencing any of the general symptoms above.  ? ? ?Gastrointestinal  ?Patient is having 1-2 bowel movements each day.  ?Is this different from baseline? [] Yes [x] No ?Are your stools watery or do they have a foul smell? [] Yes [x] No ?Have you seen blood in your stools? [] Yes [x] No ?Are your stools dark, tarry, or sticky? [] Yes [x] No ?Are you having pain or tenderness in your belly? [] Yes [x] No ? ?Skin ?Does your skin itch? [] Yes [x] No ?Do you have a rash? [] Yes [] No ?Has your skin blistered and/or peeled? [] Yes [x] No ?Do you have sores in your mouth? [] Yes [x] No ? ?Hepatic ?Has your urine been dark or tea colored? [] Yes [x] No ?Have you noticed that your skin or the whites of your eyes are turning yellow? [] Yes [x] No ?Are you bleeding or bruising more easily than normal? [] Yes [x] No ?Are you nauseous and/or vomiting? [] Yes [x] No ?Do you have pain on the right side of your stomach? [] Yes [x] No ? ?Neurologic  ?Are you having unusual weakness of legs, arms, or face? [] Yes [x] No ?Are you having numbness or tingling in your hands or feet? [] Yes [x] No ? ?[facility]  ?

## 2021-10-27 NOTE — Progress Notes (Signed)
Ok to treat with platelets of 87 per Dr Zella Richer ?

## 2021-10-27 NOTE — Progress Notes (Signed)
Today will be cycle 2, day 1. ?Changed dates per Dr. Antonieta Pert instructions. ?

## 2021-10-28 LAB — T4: T4, Total: 9 ug/dL (ref 4.5–12.0)

## 2021-10-30 NOTE — Procedures (Signed)
Lumbosacral Transforaminal Epidural Steroid Injection - Sub-Pedicular Approach with Fluoroscopic Guidance ? ?Patient: Jerome Kelley      ?Date of Birth: 12-04-1940 ?MRN: [id number] ?PCP: Kelton Pillar, MD      ?Visit Date: 10/26/2021 ?  ?Universal Protocol:    ?Date/Time: 10/26/2021 ? ?Consent Given By: the patient ? ?Position: PRONE ? ?Additional Comments: ?Vital signs were monitored before and after the procedure. ?Patient was prepped and draped in the usual sterile fashion. ?The correct patient, procedure, and site was verified. ? ? ?Injection Procedure Details:  ? ?Procedure diagnoses: Lumbar radiculopathy [M54.16]   ? ?Meds Administered:  ?Meds ordered this encounter  ?Medications  ? methylPREDNISolone acetate (DEPO-MEDROL) injection 80 mg  ? ? ?Laterality: Right ? ?Location/Site: L5 ? ?Needle:5.0 in., 22 ga.  Short bevel or Quincke spinal needle ? ?Needle Placement: Transforaminal ? ?Findings: ?  ? -Comments: Excellent flow of contrast along the nerve, nerve root and into the epidural space. ? ?Procedure Details: ?After squaring off the end-plates to get a true AP view, the C-arm was positioned so that an oblique view of the foramen as noted above was visualized. The target area is just inferior to the "nose of the scotty dog" or sub pedicular. The soft tissues overlying this structure were infiltrated with 2-3 ml. of 1% Lidocaine without Epinephrine. ? ?The spinal needle was inserted toward the target using a "trajectory" view along the fluoroscope beam.  Under AP and lateral visualization, the needle was advanced so it did not puncture dura and was located close the 6 O'Clock position of the pedical in AP tracterory. Biplanar projections were used to confirm position. Aspiration was confirmed to be negative for CSF and/or blood. A 1-2 ml. volume of Isovue-250 was injected and flow of contrast was noted at each level. Radiographs were obtained for documentation purposes.  ? ?After attaining the desired flow of  contrast documented above, a 0.5 to 1.0 ml test dose of 0.25% Marcaine was injected into each respective transforaminal space.  The patient was observed for 90 seconds post injection.  After no sensory deficits were reported, and normal lower extremity motor function was noted,   the above injectate was administered so that equal amounts of the injectate were placed at each foramen (level) into the transforaminal epidural space. ? ? ?Additional Comments:  ?The patient tolerated the procedure well ?Dressing: 2 x 2 sterile gauze and Band-Aid ?  ? ?Post-procedure details: ?Patient was observed during the procedure. ?Post-procedure instructions were reviewed. ? ?Patient left the clinic in stable condition. ? ?

## 2021-10-30 NOTE — Progress Notes (Signed)
? ?Jerome Kelley - 81 y.o. male MRN [id number]  Date of birth: [birth year] ? ?Office Visit Note: ?Visit Date: 10/26/2021 ?PCP: Kelton Pillar, MD ?Referred by: Kelton Pillar, MD ? ?Subjective: ?Chief Complaint  ?Patient presents with  ? Lower Back - Pain  ? Right Leg - Pain  ? ?HPI:  Jerome Kelley is a 81 y.o. male who comes in today at the request of Benita Stabile, PA-C for planned Right L5-S1 Lumbar Transforaminal epidural steroid injection with fluoroscopic guidance.  The patient has failed conservative care including home exercise, medications, time and activity modification.  This injection will be diagnostic and hopefully therapeutic.  Please see requesting physician notes for further details and justification. MRI reviewed with images and spine model.  MRI reviewed in the note below.  ? ?ROS Otherwise per HPI. ? ?Assessment & Plan: ?Visit Diagnoses:  ?  ICD-10-CM   ?1. Lumbar radiculopathy  M54.16 XR C-ARM NO REPORT  ?  Epidural Steroid injection  ?  methylPREDNISolone acetate (DEPO-MEDROL) injection 80 mg  ?  ?  ?Plan: No additional findings.  ? ?Meds & Orders:  ?Meds ordered this encounter  ?Medications  ? methylPREDNISolone acetate (DEPO-MEDROL) injection 80 mg  ?  ?Orders Placed This Encounter  ?Procedures  ? XR C-ARM NO REPORT  ? Epidural Steroid injection  ?  ?Follow-up: Return if symptoms worsen or fail to improve.  ? ?Procedures: ?No procedures performed  ?Lumbosacral Transforaminal Epidural Steroid Injection - Sub-Pedicular Approach with Fluoroscopic Guidance ? ?Patient: Jerome Kelley      ?Date of Birth: 1941/04/01 ?MRN: [id number] ?PCP: Kelton Pillar, MD      ?Visit Date: 10/26/2021 ?  ?Universal Protocol:    ?Date/Time: 10/26/2021 ? ?Consent Given By: the patient ? ?Position: PRONE ? ?Additional Comments: ?Vital signs were monitored before and after the procedure. ?Patient was prepped and draped in the usual sterile fashion. ?The correct patient, procedure, and site was verified. ? ? ?Injection  Procedure Details:  ? ?Procedure diagnoses: Lumbar radiculopathy [M54.16]   ? ?Meds Administered:  ?Meds ordered this encounter  ?Medications  ? methylPREDNISolone acetate (DEPO-MEDROL) injection 80 mg  ? ? ?Laterality: Right ? ?Location/Site: L5 ? ?Needle:5.0 in., 22 ga.  Short bevel or Quincke spinal needle ? ?Needle Placement: Transforaminal ? ?Findings: ?  ? -Comments: Excellent flow of contrast along the nerve, nerve root and into the epidural space. ? ?Procedure Details: ?After squaring off the end-plates to get a true AP view, the C-arm was positioned so that an oblique view of the foramen as noted above was visualized. The target area is just inferior to the "nose of the scotty dog" or sub pedicular. The soft tissues overlying this structure were infiltrated with 2-3 ml. of 1% Lidocaine without Epinephrine. ? ?The spinal needle was inserted toward the target using a "trajectory" view along the fluoroscope beam.  Under AP and lateral visualization, the needle was advanced so it did not puncture dura and was located close the 6 O'Clock position of the pedical in AP tracterory. Biplanar projections were used to confirm position. Aspiration was confirmed to be negative for CSF and/or blood. A 1-2 ml. volume of Isovue-250 was injected and flow of contrast was noted at each level. Radiographs were obtained for documentation purposes.  ? ?After attaining the desired flow of contrast documented above, a 0.5 to 1.0 ml test dose of 0.25% Marcaine was injected into each respective transforaminal space.  The patient was observed for 90 seconds post injection.  After no  sensory deficits were reported, and normal lower extremity motor function was noted,   the above injectate was administered so that equal amounts of the injectate were placed at each foramen (level) into the transforaminal epidural space. ? ? ?Additional Comments:  ?The patient tolerated the procedure well ?Dressing: 2 x 2 sterile gauze and Band-Aid ?   ? ?Post-procedure details: ?Patient was observed during the procedure. ?Post-procedure instructions were reviewed. ? ?Patient left the clinic in stable condition. ?  ? ?Clinical History: ?MRI LUMBAR SPINE WITHOUT CONTRAST ?  ?TECHNIQUE: ?Multiplanar, multisequence MR imaging of the lumbar spine was ?performed. No intravenous contrast was administered. ?  ?COMPARISON:  01/01/2019 lumbar spine radiographs ?  ?FINDINGS: ?Segmentation:  Standard. ?  ?Alignment:  Physiologic. ?  ?Vertebrae:  No fracture, evidence of discitis, or bone lesion. ?  ?Conus medullaris and cauda equina: Conus extends to the L2 level. ?Conus and cauda equina appear normal. ?  ?Paraspinal and other soft tissues: Negative. ?  ?Disc levels: ?  ?T11-12: Normal. ?  ?T12-L1: Normal. ?  ?L1-2: Normal. ?  ?L2-3: Disc space narrowing without spinal canal or neural foraminal ?stenosis. Normal facets. ?  ?L3-4: There is disc space narrowing with mild bulge. No central ?spinal canal stenosis. Mild right foraminal stenosis. ?  ?L4-5: Mild disc bulge with normal facets. No spinal canal stenosis. ?Mild right foraminal stenosis. ?  ?L5-S1: Disc space narrowing with mild bulge. No spinal canal ?stenosis. Moderate bilateral neural foraminal stenosis. ?  ?IMPRESSION: ?1. Moderate bilateral L5-S1 neural foraminal stenosis. ?2. Mild right foraminal stenosis at L3-4 and L4-5. ?  ?  ?Electronically Signed ?  By: Ulyses Jarred M.D. ?  On: 01/18/2019 05:27  ? ? ? ?Objective:  VS:  HT:    WT:   BMI:     BP:(!) 101/53  HR:78bpm  TEMP: ( )  RESP:  ?Physical Exam ?Vitals and nursing note reviewed.  ?Constitutional:   ?   General: He is not in acute distress. ?   Appearance: Normal appearance. He is not ill-appearing.  ?HENT:  ?   Head: Normocephalic and atraumatic.  ?   Right Ear: External ear normal.  ?   Left Ear: External ear normal.  ?   Nose: No congestion.  ?Eyes:  ?   Extraocular Movements: Extraocular movements intact.  ?Cardiovascular:  ?   Rate and Rhythm:  Normal rate.  ?   Pulses: Normal pulses.  ?Pulmonary:  ?   Effort: Pulmonary effort is normal. No respiratory distress.  ?Abdominal:  ?   General: There is no distension.  ?   Palpations: Abdomen is soft.  ?Musculoskeletal:     ?   General: No tenderness or signs of injury.  ?   Cervical back: Neck supple.  ?   Right lower leg: No edema.  ?   Left lower leg: No edema.  ?   Comments: Patient has good distal strength without clonus.  ?Skin: ?   Findings: No erythema or rash.  ?Neurological:  ?   General: No focal deficit present.  ?   Mental Status: He is alert and oriented to person, place, and time.  ?   Sensory: No sensory deficit.  ?   Motor: No weakness or abnormal muscle tone.  ?   Coordination: Coordination normal.  ?Psychiatric:     ?   Mood and Affect: Mood normal.     ?   Behavior: Behavior normal.  ?  ? ?Imaging: ?No results found. ?

## 2021-11-18 ENCOUNTER — Inpatient Hospital Stay: Payer: Medicare Other

## 2021-11-18 ENCOUNTER — Inpatient Hospital Stay: Payer: Medicare Other | Attending: Hematology & Oncology

## 2021-11-18 ENCOUNTER — Encounter: Payer: Self-pay | Admitting: Hematology & Oncology

## 2021-11-18 ENCOUNTER — Inpatient Hospital Stay (HOSPITAL_BASED_OUTPATIENT_CLINIC_OR_DEPARTMENT_OTHER): Payer: Medicare Other | Admitting: Hematology & Oncology

## 2021-11-18 ENCOUNTER — Encounter: Payer: Self-pay | Admitting: *Deleted

## 2021-11-18 ENCOUNTER — Other Ambulatory Visit: Payer: Self-pay

## 2021-11-18 VITALS — BP 146/55 | HR 64 | Temp 97.6°F | Resp 18 | Ht 67.0 in | Wt 154.0 lb

## 2021-11-18 DIAGNOSIS — K148 Other diseases of tongue: Secondary | ICD-10-CM

## 2021-11-18 DIAGNOSIS — C3411 Malignant neoplasm of upper lobe, right bronchus or lung: Secondary | ICD-10-CM

## 2021-11-18 DIAGNOSIS — C342 Malignant neoplasm of middle lobe, bronchus or lung: Secondary | ICD-10-CM | POA: Insufficient documentation

## 2021-11-18 DIAGNOSIS — C772 Secondary and unspecified malignant neoplasm of intra-abdominal lymph nodes: Secondary | ICD-10-CM | POA: Diagnosis not present

## 2021-11-18 DIAGNOSIS — Z79899 Other long term (current) drug therapy: Secondary | ICD-10-CM | POA: Diagnosis not present

## 2021-11-18 DIAGNOSIS — Z5112 Encounter for antineoplastic immunotherapy: Secondary | ICD-10-CM | POA: Diagnosis not present

## 2021-11-18 LAB — CBC WITH DIFFERENTIAL (CANCER CENTER ONLY)
Abs Immature Granulocytes: 0.03 10*3/uL (ref 0.00–0.07)
Basophils Absolute: 0 10*3/uL (ref 0.0–0.1)
Basophils Relative: 0 %
Eosinophils Absolute: 0.2 10*3/uL (ref 0.0–0.5)
Eosinophils Relative: 4 %
HCT: 34.1 % — ABNORMAL LOW (ref 39.0–52.0)
Hemoglobin: 11.4 g/dL — ABNORMAL LOW (ref 13.0–17.0)
Immature Granulocytes: 1 %
Lymphocytes Relative: 22 %
Lymphs Abs: 0.9 10*3/uL (ref 0.7–4.0)
MCH: 32.9 pg (ref 26.0–34.0)
MCHC: 33.4 g/dL (ref 30.0–36.0)
MCV: 98.6 fL (ref 80.0–100.0)
Monocytes Absolute: 0.4 10*3/uL (ref 0.1–1.0)
Monocytes Relative: 9 %
Neutro Abs: 2.6 10*3/uL (ref 1.7–7.7)
Neutrophils Relative %: 64 %
Platelet Count: 81 10*3/uL — ABNORMAL LOW (ref 150–400)
RBC: 3.46 MIL/uL — ABNORMAL LOW (ref 4.22–5.81)
RDW: 14.8 % (ref 11.5–15.5)
WBC Count: 4 10*3/uL (ref 4.0–10.5)
nRBC: 0 % (ref 0.0–0.2)

## 2021-11-18 LAB — CMP (CANCER CENTER ONLY)
ALT: 8 U/L (ref 0–44)
AST: 9 U/L — ABNORMAL LOW (ref 15–41)
Albumin: 3.5 g/dL (ref 3.5–5.0)
Alkaline Phosphatase: 111 U/L (ref 38–126)
Anion gap: 5 (ref 5–15)
BUN: 12 mg/dL (ref 8–23)
CO2: 27 mmol/L (ref 22–32)
Calcium: 8.7 mg/dL — ABNORMAL LOW (ref 8.9–10.3)
Chloride: 106 mmol/L (ref 98–111)
Creatinine: 0.78 mg/dL (ref 0.61–1.24)
GFR, Estimated: >60 mL/min (ref >60)
Glucose, Bld: 122 mg/dL — ABNORMAL HIGH (ref 70–99)
Potassium: 3.6 mmol/L (ref 3.5–5.1)
Sodium: 138 mmol/L (ref 135–145)
Total Bilirubin: 0.6 mg/dL (ref 0.3–1.2)
Total Protein: 6 g/dL — ABNORMAL LOW (ref 6.5–8.1)

## 2021-11-18 LAB — TSH: TSH: 3.335 u[IU]/mL (ref 0.320–4.118)

## 2021-11-18 MED ORDER — SODIUM CHLORIDE 0.9 % IV SOLN: Freq: Once | INTRAVENOUS | Status: AC

## 2021-11-18 MED ORDER — FAMOTIDINE IN NACL 20-0.9 MG/50ML-% IV SOLN
20.0000 mg | Freq: Once | INTRAVENOUS | Status: AC
  Administered 2021-11-18: 20 mg via INTRAVENOUS
  Filled 2021-11-18: qty 50

## 2021-11-18 MED ORDER — SODIUM CHLORIDE 0.9 % IV SOLN
1.0000 mg/kg | Freq: Once | INTRAVENOUS | Status: AC
  Administered 2021-11-18: 70 mg via INTRAVENOUS
  Filled 2021-11-18: qty 14

## 2021-11-18 MED ORDER — SODIUM CHLORIDE 0.9 % IV SOLN
2.8100 mg/kg | Freq: Once | INTRAVENOUS | Status: AC
  Administered 2021-11-18: 200 mg via INTRAVENOUS
  Filled 2021-11-18: qty 20

## 2021-11-18 MED ORDER — SODIUM CHLORIDE 0.9% FLUSH
10.0000 mL | INTRAVENOUS | Status: DC | PRN
  Administered 2021-11-18: 10 mL

## 2021-11-18 MED ORDER — DIPHENHYDRAMINE HCL 50 MG/ML IJ SOLN
25.0000 mg | Freq: Once | INTRAMUSCULAR | Status: AC
  Administered 2021-11-18: 25 mg via INTRAVENOUS
  Filled 2021-11-18: qty 1

## 2021-11-18 MED ORDER — HEPARIN SOD (PORK) LOCK FLUSH 100 UNIT/ML IV SOLN
500.0000 [IU] | Freq: Once | INTRAVENOUS | Status: AC | PRN
  Administered 2021-11-18: 500 [IU]

## 2021-11-18 NOTE — Progress Notes (Signed)
Ok to treat with platelets of 81,000 per Dr Marin Olp.   ?Ipilimumab (YERVOY) Patient Monitoring Assessment  ? ?Is the patient experiencing any of the following general symptoms?:  ?[] Difficulty performing normal activities ?[] Feeling sluggish or cold all the time ?[] Unusual weight gain ?[] Constant or unusual headaches ?[] Feeling dizzy or faint ?[] Changes in eyesight (blurry vision, double vision, or other vision problems) ?[] Changes in mood or behavior (ex: decreased sex drive, irritability, or forgetfulness) ?[] Starting new medications (ex: steroids, other medications that lower immune response) ?[x] Patient is not experiencing any of the general symptoms above.  ? ? ?Gastrointestinal  ?Patient is having 1 bowel movements each day.  ?Is this different from baseline? [] Yes [x] No ?Are your stools watery or do they have a foul smell? [] Yes [x] No ?Have you seen blood in your stools? [] Yes [x] No ?Are your stools dark, tarry, or sticky? [] Yes [x] No ?Are you having pain or tenderness in your belly? [] Yes [x] No ? ?Skin ?Does your skin itch? [] Yes [x] No ?Do you have a rash? [] Yes [x] No ?Has your skin blistered and/or peeled? [] Yes [x] No ?Do you have sores in your mouth? [] Yes [x] No ? ?Hepatic ?Has your urine been dark or tea colored? [] Yes [x] No ?Have you noticed that your skin or the whites of your eyes are turning yellow? [] Yes [x] No ?Are you bleeding or bruising more easily than normal? [] Yes [x] No ?Are you nauseous and/or vomiting? [] Yes [x] No ?Do you have pain on the right side of your stomach? [] Yes [x] No ? ?Neurologic  ?Are you having unusual weakness of legs, arms, or face? [] Yes [x] No ?Are you having numbness or tingling in your hands or feet? [] Yes [x] No ? ?Jerome Kelley  ?

## 2021-11-18 NOTE — Patient Instructions (Signed)
[facility] [place]  Discharge Instructions: ?Thank you for choosing [facility] to provide your oncology and hematology care.  ? ?If you have a lab appointment with the [facility], please go directly to the [facility] and check in at the registration area. ? ?Wear comfortable clothing and clothing appropriate for easy access to any Portacath or PICC line.  ? ?We strive to give you quality time with your provider. You may need to reschedule your appointment if you arrive late (15 or more minutes).  Arriving late affects you and other patients whose appointments are after yours.  Also, if you miss three or more appointments without notifying the office, you may be dismissed from the clinic at the provider?s discretion.    ?  ?For prescription refill requests, have your pharmacy contact our office and allow 72 hours for refills to be completed.   ? ?Today you received the following chemotherapy and/or immunotherapy agents Opdivo, Yervoy    ?  ?To help prevent nausea and vomiting after your treatment, we encourage you to take your nausea medication as directed. ? ?BELOW ARE SYMPTOMS THAT SHOULD BE REPORTED IMMEDIATELY: ?*FEVER GREATER THAN 100.4 F (38 ?C) OR HIGHER ?*CHILLS OR SWEATING ?*NAUSEA AND VOMITING THAT IS NOT CONTROLLED WITH YOUR NAUSEA MEDICATION ?*UNUSUAL SHORTNESS OF BREATH ?*UNUSUAL BRUISING OR BLEEDING ?*URINARY PROBLEMS (pain or burning when urinating, or frequent urination) ?*BOWEL PROBLEMS (unusual diarrhea, constipation, pain near the anus) ?TENDERNESS IN MOUTH AND THROAT WITH OR WITHOUT PRESENCE OF ULCERS (sore throat, sores in mouth, or a toothache) ?UNUSUAL RASH, SWELLING OR PAIN  ?UNUSUAL VAGINAL DISCHARGE OR ITCHING  ? ?Items with * indicate a potential emergency and should be followed up as soon as possible or go to the Emergency Department if any problems should occur. ? ?Please show the CHEMOTHERAPY ALERT CARD or IMMUNOTHERAPY ALERT CARD at check-in to  the Emergency Department and triage nurse. ?Should you have questions after your visit or need to cancel or reschedule your appointment, please contact [facility]  [phone] and follow the prompts.  Office hours are 8:00 a.m. to 4:30 p.m. Monday - Friday. Please note that voicemails left after 4:00 p.m. may not be returned until the following business day.  We are closed weekends and major holidays. You have access to a nurse at all times for urgent questions. Please call the main number to the clinic [phone] and follow the prompts. ? ?For any non-urgent questions, you may also contact your provider using MyChart. We now offer e-Visits for anyone 11 and older to request care online for non-urgent symptoms. For details visit [email or web address]. ?  ?Also download the MyChart app! Go to the app store, search "MyChart", open the app, select , and log in with your MyChart username and password. ? ?Due to Covid, a mask is required upon entering the hospital/clinic. If you do not have a mask, one will be given to you upon arrival. For doctor visits, patients may have 1 support person aged 50 or older with them. For treatment visits, patients cannot have anyone with them due to current Covid guidelines and our immunocompromised population.  ?

## 2021-11-18 NOTE — Patient Instructions (Signed)

## 2021-11-18 NOTE — Progress Notes (Signed)
?Hematology and Oncology Follow Up Visit ? ?Rush Farmer ?[id number] ?1941-01-21 81 y.o. ?11/18/2021 ? ? 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  ?Current Therapy:        ?Carboplatin/Alimta/Pembrolizumab - started 05/10/2020- s/p cycle 6 ?Radiation with weekly carboplatin/Taxol-start on 11/01/2020 -- completed on 12/20/2020 ?Yervoy/Opdivo --s/p cycle #3  -- start on 09/15/2021 ? ?  ?Interim History:  Mr. Sporer is here today for follow-up.  He is doing okay.  He is having back issues.  This is not from his cancer.  He has arthritis.  I think he had a an injection which seemed to help a little bit recently. ? ?On Easter, will be his anniversary.  I am sure that he will have a wonderful day on his anniversary/Easter. ? ?He has had no problems with diarrhea.  He has had no rashes.  He has had no leg swelling.  There is been no issues with headache.  He has had no bruising or bleeding. ? ?Still think he is smoking a little bit. ? ?Overall, I was his performance status is ECOG 1.   ? ? ?Medications:  ?Allergies as of 11/18/2021   ?No Known Allergies ?  ? ?  ?Medication List  ?  ? ?  ? Accurate as of November 18, 2021 10:48 AM. If you have any questions, ask your nurse or doctor.  ?  ?  ? ?  ? ?aspirin 81 MG chewable tablet ?Chew 1 tablet (81 mg total) by mouth 2 (two) times daily. ?What changed: when to take this ?  ?benazepril 20 MG tablet ?Commonly known as: LOTENSIN ?Take 20 mg by mouth daily. ?  ?vitamin B-12 1000 MCG tablet ?Commonly known as: CYANOCOBALAMIN ?Take 1 tablet (1,000 mcg total) by mouth daily. ?  ? ?  ? ? ?Allergies: No Known Allergies ? ?Past Medical History, Surgical history, Social history, and  Family History were reviewed and updated. ? ?Review of Systems: ?Review of Systems  ?Constitutional: Negative.   ?HENT: Negative.    ?Eyes: Negative.   ?Respiratory: Negative.    ?Cardiovascular: Negative.   ?Gastrointestinal: Negative.   ?Genitourinary: Negative.   ?Musculoskeletal: Negative.   ?Skin: Negative.   ?Neurological:  Positive for dizziness.  ?Endo/Heme/Allergies: Negative.   ?Psychiatric/Behavioral: Negative.    ? ? ?Physical Exam: ? height is 5' 7" (1.702 m) and weight is 154 lb (69.9 kg). His oral temperature is 97.6 ?F (36.4 ?C). His blood pressure is 146/55 (abnormal) and his pulse is 64. His respiration is 18 and oxygen saturation is 100%.  ? ?Wt Readings from Last 3 Encounters:  ?11/18/21 154 lb (69.9 kg)  ?10/27/21 153 lb (69.4 kg)  ?10/06/21 153 lb 0.6 oz (69.4 kg)  ? ? ?Physical Exam ?Vitals reviewed.  ?HENT:  ?   Head: Normocephalic and atraumatic.  ?Eyes:  ?   Pupils: Pupils are equal, round, and reactive to light.  ?Cardiovascular:  ?   Rate and Rhythm: Normal rate and regular rhythm.  ?   Heart sounds: Normal heart sounds.  ?Pulmonary:  ?   Effort: Pulmonary effort is normal.  ?   Breath sounds: Normal breath sounds.  ?Abdominal:  ?   General: Bowel sounds are normal.  ?  Palpations: Abdomen is soft.  ?Musculoskeletal:     ?   General: No tenderness or deformity. Normal range of motion.  ?   Cervical back: Normal range of motion.  ?Lymphadenopathy:  ?   Cervical: No cervical adenopathy.  ?Skin: ?   General: Skin is warm and dry.  ?   Findings: No erythema or rash.  ?Neurological:  ?   Mental Status: He is alert and oriented to person, place, and time.  ?Psychiatric:     ?   Behavior: Behavior normal.     ?   Thought Content: Thought content normal.     ?   Judgment: Judgment normal.  ? ? ?Lab Results  ?Component Value Date  ? WBC 4.0 11/18/2021  ? HGB 11.4 (L) 11/18/2021  ? HCT 34.1 (L) 11/18/2021  ? MCV 98.6 11/18/2021  ? PLT 81 (L) 11/18/2021  ? ?Lab Results  ?Component Value Date  ?  FERRITIN 213 09/07/2021  ? IRON 47 10/27/2021  ? TIBC 244 (L) 10/27/2021  ? UIBC 197 10/27/2021  ? IRONPCTSAT 19 10/27/2021  ? ?Lab Results  ?Component Value Date  ? RETICCTPCT 3.6 (H) 09/07/2021  ? RBC 3.46 (L) 11/18/2021  ? ?No results found for: KPAFRELGTCHN, LAMBDASER, KAPLAMBRATIO ?No results found for: IGGSERUM, IGA, IGMSERUM ?No results found for: TOTALPROTELP, ALBUMINELP, A1GS, A2GS, BETS, BETA2SER, GAMS, MSPIKE, SPEI ?  Chemistry   ?   ?Component Value Date/Time  ? NA 138 11/18/2021 0952  ? NA 140 06/28/2011 0950  ? K 3.6 11/18/2021 0952  ? K 4.7 06/28/2011 0950  ? CL 106 11/18/2021 0952  ? CL 100 06/28/2011 0950  ? CO2 27 11/18/2021 0952  ? CO2 29 06/28/2011 0950  ? BUN 12 11/18/2021 0952  ? BUN 10 06/28/2011 0950  ? CREATININE 0.78 11/18/2021 0952  ? CREATININE 0.7 06/28/2011 0950  ?    ?Component Value Date/Time  ? CALCIUM 8.7 (L) 11/18/2021 0938  ? CALCIUM 9.0 06/28/2011 0950  ? ALKPHOS 111 11/18/2021 0952  ? ALKPHOS 110 (H) 12/22/2010 0850  ? AST 9 (L) 11/18/2021 1829  ? ALT 8 11/18/2021 0952  ? ALT 18 12/22/2010 0850  ? BILITOT 0.6 11/18/2021 9371  ?  ? ? ? ?Impression and Plan: Mr. Epperly is a very pleasant 81 yo caucasian gentleman with previous history of right lung cancer as well as colon cancer 10 years ago. He now has stage IIIb adenocarcinoma of the lung, right middle lobe.  ? ?We initially treated him with systemic chemotherapy.  He was not a candidate for surgery.  He was not candidate for curative chemoradiation therapy together. ? ?After systemic chemotherapy, we then relied on radiation and low-dose carboplatinum/Taxol.  ? ?He subsequently had another relapse.  I am sure his smoking is not helping.  We now have him on immunotherapy. ? ?We will go ahead with his 4th cycle of immunotherapy today.  After this cycle, we will set him up with a PET scan and see how everything looks.  Hopefully, he has had a response.   ? ?We will plan to get her back in 4 more weeks.   ? ?[name],  MD ?4/7/202310:48 AM ? ?

## 2021-11-19 LAB — T4: T4, Total: 6.8 ug/dL (ref 4.5–12.0)

## 2021-11-22 ENCOUNTER — Other Ambulatory Visit: Payer: Self-pay | Admitting: Hematology & Oncology

## 2021-11-23 ENCOUNTER — Encounter: Payer: Self-pay | Admitting: Hematology & Oncology

## 2021-11-23 MED ORDER — LIDOCAINE-PRILOCAINE 2.5-2.5 % EX CREA: 1.0000 "application " | TOPICAL_CREAM | CUTANEOUS | 0 refills | Status: DC | PRN

## 2021-11-24 ENCOUNTER — Encounter (HOSPITAL_COMMUNITY): Payer: Self-pay | Admitting: Surgery

## 2021-11-29 ENCOUNTER — Other Ambulatory Visit: Payer: Self-pay

## 2021-11-29 ENCOUNTER — Encounter: Payer: Self-pay | Admitting: Hematology & Oncology

## 2021-11-29 ENCOUNTER — Ambulatory Visit
Admission: EM | Admit: 2021-11-29 | Discharge: 2021-11-29 | Disposition: A | Discharge status: Home / Self Care | Payer: Medicare Other | Attending: Internal Medicine | Admitting: Internal Medicine

## 2021-11-29 ENCOUNTER — Encounter: Payer: Self-pay | Admitting: Emergency Medicine

## 2021-11-29 DIAGNOSIS — R21 Rash and other nonspecific skin eruption: Secondary | ICD-10-CM

## 2021-11-29 DIAGNOSIS — L509 Urticaria, unspecified: Secondary | ICD-10-CM | POA: Diagnosis not present

## 2021-11-29 MED ORDER — TRIAMCINOLONE ACETONIDE 0.1 % EX CREA: 1.0000 "application " | TOPICAL_CREAM | Freq: Two times a day (BID) | CUTANEOUS | 0 refills | Status: DC

## 2021-11-29 NOTE — Discharge Instructions (Signed)
You have been prescribed a cream to help alleviate itching.  Please follow-up if symptoms persist or worsen. ?

## 2021-11-29 NOTE — ED Triage Notes (Signed)
Pt here for rash to bilateral arms and back that is itchy; pt sts had chemo treatment 1 week ago but was same as has had x 1 year  ?

## 2021-11-29 NOTE — ED Provider Notes (Signed)
?[facility] ? ? ? ?CSN: [id number] ?Arrival date & time: 11/29/21  0931 ? ? ?  ? ?History   ?Chief Complaint ?Chief Complaint  ?Patient presents with  ? Rash  ? ? ?HPI ?Jerome Kelley is a 81 y.o. male.  ? ?Patient presents with itchy rash to bilateral arms and back that has been present for approximately 1 week.  Denies any recent changes in lotions, soaps, detergents, foods, etc.  He does report that he started taking Caltrate which is a new medication approximately 3 weeks ago but is not sure if symptoms are related to this.  Pain is not present.  Denies any associated fevers, body aches, chills. ? ? ?Rash ? ?Past Medical History:  ?Diagnosis Date  ? Colon cancer ([place]) 09/18/2011  ? Goals of care, counseling/discussion 03/15/2020  ? History of chemotherapy   ? History of radiation therapy 11/04/2020-12/15/2020  ?  mediastinal and right hilar region    Dr Gery Pray  ? Hypertension   ? Lung cancer, upper lobe ([place]) 09/18/2011  ? Malignant neoplasm of upper lobe of right lung ([place]) 04/30/2020  ? ? ?Patient Active Problem List  ? Diagnosis Date Noted  ? COPD, mild ([place]) 01/24/2021  ? Port-A-Cath in place 10/29/2020  ? Malignant neoplasm of upper lobe of right lung ([place]) 04/30/2020  ? Goals of care, counseling/discussion 03/15/2020  ? Hip fracture ([place]) 10/18/2018  ? Essential hypertension 10/18/2018  ? Closed fracture of neck of right femur ([place])   ? Colon cancer ([place]) 09/18/2011  ? ? ?Past Surgical History:  ?Procedure Laterality Date  ? BRONCHIAL WASHINGS  03/19/2020  ? Procedure: BRONCHIAL WASHINGS;  Surgeon: Juanito Doom, MD;  Location: [facility];  Service: Cardiopulmonary;;  ? COLON SURGERY  2010  ? ENDOBRONCHIAL ULTRASOUND N/A 03/19/2020  ? Procedure: ENDOBRONCHIAL ULTRASOUND;  Surgeon: Juanito Doom, MD;  Location: [facility];  Service: Cardiopulmonary;  Laterality: N/A;  ? FINE NEEDLE ASPIRATION  03/19/2020  ? Procedure: FINE NEEDLE ASPIRATION;  Surgeon: Juanito Doom, MD;  Location: [facility]  ENDOSCOPY;  Service: Cardiopulmonary;;  ? FINE NEEDLE ASPIRATION BIOPSY  04/02/2020  ? Procedure: FINE NEEDLE ASPIRATION BIOPSY;  Surgeon: Laurin Coder, MD;  Location: [facility];  Service: Pulmonary;;  ? IR IMAGING GUIDED PORT INSERTION  05/07/2020  ? LUNG SURGERY    ? right side   ? port a cath insertion    ? port a cath removed    ? TOTAL HIP ARTHROPLASTY Right 10/18/2018  ? Procedure: TOTAL HIP ARTHROPLASTY ANTERIOR APPROACH;  Surgeon: Mcarthur Rossetti, MD;  Location: [facility];  Service: Orthopedics;  Laterality: Right;  ? VIDEO BRONCHOSCOPY N/A 03/19/2020  ? Procedure: VIDEO BRONCHOSCOPY WITHOUT FLUORO;  Surgeon: Juanito Doom, MD;  Location: [facility];  Service: Cardiopulmonary;  Laterality: N/A;  ? VIDEO BRONCHOSCOPY N/A 04/02/2020  ? Procedure: VIDEO BRONCHOSCOPY WITH EBUS;  Surgeon: Laurin Coder, MD;  Location: [facility];  Service: Pulmonary;  Laterality: N/A;  ? ? ? ? ? ?Home Medications   ? ?Prior to Admission medications   ?Medication Sig Start Date End Date Taking? Authorizing Provider  ?triamcinolone cream (KENALOG) 0.1 % Apply 1 application. topically 2 (two) times daily. 11/29/21  Yes Teodora Medici, FNP  ?aspirin 81 MG chewable tablet Chew 1 tablet (81 mg total) by mouth 2 (two) times daily. ?Patient taking differently: Chew 81 mg by mouth daily. 10/20/18   Pete Pelt, PA-C  ?benazepril (LOTENSIN) 20 MG tablet Take 20 mg  by mouth daily. 07/28/18   [provider]  ?lidocaine-prilocaine (EMLA) cream Apply 1 application. topically as needed. Apply 1 hour prior to procedure. 11/23/21   [name], MD  ?LORazepam (ATIVAN) 0.5 MG tablet TAKE 1 TABLET BY MOUTH EVERY 8 HOURS AS NEEDED FOR ANXIETY 11/23/21   [name], MD  ?vitamin B-12 (CYANOCOBALAMIN) 1000 MCG tablet Take 1 tablet (1,000 mcg total) by mouth daily. 10/20/18   Patrecia Pour, MD  ? ? ?Family History ?History reviewed. No pertinent family history. ? ?Social History ?Social History  ? ?Tobacco Use  ?  Smoking status: Every Day  ?  Packs/day: 1.00  ?  Years: 58.00  ?  Pack years: 58.00  ?  Types: Cigarettes  ?  Start date: 09/03/1956  ? Smokeless tobacco: Never  ? Tobacco comments:  ?  does not want to quit  ?Vaping Use  ? Vaping Use: Never used  ?Substance Use Topics  ? Alcohol use: Yes  ?  Alcohol/week: 3.0 standard drinks  ?  Types: 3 Cans of beer per week  ?  Comment: Fridays only  ? Drug use: Never  ? ? ? ?Allergies   ?Patient has no known allergies. ? ? ?Review of Systems ?Review of Systems ?Per HPI ? ?Physical Exam ?Triage Vital Signs ?ED Triage Vitals  ?Enc Vitals Group  ?   BP 11/29/21 1006 136/67  ?   Pulse Rate 11/29/21 1006 70  ?   Resp 11/29/21 1006 18  ?   Temp 11/29/21 1006 (!) 97.3 ?F (36.3 ?C)  ?   Temp Source 11/29/21 1006 Oral  ?   SpO2 11/29/21 1006 93 %  ?   Weight --   ?   Height --   ?   Head Circumference --   ?   Peak Flow --   ?   Pain Score 11/29/21 1007 0  ?   Pain Loc --   ?   Pain Edu? --   ?   Excl. in [place]? --   ? ?No data found. ? ?Updated Vital Signs ?BP 136/67 (BP Location: Left Arm)   Pulse 70   Temp (!) 97.3 ?F (36.3 ?C) (Oral)   Resp 18   SpO2 95%  ? ?Visual Acuity ?Right Eye Distance:   ?Left Eye Distance:   ?Bilateral Distance:   ? ?Right Eye Near:   ?Left Eye Near:    ?Bilateral Near:    ? ?Physical Exam ?Constitutional:   ?   General: He is not in acute distress. ?   Appearance: Normal appearance. He is not toxic-appearing or diaphoretic.  ?HENT:  ?   Head: Normocephalic and atraumatic.  ?Eyes:  ?   Extraocular Movements: Extraocular movements intact.  ?   Conjunctiva/sclera: Conjunctivae normal.  ?Pulmonary:  ?   Effort: Pulmonary effort is normal.  ?Skin: ?   Findings: Rash present.  ?   Comments: Maculopapular rash present to bilateral forearms, lower back, shoulders.  ?Neurological:  ?   General: No focal deficit present.  ?   Mental Status: He is alert and oriented to person, place, and time. Mental status is at baseline.  ?Psychiatric:     ?   Mood and Affect: Mood  normal.     ?   Behavior: Behavior normal.     ?   Thought Content: Thought content normal.     ?   Judgment: Judgment normal.  ? ? ? ?UC Treatments / Results  ?Labs ?(  all labs ordered are listed, but only abnormal results are displayed) ?Labs Reviewed - No data to display ? ?EKG ? ? ?Radiology ?No results found. ? ?Procedures ?Procedures (including critical care time) ? ?Medications Ordered in UC ?Medications - No data to display ? ?Initial Impression / Assessment and Plan / UC Course  ?I have reviewed the triage vital signs and the nursing notes. ? ?Pertinent labs & imaging results that were available during my care of the patient were reviewed by me and considered in my medical decision making (see chart for details). ? ?  ? ?Rash appears allergic in nature.  Will treat with triamcinolone cream.  Patient to follow-up with PCP and/or urgent care to ensure adequate healing.  Discussed return precautions.  Patient verbalized understanding and was agreeable with plan. ?Final Clinical Impressions(s) / UC Diagnoses  ? ?Final diagnoses:  ?Rash and nonspecific skin eruption  ?Urticaria  ? ? ? ?Discharge Instructions   ? ?  ?You have been prescribed a cream to help alleviate itching.  Please follow-up if symptoms persist or worsen. ? ? ? ?ED Prescriptions   ? ? Medication Sig Dispense Auth. Provider  ? triamcinolone cream (KENALOG) 0.1 % Apply 1 application. topically 2 (two) times daily. 30 g Teodora Medici, [place]  ? ?  ? ?PDMP not reviewed this encounter. ?  ?Teodora Medici, [place] ?11/29/21 1028 ? ?

## 2021-12-05 ENCOUNTER — Ambulatory Visit
Admission: EM | Admit: 2021-12-05 | Discharge: 2021-12-05 | Disposition: A | Discharge status: Home / Self Care | Payer: Medicare Other | Attending: Urgent Care | Admitting: Urgent Care

## 2021-12-05 DIAGNOSIS — L259 Unspecified contact dermatitis, unspecified cause: Secondary | ICD-10-CM

## 2021-12-05 DIAGNOSIS — L299 Pruritus, unspecified: Secondary | ICD-10-CM | POA: Diagnosis not present

## 2021-12-05 MED ORDER — BETAMETHASONE DIPROPIONATE 0.05 % EX OINT: TOPICAL_OINTMENT | Freq: Two times a day (BID) | CUTANEOUS | 0 refills | Status: DC

## 2021-12-05 NOTE — Discharge Instructions (Addendum)
Switch to the betamethasone ointment instead of the triamcinolone cream. Apply thin layers twice daily for 1 week, then stop.  ?

## 2021-12-05 NOTE — ED Triage Notes (Signed)
Patient presents to Urgent Care with complaints of rash on L inner thigh since 2 weeks ago. Patient reports triamcinolone cream helped the itch but not helped the rash.  ? ?

## 2021-12-05 NOTE — ED Provider Notes (Signed)
?[facility] ? ? ?MRN: [id number] DOB: 09-13-1940 ? ?Subjective:  ? ?Jerome Kelley is a 81 y.o. male presenting for 2-week history of persistent rash over the left inner thigh.  Patient reports that is primarily itchy.  No tenderness.  No fevers, warmth, headaches, drainage of pus or bleeding.  No sick symptoms. ? ?No current facility-administered medications for this encounter. ? ?Current Outpatient Medications:  ?  aspirin 81 MG chewable tablet, Chew 1 tablet (81 mg total) by mouth 2 (two) times daily. (Patient taking differently: Chew 81 mg by mouth daily.), Disp: 35 tablet, Rfl: 0 ?  benazepril (LOTENSIN) 20 MG tablet, Take 20 mg by mouth daily., Disp: , Rfl:  ?  lidocaine-prilocaine (EMLA) cream, Apply 1 application. topically as needed. Apply 1 hour prior to procedure., Disp: 30 g, Rfl: 0 ?  LORazepam (ATIVAN) 0.5 MG tablet, TAKE 1 TABLET BY MOUTH EVERY 8 HOURS AS NEEDED FOR ANXIETY, Disp: 60 tablet, Rfl: 0 ?  triamcinolone cream (KENALOG) 0.1 %, Apply 1 application. topically 2 (two) times daily., Disp: 30 g, Rfl: 0 ?  vitamin B-12 (CYANOCOBALAMIN) 1000 MCG tablet, Take 1 tablet (1,000 mcg total) by mouth daily., Disp: 30 tablet, Rfl: 0  ? ?No Known Allergies ? ?Past Medical History:  ?Diagnosis Date  ? Colon cancer ([place]) 09/18/2011  ? Goals of care, counseling/discussion 03/15/2020  ? History of chemotherapy   ? History of radiation therapy 11/04/2020-12/15/2020  ?  mediastinal and right hilar region    Dr Gery Pray  ? Hypertension   ? Lung cancer, upper lobe ([place]) 09/18/2011  ? Malignant neoplasm of upper lobe of right lung ([place]) 04/30/2020  ?  ? ?Past Surgical History:  ?Procedure Laterality Date  ? BRONCHIAL WASHINGS  03/19/2020  ? Procedure: BRONCHIAL WASHINGS;  Surgeon: Juanito Doom, MD;  Location: [facility];  Service: Cardiopulmonary;;  ? COLON SURGERY  2010  ? ENDOBRONCHIAL ULTRASOUND N/A 03/19/2020  ? Procedure: ENDOBRONCHIAL ULTRASOUND;  Surgeon: Juanito Doom, MD;  Location: [facility];  Service: Cardiopulmonary;  Laterality: N/A;  ? FINE NEEDLE ASPIRATION  03/19/2020  ? Procedure: FINE NEEDLE ASPIRATION;  Surgeon: Juanito Doom, MD;  Location: [facility];  Service: Cardiopulmonary;;  ? FINE NEEDLE ASPIRATION BIOPSY  04/02/2020  ? Procedure: FINE NEEDLE ASPIRATION BIOPSY;  Surgeon: Laurin Coder, MD;  Location: [facility];  Service: Pulmonary;;  ? IR IMAGING GUIDED PORT INSERTION  05/07/2020  ? LUNG SURGERY    ? right side   ? port a cath insertion    ? port a cath removed    ? TOTAL HIP ARTHROPLASTY Right 10/18/2018  ? Procedure: TOTAL HIP ARTHROPLASTY ANTERIOR APPROACH;  Surgeon: Mcarthur Rossetti, MD;  Location: [facility];  Service: Orthopedics;  Laterality: Right;  ? VIDEO BRONCHOSCOPY N/A 03/19/2020  ? Procedure: VIDEO BRONCHOSCOPY WITHOUT FLUORO;  Surgeon: Juanito Doom, MD;  Location: [facility];  Service: Cardiopulmonary;  Laterality: N/A;  ? VIDEO BRONCHOSCOPY N/A 04/02/2020  ? Procedure: VIDEO BRONCHOSCOPY WITH EBUS;  Surgeon: Laurin Coder, MD;  Location: [facility];  Service: Pulmonary;  Laterality: N/A;  ? ? ?History reviewed. No pertinent family history. ? ?Social History  ? ?Tobacco Use  ? Smoking status: Every Day  ?  Packs/day: 1.00  ?  Years: 58.00  ?  Pack years: 58.00  ?  Types: Cigarettes  ?  Start date: 09/03/1956  ? Smokeless tobacco: Never  ? Tobacco comments:  ?  does not want to quit  ?Vaping  Use  ? Vaping Use: Never used  ?Substance Use Topics  ? Alcohol use: Yes  ?  Alcohol/week: 3.0 standard drinks  ?  Types: 3 Cans of beer per week  ?  Comment: Fridays only  ? Drug use: Never  ? ? ?ROS ? ? ?Objective:  ? ?Vitals: ?BP 131/78 (BP Location: Right Arm)   Pulse 75   Temp 97.9 ?F (36.6 ?C) (Tympanic)   Resp 18   SpO2 98%  ? ?Physical Exam ?Constitutional:   ?   General: He is not in acute distress. ?   Appearance: Normal appearance. He is well-developed and normal weight. He is not ill-appearing, toxic-appearing or diaphoretic.  ?HENT:  ?    Head: Normocephalic and atraumatic.  ?   Right Ear: External ear normal.  ?   Left Ear: External ear normal.  ?   Nose: Nose normal.  ?   Mouth/Throat:  ?   Pharynx: Oropharynx is clear.  ?Eyes:  ?   General: No scleral icterus.    ?   Right eye: No discharge.     ?   Left eye: No discharge.  ?   Extraocular Movements: Extraocular movements intact.  ?Cardiovascular:  ?   Rate and Rhythm: Normal rate.  ?Pulmonary:  ?   Effort: Pulmonary effort is normal.  ?Musculoskeletal:  ?   Cervical back: Normal range of motion.  ?Skin: ? ?    ?Neurological:  ?   Mental Status: He is alert and oriented to person, place, and time.  ?Psychiatric:     ?   Mood and Affect: Mood normal.     ?   Behavior: Behavior normal.     ?   Thought Content: Thought content normal.     ?   Judgment: Judgment normal.  ? ? ? ? ? ?Assessment and Plan :  ? ?PDMP not reviewed this encounter. ? ?1. Contact dermatitis, unspecified contact dermatitis type, unspecified trigger   ?2. Itching   ? ?Low suspicion for infectious process.  Suspect a contact dermatitis to an unknown offending agent.  Recommended using betamethasone ointment instead of the triamcinolone cream.  Use Zyrtec at 5 mg for the itching.  Our printer was not working and therefore sent patient home without his visit summary but I explained in detail the instructions and treatment plan.  Counseled patient on potential for adverse effects with medications prescribed/recommended today, ER and return-to-clinic precautions discussed, patient verbalized understanding. ? ?  ?Jaynee Eagles, PA-C ?12/05/21 1128 ? ?

## 2021-12-12 ENCOUNTER — Encounter: Payer: Self-pay | Admitting: Hematology & Oncology

## 2021-12-14 ENCOUNTER — Ambulatory Visit: Payer: Medicare Other | Admitting: Hematology & Oncology

## 2021-12-14 ENCOUNTER — Other Ambulatory Visit: Payer: Medicare Other

## 2021-12-14 ENCOUNTER — Ambulatory Visit: Payer: Medicare Other

## 2021-12-14 ENCOUNTER — Ambulatory Visit (HOSPITAL_COMMUNITY)
Admission: RE | Admit: 2021-12-14 | Discharge: 2021-12-14 | Disposition: A | Discharge status: Home / Self Care | Payer: Medicare Other | Source: Ambulatory Visit | Attending: Hematology & Oncology | Admitting: Hematology & Oncology

## 2021-12-14 DIAGNOSIS — J439 Emphysema, unspecified: Secondary | ICD-10-CM | POA: Diagnosis not present

## 2021-12-14 DIAGNOSIS — I2510 Atherosclerotic heart disease of native coronary artery without angina pectoris: Secondary | ICD-10-CM | POA: Diagnosis not present

## 2021-12-14 DIAGNOSIS — C3411 Malignant neoplasm of upper lobe, right bronchus or lung: Secondary | ICD-10-CM | POA: Insufficient documentation

## 2021-12-14 DIAGNOSIS — I898 Other specified noninfective disorders of lymphatic vessels and lymph nodes: Secondary | ICD-10-CM | POA: Diagnosis not present

## 2021-12-14 DIAGNOSIS — C3490 Malignant neoplasm of unspecified part of unspecified bronchus or lung: Secondary | ICD-10-CM | POA: Diagnosis not present

## 2021-12-14 LAB — GLUCOSE, CAPILLARY: Glucose-Capillary: 98 mg/dL (ref 70–99)

## 2021-12-14 IMAGING — CT NM PET TUM IMG RESTAG (PS) SKULL BASE T - THIGH
1 of 7 series · 1 of 25 positions shown · non-contrast
Comparison: [DATE]

CLINICAL DATA: Subsequent treatment strategy for non-small cell
lung cancer.

EXAM:
NUCLEAR MEDICINE PET SKULL BASE TO THIGH
TECHNIQUE: 7.68 mCi F-18 FDG was injected intravenously. Full-ring PET imaging
was performed from the skull base to thigh after the radiotracer. CT
data was obtained and used for attenuation correction and anatomic
localization.
Fasting blood glucose: 98 mg/dl

[Series 4: ct sk_thigh 5.0 br38 · axial · 5.0mm · 0.98mm/px · 1 of 243 slices shown]
[im 243/243  brain]
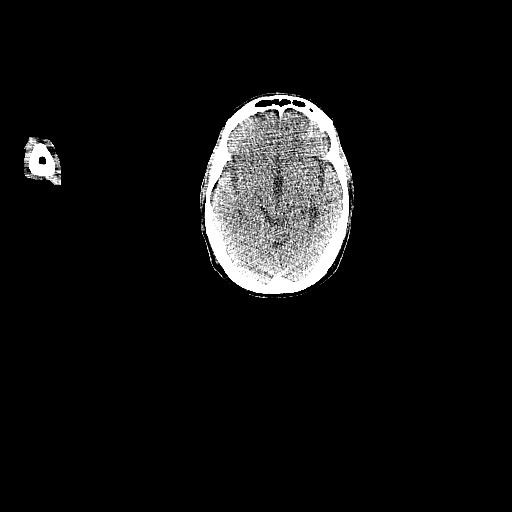

[1 of 25 positions shown; findings below may reference images not displayed]

FINDINGS: Mediastinal blood pool activity: SUV max

Liver activity: SUV max NA

NECK: No hypermetabolic lymph nodes in the neck.

Incidental CT findings: none

CHEST: Again noted are tracer avid calcified mediastinal and hilar
lymph nodes. Index right lower paratracheal lymph node measures 1 cm
with FDG uptake of 5.05, image 70/4. Previously this measured 1 cm
with an SUV max 5.17. The index pre-vascular lymph node measures
mm with FDG uptake of 4.0, image 67/4.

Tracer avid right middle lobe lung nodule measures 7 mm and has an
SUV max of 5.66, image 38/7. Previously this measured 6 mm with SUV
max of 2.3.

Right lower lobe pulmonary nodule measures 5 mm. Technically too
small to reliably characterize this has an SUV max of 1.38, image
41/7. Unchanged from previous exam.

Paramediastinal masslike architectural distortion in fibrosis within
the right lung is again noted compatible with changes of external
beam radiation. Posterior pleural thickening is identified overlying
the posterior and medial right midlung which also appears unchanged
from previous study. Subpleural scarring within both lower lobes
identified posteriorly. Unchanged.

Incidental CT findings: Moderate emphysema. Aortic atherosclerosis
and coronary artery calcifications. No pericardial effusion.

ABDOMEN/PELVIS: No abnormal tracer uptake within the liver,
pancreas, or spleen. The spleen is enlarged measuring 13 cm cranial
caudal, unchanged. No focal areas of increased uptake within the
spleen

Previously reported FDG avid, progressive retroperitoneal lymph
nodes include:

-left periaortic lymph node measures 7 mm with SUV max of 1.84,
image 124/4. Formally this measured 0.9 cm with SUV max of 6.4.

-The index right periaortic lymph node measures 6 mm with SUV max of
1.98, image 128/4. Previously this measured 9 mm with SUV max of
5.5.

No new tracer avid abdominopelvic lymph nodes.

Incidental CT findings: Aortic atherosclerosis. Postoperative
changes from right hemicolectomy with enterocolonic anastomosis
identified. Extensive sigmoid diverticulosis without acute
inflammation.

SKELETON: No signs of FDG avid bone metastases. There is a focal
area of increased uptake within the thoracic spine which is centered
around the disc space at T9/T10 and likely reflects inflammation
secondary to degenerative change. Additionally increased uptake
localizes to a new superior endplate fracture deformity involving
the L1 vertebral body. Likely posttraumatic activity.

Incidental CT findings: Right hip arthroplasty.
IMPRESSION: 1. Interval increased tracer uptake associated with the right middle
lobe lung nodule. This currently measures 7 mm with SUV max of 5.66.
Formally this measured 6 mm with SUV max of 2.3. This is suspicious
for malignancy.
2. Interval regression of previous tracer avid retroperitoneal lymph
nodes with resolution of associated increased radiotracer uptake on
today's study.
3. Persistent FDG uptake associated with mediastinal and hilar lymph
nodes. These nodes scratch set as noted previously lymph nodes are
calcified and noncalcified. Tracer uptake is favored to represent
inflammatory changes associated with granulomatous disease.
4. Splenomegaly. No significant focal areas of increased uptake and
no diffuse increased uptake above liver activity identified on
today's study.
5. Aortic Atherosclerosis ([MP]-[MP]) and Emphysema ([MP]-[MP]).

## 2021-12-14 MED ORDER — FLUDEOXYGLUCOSE F - 18 (FDG) INJECTION
7.6800 | Freq: Once | INTRAVENOUS | Status: AC
Start: 2021-12-14 — End: 2021-12-14
  Administered 2021-12-14: 7.68 via INTRAVENOUS

## 2021-12-15 ENCOUNTER — Telehealth: Payer: Self-pay

## 2021-12-15 NOTE — Telephone Encounter (Addendum)
Message left to contact our office for PET results. dph ? ?----- Message from [name], MD sent at 12/15/2021 12:43 PM EDT ----- ?Call - the cancer is holding stable!!  Jerome Kelley ?

## 2021-12-16 ENCOUNTER — Inpatient Hospital Stay: Payer: Medicare Other | Attending: Hematology & Oncology

## 2021-12-16 ENCOUNTER — Inpatient Hospital Stay: Payer: Medicare Other

## 2021-12-16 ENCOUNTER — Encounter: Payer: Self-pay | Admitting: Hematology & Oncology

## 2021-12-16 ENCOUNTER — Inpatient Hospital Stay (HOSPITAL_BASED_OUTPATIENT_CLINIC_OR_DEPARTMENT_OTHER): Payer: Medicare Other | Admitting: Hematology & Oncology

## 2021-12-16 VITALS — BP 108/62 | HR 68 | Temp 97.8°F | Resp 20 | Ht 64.0 in | Wt 154.0 lb

## 2021-12-16 DIAGNOSIS — Z5112 Encounter for antineoplastic immunotherapy: Secondary | ICD-10-CM | POA: Insufficient documentation

## 2021-12-16 DIAGNOSIS — C3411 Malignant neoplasm of upper lobe, right bronchus or lung: Secondary | ICD-10-CM | POA: Diagnosis not present

## 2021-12-16 DIAGNOSIS — C342 Malignant neoplasm of middle lobe, bronchus or lung: Secondary | ICD-10-CM | POA: Diagnosis not present

## 2021-12-16 DIAGNOSIS — C772 Secondary and unspecified malignant neoplasm of intra-abdominal lymph nodes: Secondary | ICD-10-CM | POA: Diagnosis not present

## 2021-12-16 DIAGNOSIS — Z79899 Other long term (current) drug therapy: Secondary | ICD-10-CM | POA: Diagnosis not present

## 2021-12-16 LAB — CMP (CANCER CENTER ONLY)
ALT: 8 U/L (ref 0–44)
AST: 11 U/L — ABNORMAL LOW (ref 15–41)
Albumin: 3.5 g/dL (ref 3.5–5.0)
Alkaline Phosphatase: 128 U/L — ABNORMAL HIGH (ref 38–126)
Anion gap: 6 (ref 5–15)
BUN: 12 mg/dL (ref 8–23)
CO2: 27 mmol/L (ref 22–32)
Calcium: 8.8 mg/dL — ABNORMAL LOW (ref 8.9–10.3)
Chloride: 106 mmol/L (ref 98–111)
Creatinine: 0.75 mg/dL (ref 0.61–1.24)
GFR, Estimated: >60 mL/min (ref >60)
Glucose, Bld: 146 mg/dL — ABNORMAL HIGH (ref 70–99)
Potassium: 3.5 mmol/L (ref 3.5–5.1)
Sodium: 139 mmol/L (ref 135–145)
Total Bilirubin: 0.9 mg/dL (ref 0.3–1.2)
Total Protein: 6.1 g/dL — ABNORMAL LOW (ref 6.5–8.1)

## 2021-12-16 LAB — CBC WITH DIFFERENTIAL (CANCER CENTER ONLY)
Abs Immature Granulocytes: 0.01 10*3/uL (ref 0.00–0.07)
Basophils Absolute: 0 10*3/uL (ref 0.0–0.1)
Basophils Relative: 1 %
Eosinophils Absolute: 0.2 10*3/uL (ref 0.0–0.5)
Eosinophils Relative: 5 %
HCT: 33.3 % — ABNORMAL LOW (ref 39.0–52.0)
Hemoglobin: 11.3 g/dL — ABNORMAL LOW (ref 13.0–17.0)
Immature Granulocytes: 0 %
Lymphocytes Relative: 20 %
Lymphs Abs: 0.8 10*3/uL (ref 0.7–4.0)
MCH: 33.2 pg (ref 26.0–34.0)
MCHC: 33.9 g/dL (ref 30.0–36.0)
MCV: 97.9 fL (ref 80.0–100.0)
Monocytes Absolute: 0.4 10*3/uL (ref 0.1–1.0)
Monocytes Relative: 10 %
Neutro Abs: 2.7 10*3/uL (ref 1.7–7.7)
Neutrophils Relative %: 64 %
Platelet Count: 91 10*3/uL — ABNORMAL LOW (ref 150–400)
RBC: 3.4 MIL/uL — ABNORMAL LOW (ref 4.22–5.81)
RDW: 15.5 % (ref 11.5–15.5)
WBC Count: 4.1 10*3/uL (ref 4.0–10.5)
nRBC: 0 % (ref 0.0–0.2)

## 2021-12-16 LAB — TSH: TSH: 4.182 u[IU]/mL (ref 0.350–4.500)

## 2021-12-16 LAB — LACTATE DEHYDROGENASE: LDH: 149 U/L (ref 98–192)

## 2021-12-16 MED ORDER — DIPHENHYDRAMINE HCL 50 MG/ML IJ SOLN
25.0000 mg | Freq: Once | INTRAMUSCULAR | Status: AC
  Administered 2021-12-16: 25 mg via INTRAVENOUS
  Filled 2021-12-16: qty 1

## 2021-12-16 MED ORDER — MOMETASONE FUROATE 0.1 % EX OINT: TOPICAL_OINTMENT | Freq: Every day | CUTANEOUS | 0 refills | Status: DC

## 2021-12-16 MED ORDER — SODIUM CHLORIDE 0.9 % IV SOLN
2.8100 mg/kg | Freq: Once | INTRAVENOUS | Status: AC
  Administered 2021-12-16: 200 mg via INTRAVENOUS
  Filled 2021-12-16: qty 20

## 2021-12-16 MED ORDER — HEPARIN SOD (PORK) LOCK FLUSH 100 UNIT/ML IV SOLN
500.0000 [IU] | Freq: Once | INTRAVENOUS | Status: AC | PRN
  Administered 2021-12-16: 500 [IU]

## 2021-12-16 MED ORDER — SODIUM CHLORIDE 0.9 % IV SOLN: Freq: Once | INTRAVENOUS | Status: AC

## 2021-12-16 MED ORDER — FAMOTIDINE IN NACL 20-0.9 MG/50ML-% IV SOLN
20.0000 mg | Freq: Once | INTRAVENOUS | Status: AC
  Administered 2021-12-16: 20 mg via INTRAVENOUS
  Filled 2021-12-16: qty 50

## 2021-12-16 MED ORDER — SODIUM CHLORIDE 0.9% FLUSH
10.0000 mL | INTRAVENOUS | Status: DC | PRN
  Administered 2021-12-16: 10 mL

## 2021-12-16 NOTE — Progress Notes (Signed)
?Hematology and Oncology Follow Up Visit ? ?Rush Farmer ?[id number] ?[birth year] 81 y.o. ?12/16/2021 ? ? 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  ?Current Therapy:        ?Carboplatin/Alimta/Pembrolizumab - started 05/10/2020- s/p cycle 6 ?Radiation with weekly carboplatin/Taxol-start on 11/01/2020 -- completed on 12/20/2020 ?Yervoy/Opdivo --s/p cycle #4  -- start on 09/15/2021 ? ?  ?Interim History:  Mr. Holway is here today for follow-up.  Everything seems to be going pretty well for him.  Unfortunately, he still is smoking. ? ?He has had a little bit of a rash.  This may be from the [place].  We will hold the Yervoy at this point. ? ?He did have a PET scan done.  This was done on 12/14/2021.  The PET scan showed that he had a response.  He had decrease in the retroperitoneal lymph nodes with resolution of activity on some of them.  He had persistent activity and somewhat mediastinal lymph nodes.  There was a 7 mm right middle lung nodule with a elevated SUV. ? ?He has had no cough.  There is no shortness of breath.  He has had no nausea or vomiting.  He has had no diarrhea. ? ?His appetite has been good.  He has had no nausea or vomiting. ? ?Overall, I would say his performance status is probably ECOG 1.   ? ?Medications:  ?Allergies as of 12/16/2021   ?No Known Allergies ?  ? ?  ?Medication List  ?  ? ?  ? Accurate as of Dec 16, 2021 11:09 AM. If you have any questions, ask your nurse or doctor.  ?  ?  ? ?  ? ?aspirin 81 MG chewable tablet ?Chew 1 tablet (81 mg total) by mouth 2 (two) times daily. ?What changed: when to take this ?  ?benazepril 20 MG tablet ?Commonly known as: LOTENSIN ?Take 20 mg by mouth  daily. ?  ?betamethasone dipropionate 0.05 % ointment ?Commonly known as: DIPROLENE ?Apply topically 2 (two) times daily. ?  ?lidocaine-prilocaine cream ?Commonly known as: EMLA ?Apply 1 application. topically as needed. Apply 1 hour prior to procedure. ?  ?LORazepam 0.5 MG tablet ?Commonly known as: ATIVAN ?TAKE 1 TABLET BY MOUTH EVERY 8 HOURS AS NEEDED FOR ANXIETY ?  ?triamcinolone cream 0.1 % ?Commonly known as: KENALOG ?Apply 1 application. topically 2 (two) times daily. ?  ?vitamin B-12 1000 MCG tablet ?Commonly known as: CYANOCOBALAMIN ?Take 1 tablet (1,000 mcg total) by mouth daily. ?  ? ?  ? ? ?Allergies: No Known Allergies ? ?Past Medical History, Surgical history, Social history, and Family History were reviewed and updated. ? ?Review of Systems: ?Review of Systems  ?Constitutional: Negative.   ?HENT: Negative.    ?Eyes: Negative.   ?Respiratory: Negative.    ?Cardiovascular: Negative.   ?Gastrointestinal: Negative.   ?Genitourinary: Negative.   ?Musculoskeletal: Negative.   ?Skin: Negative.   ?Neurological:  Positive for dizziness.  ?Endo/Heme/Allergies: Negative.   ?Psychiatric/Behavioral: Negative.    ? ? ?Physical Exam: ? vitals were not taken for this visit.  ? ?Wt Readings from Last 3 Encounters:  ?11/18/21 154 lb (69.9 kg)  ?10/27/21 153 lb (69.4 kg)  ?10/06/21 153 lb 0.6 oz (69.4 kg)  ? ? ?  Physical Exam ?Vitals reviewed.  ?HENT:  ?   Head: Normocephalic and atraumatic.  ?Eyes:  ?   Pupils: Pupils are equal, round, and reactive to light.  ?Cardiovascular:  ?   Rate and Rhythm: Normal rate and regular rhythm.  ?   Heart sounds: Normal heart sounds.  ?Pulmonary:  ?   Effort: Pulmonary effort is normal.  ?   Breath sounds: Normal breath sounds.  ?Abdominal:  ?   General: Bowel sounds are normal.  ?   Palpations: Abdomen is soft.  ?Musculoskeletal:     ?   General: No tenderness or deformity. Normal range of motion.  ?   Cervical back: Normal range of motion.  ?Lymphadenopathy:  ?   Cervical: No  cervical adenopathy.  ?Skin: ?   General: Skin is warm and dry.  ?   Findings: No erythema or rash.  ?Neurological:  ?   Mental Status: He is alert and oriented to person, place, and time.  ?Psychiatric:     ?   Behavior: Behavior normal.     ?   Thought Content: Thought content normal.     ?   Judgment: Judgment normal.  ? ? ?Lab Results  ?Component Value Date  ? WBC 4.1 12/16/2021  ? HGB 11.3 (L) 12/16/2021  ? HCT 33.3 (L) 12/16/2021  ? MCV 97.9 12/16/2021  ? PLT 91 (L) 12/16/2021  ? ?Lab Results  ?Component Value Date  ? FERRITIN 213 09/07/2021  ? IRON 47 10/27/2021  ? TIBC 244 (L) 10/27/2021  ? UIBC 197 10/27/2021  ? IRONPCTSAT 19 10/27/2021  ? ?Lab Results  ?Component Value Date  ? RETICCTPCT 3.6 (H) 09/07/2021  ? RBC 3.40 (L) 12/16/2021  ? ?No results found for: KPAFRELGTCHN, LAMBDASER, KAPLAMBRATIO ?No results found for: IGGSERUM, IGA, IGMSERUM ?No results found for: TOTALPROTELP, ALBUMINELP, A1GS, A2GS, BETS, BETA2SER, GAMS, MSPIKE, SPEI ?  Chemistry   ?   ?Component Value Date/Time  ? NA 139 12/16/2021 0943  ? NA 140 06/28/2011 0950  ? K 3.5 12/16/2021 0943  ? K 4.7 06/28/2011 0950  ? CL 106 12/16/2021 0943  ? CL 100 06/28/2011 0950  ? CO2 27 12/16/2021 0943  ? CO2 29 06/28/2011 0950  ? BUN 12 12/16/2021 0943  ? BUN 10 06/28/2011 0950  ? CREATININE 0.75 12/16/2021 0943  ? CREATININE 0.7 06/28/2011 0950  ?    ?Component Value Date/Time  ? CALCIUM 8.8 (L) 12/16/2021 0943  ? CALCIUM 9.0 06/28/2011 0950  ? ALKPHOS 128 (H) 12/16/2021 6812  ? ALKPHOS 110 (H) 12/22/2010 0850  ? AST 11 (L) 12/16/2021 0943  ? ALT 8 12/16/2021 0943  ? ALT 18 12/22/2010 0850  ? BILITOT 0.9 12/16/2021 0943  ?  ? ? ? ?Impression and Plan: Mr. Diodato is a very pleasant 81 yo caucasian gentleman with previous history of right lung cancer as well as colon cancer 10 years ago. He now has stage IIIb adenocarcinoma of the lung, right middle lobe.  ? ?We initially treated him with systemic chemotherapy.  He was not a candidate for surgery.  He  was not candidate for curative chemoradiation therapy together. ? ?After systemic chemotherapy, we then relied on radiation and low-dose carboplatinum/Taxol.  ? ?He subsequently had another relapse.  I am sure his smoking is not helping.  We now have him on immunotherapy. ? ?At this point, I think that the PET scan looks relatively good.  Again he has at 1 right middle lobe nodule that has some  increased activity.  We will have to watch this closely. ? ?We will just have him on nivolumab now.  I do not know if this rash that he had was from the [place].  I certainly do not see a rash home now. ? ?Again, I think as long as he is smoking, I think can be tough to totally get him into remission. ? ?For right now, we will plan to get him back to see [place] in 3 weeks.   ? ? ?[name], MD ?5/5/202311:09 AM ? ?

## 2021-12-16 NOTE — Patient Instructions (Signed)
[facility]  Discharge Instructions: ?Thank you for choosing [facility] to provide your oncology and hematology care.  ? ?If you have a lab appointment with the [facility], please go directly to the [facility] and check in at the registration area. ? ?Wear comfortable clothing and clothing appropriate for easy access to any Portacath or PICC line.  ? ?We strive to give you quality time with your provider. You may need to reschedule your appointment if you arrive late (15 or more minutes).  Arriving late affects you and other patients whose appointments are after yours.  Also, if you miss three or more appointments without notifying the office, you may be dismissed from the clinic at the provider?s discretion.    ?  ?For prescription refill requests, have your pharmacy contact our office and allow 72 hours for refills to be completed.   ? ?Today you received the following chemotherapy and/or immunotherapy agents Opdivo ?  ?To help prevent nausea and vomiting after your treatment, we encourage you to take your nausea medication as directed. ? ?BELOW ARE SYMPTOMS THAT SHOULD BE REPORTED IMMEDIATELY: ?*FEVER GREATER THAN 100.4 F (38 ?C) OR HIGHER ?*CHILLS OR SWEATING ?*NAUSEA AND VOMITING THAT IS NOT CONTROLLED WITH YOUR NAUSEA MEDICATION ?*UNUSUAL SHORTNESS OF BREATH ?*UNUSUAL BRUISING OR BLEEDING ?*URINARY PROBLEMS (pain or burning when urinating, or frequent urination) ?*BOWEL PROBLEMS (unusual diarrhea, constipation, pain near the anus) ?TENDERNESS IN MOUTH AND THROAT WITH OR WITHOUT PRESENCE OF ULCERS (sore throat, sores in mouth, or a toothache) ?UNUSUAL RASH, SWELLING OR PAIN  ?UNUSUAL VAGINAL DISCHARGE OR ITCHING  ? ?Items with * indicate a potential emergency and should be followed up as soon as possible or go to the Emergency Department if any problems should occur. ? ?Please show the CHEMOTHERAPY ALERT CARD or IMMUNOTHERAPY ALERT CARD at check-in to the  Emergency Department and triage nurse. ?Should you have questions after your visit or need to cancel or reschedule your appointment, please contact [facility]  [phone] and follow the prompts.  Office hours are 8:00 a.m. to 4:30 p.m. Monday - Friday. Please note that voicemails left after 4:00 p.m. may not be returned until the following business day.  We are closed weekends and major holidays. You have access to a nurse at all times for urgent questions. Please call the main number to the clinic [phone] and follow the prompts. ? ?For any non-urgent questions, you may also contact your provider using MyChart. We now offer e-Visits for anyone 57 and older to request care online for non-urgent symptoms. For details visit [email or web address]. ?  ?Also download the MyChart app! Go to the app store, search "MyChart", open the app, select [facility], and log in with your MyChart username and password. ? ?Due to Covid, a mask is required upon entering the hospital/clinic. If you do not have a mask, one will be given to you upon arrival. For doctor visits, patients may have 1 support person aged 43 or older with them. For treatment visits, patients cannot have anyone with them due to current Covid guidelines and our immunocompromised population.  ?

## 2021-12-16 NOTE — Addendum Note (Signed)
Addended by: Burney Gauze R on: 12/16/2021 05:23 PM ? ? Modules accepted: Orders ? ?

## 2021-12-16 NOTE — Progress Notes (Signed)
OK to treat with today's platelets value per Dr. Ennever. 

## 2021-12-16 NOTE — Patient Instructions (Signed)

## 2021-12-16 NOTE — Addendum Note (Signed)
Addended by: Burney Gauze R on: 12/16/2021 11:38 AM ? ? Modules accepted: Orders ? ?

## 2021-12-17 LAB — T4: T4, Total: 6 ug/dL (ref 4.5–12.0)

## 2022-01-16 DIAGNOSIS — H18513 Endothelial corneal dystrophy, bilateral: Secondary | ICD-10-CM | POA: Diagnosis not present

## 2022-01-16 DIAGNOSIS — H3500 Unspecified background retinopathy: Secondary | ICD-10-CM | POA: Diagnosis not present

## 2022-01-16 DIAGNOSIS — H52203 Unspecified astigmatism, bilateral: Secondary | ICD-10-CM | POA: Diagnosis not present

## 2022-01-17 ENCOUNTER — Inpatient Hospital Stay: Payer: Medicare Other | Attending: Hematology & Oncology

## 2022-01-17 ENCOUNTER — Encounter: Payer: Self-pay | Admitting: Hematology & Oncology

## 2022-01-17 ENCOUNTER — Telehealth: Payer: Self-pay | Admitting: *Deleted

## 2022-01-17 ENCOUNTER — Inpatient Hospital Stay: Payer: Medicare Other

## 2022-01-17 ENCOUNTER — Inpatient Hospital Stay (HOSPITAL_BASED_OUTPATIENT_CLINIC_OR_DEPARTMENT_OTHER): Payer: Medicare Other | Admitting: Hematology & Oncology

## 2022-01-17 VITALS — BP 131/57 | HR 72 | Temp 97.6°F | Resp 18 | Ht 68.0 in | Wt 155.1 lb

## 2022-01-17 DIAGNOSIS — C3411 Malignant neoplasm of upper lobe, right bronchus or lung: Secondary | ICD-10-CM | POA: Diagnosis not present

## 2022-01-17 DIAGNOSIS — Z79899 Other long term (current) drug therapy: Secondary | ICD-10-CM | POA: Diagnosis not present

## 2022-01-17 DIAGNOSIS — Z5112 Encounter for antineoplastic immunotherapy: Secondary | ICD-10-CM | POA: Insufficient documentation

## 2022-01-17 DIAGNOSIS — C342 Malignant neoplasm of middle lobe, bronchus or lung: Secondary | ICD-10-CM | POA: Insufficient documentation

## 2022-01-17 DIAGNOSIS — C772 Secondary and unspecified malignant neoplasm of intra-abdominal lymph nodes: Secondary | ICD-10-CM | POA: Insufficient documentation

## 2022-01-17 LAB — CMP (CANCER CENTER ONLY)
ALT: 6 U/L (ref 0–44)
AST: 9 U/L — ABNORMAL LOW (ref 15–41)
Albumin: 3.4 g/dL — ABNORMAL LOW (ref 3.5–5.0)
Alkaline Phosphatase: 118 U/L (ref 38–126)
Anion gap: 5 (ref 5–15)
BUN: 12 mg/dL (ref 8–23)
CO2: 28 mmol/L (ref 22–32)
Calcium: 8.5 mg/dL — ABNORMAL LOW (ref 8.9–10.3)
Chloride: 108 mmol/L (ref 98–111)
Creatinine: 0.78 mg/dL (ref 0.61–1.24)
GFR, Estimated: >60 mL/min (ref >60)
Glucose, Bld: 121 mg/dL — ABNORMAL HIGH (ref 70–99)
Potassium: 3.6 mmol/L (ref 3.5–5.1)
Sodium: 141 mmol/L (ref 135–145)
Total Bilirubin: 0.7 mg/dL (ref 0.3–1.2)
Total Protein: 5.8 g/dL — ABNORMAL LOW (ref 6.5–8.1)

## 2022-01-17 LAB — CBC WITH DIFFERENTIAL (CANCER CENTER ONLY)
Abs Immature Granulocytes: 0.01 10*3/uL (ref 0.00–0.07)
Basophils Absolute: 0 10*3/uL (ref 0.0–0.1)
Basophils Relative: 1 %
Eosinophils Absolute: 0.2 10*3/uL (ref 0.0–0.5)
Eosinophils Relative: 4 %
HCT: 33.3 % — ABNORMAL LOW (ref 39.0–52.0)
Hemoglobin: 11.1 g/dL — ABNORMAL LOW (ref 13.0–17.0)
Immature Granulocytes: 0 %
Lymphocytes Relative: 19 %
Lymphs Abs: 0.8 10*3/uL (ref 0.7–4.0)
MCH: 32.5 pg (ref 26.0–34.0)
MCHC: 33.3 g/dL (ref 30.0–36.0)
MCV: 97.4 fL (ref 80.0–100.0)
Monocytes Absolute: 0.6 10*3/uL (ref 0.1–1.0)
Monocytes Relative: 13 %
Neutro Abs: 2.7 10*3/uL (ref 1.7–7.7)
Neutrophils Relative %: 63 %
Platelet Count: 89 10*3/uL — ABNORMAL LOW (ref 150–400)
RBC: 3.42 MIL/uL — ABNORMAL LOW (ref 4.22–5.81)
RDW: 14.6 % (ref 11.5–15.5)
WBC Count: 4.2 10*3/uL (ref 4.0–10.5)
nRBC: 0 % (ref 0.0–0.2)

## 2022-01-17 LAB — LACTATE DEHYDROGENASE: LDH: 143 U/L (ref 98–192)

## 2022-01-17 MED ORDER — SODIUM CHLORIDE 0.9 % IV SOLN
480.0000 mg | Freq: Once | INTRAVENOUS | Status: AC
  Administered 2022-01-17: 480 mg via INTRAVENOUS
  Filled 2022-01-17: qty 48

## 2022-01-17 MED ORDER — LIDOCAINE-PRILOCAINE 2.5-2.5 % EX CREA: TOPICAL_CREAM | CUTANEOUS | 3 refills | Status: DC

## 2022-01-17 MED ORDER — HEPARIN SOD (PORK) LOCK FLUSH 100 UNIT/ML IV SOLN
500.0000 [IU] | Freq: Once | INTRAVENOUS | Status: AC | PRN
  Administered 2022-01-17: 500 [IU]

## 2022-01-17 MED ORDER — SODIUM CHLORIDE 0.9 % IV SOLN: Freq: Once | INTRAVENOUS | Status: AC

## 2022-01-17 MED ORDER — SODIUM CHLORIDE 0.9% FLUSH
10.0000 mL | INTRAVENOUS | Status: DC | PRN
  Administered 2022-01-17: 10 mL

## 2022-01-17 NOTE — Telephone Encounter (Signed)
Per 01/17/22 los - gave upcoming appointments - confirmed

## 2022-01-17 NOTE — Progress Notes (Signed)
Hematology and Oncology Follow Up Visit  BLAIRE HODSDON [id number] 05-May-1941 81 y.o. 01/17/2022   Principle Diagnosis:  Stage IIIB ([id number]) adenocarcinoma of the right middle lung History of stage Ia (T1aN0M0) adenocarcinoma the right lung - resected in March 2011 History of stage II adenocarcinoma of the colon-high risk - April 2010 Metastatic adenocarcinoma of the lung --retroperitoneal nodal metastasis and new left lung nodule  --high PD-L1   Current Therapy:        Carboplatin/Alimta/Pembrolizumab - started 05/10/2020- s/p cycle 6 Radiation with weekly carboplatin/Taxol-start on 11/01/2020 -- completed on 12/20/2020 Yervoy/Opdivo --s/p cycle #4  -- start on 09/15/2021    Interim History:  Mr. Reim is here today for follow-up.  He looks quite good.  His weight is holding steady.  He is eating pretty well.  He has had no problems with cough or shortness of breath.  He is still smoking.  His rash is pretty much gone now.  I thought this may have been from the [place].  He has had no change in bowel or bladder habits.  He has had no bleeding.  There is been no fever.  He has had no swollen lymph nodes.  Overall, I would say performance status is probably ECOG 1.     Medications:  Allergies as of 01/17/2022   No Known Allergies      Medication List        Accurate as of January 17, 2022 11:21 AM. If you have any questions, ask your nurse or doctor.          aspirin 81 MG chewable tablet Chew 1 tablet (81 mg total) by mouth 2 (two) times daily. What changed: when to take this   benazepril 20 MG tablet Commonly known as: LOTENSIN Take 20 mg by mouth daily.   lidocaine-prilocaine cream Commonly known as: EMLA Apply 1 application. topically as needed. Apply 1 hour prior to procedure.   LORazepam 0.5 MG tablet Commonly known as: ATIVAN TAKE 1 TABLET BY MOUTH EVERY 8 HOURS AS NEEDED FOR ANXIETY   mometasone 0.1 % ointment Commonly known as: ELOCON Apply topically daily.    triamcinolone cream 0.1 % Commonly known as: KENALOG Apply 1 application. topically 2 (two) times daily.   vitamin B-12 1000 MCG tablet Commonly known as: CYANOCOBALAMIN Take 1 tablet (1,000 mcg total) by mouth daily.        Allergies: No Known Allergies  Past Medical History, Surgical history, Social history, and Family History were reviewed and updated.  Review of Systems: Review of Systems  Constitutional: Negative.   HENT: Negative.    Eyes: Negative.   Respiratory: Negative.    Cardiovascular: Negative.   Gastrointestinal: Negative.   Genitourinary: Negative.   Musculoskeletal: Negative.   Skin: Negative.   Neurological:  Positive for dizziness.  Endo/Heme/Allergies: Negative.   Psychiatric/Behavioral: Negative.      Physical Exam:  height is '5\' 8"'  (1.727 m) and weight is 155 lb 1.3 oz (70.3 kg). His oral temperature is 97.6 F (36.4 C). His blood pressure is 131/57 (abnormal) and his pulse is 72. His respiration is 18 and oxygen saturation is 100%.   Wt Readings from Last 3 Encounters:  01/17/22 155 lb 1.3 oz (70.3 kg)  12/16/21 154 lb (69.9 kg)  11/18/21 154 lb (69.9 kg)    Physical Exam Vitals reviewed.  HENT:     Head: Normocephalic and atraumatic.  Eyes:     Pupils: Pupils are equal, round, and reactive to light.  Cardiovascular:     Rate and Rhythm: Normal rate and regular rhythm.     Heart sounds: Normal heart sounds.  Pulmonary:     Effort: Pulmonary effort is normal.     Breath sounds: Normal breath sounds.  Abdominal:     General: Bowel sounds are normal.     Palpations: Abdomen is soft.  Musculoskeletal:        General: No tenderness or deformity. Normal range of motion.     Cervical back: Normal range of motion.  Lymphadenopathy:     Cervical: No cervical adenopathy.  Skin:    General: Skin is warm and dry.     Findings: No erythema or rash.  Neurological:     Mental Status: He is alert and oriented to person, place, and time.   Psychiatric:        Behavior: Behavior normal.        Thought Content: Thought content normal.        Judgment: Judgment normal.    Lab Results  Component Value Date   WBC 4.2 01/17/2022   HGB 11.1 (L) 01/17/2022   HCT 33.3 (L) 01/17/2022   MCV 97.4 01/17/2022   PLT 89 (L) 01/17/2022   Lab Results  Component Value Date   FERRITIN 213 09/07/2021   IRON 47 10/27/2021   TIBC 244 (L) 10/27/2021   UIBC 197 10/27/2021   IRONPCTSAT 19 10/27/2021   Lab Results  Component Value Date   RETICCTPCT 3.6 (H) 09/07/2021   RBC 3.42 (L) 01/17/2022   No results found for: KPAFRELGTCHN, LAMBDASER, KAPLAMBRATIO No results found for: IGGSERUM, IGA, IGMSERUM No results found for: Odetta Pink, SPEI   Chemistry      Component Value Date/Time   NA 141 01/17/2022 1020   NA 140 06/28/2011 0950   K 3.6 01/17/2022 1020   K 4.7 06/28/2011 0950   CL 108 01/17/2022 1020   CL 100 06/28/2011 0950   CO2 28 01/17/2022 1020   CO2 29 06/28/2011 0950   BUN 12 01/17/2022 1020   BUN 10 06/28/2011 0950   CREATININE 0.78 01/17/2022 1020   CREATININE 0.7 06/28/2011 0950      Component Value Date/Time   CALCIUM 8.5 (L) 01/17/2022 1020   CALCIUM 9.0 06/28/2011 0950   ALKPHOS 118 01/17/2022 1020   ALKPHOS 110 (H) 12/22/2010 0850   AST 9 (L) 01/17/2022 1020   ALT 6 01/17/2022 1020   ALT 18 12/22/2010 0850   BILITOT 0.7 01/17/2022 1020       Impression and Plan: Mr. Amirault is a very pleasant 81 yo caucasian gentleman with previous history of right lung cancer as well as colon cancer 10 years ago. He now has stage IIIb adenocarcinoma of the lung, right middle lobe.   We initially treated him with systemic chemotherapy.  He was not a candidate for surgery.  He was not candidate for curative chemoradiation therapy together.  After systemic chemotherapy, we then relied on radiation and low-dose carboplatinum/Taxol.   He subsequently had another  relapse.  I am sure his smoking is not helping.  We now have him on immunotherapy.  We will continue him on the nivolumab.  I think he is doing pretty well with this.  I will plan to give him another 2 cycles then we will rescan him.  With his last PET scan, he had a right middle lobe nodule that appeared to be a little more active.  This is  some we will have to watch.  Again he is still smoking.  He is not going to stop smoking despite my plea to get him to stop.  We will plan for another follow-up in 3 weeks.    [name], MD 6/6/202311:21 AM

## 2022-01-17 NOTE — Progress Notes (Signed)
MD reviewed cbc and cmet ok to treat despite counts

## 2022-01-17 NOTE — Patient Instructions (Signed)

## 2022-01-17 NOTE — Patient Instructions (Signed)
Nivolumab injection What is this medication? NIVOLUMAB (nye VOL ue mab) is a monoclonal antibody. It treats certain types of cancer. Some of the cancers treated are colon cancer, head and neck cancer, Hodgkin lymphoma, lung cancer, and melanoma. This medicine may be used for other purposes; ask your health care provider or pharmacist if you have questions. COMMON BRAND NAME(S): Opdivo What should I tell my care team before I take this medication? They need to know if you have any of these conditions: Autoimmune diseases such as Crohn's disease, ulcerative colitis, or lupus Have had or planning to have an allogeneic stem cell transplant (uses someone else's stem cells) History of chest radiation Organ transplant Nervous system problems such as myasthenia gravis or Guillain-Barre syndrome An unusual or allergic reaction to nivolumab, other medicines, foods, dyes, or preservatives Pregnant or trying to get pregnant Breast-feeding How should I use this medication? This medication is injected into a vein. It is given in a hospital or clinic setting. A special MedGuide will be given to you before each treatment. Be sure to read this information carefully each time. Talk to your care team regarding the use of this medication in children. While it may be prescribed for children as young as 12 years for selected conditions, precautions do apply. Overdosage: If you think you have taken too much of this medicine contact a poison control center or emergency room at once. NOTE: This medicine is only for you. Do not share this medicine with others. What if I miss a dose? Keep appointments for follow-up doses. It is important not to miss your dose. Call your care team if you are unable to keep an appointment. What may interact with this medication? Interactions have not been studied. This list may not describe all possible interactions. Give your health care provider a list of all the medicines, herbs,  non-prescription drugs, or dietary supplements you use. Also tell them if you smoke, drink alcohol, or use illegal drugs. Some items may interact with your medicine. What should I watch for while using this medication? Your condition will be monitored carefully while you are receiving this medication. You may need blood work done while you are taking this medication. Do not become pregnant while taking this medication or for 5 months after stopping it. Women should inform their care team if they wish to become pregnant or think they might be pregnant. There is a potential for serious harm to an unborn child. Talk to your care team for more information. Do not breast-feed an infant while taking this medication or for 5 months after stopping it. What side effects may I notice from receiving this medication? Side effects that you should report to your care team as soon as possible: Allergic reactions--skin rash, itching, hives, swelling of the face, lips, tongue, or throat Bloody or black, tar-like stools Change in vision Chest pain Diarrhea Dry cough, shortness of breath or trouble breathing Eye pain Fast or irregular heartbeat Fever, chills High blood sugar (hyperglycemia)--increased thirst or amount of urine, unusual weakness or fatigue, blurry vision High thyroid levels (hyperthyroidism)--fast or irregular heartbeat, weight loss, excessive sweating or sensitivity to heat, tremors or shaking, anxiety, nervousness, irregular menstrual cycle or spotting Kidney injury--decrease in the amount of urine, swelling of the ankles, hands, or feet Liver injury--right upper belly pain, loss of appetite, nausea, light-colored stool, dark yellow or brown urine, yellowing skin or eyes, unusual weakness or fatigue Low red blood cell count--unusual weakness or fatigue, dizziness, headache, trouble breathing  Low thyroid levels (hypothyroidism)--unusual weakness or fatigue, increased sensitivity to cold,  constipation, hair loss, dry skin, weight gain, feelings of depression Mood and behavior changes-confusion, change in sex drive or performance, irritability Muscle pain or cramps Pain, tingling, or numbness in the hands or feet, muscle weakness, trouble walking, loss of balance or coordination Red or dark brown urine Redness, blistering, peeling, or loosening of the skin, including inside the mouth Stomach pain Unusual bruising or bleeding Side effects that usually do not require medical attention (report to your care team if they continue or are bothersome): Bone pain Constipation Loss of appetite Nausea Tiredness Vomiting This list may not describe all possible side effects. Call your doctor for medical advice about side effects. You may report side effects to FDA at 1-800-FDA-1088. Where should I keep my medication? This medication is given in a hospital or clinic and will not be stored at home. NOTE: This sheet is a summary. It may not cover all possible information. If you have questions about this medicine, talk to your doctor, pharmacist, or health care provider.  2023 Elsevier/Gold Standard (2021-07-01 00:00:00)

## 2022-01-30 DIAGNOSIS — C3490 Malignant neoplasm of unspecified part of unspecified bronchus or lung: Secondary | ICD-10-CM | POA: Diagnosis not present

## 2022-01-30 DIAGNOSIS — F17210 Nicotine dependence, cigarettes, uncomplicated: Secondary | ICD-10-CM | POA: Diagnosis not present

## 2022-01-30 DIAGNOSIS — Z85118 Personal history of other malignant neoplasm of bronchus and lung: Secondary | ICD-10-CM | POA: Diagnosis not present

## 2022-01-30 DIAGNOSIS — Z136 Encounter for screening for cardiovascular disorders: Secondary | ICD-10-CM | POA: Diagnosis not present

## 2022-01-30 DIAGNOSIS — I700 Atherosclerosis of aorta: Secondary | ICD-10-CM | POA: Diagnosis not present

## 2022-01-30 DIAGNOSIS — Z0000 Encounter for general adult medical examination without abnormal findings: Secondary | ICD-10-CM | POA: Diagnosis not present

## 2022-01-30 DIAGNOSIS — R21 Rash and other nonspecific skin eruption: Secondary | ICD-10-CM | POA: Diagnosis not present

## 2022-02-02 ENCOUNTER — Other Ambulatory Visit: Payer: Self-pay | Admitting: Hematology & Oncology

## 2022-02-07 ENCOUNTER — Ambulatory Visit: Payer: 59

## 2022-02-07 ENCOUNTER — Other Ambulatory Visit: Payer: 59

## 2022-02-07 ENCOUNTER — Inpatient Hospital Stay: Payer: Medicare Other | Admitting: Hematology & Oncology

## 2022-02-10 DIAGNOSIS — F17210 Nicotine dependence, cigarettes, uncomplicated: Secondary | ICD-10-CM | POA: Diagnosis not present

## 2022-02-10 DIAGNOSIS — I700 Atherosclerosis of aorta: Secondary | ICD-10-CM | POA: Diagnosis not present

## 2022-02-15 ENCOUNTER — Inpatient Hospital Stay (HOSPITAL_BASED_OUTPATIENT_CLINIC_OR_DEPARTMENT_OTHER): Payer: Medicare Other | Admitting: Hematology & Oncology

## 2022-02-15 ENCOUNTER — Inpatient Hospital Stay: Payer: Medicare Other | Attending: Hematology & Oncology

## 2022-02-15 ENCOUNTER — Encounter: Payer: Self-pay | Admitting: Hematology & Oncology

## 2022-02-15 ENCOUNTER — Inpatient Hospital Stay: Payer: Medicare Other

## 2022-02-15 VITALS — BP 135/60 | HR 67 | Temp 97.6°F | Resp 18 | Wt 156.0 lb

## 2022-02-15 DIAGNOSIS — C342 Malignant neoplasm of middle lobe, bronchus or lung: Secondary | ICD-10-CM | POA: Diagnosis not present

## 2022-02-15 DIAGNOSIS — Z5112 Encounter for antineoplastic immunotherapy: Secondary | ICD-10-CM | POA: Diagnosis not present

## 2022-02-15 DIAGNOSIS — Z79899 Other long term (current) drug therapy: Secondary | ICD-10-CM | POA: Diagnosis not present

## 2022-02-15 DIAGNOSIS — C3411 Malignant neoplasm of upper lobe, right bronchus or lung: Secondary | ICD-10-CM

## 2022-02-15 DIAGNOSIS — C772 Secondary and unspecified malignant neoplasm of intra-abdominal lymph nodes: Secondary | ICD-10-CM | POA: Diagnosis not present

## 2022-02-15 LAB — CBC WITH DIFFERENTIAL (CANCER CENTER ONLY)
Abs Immature Granulocytes: 0.04 10*3/uL (ref 0.00–0.07)
Basophils Absolute: 0 10*3/uL (ref 0.0–0.1)
Basophils Relative: 0 %
Eosinophils Absolute: 0.2 10*3/uL (ref 0.0–0.5)
Eosinophils Relative: 5 %
HCT: 34.5 % — ABNORMAL LOW (ref 39.0–52.0)
Hemoglobin: 11.5 g/dL — ABNORMAL LOW (ref 13.0–17.0)
Immature Granulocytes: 1 %
Lymphocytes Relative: 17 %
Lymphs Abs: 0.8 10*3/uL (ref 0.7–4.0)
MCH: 32.6 pg (ref 26.0–34.0)
MCHC: 33.3 g/dL (ref 30.0–36.0)
MCV: 97.7 fL (ref 80.0–100.0)
Monocytes Absolute: 0.4 10*3/uL (ref 0.1–1.0)
Monocytes Relative: 9 %
Neutro Abs: 3 10*3/uL (ref 1.7–7.7)
Neutrophils Relative %: 68 %
Platelet Count: 94 10*3/uL — ABNORMAL LOW (ref 150–400)
RBC: 3.53 MIL/uL — ABNORMAL LOW (ref 4.22–5.81)
RDW: 14.6 % (ref 11.5–15.5)
WBC Count: 4.5 10*3/uL (ref 4.0–10.5)
nRBC: 0 % (ref 0.0–0.2)

## 2022-02-15 LAB — CMP (CANCER CENTER ONLY)
ALT: 6 U/L (ref 0–44)
AST: 8 U/L — ABNORMAL LOW (ref 15–41)
Albumin: 3.6 g/dL (ref 3.5–5.0)
Alkaline Phosphatase: 116 U/L (ref 38–126)
Anion gap: 7 (ref 5–15)
BUN: 9 mg/dL (ref 8–23)
CO2: 26 mmol/L (ref 22–32)
Calcium: 8.7 mg/dL — ABNORMAL LOW (ref 8.9–10.3)
Chloride: 104 mmol/L (ref 98–111)
Creatinine: 0.78 mg/dL (ref 0.61–1.24)
GFR, Estimated: >60 mL/min (ref >60)
Glucose, Bld: 169 mg/dL — ABNORMAL HIGH (ref 70–99)
Potassium: 3.3 mmol/L — ABNORMAL LOW (ref 3.5–5.1)
Sodium: 137 mmol/L (ref 135–145)
Total Bilirubin: 0.7 mg/dL (ref 0.3–1.2)
Total Protein: 6 g/dL — ABNORMAL LOW (ref 6.5–8.1)

## 2022-02-15 MED ORDER — SODIUM CHLORIDE 0.9 % IV SOLN
480.0000 mg | Freq: Once | INTRAVENOUS | Status: AC
  Administered 2022-02-15: 480 mg via INTRAVENOUS
  Filled 2022-02-15: qty 48

## 2022-02-15 MED ORDER — SODIUM CHLORIDE 0.9 % IV SOLN: Freq: Once | INTRAVENOUS | Status: AC

## 2022-02-15 MED ORDER — SODIUM CHLORIDE 0.9% FLUSH
10.0000 mL | INTRAVENOUS | Status: DC | PRN
  Administered 2022-02-15: 10 mL

## 2022-02-15 MED ORDER — HEPARIN SOD (PORK) LOCK FLUSH 100 UNIT/ML IV SOLN
500.0000 [IU] | Freq: Once | INTRAVENOUS | Status: AC | PRN
  Administered 2022-02-15: 500 [IU]

## 2022-02-15 NOTE — Patient Instructions (Signed)
[facility]  Discharge Instructions: Thank you for choosing [facility] to provide your oncology and hematology care.   If you have a lab appointment with the [facility], please go directly to the [facility] and check in at the registration area.  Wear comfortable clothing and clothing appropriate for easy access to any Portacath or PICC line.   We strive to give you quality time with your provider. You may need to reschedule your appointment if you arrive late (15 or more minutes).  Arriving late affects you and other patients whose appointments are after yours.  Also, if you miss three or more appointments without notifying the office, you may be dismissed from the clinic at the provider's discretion.      For prescription refill requests, have your pharmacy contact our office and allow 72 hours for refills to be completed.    Today you received the following chemotherapy and/or immunotherapy agents opdivo   To help prevent nausea and vomiting after your treatment, we encourage you to take your nausea medication as directed.  BELOW ARE SYMPTOMS THAT SHOULD BE REPORTED IMMEDIATELY: *FEVER GREATER THAN 100.4 F (38 C) OR HIGHER *CHILLS OR SWEATING *NAUSEA AND VOMITING THAT IS NOT CONTROLLED WITH YOUR NAUSEA MEDICATION *UNUSUAL SHORTNESS OF BREATH *UNUSUAL BRUISING OR BLEEDING *URINARY PROBLEMS (pain or burning when urinating, or frequent urination) *BOWEL PROBLEMS (unusual diarrhea, constipation, pain near the anus) TENDERNESS IN MOUTH AND THROAT WITH OR WITHOUT PRESENCE OF ULCERS (sore throat, sores in mouth, or a toothache) UNUSUAL RASH, SWELLING OR PAIN  UNUSUAL VAGINAL DISCHARGE OR ITCHING   Items with * indicate a potential emergency and should be followed up as soon as possible or go to the Emergency Department if any problems should occur.  Please show the CHEMOTHERAPY ALERT CARD or IMMUNOTHERAPY ALERT CARD at check-in to the  Emergency Department and triage nurse. Should you have questions after your visit or need to cancel or reschedule your appointment, please contact [facility]  [phone] and follow the prompts.  Office hours are 8:00 a.m. to 4:30 p.m. Monday - Friday. Please note that voicemails left after 4:00 p.m. may not be returned until the following business day.  We are closed weekends and major holidays. You have access to a nurse at all times for urgent questions. Please call the main number to the clinic [phone] and follow the prompts.  For any non-urgent questions, you may also contact your provider using MyChart. We now offer e-Visits for anyone 29 and older to request care online for non-urgent symptoms. For details visit [email or web address].   Also download the MyChart app! Go to the app store, search "MyChart", open the app, select [facility], and log in with your MyChart username and password.  Masks are optional in the cancer centers. If you would like for your care team to wear a mask while they are taking care of you, please let them know. For doctor visits, patients may have with them one support person who is at least 81 years old. At this time, visitors are not allowed in the infusion area.

## 2022-02-15 NOTE — Progress Notes (Signed)
Hematology and Oncology Follow Up Visit  Jerome Kelley [id number] 1941-07-10 81 y.o. 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  He did have have a very nice July 4 yesterday.  He was with his wife.  It was a quiet day for both of them.  He enjoyed it.  He really has had no problems with the Opdivo.  He has had no problems with nausea or vomiting.  He has had no cough or shortness of breath.  He is still smoking.  He has had no issues with hypothyroidism.  His last TSH back in May was 4.Kelley.  He has had no diarrhea.  There has been no bleeding.  He has had no leg swelling.  He has had no headaches.  Overall, he has had no problems with pain.  Currently, I would say his performance status is probably Jerome Kelley.      Medications:  Allergies as of 02/15/2022   No Known Allergies      Medication List        Accurate as of February 15, 2022 10:26 AM. If you have any questions, ask your nurse or doctor.          aspirin 81 MG chewable tablet Chew Kelley tablet (81 mg total) by mouth 2 (two) times daily. What changed: when to take this   benazepril 20 MG tablet Commonly known as: LOTENSIN Take 20 mg by mouth daily.   lidocaine-prilocaine cream Commonly known as: EMLA Apply Kelley application. topically as needed. Apply Kelley hour prior to procedure.   lidocaine-prilocaine cream Commonly known  as: EMLA Apply to affected area once   LORazepam 0.5 MG tablet Commonly known as: ATIVAN TAKE Kelley TABLET BY MOUTH EVERY 8 HOURS AS NEEDED FOR ANXIETY   mometasone 0.Kelley % ointment Commonly known as: ELOCON Apply topically daily.   triamcinolone cream 0.Kelley % Commonly known as: KENALOG Apply Kelley application. topically 2 (two) times daily.   vitamin B-12 1000 MCG tablet Commonly known as: CYANOCOBALAMIN Take Kelley tablet (Kelley,000 mcg total) by mouth daily.        Allergies: No Known Allergies  Past Medical History, Surgical history, Social history, and Family History were reviewed and updated.  Review of Systems: Review of Systems  Constitutional: Negative.   HENT: Negative.    Eyes: Negative.   Respiratory: Negative.    Cardiovascular: Negative.   Gastrointestinal: Negative.   Genitourinary: Negative.   Musculoskeletal: Negative.   Skin: Negative.   Neurological:  Positive for dizziness.  Endo/Heme/Allergies: Negative.   Psychiatric/Behavioral: Negative.       Physical Exam:  weight is 156 lb (70.8 kg). His oral temperature is 97.6 F (36.4 C). His blood pressure is 135/60 and his pulse is 67. His respiration is 18 and oxygen saturation is 91%.   Wt Readings from Last 3 Encounters:  02/15/22 156 lb (70.8 kg)  01/17/22 155 lb Kelley.3 oz (70.3  kg)  12/16/21 154 lb (69.9 kg)    Physical Exam Vitals reviewed.  HENT:     Head: Normocephalic and atraumatic.  Eyes:     Pupils: Pupils are equal, round, and reactive to light.  Cardiovascular:     Rate and Rhythm: Normal rate and regular rhythm.     Heart sounds: Normal heart sounds.  Pulmonary:     Effort: Pulmonary effort is normal.     Breath sounds: Normal breath sounds.  Abdominal:     General: Bowel sounds are normal.     Palpations: Abdomen is soft.  Musculoskeletal:        General: No tenderness or deformity. Normal range of motion.     Cervical back: Normal range of motion.  Lymphadenopathy:     Cervical: No  cervical adenopathy.  Skin:    General: Skin is warm and dry.     Findings: No erythema or rash.  Neurological:     Mental Status: He is alert and oriented to person, place, and time.  Psychiatric:        Behavior: Behavior normal.        Thought Content: Thought content normal.        Judgment: Judgment normal.     Lab Results  Component Value Date   WBC 4.5 02/15/2022   HGB 11.5 (L) 02/15/2022   HCT 34.5 (L) 02/15/2022   MCV 97.7 02/15/2022   PLT 94 (L) 02/15/2022   Lab Results  Component Value Date   FERRITIN 213 09/07/2021   IRON 47 10/27/2021   TIBC 244 (L) 10/27/2021   UIBC 197 10/27/2021   IRONPCTSAT 19 10/27/2021   Lab Results  Component Value Date   RETICCTPCT 3.6 (H) 09/07/2021   RBC 3.53 (L) 02/15/2022   No results found for: "KPAFRELGTCHN", "LAMBDASER", "KAPLAMBRATIO" No results found for: "IGGSERUM", "IGA", "IGMSERUM" No results found for: "TOTALPROTELP", "ALBUMINELP", "A1GS", "A2GS", "BETS", "BETA2SER", "GAMS", "MSPIKE", "SPEI"   Chemistry      Component Value Date/Time   NA 137 02/15/2022 0925   NA 140 06/28/2011 0950   K 3.3 (L) 02/15/2022 0925   K 4.7 06/28/2011 0950   CL 104 02/15/2022 0925   CL 100 06/28/2011 0950   CO2 26 02/15/2022 0925   CO2 29 06/28/2011 0950   BUN 9 02/15/2022 0925   BUN 10 06/28/2011 0950   CREATININE 0.78 02/15/2022 0925   CREATININE 0.7 06/28/2011 0950      Component Value Date/Time   CALCIUM 8.7 (L) 02/15/2022 0925   CALCIUM 9.0 06/28/2011 0950   ALKPHOS 116 02/15/2022 0925   ALKPHOS 110 (H) 12/22/2010 0850   AST 8 (L) 02/15/2022 0925   ALT 6 02/15/2022 0925   ALT 18 12/22/2010 0850   BILITOT 0.7 02/15/2022 0925       Impression and Plan: Jerome Kelley is a very pleasant 81 yo caucasian gentleman with previous history of right lung cancer as well as colon cancer 10 years ago. He now has stage IIIb adenocarcinoma of the lung, right middle lobe.   We initially treated him with systemic chemotherapy.  He was  not a candidate for surgery.  He was not a candidate for curative chemoradiation therapy together.  After systemic chemotherapy, we then relied on radiation and low-dose carboplatinum/Taxol.   He subsequently had another relapse.  I am sure his smoking is not helping.  We now have him on immunotherapy.  We will continue him on the nivolumab.  I think he is doing  pretty well with this.   I will plan for a PET scan at the end of the month.  Hopefully, we will see a response.  I just wish she would stop smoking.  I talked him about this.  He just is not willing to do anything to stop.  I will plan to get him back to see [place] in another month.    [name], MD 7/5/202310:26 AM

## 2022-02-15 NOTE — Patient Instructions (Signed)

## 2022-03-06 ENCOUNTER — Other Ambulatory Visit: Payer: Self-pay

## 2022-03-09 ENCOUNTER — Telehealth: Payer: Self-pay | Admitting: *Deleted

## 2022-03-09 ENCOUNTER — Ambulatory Visit (HOSPITAL_COMMUNITY)
Admission: RE | Admit: 2022-03-09 | Discharge: 2022-03-09 | Disposition: A | Discharge status: Home / Self Care | Payer: Medicare Other | Source: Ambulatory Visit | Attending: Hematology & Oncology | Admitting: Hematology & Oncology

## 2022-03-09 DIAGNOSIS — C3411 Malignant neoplasm of upper lobe, right bronchus or lung: Secondary | ICD-10-CM | POA: Diagnosis not present

## 2022-03-09 DIAGNOSIS — R911 Solitary pulmonary nodule: Secondary | ICD-10-CM | POA: Diagnosis not present

## 2022-03-09 LAB — GLUCOSE, CAPILLARY: Glucose-Capillary: 96 mg/dL (ref 70–99)

## 2022-03-09 MED ORDER — FLUDEOXYGLUCOSE F - 18 (FDG) INJECTION
7.7000 | Freq: Once | INTRAVENOUS | Status: AC
  Administered 2022-03-09: 7.7 via INTRAVENOUS

## 2022-03-09 NOTE — Telephone Encounter (Signed)
Pt notified per order of Dr. Marin Olp that "the cancer is stable. There is no obvious growth that we can see.  This is good news."  Pt is appreciative of call and has no questions or concerns at this time.

## 2022-03-09 NOTE — Telephone Encounter (Signed)
-----   Message from [name], MD sent at 03/09/2022  3:41 PM EDT ----- Call and let him know that the cancer is stable.  There is no obvious growth that we can see.  This is good news.

## 2022-03-15 ENCOUNTER — Inpatient Hospital Stay (HOSPITAL_BASED_OUTPATIENT_CLINIC_OR_DEPARTMENT_OTHER): Payer: Medicare Other | Admitting: Hematology & Oncology

## 2022-03-15 ENCOUNTER — Other Ambulatory Visit: Payer: Self-pay

## 2022-03-15 ENCOUNTER — Inpatient Hospital Stay: Payer: Medicare Other

## 2022-03-15 ENCOUNTER — Inpatient Hospital Stay: Payer: Medicare Other | Attending: Hematology & Oncology

## 2022-03-15 ENCOUNTER — Encounter: Payer: Self-pay | Admitting: Hematology & Oncology

## 2022-03-15 VITALS — BP 146/58 | HR 58 | Temp 97.6°F | Resp 16 | Wt 156.0 lb

## 2022-03-15 DIAGNOSIS — C3411 Malignant neoplasm of upper lobe, right bronchus or lung: Secondary | ICD-10-CM

## 2022-03-15 DIAGNOSIS — E079 Disorder of thyroid, unspecified: Secondary | ICD-10-CM

## 2022-03-15 DIAGNOSIS — Z79899 Other long term (current) drug therapy: Secondary | ICD-10-CM | POA: Insufficient documentation

## 2022-03-15 DIAGNOSIS — Z5112 Encounter for antineoplastic immunotherapy: Secondary | ICD-10-CM | POA: Insufficient documentation

## 2022-03-15 DIAGNOSIS — C772 Secondary and unspecified malignant neoplasm of intra-abdominal lymph nodes: Secondary | ICD-10-CM | POA: Diagnosis not present

## 2022-03-15 DIAGNOSIS — C342 Malignant neoplasm of middle lobe, bronchus or lung: Secondary | ICD-10-CM | POA: Insufficient documentation

## 2022-03-15 LAB — CMP (CANCER CENTER ONLY)
ALT: 6 U/L (ref 0–44)
AST: 9 U/L — ABNORMAL LOW (ref 15–41)
Albumin: 3.6 g/dL (ref 3.5–5.0)
Alkaline Phosphatase: 110 U/L (ref 38–126)
Anion gap: 6 (ref 5–15)
BUN: 11 mg/dL (ref 8–23)
CO2: 27 mmol/L (ref 22–32)
Calcium: 8.8 mg/dL — ABNORMAL LOW (ref 8.9–10.3)
Chloride: 106 mmol/L (ref 98–111)
Creatinine: 0.77 mg/dL (ref 0.61–1.24)
GFR, Estimated: >60 mL/min (ref >60)
Glucose, Bld: 138 mg/dL — ABNORMAL HIGH (ref 70–99)
Potassium: 3.6 mmol/L (ref 3.5–5.1)
Sodium: 139 mmol/L (ref 135–145)
Total Bilirubin: 0.8 mg/dL (ref 0.3–1.2)
Total Protein: 5.9 g/dL — ABNORMAL LOW (ref 6.5–8.1)

## 2022-03-15 LAB — CBC WITH DIFFERENTIAL (CANCER CENTER ONLY)
Abs Immature Granulocytes: 0.01 10*3/uL (ref 0.00–0.07)
Basophils Absolute: 0 10*3/uL (ref 0.0–0.1)
Basophils Relative: 0 %
Eosinophils Absolute: 0.2 10*3/uL (ref 0.0–0.5)
Eosinophils Relative: 4 %
HCT: 35.5 % — ABNORMAL LOW (ref 39.0–52.0)
Hemoglobin: 11.7 g/dL — ABNORMAL LOW (ref 13.0–17.0)
Immature Granulocytes: 0 %
Lymphocytes Relative: 16 %
Lymphs Abs: 0.7 10*3/uL (ref 0.7–4.0)
MCH: 31.7 pg (ref 26.0–34.0)
MCHC: 33 g/dL (ref 30.0–36.0)
MCV: 96.2 fL (ref 80.0–100.0)
Monocytes Absolute: 0.5 10*3/uL (ref 0.1–1.0)
Monocytes Relative: 10 %
Neutro Abs: 3.2 10*3/uL (ref 1.7–7.7)
Neutrophils Relative %: 70 %
Platelet Count: 87 10*3/uL — ABNORMAL LOW (ref 150–400)
RBC: 3.69 MIL/uL — ABNORMAL LOW (ref 4.22–5.81)
RDW: 14.2 % (ref 11.5–15.5)
WBC Count: 4.6 10*3/uL (ref 4.0–10.5)
nRBC: 0 % (ref 0.0–0.2)

## 2022-03-15 LAB — LACTATE DEHYDROGENASE: LDH: 160 U/L (ref 98–192)

## 2022-03-15 MED ORDER — SODIUM CHLORIDE 0.9 % IV SOLN: Freq: Once | INTRAVENOUS | Status: AC

## 2022-03-15 MED ORDER — SODIUM CHLORIDE 0.9 % IV SOLN
480.0000 mg | Freq: Once | INTRAVENOUS | Status: AC
  Administered 2022-03-15: 480 mg via INTRAVENOUS
  Filled 2022-03-15: qty 48

## 2022-03-15 MED ORDER — SODIUM CHLORIDE 0.9% FLUSH
10.0000 mL | INTRAVENOUS | Status: DC | PRN
  Administered 2022-03-15: 10 mL

## 2022-03-15 MED ORDER — HEPARIN SOD (PORK) LOCK FLUSH 100 UNIT/ML IV SOLN
500.0000 [IU] | Freq: Once | INTRAVENOUS | Status: AC | PRN
  Administered 2022-03-15: 500 [IU]

## 2022-03-15 NOTE — Progress Notes (Signed)
OK to treat with platelet count 87 per order of Dr. Marin Olp.

## 2022-03-15 NOTE — Patient Instructions (Signed)
[facility]  Discharge Instructions: Thank you for choosing [facility] to provide your oncology and hematology care.   If you have a lab appointment with the [facility], please go directly to the [facility] and check in at the registration area.  Wear comfortable clothing and clothing appropriate for easy access to any Portacath or PICC line.   We strive to give you quality time with your provider. You may need to reschedule your appointment if you arrive late (15 or more minutes).  Arriving late affects you and other patients whose appointments are after yours.  Also, if you miss three or more appointments without notifying the office, you may be dismissed from the clinic at the provider's discretion.      For prescription refill requests, have your pharmacy contact our office and allow 72 hours for refills to be completed.    Today you received the following chemotherapy and/or immunotherapy agents opdivo   To help prevent nausea and vomiting after your treatment, we encourage you to take your nausea medication as directed.  BELOW ARE SYMPTOMS THAT SHOULD BE REPORTED IMMEDIATELY: *FEVER GREATER THAN 100.4 F (38 C) OR HIGHER *CHILLS OR SWEATING *NAUSEA AND VOMITING THAT IS NOT CONTROLLED WITH YOUR NAUSEA MEDICATION *UNUSUAL SHORTNESS OF BREATH *UNUSUAL BRUISING OR BLEEDING *URINARY PROBLEMS (pain or burning when urinating, or frequent urination) *BOWEL PROBLEMS (unusual diarrhea, constipation, pain near the anus) TENDERNESS IN MOUTH AND THROAT WITH OR WITHOUT PRESENCE OF ULCERS (sore throat, sores in mouth, or a toothache) UNUSUAL RASH, SWELLING OR PAIN  UNUSUAL VAGINAL DISCHARGE OR ITCHING   Items with * indicate a potential emergency and should be followed up as soon as possible or go to the Emergency Department if any problems should occur.  Please show the CHEMOTHERAPY ALERT CARD or IMMUNOTHERAPY ALERT CARD at check-in to the  Emergency Department and triage nurse. Should you have questions after your visit or need to cancel or reschedule your appointment, please contact [facility]  [phone] and follow the prompts.  Office hours are 8:00 a.m. to 4:30 p.m. Monday - Friday. Please note that voicemails left after 4:00 p.m. may not be returned until the following business day.  We are closed weekends and major holidays. You have access to a nurse at all times for urgent questions. Please call the main number to the clinic [phone] and follow the prompts.  For any non-urgent questions, you may also contact your provider using MyChart. We now offer e-Visits for anyone 81 and older to request care online for non-urgent symptoms. For details visit [email or web address].   Also download the MyChart app! Go to the app store, search "MyChart", open the app, select [facility], and log in with your MyChart username and password.  Masks are optional in the cancer centers. If you would like for your care team to wear a mask while they are taking care of you, please let them know. For doctor visits, patients may have with them one support person who is at least 81 years old. At this time, visitors are not allowed in the infusion area.

## 2022-03-15 NOTE — Patient Instructions (Signed)

## 2022-03-15 NOTE — Progress Notes (Signed)
Hematology and Oncology Follow Up Visit  Jerome Kelley [id number] Jun 14, 1941 81 y.o. 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 Carmean is here today for follow-up.  Thankfully, he is doing quite well.  We did do a PET scan on him.  This was done on 03/09/2022.  PET scan showed stability in his pulmonary nodules.  I think this is an incredibly good indicator since he is still smoking.  He has had no problems with immunotherapy.  He has had no diarrhea.  His thyroid has been okay.  His last TSH back in May was 4.1.  He is eating okay.  He is having no problems with rashes.  He is having no cough or shortness of breath.  He is having no leg swelling.  There is no headache.  Overall, I would say his performance status is probably ECOG 1.      Medications:  Allergies as of 03/15/2022   No Known Allergies      Medication List        Accurate as of March 15, 2022 11:03 AM. If you have any questions, ask your nurse or doctor.          aspirin 81 MG chewable tablet Chew 1 tablet (81 mg total) by mouth 2 (two) times daily. What changed: when to take this   benazepril 20 MG tablet Commonly known as: LOTENSIN Take 20 mg by mouth daily.   cyanocobalamin 1000 MCG tablet Commonly known as: VITAMIN B12 Take 1 tablet (1,000 mcg total) by mouth daily.    ketoconazole 2 % cream Commonly known as: NIZORAL 1 application   lidocaine-prilocaine cream Commonly known as: EMLA Apply 1 application. topically as needed. Apply 1 hour prior to procedure.   lidocaine-prilocaine cream Commonly known as: EMLA Apply to affected area once   LORazepam 0.5 MG tablet Commonly known as: ATIVAN TAKE 1 TABLET BY MOUTH EVERY 8 HOURS AS NEEDED FOR ANXIETY   mometasone 0.1 % ointment Commonly known as: ELOCON Apply topically daily.   triamcinolone cream 0.1 % Commonly known as: KENALOG Apply 1 application. topically 2 (two) times daily.        Allergies: No Known Allergies  Past Medical History, Surgical history, Social history, and Family History were reviewed and updated.  Review of Systems: Review of Systems  Constitutional: Negative.   HENT: Negative.    Eyes: Negative.   Respiratory: Negative.    Cardiovascular: Negative.   Gastrointestinal: Negative.   Genitourinary: Negative.   Musculoskeletal: Negative.   Skin: Negative.   Neurological:  Positive for dizziness.  Endo/Heme/Allergies: Negative.   Psychiatric/Behavioral: Negative.       Physical Exam:  weight is 156 lb (70.8 kg). His oral temperature is 97.6 F (36.4 C). His blood pressure is 146/58 (abnormal) and his pulse is 58 (abnormal). His respiration is 16 and oxygen saturation is 100%.   Wt Readings from Last  3 Encounters:  03/15/22 156 lb (70.8 kg)  02/15/22 156 lb (70.8 kg)  01/17/22 155 lb 1.3 oz (70.3 kg)    Physical Exam Vitals reviewed.  HENT:     Head: Normocephalic and atraumatic.  Eyes:     Pupils: Pupils are equal, round, and reactive to light.  Cardiovascular:     Rate and Rhythm: Normal rate and regular rhythm.     Heart sounds: Normal heart sounds.  Pulmonary:     Effort: Pulmonary effort is normal.     Breath sounds: Normal breath sounds.  Abdominal:     General: Bowel sounds are normal.     Palpations: Abdomen is soft.  Musculoskeletal:         General: No tenderness or deformity. Normal range of motion.     Cervical back: Normal range of motion.  Lymphadenopathy:     Cervical: No cervical adenopathy.  Skin:    General: Skin is warm and dry.     Findings: No erythema or rash.  Neurological:     Mental Status: He is alert and oriented to person, place, and time.  Psychiatric:        Behavior: Behavior normal.        Thought Content: Thought content normal.        Judgment: Judgment normal.     Lab Results  Component Value Date   WBC 4.6 03/15/2022   HGB 11.7 (L) 03/15/2022   HCT 35.5 (L) 03/15/2022   MCV 96.2 03/15/2022   PLT 87 (L) 03/15/2022   Lab Results  Component Value Date   FERRITIN 213 09/07/2021   IRON 47 10/27/2021   TIBC 244 (L) 10/27/2021   UIBC 197 10/27/2021   IRONPCTSAT 19 10/27/2021   Lab Results  Component Value Date   RETICCTPCT 3.6 (H) 09/07/2021   RBC 3.69 (L) 03/15/2022   No results found for: "KPAFRELGTCHN", "LAMBDASER", "KAPLAMBRATIO" No results found for: "IGGSERUM", "IGA", "IGMSERUM" No results found for: "TOTALPROTELP", "ALBUMINELP", "A1GS", "A2GS", "BETS", "BETA2SER", "GAMS", "MSPIKE", "SPEI"   Chemistry      Component Value Date/Time   NA 139 03/15/2022 0946   NA 140 06/28/2011 0950   K 3.6 03/15/2022 0946   K 4.7 06/28/2011 0950   CL 106 03/15/2022 0946   CL 100 06/28/2011 0950   CO2 27 03/15/2022 0946   CO2 29 06/28/2011 0950   BUN 11 03/15/2022 0946   BUN 10 06/28/2011 0950   CREATININE 0.77 03/15/2022 0946   CREATININE 0.7 06/28/2011 0950      Component Value Date/Time   CALCIUM 8.8 (L) 03/15/2022 0946   CALCIUM 9.0 06/28/2011 0950   ALKPHOS 110 03/15/2022 0946   ALKPHOS 110 (H) 12/22/2010 0850   AST 9 (L) 03/15/2022 0946   ALT 6 03/15/2022 0946   ALT 18 12/22/2010 0850   BILITOT 0.8 03/15/2022 0946       Impression and Plan: Mr. Betty is a very pleasant 81 yo caucasian gentleman with previous history of right lung cancer as well as colon cancer 10  years ago. He now has stage IIIb adenocarcinoma of the lung, right middle lobe.   We initially treated him with systemic chemotherapy.  He was not a candidate for surgery.  He was not a candidate for curative chemoradiation therapy together.  After systemic chemotherapy, we then relied on radiation and low-dose carboplatinum/Taxol.   He subsequently had another relapse.  I am sure his smoking is not helping.  We now have him on immunotherapy.  I am happy that the immunotherapy is worked well.  He has tolerated it well.  His quality of life is doing nicely.  I just wish that he would stop smoking.  I do not think we have to do another PET scan probably until the Fall.  We will go ahead with his nivolumab.  I will then plan to get him back in another 4 weeks.     [name], MD 8/2/202311:03 AM

## 2022-03-16 ENCOUNTER — Other Ambulatory Visit: Payer: Self-pay

## 2022-04-01 ENCOUNTER — Other Ambulatory Visit: Payer: Self-pay

## 2022-04-03 DIAGNOSIS — R21 Rash and other nonspecific skin eruption: Secondary | ICD-10-CM | POA: Diagnosis not present

## 2022-04-12 ENCOUNTER — Inpatient Hospital Stay (HOSPITAL_BASED_OUTPATIENT_CLINIC_OR_DEPARTMENT_OTHER): Payer: Medicare Other | Admitting: Hematology & Oncology

## 2022-04-12 ENCOUNTER — Encounter: Payer: Self-pay | Admitting: Hematology & Oncology

## 2022-04-12 ENCOUNTER — Inpatient Hospital Stay: Payer: Medicare Other

## 2022-04-12 VITALS — BP 117/56 | HR 79 | Temp 97.8°F | Resp 18 | Wt 155.0 lb

## 2022-04-12 DIAGNOSIS — Z79899 Other long term (current) drug therapy: Secondary | ICD-10-CM | POA: Diagnosis not present

## 2022-04-12 DIAGNOSIS — Z5112 Encounter for antineoplastic immunotherapy: Secondary | ICD-10-CM | POA: Diagnosis not present

## 2022-04-12 DIAGNOSIS — C3411 Malignant neoplasm of upper lobe, right bronchus or lung: Secondary | ICD-10-CM

## 2022-04-12 DIAGNOSIS — C342 Malignant neoplasm of middle lobe, bronchus or lung: Secondary | ICD-10-CM | POA: Diagnosis not present

## 2022-04-12 DIAGNOSIS — E039 Hypothyroidism, unspecified: Secondary | ICD-10-CM

## 2022-04-12 DIAGNOSIS — C772 Secondary and unspecified malignant neoplasm of intra-abdominal lymph nodes: Secondary | ICD-10-CM | POA: Diagnosis not present

## 2022-04-12 DIAGNOSIS — E079 Disorder of thyroid, unspecified: Secondary | ICD-10-CM

## 2022-04-12 LAB — CMP (CANCER CENTER ONLY)
ALT: 7 U/L (ref 0–44)
AST: 10 U/L — ABNORMAL LOW (ref 15–41)
Albumin: 3.6 g/dL (ref 3.5–5.0)
Alkaline Phosphatase: 117 U/L (ref 38–126)
Anion gap: 6 (ref 5–15)
BUN: 13 mg/dL (ref 8–23)
CO2: 26 mmol/L (ref 22–32)
Calcium: 9 mg/dL (ref 8.9–10.3)
Chloride: 106 mmol/L (ref 98–111)
Creatinine: 0.84 mg/dL (ref 0.61–1.24)
GFR, Estimated: >60 mL/min (ref >60)
Glucose, Bld: 146 mg/dL — ABNORMAL HIGH (ref 70–99)
Potassium: 3.4 mmol/L — ABNORMAL LOW (ref 3.5–5.1)
Sodium: 138 mmol/L (ref 135–145)
Total Bilirubin: 0.9 mg/dL (ref 0.3–1.2)
Total Protein: 6.3 g/dL — ABNORMAL LOW (ref 6.5–8.1)

## 2022-04-12 LAB — CBC WITH DIFFERENTIAL (CANCER CENTER ONLY)
Abs Immature Granulocytes: 0.02 10*3/uL (ref 0.00–0.07)
Basophils Absolute: 0 10*3/uL (ref 0.0–0.1)
Basophils Relative: 1 %
Eosinophils Absolute: 0.2 10*3/uL (ref 0.0–0.5)
Eosinophils Relative: 4 %
HCT: 35.6 % — ABNORMAL LOW (ref 39.0–52.0)
Hemoglobin: 11.9 g/dL — ABNORMAL LOW (ref 13.0–17.0)
Immature Granulocytes: 1 %
Lymphocytes Relative: 19 %
Lymphs Abs: 0.8 10*3/uL (ref 0.7–4.0)
MCH: 32.2 pg (ref 26.0–34.0)
MCHC: 33.4 g/dL (ref 30.0–36.0)
MCV: 96.5 fL (ref 80.0–100.0)
Monocytes Absolute: 0.4 10*3/uL (ref 0.1–1.0)
Monocytes Relative: 8 %
Neutro Abs: 2.8 10*3/uL (ref 1.7–7.7)
Neutrophils Relative %: 67 %
Platelet Count: 89 10*3/uL — ABNORMAL LOW (ref 150–400)
RBC: 3.69 MIL/uL — ABNORMAL LOW (ref 4.22–5.81)
RDW: 14.6 % (ref 11.5–15.5)
WBC Count: 4.2 10*3/uL (ref 4.0–10.5)
nRBC: 0 % (ref 0.0–0.2)

## 2022-04-12 LAB — TSH: TSH: 2.837 u[IU]/mL (ref 0.350–4.500)

## 2022-04-12 LAB — FERRITIN: Ferritin: 73 ng/mL (ref 24–336)

## 2022-04-12 LAB — IRON AND IRON BINDING CAPACITY (CC-WL,HP ONLY)
Iron: 75 ug/dL (ref 45–182)
Saturation Ratios: 28 % (ref 17.9–39.5)
TIBC: 273 ug/dL (ref 250–450)
UIBC: 198 ug/dL (ref 117–376)

## 2022-04-12 LAB — LACTATE DEHYDROGENASE: LDH: 149 U/L (ref 98–192)

## 2022-04-12 MED ORDER — SODIUM CHLORIDE 0.9 % IV SOLN: Freq: Once | INTRAVENOUS | Status: AC

## 2022-04-12 MED ORDER — HEPARIN SOD (PORK) LOCK FLUSH 100 UNIT/ML IV SOLN: 500.0000 [IU] | Freq: Once | INTRAVENOUS | Status: DC | PRN

## 2022-04-12 MED ORDER — SODIUM CHLORIDE 0.9 % IV SOLN
480.0000 mg | Freq: Once | INTRAVENOUS | Status: AC
  Administered 2022-04-12: 480 mg via INTRAVENOUS
  Filled 2022-04-12: qty 48

## 2022-04-12 MED ORDER — SODIUM CHLORIDE 0.9% FLUSH: 10.0000 mL | INTRAVENOUS | Status: DC | PRN

## 2022-04-12 NOTE — Patient Instructions (Signed)
[facility]  Discharge Instructions: Thank you for choosing [facility] to provide your oncology and hematology care.   If you have a lab appointment with the [facility], please go directly to the [facility] and check in at the registration area.  Wear comfortable clothing and clothing appropriate for easy access to any Portacath or PICC line.   We strive to give you quality time with your provider. You may need to reschedule your appointment if you arrive late (15 or more minutes).  Arriving late affects you and other patients whose appointments are after yours.  Also, if you miss three or more appointments without notifying the office, you may be dismissed from the clinic at the provider's discretion.      For prescription refill requests, have your pharmacy contact our office and allow 72 hours for refills to be completed.    Today you received the following chemotherapy and/or immunotherapy agents Opdivo.      To help prevent nausea and vomiting after your treatment, we encourage you to take your nausea medication as directed.  BELOW ARE SYMPTOMS THAT SHOULD BE REPORTED IMMEDIATELY: *FEVER GREATER THAN 100.4 F (38 C) OR HIGHER *CHILLS OR SWEATING *NAUSEA AND VOMITING THAT IS NOT CONTROLLED WITH YOUR NAUSEA MEDICATION *UNUSUAL SHORTNESS OF BREATH *UNUSUAL BRUISING OR BLEEDING *URINARY PROBLEMS (pain or burning when urinating, or frequent urination) *BOWEL PROBLEMS (unusual diarrhea, constipation, pain near the anus) TENDERNESS IN MOUTH AND THROAT WITH OR WITHOUT PRESENCE OF ULCERS (sore throat, sores in mouth, or a toothache) UNUSUAL RASH, SWELLING OR PAIN  UNUSUAL VAGINAL DISCHARGE OR ITCHING   Items with * indicate a potential emergency and should be followed up as soon as possible or go to the Emergency Department if any problems should occur.  Please show the CHEMOTHERAPY ALERT CARD or IMMUNOTHERAPY ALERT CARD at check-in to the  Emergency Department and triage nurse. Should you have questions after your visit or need to cancel or reschedule your appointment, please contact [facility]  [phone] and follow the prompts.  Office hours are 8:00 a.m. to 4:30 p.m. Monday - Friday. Please note that voicemails left after 4:00 p.m. may not be returned until the following business day.  We are closed weekends and major holidays. You have access to a nurse at all times for urgent questions. Please call the main number to the clinic [phone] and follow the prompts.  For any non-urgent questions, you may also contact your provider using MyChart. We now offer e-Visits for anyone 17 and older to request care online for non-urgent symptoms. For details visit [email or web address].   Also download the MyChart app! Go to the app store, search "MyChart", open the app, select [facility], and log in with your MyChart username and password.  Masks are optional in the cancer centers. If you would like for your care team to wear a mask while they are taking care of you, please let them know. You may have one support person who is at least 81 years old accompany you for your appointments.

## 2022-04-12 NOTE — Patient Instructions (Signed)

## 2022-04-12 NOTE — Progress Notes (Signed)
OK to treat with today's platelets value per Dr. Ennever. 

## 2022-04-12 NOTE — Progress Notes (Signed)
Hematology and Oncology Follow Up Visit  Jerome Kelley [id number] [birth year] 81 y.o. 04/12/2022   Principle Diagnosis:  Stage IIIB ([id number]) adenocarcinoma of the right middle lung History of stage Ia (T1aN0M0) adenocarcinoma the right lung - resected in March 2011 History of stage II adenocarcinoma of the colon-high risk - April 2010 Metastatic adenocarcinoma of the lung --retroperitoneal nodal metastasis and new left lung nodule  --high PD-L1   Current Therapy:        Carboplatin/Alimta/Pembrolizumab - started 05/10/2020- s/p cycle 6 Radiation with weekly carboplatin/Taxol-start on 11/01/2020 -- completed on 12/20/2020 Yervoy/Opdivo --s/p cycle #4  -- start on 09/15/2021 Nivolumab -- maintenance -- s/p cycle #3 -- start on 01/17/2022    Interim History:  Jerome Kelley is here today for follow-up.  As always, he is doing pretty well.  He gets around a little bit more slowly.  I think he did fall a little bit but thankfully did not have any problems and did not have any fractures.  He is eating well.  He has had no change in bowel or bladder habits.  He has had no nausea or vomiting.  He is still smoking.  His last TSH back in May was 4.2.  We are still checking this.  He has had no rashes.  Again, has been no diarrhea.  He has had no leg swelling.  He has had no bleeding.  He has had no headache.  Overall, I was his performance status is probably ECOG 1.        Medications:  Allergies as of 04/12/2022   No Known Allergies      Medication List        Accurate as of April 12, 2022 10:37 AM. If you have any questions, ask your nurse or doctor.          aspirin 81 MG chewable tablet Chew 1 tablet (81 mg total) by mouth 2 (two) times daily. What changed: when to take this   benazepril 20 MG tablet Commonly known as: LOTENSIN Take 20 mg by mouth daily.   cyanocobalamin 1000 MCG tablet Commonly known as: VITAMIN B12 Take 1 tablet (1,000 mcg total) by mouth daily.    ketoconazole 2 % cream Commonly known as: NIZORAL 1 application   lidocaine-prilocaine cream Commonly known as: EMLA Apply 1 application. topically as needed. Apply 1 hour prior to procedure.   lidocaine-prilocaine cream Commonly known as: EMLA Apply to affected area once   LORazepam 0.5 MG tablet Commonly known as: ATIVAN TAKE 1 TABLET BY MOUTH EVERY 8 HOURS AS NEEDED FOR ANXIETY   mometasone 0.1 % ointment Commonly known as: ELOCON Apply topically daily.   triamcinolone cream 0.1 % Commonly known as: KENALOG Apply 1 application. topically 2 (two) times daily.        Allergies: No Known Allergies  Past Medical History, Surgical history, Social history, and Family History were reviewed and updated.  Review of Systems: Review of Systems  Constitutional: Negative.   HENT: Negative.    Eyes: Negative.   Respiratory: Negative.    Cardiovascular: Negative.   Gastrointestinal: Negative.   Genitourinary: Negative.   Musculoskeletal: Negative.   Skin: Negative.   Neurological:  Positive for dizziness.  Endo/Heme/Allergies: Negative.   Psychiatric/Behavioral: Negative.       Physical Exam:  weight is 155 lb (70.3 kg). His oral temperature is 97.8 F (36.6 C). His blood pressure is 117/56 (abnormal) and his pulse is 79. His respiration is 18 and oxygen saturation is  100%.   Wt Readings from Last 3 Encounters:  04/12/22 155 lb (70.3 kg)  03/15/22 156 lb (70.8 kg)  02/15/22 156 lb (70.8 kg)    Physical Exam Vitals reviewed.  HENT:     Head: Normocephalic and atraumatic.  Eyes:     Pupils: Pupils are equal, round, and reactive to light.  Cardiovascular:     Rate and Rhythm: Normal rate and regular rhythm.     Heart sounds: Normal heart sounds.  Pulmonary:     Effort: Pulmonary effort is normal.     Breath sounds: Normal breath sounds.  Abdominal:     General: Bowel sounds are normal.     Palpations: Abdomen is soft.  Musculoskeletal:        General: No  tenderness or deformity. Normal range of motion.     Cervical back: Normal range of motion.  Lymphadenopathy:     Cervical: No cervical adenopathy.  Skin:    General: Skin is warm and dry.     Findings: No erythema or rash.  Neurological:     Mental Status: He is alert and oriented to person, place, and time.  Psychiatric:        Behavior: Behavior normal.        Thought Content: Thought content normal.        Judgment: Judgment normal.     Lab Results  Component Value Date   WBC 4.2 04/12/2022   HGB 11.9 (L) 04/12/2022   HCT 35.6 (L) 04/12/2022   MCV 96.5 04/12/2022   PLT 89 (L) 04/12/2022   Lab Results  Component Value Date   FERRITIN 213 09/07/2021   IRON 47 10/27/2021   TIBC 244 (L) 10/27/2021   UIBC 197 10/27/2021   IRONPCTSAT 19 10/27/2021   Lab Results  Component Value Date   RETICCTPCT 3.6 (H) 09/07/2021   RBC 3.69 (L) 04/12/2022   No results found for: "KPAFRELGTCHN", "LAMBDASER", "KAPLAMBRATIO" No results found for: "IGGSERUM", "IGA", "IGMSERUM" No results found for: "TOTALPROTELP", "ALBUMINELP", "A1GS", "A2GS", "BETS", "BETA2SER", "GAMS", "MSPIKE", "SPEI"   Chemistry      Component Value Date/Time   NA 138 04/12/2022 0951   NA 140 06/28/2011 0950   K 3.4 (L) 04/12/2022 0951   K 4.7 06/28/2011 0950   CL 106 04/12/2022 0951   CL 100 06/28/2011 0950   CO2 26 04/12/2022 0951   CO2 29 06/28/2011 0950   BUN 13 04/12/2022 0951   BUN 10 06/28/2011 0950   CREATININE 0.84 04/12/2022 0951   CREATININE 0.7 06/28/2011 0950      Component Value Date/Time   CALCIUM 9.0 04/12/2022 0951   CALCIUM 9.0 06/28/2011 0950   ALKPHOS 117 04/12/2022 0951   ALKPHOS 110 (H) 12/22/2010 0850   AST 10 (L) 04/12/2022 0951   ALT 7 04/12/2022 0951   ALT 18 12/22/2010 0850   BILITOT 0.9 04/12/2022 0951       Impression and Plan: Jerome Kelley is a very pleasant 81 yo caucasian gentleman with previous history of right lung cancer as well as colon cancer 10 years ago. He now  has stage IIIb adenocarcinoma of the lung, right middle lobe.   We initially treated him with systemic chemotherapy.  He was not a candidate for surgery.  He was not a candidate for curative chemoradiation therapy together.  After systemic chemotherapy, we then relied on radiation and low-dose carboplatinum/Taxol.   He subsequently had another relapse.  I am sure his smoking is not helping.  We  now have him on immunotherapy.  He had a PET scan that was done back in July which showed everything was holding steady.  We will go ahead with his fourth cycle of nivolumab in the "maintenance" setting.  We will then plan to get him back in 4 weeks.    [name], MD 8/30/202310:37 AM

## 2022-04-13 ENCOUNTER — Other Ambulatory Visit: Payer: Self-pay

## 2022-04-14 ENCOUNTER — Other Ambulatory Visit: Payer: Self-pay | Admitting: Hematology & Oncology

## 2022-04-14 ENCOUNTER — Other Ambulatory Visit: Payer: Self-pay

## 2022-04-19 ENCOUNTER — Other Ambulatory Visit: Payer: Self-pay | Admitting: Hematology & Oncology

## 2022-04-19 DIAGNOSIS — C3411 Malignant neoplasm of upper lobe, right bronchus or lung: Secondary | ICD-10-CM

## 2022-04-25 ENCOUNTER — Other Ambulatory Visit: Payer: Self-pay

## 2022-05-10 ENCOUNTER — Encounter: Payer: Self-pay | Admitting: Hematology & Oncology

## 2022-05-10 ENCOUNTER — Inpatient Hospital Stay: Payer: Medicare Other

## 2022-05-10 ENCOUNTER — Other Ambulatory Visit: Payer: Self-pay

## 2022-05-10 ENCOUNTER — Inpatient Hospital Stay (HOSPITAL_BASED_OUTPATIENT_CLINIC_OR_DEPARTMENT_OTHER): Payer: Medicare Other | Admitting: Hematology & Oncology

## 2022-05-10 ENCOUNTER — Inpatient Hospital Stay: Payer: Medicare Other | Attending: Hematology & Oncology

## 2022-05-10 VITALS — BP 130/53 | HR 59 | Resp 17

## 2022-05-10 VITALS — BP 139/56 | HR 70 | Temp 98.1°F | Resp 18 | Ht 68.0 in | Wt 155.0 lb

## 2022-05-10 DIAGNOSIS — C342 Malignant neoplasm of middle lobe, bronchus or lung: Secondary | ICD-10-CM | POA: Insufficient documentation

## 2022-05-10 DIAGNOSIS — E038 Other specified hypothyroidism: Secondary | ICD-10-CM | POA: Diagnosis not present

## 2022-05-10 DIAGNOSIS — Z5112 Encounter for antineoplastic immunotherapy: Secondary | ICD-10-CM | POA: Insufficient documentation

## 2022-05-10 DIAGNOSIS — C772 Secondary and unspecified malignant neoplasm of intra-abdominal lymph nodes: Secondary | ICD-10-CM | POA: Insufficient documentation

## 2022-05-10 DIAGNOSIS — Z79899 Other long term (current) drug therapy: Secondary | ICD-10-CM | POA: Insufficient documentation

## 2022-05-10 DIAGNOSIS — C3411 Malignant neoplasm of upper lobe, right bronchus or lung: Secondary | ICD-10-CM

## 2022-05-10 LAB — CBC WITH DIFFERENTIAL (CANCER CENTER ONLY)
Abs Immature Granulocytes: 0.02 10*3/uL (ref 0.00–0.07)
Basophils Absolute: 0 10*3/uL (ref 0.0–0.1)
Basophils Relative: 1 %
Eosinophils Absolute: 0.2 10*3/uL (ref 0.0–0.5)
Eosinophils Relative: 5 %
HCT: 34.5 % — ABNORMAL LOW (ref 39.0–52.0)
Hemoglobin: 11.7 g/dL — ABNORMAL LOW (ref 13.0–17.0)
Immature Granulocytes: 1 %
Lymphocytes Relative: 18 %
Lymphs Abs: 0.8 10*3/uL (ref 0.7–4.0)
MCH: 32.1 pg (ref 26.0–34.0)
MCHC: 33.9 g/dL (ref 30.0–36.0)
MCV: 94.8 fL (ref 80.0–100.0)
Monocytes Absolute: 0.5 10*3/uL (ref 0.1–1.0)
Monocytes Relative: 12 %
Neutro Abs: 2.8 10*3/uL (ref 1.7–7.7)
Neutrophils Relative %: 63 %
Platelet Count: 89 10*3/uL — ABNORMAL LOW (ref 150–400)
RBC: 3.64 MIL/uL — ABNORMAL LOW (ref 4.22–5.81)
RDW: 14.6 % (ref 11.5–15.5)
WBC Count: 4.4 10*3/uL (ref 4.0–10.5)
nRBC: 0 % (ref 0.0–0.2)

## 2022-05-10 LAB — CMP (CANCER CENTER ONLY)
ALT: 7 U/L (ref 0–44)
AST: 10 U/L — ABNORMAL LOW (ref 15–41)
Albumin: 3.5 g/dL (ref 3.5–5.0)
Alkaline Phosphatase: 111 U/L (ref 38–126)
Anion gap: 6 (ref 5–15)
BUN: 9 mg/dL (ref 8–23)
CO2: 26 mmol/L (ref 22–32)
Calcium: 9 mg/dL (ref 8.9–10.3)
Chloride: 108 mmol/L (ref 98–111)
Creatinine: 0.7 mg/dL (ref 0.61–1.24)
GFR, Estimated: >60 mL/min (ref >60)
Glucose, Bld: 105 mg/dL — ABNORMAL HIGH (ref 70–99)
Potassium: 3.5 mmol/L (ref 3.5–5.1)
Sodium: 140 mmol/L (ref 135–145)
Total Bilirubin: 0.9 mg/dL (ref 0.3–1.2)
Total Protein: 6.2 g/dL — ABNORMAL LOW (ref 6.5–8.1)

## 2022-05-10 LAB — TSH: TSH: 3.044 u[IU]/mL (ref 0.350–4.500)

## 2022-05-10 MED ORDER — SODIUM CHLORIDE 0.9 % IV SOLN: Freq: Once | INTRAVENOUS | Status: AC

## 2022-05-10 MED ORDER — SODIUM CHLORIDE 0.9% FLUSH
10.0000 mL | INTRAVENOUS | Status: DC | PRN
  Administered 2022-05-10: 10 mL

## 2022-05-10 MED ORDER — HEPARIN SOD (PORK) LOCK FLUSH 100 UNIT/ML IV SOLN
500.0000 [IU] | Freq: Once | INTRAVENOUS | Status: AC | PRN
  Administered 2022-05-10: 500 [IU]

## 2022-05-10 MED ORDER — SODIUM CHLORIDE 0.9 % IV SOLN
480.0000 mg | Freq: Once | INTRAVENOUS | Status: AC
  Administered 2022-05-10: 480 mg via INTRAVENOUS
  Filled 2022-05-10: qty 48

## 2022-05-10 NOTE — Progress Notes (Signed)
Hematology and Oncology Follow Up Visit  Jerome Kelley [id number] Jun 20, 1941 81 y.o. 05/10/2022   Principle Diagnosis:  Stage IIIB ([id number]) adenocarcinoma of the right middle lung History of stage Ia (T1aN0M0) adenocarcinoma the right lung - resected in March 2011 History of stage II adenocarcinoma of the colon-high risk - April 2010 Metastatic adenocarcinoma of the lung --retroperitoneal nodal metastasis and new left lung nodule  --high PD-L1   Current Therapy:        Carboplatin/Alimta/Pembrolizumab - started 05/10/2020- s/p cycle 6 Radiation with weekly carboplatin/Taxol-start on 11/01/2020 -- completed on 12/20/2020 Yervoy/Opdivo --s/p cycle #4  -- start on 09/15/2021 Nivolumab -- maintenance -- s/p cycle #3 -- start on 01/17/2022    Interim History:  Jerome Kelley is here today for follow-up.  He is doing quite nicely.  He really has had no complaints since we last saw him.  He has had no cough or shortness of breath.  He has had no nausea or vomiting.  He has had no change in bowel or bladder habits.  There is been no diarrhea.  He has had no rashes.  He has had no weight loss or weight gain.  His last TSH back in August was 2.9.  He has had no headache.  He has had no leg swelling.  He has been pretty active.  He likes to go fishing.  Overall, I would say his performance status is probably ECOG 1.    Medications:  Allergies as of 05/10/2022   No Known Allergies      Medication List        Accurate as of May 10, 2022 10:16 AM. If you have any questions, ask your nurse or doctor.          STOP taking these medications    betamethasone dipropionate 0.05 % ointment Commonly known as: DIPROLENE Stopped by: [name], MD   mometasone 0.1 % ointment Commonly known as: ELOCON Stopped by: [name], MD       TAKE these medications    aspirin 81 MG chewable tablet Chew 1 tablet (81 mg total) by mouth 2 (two) times daily. What changed: when to  take this   benazepril 20 MG tablet Commonly known as: LOTENSIN Take 20 mg by mouth daily.   cyanocobalamin 1000 MCG tablet Commonly known as: VITAMIN B12 Take 1 tablet (1,000 mcg total) by mouth daily.   ketoconazole 2 % cream Commonly known as: NIZORAL 1 application   lidocaine-prilocaine cream Commonly known as: EMLA Apply 1 application. topically as needed. Apply 1 hour prior to procedure.   LORazepam 0.5 MG tablet Commonly known as: ATIVAN TAKE 1 TABLET BY MOUTH EVERY 8 HOURS AS NEEDED FOR ANXIETY   triamcinolone cream 0.1 % Commonly known as: KENALOG Apply 1 application. topically 2 (two) times daily.        Allergies: No Known Allergies  Past Medical History, Surgical history, Social history, and Family History were reviewed and updated.  Review of Systems: Review of Systems  Constitutional: Negative.   HENT: Negative.    Eyes: Negative.   Respiratory: Negative.    Cardiovascular: Negative.   Gastrointestinal: Negative.   Genitourinary: Negative.   Musculoskeletal: Negative.   Skin: Negative.   Neurological:  Positive for dizziness.  Endo/Heme/Allergies: Negative.   Psychiatric/Behavioral: Negative.       Physical Exam:  height is $RemoveB'5\' 8"'jAHORxUx$  (1.727 m) and weight is 155 lb (70.3 kg). His oral temperature is 98.1 F (36.7 C). His blood  pressure is 139/56 (abnormal) and his pulse is 70. His respiration is 18 and oxygen saturation is 100%.   Wt Readings from Last 3 Encounters:  05/10/22 155 lb (70.3 kg)  04/12/22 155 lb (70.3 kg)  03/15/22 156 lb (70.8 kg)    Physical Exam Vitals reviewed.  HENT:     Head: Normocephalic and atraumatic.  Eyes:     Pupils: Pupils are equal, round, and reactive to light.  Cardiovascular:     Rate and Rhythm: Normal rate and regular rhythm.     Heart sounds: Normal heart sounds.  Pulmonary:     Effort: Pulmonary effort is normal.     Breath sounds: Normal breath sounds.  Abdominal:     General: Bowel sounds are  normal.     Palpations: Abdomen is soft.  Musculoskeletal:        General: No tenderness or deformity. Normal range of motion.     Cervical back: Normal range of motion.  Lymphadenopathy:     Cervical: No cervical adenopathy.  Skin:    General: Skin is warm and dry.     Findings: No erythema or rash.  Neurological:     Mental Status: He is alert and oriented to person, place, and time.  Psychiatric:        Behavior: Behavior normal.        Thought Content: Thought content normal.        Judgment: Judgment normal.     Lab Results  Component Value Date   WBC 4.4 05/10/2022   HGB 11.7 (L) 05/10/2022   HCT 34.5 (L) 05/10/2022   MCV 94.8 05/10/2022   PLT 89 (L) 05/10/2022   Lab Results  Component Value Date   FERRITIN 73 04/12/2022   IRON 75 04/12/2022   TIBC 273 04/12/2022   UIBC 198 04/12/2022   IRONPCTSAT 28 04/12/2022   Lab Results  Component Value Date   RETICCTPCT 3.6 (H) 09/07/2021   RBC 3.64 (L) 05/10/2022   No results found for: "KPAFRELGTCHN", "LAMBDASER", "KAPLAMBRATIO" No results found for: "IGGSERUM", "IGA", "IGMSERUM" No results found for: "TOTALPROTELP", "ALBUMINELP", "A1GS", "A2GS", "BETS", "BETA2SER", "GAMS", "MSPIKE", "SPEI"   Chemistry      Component Value Date/Time   NA 140 05/10/2022 0945   NA 140 06/28/2011 0950   K 3.5 05/10/2022 0945   K 4.7 06/28/2011 0950   CL 108 05/10/2022 0945   CL 100 06/28/2011 0950   CO2 26 05/10/2022 0945   CO2 29 06/28/2011 0950   BUN 9 05/10/2022 0945   BUN 10 06/28/2011 0950   CREATININE 0.70 05/10/2022 0945   CREATININE 0.7 06/28/2011 0950      Component Value Date/Time   CALCIUM 9.0 05/10/2022 0945   CALCIUM 9.0 06/28/2011 0950   ALKPHOS 111 05/10/2022 0945   ALKPHOS 110 (H) 12/22/2010 0850   AST 10 (L) 05/10/2022 0945   ALT 7 05/10/2022 0945   ALT 18 12/22/2010 0850   BILITOT 0.9 05/10/2022 0945       Impression and Plan: Jerome Kelley is a very pleasant 81 yo caucasian gentleman with previous  history of right lung cancer as well as colon cancer 10 years ago. He now has stage IIIb adenocarcinoma of the lung, right middle lobe.   We initially treated him with systemic chemotherapy.  He was not a candidate for surgery.  He was not a candidate for curative chemoradiation therapy together.  After systemic chemotherapy, we then relied on radiation and low-dose carboplatinum/Taxol.   He  subsequently had another relapse.  I am sure his smoking is not helping.  We now have him on immunotherapy.  He had a PET scan that was done back in July which showed everything was holding steady.  We will continue him on the nivolumab.  I probably would repeat a PET scan in November.  We will plan to get him back in another 4 weeks.    [name], MD 9/27/202310:16 AM

## 2022-05-10 NOTE — Patient Instructions (Signed)
Nivolumab Injection What is this medication? NIVOLUMAB (nye VOL ue mab) treats some types of cancer. It works by helping your immune system slow or stop the spread of cancer cells. It is a monoclonal antibody. This medicine may be used for other purposes; ask your health care provider or pharmacist if you have questions. COMMON BRAND NAME(S): Opdivo What should I tell my care team before I take this medication? They need to know if you have any of these conditions: Allogeneic stem cell transplant (uses someone else's stem cells) Autoimmune diseases, such as Crohn disease, ulcerative colitis, lupus History of chest radiation Nervous system problems, such as Guillain-Barre syndrome or myasthenia gravis Organ transplant An unusual or allergic reaction to nivolumab, other medications, foods, dyes, or preservatives Pregnant or trying to get pregnant Breast-feeding How should I use this medication? This medication is infused into a vein. It is given in a hospital or clinic setting. A special MedGuide will be given to you before each treatment. Be sure to read this information carefully each time. Talk to your care team about the use of this medication in children. While it may be prescribed for children as young as 12 years for selected conditions, precautions do apply. Overdosage: If you think you have taken too much of this medicine contact a poison control center or emergency room at once. NOTE: This medicine is only for you. Do not share this medicine with others. What if I miss a dose? Keep appointments for follow-up doses. It is important not to miss your dose. Call your care team if you are unable to keep an appointment. What may interact with this medication? Interactions have not been studied. This list may not describe all possible interactions. Give your health care provider a list of all the medicines, herbs, non-prescription drugs, or dietary supplements you use. Also tell them if you  smoke, drink alcohol, or use illegal drugs. Some items may interact with your medicine. What should I watch for while using this medication? Your condition will be monitored carefully while you are receiving this medication. You may need blood work while taking this medication. This medication may cause serious skin reactions. They can happen weeks to months after starting the medication. Contact your care team right away if you notice fevers or flu-like symptoms with a rash. The rash may be red or purple and then turn into blisters or peeling of the skin. You may also notice a red rash with swelling of the face, lips, or lymph nodes in your neck or under your arms. Tell your care team right away if you have any change in your eyesight. Talk to your care team if you are pregnant or think you might be pregnant. A negative pregnancy test is required before starting this medication. A reliable form of contraception is recommended while taking this medication and for 5 months after the last dose. Talk to your care team about effective forms of contraception. Do not breast-feed while taking this medication and for 5 months after the last dose. What side effects may I notice from receiving this medication? Side effects that you should report to your care team as soon as possible: Allergic reactions--skin rash, itching, hives, swelling of the face, lips, tongue, or throat Dry cough, shortness of breath or trouble breathing Eye pain, redness, irritation, or discharge with blurry or decreased vision Heart muscle inflammation--unusual weakness or fatigue, shortness of breath, chest pain, fast or irregular heartbeat, dizziness, swelling of the ankles, feet, or hands Hormone  gland problems--headache, sensitivity to light, unusual weakness or fatigue, dizziness, fast or irregular heartbeat, increased sensitivity to cold or heat, excessive sweating, constipation, hair loss, increased thirst or amount of urine,  tremors or shaking, irritability Infusion reactions--chest pain, shortness of breath or trouble breathing, feeling faint or lightheaded Kidney injury (glomerulonephritis)--decrease in the amount of urine, red or dark brown urine, foamy or bubbly urine, swelling of the ankles, hands, or feet Liver injury--right upper belly pain, loss of appetite, nausea, light-colored stool, dark yellow or brown urine, yellowing skin or eyes, unusual weakness or fatigue Pain, tingling, or numbness in the hands or feet, muscle weakness, change in vision, confusion or trouble speaking, loss of balance or coordination, trouble walking, seizures Rash, fever, and swollen lymph nodes Redness, blistering, peeling, or loosening of the skin, including inside the mouth Sudden or severe stomach pain, bloody diarrhea, fever, nausea, vomiting Side effects that usually do not require medical attention (report these to your care team if they continue or are bothersome): Bone, joint, or muscle pain Diarrhea Fatigue Loss of appetite Nausea Skin rash This list may not describe all possible side effects. Call your doctor for medical advice about side effects. You may report side effects to FDA at 1-800-FDA-1088. Where should I keep my medication? This medication is given in a hospital or clinic. It will not be stored at home. NOTE: This sheet is a summary. It may not cover all possible information. If you have questions about this medicine, talk to your doctor, pharmacist, or health care provider.  2023 Elsevier/Gold Standard (2021-07-01 00:00:00)

## 2022-05-10 NOTE — Patient Instructions (Signed)

## 2022-05-11 ENCOUNTER — Other Ambulatory Visit: Payer: Self-pay

## 2022-05-11 LAB — T4: T4, Total: 6.5 ug/dL (ref 4.5–12.0)

## 2022-05-15 ENCOUNTER — Other Ambulatory Visit: Payer: Self-pay

## 2022-05-16 ENCOUNTER — Other Ambulatory Visit: Payer: Self-pay

## 2022-05-17 ENCOUNTER — Other Ambulatory Visit: Payer: Self-pay

## 2022-05-22 ENCOUNTER — Other Ambulatory Visit: Payer: Self-pay

## 2022-06-07 ENCOUNTER — Inpatient Hospital Stay: Payer: Medicare Other

## 2022-06-07 ENCOUNTER — Inpatient Hospital Stay: Payer: Medicare Other | Attending: Hematology & Oncology

## 2022-06-07 ENCOUNTER — Encounter: Payer: Self-pay | Admitting: Family

## 2022-06-07 ENCOUNTER — Inpatient Hospital Stay (HOSPITAL_BASED_OUTPATIENT_CLINIC_OR_DEPARTMENT_OTHER): Payer: Medicare Other | Admitting: Family

## 2022-06-07 VITALS — BP 125/58 | HR 61 | Temp 97.7°F | Resp 18

## 2022-06-07 VITALS — BP 124/55 | HR 78 | Temp 94.1°F | Resp 18 | Wt 155.0 lb

## 2022-06-07 DIAGNOSIS — Z79899 Other long term (current) drug therapy: Secondary | ICD-10-CM | POA: Diagnosis not present

## 2022-06-07 DIAGNOSIS — E039 Hypothyroidism, unspecified: Secondary | ICD-10-CM

## 2022-06-07 DIAGNOSIS — C3411 Malignant neoplasm of upper lobe, right bronchus or lung: Secondary | ICD-10-CM

## 2022-06-07 DIAGNOSIS — Z5112 Encounter for antineoplastic immunotherapy: Secondary | ICD-10-CM | POA: Diagnosis not present

## 2022-06-07 DIAGNOSIS — C772 Secondary and unspecified malignant neoplasm of intra-abdominal lymph nodes: Secondary | ICD-10-CM | POA: Diagnosis not present

## 2022-06-07 DIAGNOSIS — E038 Other specified hypothyroidism: Secondary | ICD-10-CM

## 2022-06-07 DIAGNOSIS — Z95828 Presence of other vascular implants and grafts: Secondary | ICD-10-CM

## 2022-06-07 DIAGNOSIS — C342 Malignant neoplasm of middle lobe, bronchus or lung: Secondary | ICD-10-CM | POA: Diagnosis not present

## 2022-06-07 LAB — CMP (CANCER CENTER ONLY)
ALT: 8 U/L (ref 0–44)
AST: 12 U/L — ABNORMAL LOW (ref 15–41)
Albumin: 3.6 g/dL (ref 3.5–5.0)
Alkaline Phosphatase: 112 U/L (ref 38–126)
Anion gap: 6 (ref 5–15)
BUN: 10 mg/dL (ref 8–23)
CO2: 27 mmol/L (ref 22–32)
Calcium: 8.7 mg/dL — ABNORMAL LOW (ref 8.9–10.3)
Chloride: 107 mmol/L (ref 98–111)
Creatinine: 0.8 mg/dL (ref 0.61–1.24)
GFR, Estimated: >60 mL/min (ref >60)
Glucose, Bld: 108 mg/dL — ABNORMAL HIGH (ref 70–99)
Potassium: 3.5 mmol/L (ref 3.5–5.1)
Sodium: 140 mmol/L (ref 135–145)
Total Bilirubin: 0.8 mg/dL (ref 0.3–1.2)
Total Protein: 5.9 g/dL — ABNORMAL LOW (ref 6.5–8.1)

## 2022-06-07 LAB — CBC WITH DIFFERENTIAL (CANCER CENTER ONLY)
Abs Immature Granulocytes: 0.02 10*3/uL (ref 0.00–0.07)
Basophils Absolute: 0 10*3/uL (ref 0.0–0.1)
Basophils Relative: 0 %
Eosinophils Absolute: 0.3 10*3/uL (ref 0.0–0.5)
Eosinophils Relative: 7 %
HCT: 35 % — ABNORMAL LOW (ref 39.0–52.0)
Hemoglobin: 11.7 g/dL — ABNORMAL LOW (ref 13.0–17.0)
Immature Granulocytes: 0 %
Lymphocytes Relative: 18 %
Lymphs Abs: 0.8 10*3/uL (ref 0.7–4.0)
MCH: 32.1 pg (ref 26.0–34.0)
MCHC: 33.4 g/dL (ref 30.0–36.0)
MCV: 95.9 fL (ref 80.0–100.0)
Monocytes Absolute: 0.6 10*3/uL (ref 0.1–1.0)
Monocytes Relative: 12 %
Neutro Abs: 2.9 10*3/uL (ref 1.7–7.7)
Neutrophils Relative %: 63 %
Platelet Count: 90 10*3/uL — ABNORMAL LOW (ref 150–400)
RBC: 3.65 MIL/uL — ABNORMAL LOW (ref 4.22–5.81)
RDW: 14.9 % (ref 11.5–15.5)
WBC Count: 4.6 10*3/uL (ref 4.0–10.5)
nRBC: 0 % (ref 0.0–0.2)

## 2022-06-07 LAB — LACTATE DEHYDROGENASE: LDH: 161 U/L (ref 98–192)

## 2022-06-07 LAB — TSH: TSH: 3.213 u[IU]/mL (ref 0.350–4.500)

## 2022-06-07 MED ORDER — SODIUM CHLORIDE 0.9% FLUSH
10.0000 mL | INTRAVENOUS | Status: DC | PRN
  Administered 2022-06-07: 10 mL

## 2022-06-07 MED ORDER — SODIUM CHLORIDE 0.9% FLUSH
10.0000 mL | Freq: Once | INTRAVENOUS | Status: AC
  Administered 2022-06-07: 10 mL via INTRAVENOUS

## 2022-06-07 MED ORDER — HEPARIN SOD (PORK) LOCK FLUSH 100 UNIT/ML IV SOLN
500.0000 [IU] | Freq: Once | INTRAVENOUS | Status: AC | PRN
  Administered 2022-06-07: 500 [IU]

## 2022-06-07 MED ORDER — SODIUM CHLORIDE 0.9 % IV SOLN: Freq: Once | INTRAVENOUS | Status: AC

## 2022-06-07 MED ORDER — SODIUM CHLORIDE 0.9 % IV SOLN
480.0000 mg | Freq: Once | INTRAVENOUS | Status: AC
  Administered 2022-06-07: 480 mg via INTRAVENOUS
  Filled 2022-06-07: qty 48

## 2022-06-07 NOTE — Progress Notes (Signed)
OK to treat with today's platelets value per Lottie Dawson, NP.

## 2022-06-07 NOTE — Progress Notes (Signed)
Hematology and Oncology Follow Up Visit  Jerome Kelley [id number] 1941-04-08 81 y.o. 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 History of stage Ia ([id number]) adenocarcinoma the right lung - resected in March 2011 History of stage II adenocarcinoma of the colon-high risk - April 2010 Metastatic adenocarcinoma of the lung --retroperitoneal nodal metastasis and new left lung nodule  --high PD-L1   Current Therapy:        Carboplatin/Alimta/Pembrolizumab - started 05/10/2020- s/p cycle 6 Radiation with weekly carboplatin/Taxol-start on 11/01/2020 -- completed on 12/20/2020 Yervoy/Opdivo --s/p cycle #4  -- start on 09/15/2021 Nivolumab -- maintenance -- s/p cycle 5 -- start on 01/17/2022   Interim History:  Jerome Kelley is here today for follow-up and treatment. He is doing well and has no complaints at this time.  TSH last month was stable at 3.044. Today's result pending.  He states that he has occasional episodes of dizziness when he stands too quickly.  No fever, chills, n/v, cough, rash, SOB, chest pain, palpitations, abdominal pain or changes in bowel or bladder habits.  No blood loss, bruising or petechiae.  No swelling, tenderness, numbness or tingling in his extremities.  No falls or syncope reported.  Appetite and hydration have been good. Weight is 155 lbs.   ECOG Performance Status: 1 - Symptomatic but completely ambulatory  Medications:  Allergies as of 06/07/2022   No Known Allergies      Medication List        Accurate as of June 07, 2022 10:12 AM. If you have any questions, ask your nurse or doctor.          aspirin 81 MG chewable tablet Chew 1 tablet (81 mg total) by mouth 2 (two) times daily. What changed: when to take this   benazepril 20 MG tablet Commonly known as: LOTENSIN Take 20 mg by mouth daily.   cyanocobalamin 1000 MCG tablet Commonly known as: VITAMIN B12 Take 1 tablet (1,000 mcg total)  by mouth daily.   ketoconazole 2 % cream Commonly known as: NIZORAL 1 application   lidocaine-prilocaine cream Commonly known as: EMLA Apply 1 application. topically as needed. Apply 1 hour prior to procedure.   LORazepam 0.5 MG tablet Commonly known as: ATIVAN TAKE 1 TABLET BY MOUTH EVERY 8 HOURS AS NEEDED FOR ANXIETY   Stool Softener 100 MG capsule Generic drug: docusate sodium Take 100 mg by mouth daily as needed for mild constipation.   triamcinolone cream 0.1 % Commonly known as: KENALOG Apply 1 application. topically 2 (two) times daily.        Allergies: No Known Allergies  Past Medical History, Surgical history, Social history, and Family History were reviewed and updated.  Review of Systems: All other 10 point review of systems is negative.   Physical Exam:  weight is 155 lb (70.3 kg). His axillary temperature is 94.1 F (34.5 C) (abnormal). His blood pressure is 124/55 (abnormal) and his pulse is 78. His respiration is 18 and oxygen saturation is 100%.   Wt Readings from Last 3 Encounters:  06/07/22 155 lb (70.3 kg)  05/10/22 155 lb (70.3 kg)  04/12/22 155 lb (70.3 kg)    Ocular: Sclerae unicteric, pupils equal, round and reactive to light Ear-nose-throat: Oropharynx clear, dentition fair Lymphatic: No cervical or supraclavicular adenopathy Lungs no rales or rhonchi, good excursion bilaterally Heart regular rate and rhythm, no murmur appreciated Abd soft, nontender, positive bowel sounds MSK no focal spinal tenderness, no joint  edema Neuro: non-focal, well-oriented, appropriate affect Breasts: Deferred   Lab Results  Component Value Date   WBC 4.6 06/07/2022   HGB 11.7 (L) 06/07/2022   HCT 35.0 (L) 06/07/2022   MCV 95.9 06/07/2022   PLT 90 (L) 06/07/2022   Lab Results  Component Value Date   FERRITIN 73 04/12/2022   IRON 75 04/12/2022   TIBC 273 04/12/2022   UIBC 198 04/12/2022   IRONPCTSAT 28 04/12/2022   Lab Results  Component Value  Date   RETICCTPCT 3.6 (H) 09/07/2021   RBC 3.65 (L) 06/07/2022   No results found for: "KPAFRELGTCHN", "LAMBDASER", "KAPLAMBRATIO" No results found for: "IGGSERUM", "IGA", "IGMSERUM" No results found for: "TOTALPROTELP", "ALBUMINELP", "A1GS", "A2GS", "BETS", "BETA2SER", "GAMS", "MSPIKE", "SPEI"   Chemistry      Component Value Date/Time   NA 140 06/07/2022 0923   NA 140 06/28/2011 0950   K 3.5 06/07/2022 0923   K 4.7 06/28/2011 0950   CL 107 06/07/2022 0923   CL 100 06/28/2011 0950   CO2 27 06/07/2022 0923   CO2 29 06/28/2011 0950   BUN 10 06/07/2022 0923   BUN 10 06/28/2011 0950   CREATININE 0.80 06/07/2022 0923   CREATININE 0.7 06/28/2011 0950      Component Value Date/Time   CALCIUM 8.7 (L) 06/07/2022 0923   CALCIUM 9.0 06/28/2011 0950   ALKPHOS 112 06/07/2022 0923   ALKPHOS 110 (H) 12/22/2010 0850   AST 12 (L) 06/07/2022 0923   ALT 8 06/07/2022 0923   ALT 18 12/22/2010 0850   BILITOT 0.8 06/07/2022 0923       Impression and Plan: Jerome Kelley is a very pleasant 81 yo caucasian gentleman with previous history of right lung cancer as well as colon cancer 10 years ago. He now has stage IIIb adenocarcinoma of the lung, right middle lobe.  We initially treated him with systemic chemotherapy.  He was not a candidate for surgery or a candidate for curative chemoradiation therapy together. After systemic chemotherapy, we then relied on radiation and low-dose carboplatinum/Taxol.  He subsequently had another relapse and is now on immunotherapy.So far he is tolerating this well.  We will proceed with treatment today as planned.  We will get another PET scan on him in 2 weeks.  Follow-up in 4 weeks with MD.   Lottie Dawson, NP 10/25/202310:12 AM

## 2022-06-07 NOTE — Patient Instructions (Signed)
[facility]  Discharge Instructions: Thank you for choosing [facility] to provide your oncology and hematology care.   If you have a lab appointment with the [facility], please go directly to the [facility] and check in at the registration area.  Wear comfortable clothing and clothing appropriate for easy access to any Portacath or PICC line.   We strive to give you quality time with your provider. You may need to reschedule your appointment if you arrive late (15 or more minutes).  Arriving late affects you and other patients whose appointments are after yours.  Also, if you miss three or more appointments without notifying the office, you may be dismissed from the clinic at the provider's discretion.      For prescription refill requests, have your pharmacy contact our office and allow 72 hours for refills to be completed.    Today you received the following chemotherapy and/or immunotherapy agents Opdivo.      To help prevent nausea and vomiting after your treatment, we encourage you to take your nausea medication as directed.  BELOW ARE SYMPTOMS THAT SHOULD BE REPORTED IMMEDIATELY: *FEVER GREATER THAN 100.4 F (38 C) OR HIGHER *CHILLS OR SWEATING *NAUSEA AND VOMITING THAT IS NOT CONTROLLED WITH YOUR NAUSEA MEDICATION *UNUSUAL SHORTNESS OF BREATH *UNUSUAL BRUISING OR BLEEDING *URINARY PROBLEMS (pain or burning when urinating, or frequent urination) *BOWEL PROBLEMS (unusual diarrhea, constipation, pain near the anus) TENDERNESS IN MOUTH AND THROAT WITH OR WITHOUT PRESENCE OF ULCERS (sore throat, sores in mouth, or a toothache) UNUSUAL RASH, SWELLING OR PAIN  UNUSUAL VAGINAL DISCHARGE OR ITCHING   Items with * indicate a potential emergency and should be followed up as soon as possible or go to the Emergency Department if any problems should occur.  Please show the CHEMOTHERAPY ALERT CARD or IMMUNOTHERAPY ALERT CARD at check-in to the  Emergency Department and triage nurse. Should you have questions after your visit or need to cancel or reschedule your appointment, please contact [facility]  [phone] and follow the prompts.  Office hours are 8:00 a.m. to 4:30 p.m. Monday - Friday. Please note that voicemails left after 4:00 p.m. may not be returned until the following business day.  We are closed weekends and major holidays. You have access to a nurse at all times for urgent questions. Please call the main number to the clinic [phone] and follow the prompts.  For any non-urgent questions, you may also contact your provider using MyChart. We now offer e-Visits for anyone 30 and older to request care online for non-urgent symptoms. For details visit [email or web address].   Also download the MyChart app! Go to the app store, search "MyChart", open the app, select , and log in with your MyChart username and password.  Masks are optional in the cancer centers. If you would like for your care team to wear a mask while they are taking care of you, please let them know. You may have one support person who is at least 81 years old accompany you for your appointments.

## 2022-06-08 ENCOUNTER — Other Ambulatory Visit: Payer: Self-pay

## 2022-06-08 ENCOUNTER — Ambulatory Visit: Payer: 59 | Admitting: Family

## 2022-06-08 ENCOUNTER — Inpatient Hospital Stay: Payer: Medicare Other

## 2022-06-08 ENCOUNTER — Inpatient Hospital Stay: Payer: Medicare Other | Admitting: Family

## 2022-06-08 ENCOUNTER — Other Ambulatory Visit: Payer: 59

## 2022-06-08 ENCOUNTER — Ambulatory Visit: Payer: 59

## 2022-06-09 ENCOUNTER — Other Ambulatory Visit: Payer: Self-pay

## 2022-06-10 ENCOUNTER — Other Ambulatory Visit: Payer: Self-pay

## 2022-06-23 ENCOUNTER — Ambulatory Visit (HOSPITAL_COMMUNITY)
Admission: RE | Admit: 2022-06-23 | Discharge: 2022-06-23 | Disposition: A | Discharge status: Home / Self Care | Payer: Medicare Other | Source: Ambulatory Visit | Attending: Family | Admitting: Family

## 2022-06-23 DIAGNOSIS — C3431 Malignant neoplasm of lower lobe, right bronchus or lung: Secondary | ICD-10-CM | POA: Diagnosis not present

## 2022-06-23 DIAGNOSIS — R911 Solitary pulmonary nodule: Secondary | ICD-10-CM | POA: Diagnosis not present

## 2022-06-23 DIAGNOSIS — C3411 Malignant neoplasm of upper lobe, right bronchus or lung: Secondary | ICD-10-CM | POA: Diagnosis not present

## 2022-06-23 LAB — GLUCOSE, CAPILLARY: Glucose-Capillary: 100 mg/dL — ABNORMAL HIGH (ref 70–99)

## 2022-06-23 MED ORDER — FLUDEOXYGLUCOSE F - 18 (FDG) INJECTION
7.5000 | Freq: Once | INTRAVENOUS | Status: AC | PRN
  Administered 2022-06-23: 7.7 via INTRAVENOUS

## 2022-06-26 ENCOUNTER — Other Ambulatory Visit: Payer: Self-pay | Admitting: Hematology & Oncology

## 2022-06-26 ENCOUNTER — Encounter: Payer: Self-pay | Admitting: *Deleted

## 2022-06-26 NOTE — Progress Notes (Signed)
Approval received for Lidocaine-Prilocaine cream from [facility] from 06/26/22 until further notice.  Authorization # [id number]

## 2022-06-27 ENCOUNTER — Telehealth: Payer: Self-pay

## 2022-06-27 DIAGNOSIS — R21 Rash and other nonspecific skin eruption: Secondary | ICD-10-CM | POA: Diagnosis not present

## 2022-06-27 NOTE — Telephone Encounter (Signed)
Called and informed patient of lab results, patient verbalized understanding and denies any questions or concerns at this time.   

## 2022-06-27 NOTE — Telephone Encounter (Signed)
-----   Message from [name], MD sent at 06/26/2022  4:41 PM EST ----- Call and let him know that everything looks stable on the CT scan.  There is no cancer that looks to be spreading or active.  This is a very good sign.  Happy Thanksgiving.  Thanks.  Laurey Arrow

## 2022-07-03 ENCOUNTER — Encounter: Payer: Self-pay | Admitting: Hematology & Oncology

## 2022-07-03 ENCOUNTER — Other Ambulatory Visit: Payer: Self-pay

## 2022-07-03 ENCOUNTER — Inpatient Hospital Stay: Payer: Medicare Other

## 2022-07-03 ENCOUNTER — Inpatient Hospital Stay: Payer: Medicare Other | Attending: Hematology & Oncology

## 2022-07-03 ENCOUNTER — Inpatient Hospital Stay (HOSPITAL_BASED_OUTPATIENT_CLINIC_OR_DEPARTMENT_OTHER): Payer: Medicare Other | Admitting: Hematology & Oncology

## 2022-07-03 VITALS — BP 133/57 | HR 80 | Temp 97.4°F | Resp 18 | Ht 68.0 in | Wt 155.0 lb

## 2022-07-03 VITALS — BP 133/50 | HR 61 | Temp 97.9°F | Resp 16

## 2022-07-03 DIAGNOSIS — C772 Secondary and unspecified malignant neoplasm of intra-abdominal lymph nodes: Secondary | ICD-10-CM | POA: Diagnosis not present

## 2022-07-03 DIAGNOSIS — Z5112 Encounter for antineoplastic immunotherapy: Secondary | ICD-10-CM | POA: Diagnosis not present

## 2022-07-03 DIAGNOSIS — E039 Hypothyroidism, unspecified: Secondary | ICD-10-CM

## 2022-07-03 DIAGNOSIS — Z79899 Other long term (current) drug therapy: Secondary | ICD-10-CM | POA: Diagnosis not present

## 2022-07-03 DIAGNOSIS — C3411 Malignant neoplasm of upper lobe, right bronchus or lung: Secondary | ICD-10-CM | POA: Diagnosis not present

## 2022-07-03 DIAGNOSIS — E038 Other specified hypothyroidism: Secondary | ICD-10-CM | POA: Diagnosis not present

## 2022-07-03 DIAGNOSIS — C342 Malignant neoplasm of middle lobe, bronchus or lung: Secondary | ICD-10-CM | POA: Diagnosis not present

## 2022-07-03 LAB — CMP (CANCER CENTER ONLY)
ALT: 7 U/L (ref 0–44)
AST: 11 U/L — ABNORMAL LOW (ref 15–41)
Albumin: 3.5 g/dL (ref 3.5–5.0)
Alkaline Phosphatase: 137 U/L — ABNORMAL HIGH (ref 38–126)
Anion gap: 6 (ref 5–15)
BUN: 10 mg/dL (ref 8–23)
CO2: 26 mmol/L (ref 22–32)
Calcium: 8.7 mg/dL — ABNORMAL LOW (ref 8.9–10.3)
Chloride: 107 mmol/L (ref 98–111)
Creatinine: 0.73 mg/dL (ref 0.61–1.24)
GFR, Estimated: >60 mL/min (ref >60)
Glucose, Bld: 113 mg/dL — ABNORMAL HIGH (ref 70–99)
Potassium: 3.3 mmol/L — ABNORMAL LOW (ref 3.5–5.1)
Sodium: 139 mmol/L (ref 135–145)
Total Bilirubin: 0.8 mg/dL (ref 0.3–1.2)
Total Protein: 6 g/dL — ABNORMAL LOW (ref 6.5–8.1)

## 2022-07-03 LAB — CBC WITH DIFFERENTIAL (CANCER CENTER ONLY)
Abs Immature Granulocytes: 0.01 10*3/uL (ref 0.00–0.07)
Basophils Absolute: 0 10*3/uL (ref 0.0–0.1)
Basophils Relative: 1 %
Eosinophils Absolute: 0.3 10*3/uL (ref 0.0–0.5)
Eosinophils Relative: 6 %
HCT: 33.9 % — ABNORMAL LOW (ref 39.0–52.0)
Hemoglobin: 11.4 g/dL — ABNORMAL LOW (ref 13.0–17.0)
Immature Granulocytes: 0 %
Lymphocytes Relative: 18 %
Lymphs Abs: 0.8 10*3/uL (ref 0.7–4.0)
MCH: 31.9 pg (ref 26.0–34.0)
MCHC: 33.6 g/dL (ref 30.0–36.0)
MCV: 95 fL (ref 80.0–100.0)
Monocytes Absolute: 0.5 10*3/uL (ref 0.1–1.0)
Monocytes Relative: 12 %
Neutro Abs: 2.6 10*3/uL (ref 1.7–7.7)
Neutrophils Relative %: 63 %
Platelet Count: 101 10*3/uL — ABNORMAL LOW (ref 150–400)
RBC: 3.57 MIL/uL — ABNORMAL LOW (ref 4.22–5.81)
RDW: 14.7 % (ref 11.5–15.5)
WBC Count: 4.2 10*3/uL (ref 4.0–10.5)
nRBC: 0 % (ref 0.0–0.2)

## 2022-07-03 LAB — TSH: TSH: 4.006 u[IU]/mL (ref 0.350–4.500)

## 2022-07-03 LAB — LACTATE DEHYDROGENASE: LDH: 176 U/L (ref 98–192)

## 2022-07-03 MED ORDER — SODIUM CHLORIDE 0.9 % IV SOLN
480.0000 mg | Freq: Once | INTRAVENOUS | Status: AC
  Administered 2022-07-03: 480 mg via INTRAVENOUS
  Filled 2022-07-03: qty 48

## 2022-07-03 MED ORDER — SODIUM CHLORIDE 0.9% FLUSH: 3.0000 mL | INTRAVENOUS | Status: DC | PRN

## 2022-07-03 MED ORDER — SODIUM CHLORIDE 0.9% FLUSH
10.0000 mL | INTRAVENOUS | Status: DC | PRN
  Administered 2022-07-03: 10 mL

## 2022-07-03 MED ORDER — SODIUM CHLORIDE 0.9 % IV SOLN: Freq: Once | INTRAVENOUS | Status: AC

## 2022-07-03 MED ORDER — HEPARIN SOD (PORK) LOCK FLUSH 100 UNIT/ML IV SOLN
500.0000 [IU] | Freq: Once | INTRAVENOUS | Status: AC | PRN
  Administered 2022-07-03: 500 [IU]

## 2022-07-03 NOTE — Progress Notes (Signed)
Hematology and Oncology Follow Up Visit  DERRIC DEALMEIDA [id number] 1940/08/31 81 y.o. 07/03/2022   Principle Diagnosis:  Stage IIIB ([id number]) adenocarcinoma of the right middle lung History of stage Ia (T1aN0M0) adenocarcinoma the right lung - resected in March 2011 History of stage II adenocarcinoma of the colon-high risk - April 2010 Metastatic adenocarcinoma of the lung --retroperitoneal nodal metastasis and new left lung nodule  --high PD-L1   Current Therapy:        Carboplatin/Alimta/Pembrolizumab - started 05/10/2020- s/p cycle 6 Radiation with weekly carboplatin/Taxol-start on 11/01/2020 -- completed on 12/20/2020 Yervoy/Opdivo --s/p cycle #4  -- start on 09/15/2021 Nivolumab -- maintenance -- s/p cycle #6 -- start on 01/17/2022    Interim History:  Mr. Ahart is here today for follow-up.   We did go ahead and do a PET scan on him.  This was done on 06/23/2022.  Thankfully, the PET scan did not show any evidence of progressive disease.  He still has some radiation changes in the right perihilar area.  There is a peripheral right middle lobe nodule that has some metabolic activity.  Measures 7 mm.  I did happy that he is doing so well.  He still smokes about a pack a day of cigarettes.  He has had no problems with diarrhea.  He has had no problems with nausea or vomiting.    He has good appetite.  He has had no increased cough or shortness of breath.  He has had no leg swelling.  His last TSH back in October was 3.2.  Overall, I was his performance status is probably ECOG 1.   Medications:  Allergies as of 07/03/2022   No Known Allergies      Medication List        Accurate as of July 03, 2022  9:41 AM. If you have any questions, ask your nurse or doctor.          aspirin 81 MG chewable tablet Chew 1 tablet (81 mg total) by mouth 2 (two) times daily. What changed: when to take this   benazepril 20 MG tablet Commonly known as: LOTENSIN Take 20 mg by mouth  daily.   cyanocobalamin 1000 MCG tablet Commonly known as: VITAMIN B12 Take 1 tablet (1,000 mcg total) by mouth daily.   ketoconazole 2 % cream Commonly known as: NIZORAL 1 application   lidocaine-prilocaine cream Commonly known as: EMLA Apply 1 application. topically as needed. Apply 1 hour prior to procedure.   LORazepam 0.5 MG tablet Commonly known as: ATIVAN TAKE 1 TABLET BY MOUTH EVERY 8 HOURS AS NEEDED FOR ANXIETY   Stool Softener 100 MG capsule Generic drug: docusate sodium Take 100 mg by mouth daily as needed for mild constipation.   triamcinolone cream 0.1 % Commonly known as: KENALOG Apply 1 application. topically 2 (two) times daily.        Allergies: No Known Allergies  Past Medical History, Surgical history, Social history, and Family History were reviewed and updated.  Review of Systems: Review of Systems  Constitutional: Negative.   HENT: Negative.    Eyes: Negative.   Respiratory: Negative.    Cardiovascular: Negative.   Gastrointestinal: Negative.   Genitourinary: Negative.   Musculoskeletal: Negative.   Skin: Negative.   Neurological:  Positive for dizziness.  Endo/Heme/Allergies: Negative.   Psychiatric/Behavioral: Negative.       Physical Exam:  height is _0  (1.727 m) and weight is 155 lb (70.3 kg). His oral temperature is 97.4 F (36.3  C) (abnormal). His blood pressure is 133/57 (abnormal) and his pulse is 80. His respiration is 18 and oxygen saturation is 99%.   Wt Readings from Last 3 Encounters:  07/03/22 155 lb (70.3 kg)  06/07/22 155 lb (70.3 kg)  05/10/22 155 lb (70.3 kg)    Physical Exam Vitals reviewed.  HENT:     Head: Normocephalic and atraumatic.  Eyes:     Pupils: Pupils are equal, round, and reactive to light.  Cardiovascular:     Rate and Rhythm: Normal rate and regular rhythm.     Heart sounds: Normal heart sounds.  Pulmonary:     Effort: Pulmonary effort is normal.     Breath sounds: Normal breath sounds.   Abdominal:     General: Bowel sounds are normal.     Palpations: Abdomen is soft.  Musculoskeletal:        General: No tenderness or deformity. Normal range of motion.     Cervical back: Normal range of motion.  Lymphadenopathy:     Cervical: No cervical adenopathy.  Skin:    General: Skin is warm and dry.     Findings: No erythema or rash.  Neurological:     Mental Status: He is alert and oriented to person, place, and time.  Psychiatric:        Behavior: Behavior normal.        Thought Content: Thought content normal.        Judgment: Judgment normal.     Lab Results  Component Value Date   WBC 4.2 07/03/2022   HGB 11.4 (L) 07/03/2022   HCT 33.9 (L) 07/03/2022   MCV 95.0 07/03/2022   PLT 101 (L) 07/03/2022   Lab Results  Component Value Date   FERRITIN 73 04/12/2022   IRON 75 04/12/2022   TIBC 273 04/12/2022   UIBC 198 04/12/2022   IRONPCTSAT 28 04/12/2022   Lab Results  Component Value Date   RETICCTPCT 3.6 (H) 09/07/2021   RBC 3.57 (L) 07/03/2022   No results found for: "KPAFRELGTCHN", "LAMBDASER", "KAPLAMBRATIO" No results found for: "IGGSERUM", "IGA", "IGMSERUM" No results found for: "TOTALPROTELP", "ALBUMINELP", "A1GS", "A2GS", "BETS", "BETA2SER", "GAMS", "MSPIKE", "SPEI"   Chemistry      Component Value Date/Time   NA 139 07/03/2022 0900   NA 140 06/28/2011 0950   K 3.3 (L) 07/03/2022 0900   K 4.7 06/28/2011 0950   CL 107 07/03/2022 0900   CL 100 06/28/2011 0950   CO2 26 07/03/2022 0900   CO2 29 06/28/2011 0950   BUN 10 07/03/2022 0900   BUN 10 06/28/2011 0950   CREATININE 0.73 07/03/2022 0900   CREATININE 0.7 06/28/2011 0950      Component Value Date/Time   CALCIUM 8.7 (L) 07/03/2022 0900   CALCIUM 9.0 06/28/2011 0950   ALKPHOS 137 (H) 07/03/2022 0900   ALKPHOS 110 (H) 12/22/2010 0850   AST 11 (L) 07/03/2022 0900   ALT 7 07/03/2022 0900   ALT 18 12/22/2010 0850   BILITOT 0.8 07/03/2022 0900       Impression and Plan: Mr. Karwowski is a  very pleasant 81 yo caucasian gentleman with previous history of right lung cancer as well as colon cancer 10 years ago. He now has stage IIIb adenocarcinoma of the lung, right middle lobe.   We initially treated him with systemic chemotherapy.  He was not a candidate for surgery.  He was not a candidate for curative chemoradiation therapy together.  I must say that he is doing  quite nicely with nivolumab.  I am just happy that he has stable disease.  He is asymptomatic.  He has had no problems with the nivolumab.  I may think about spreading out the nivolumab treatments.  He is on every 4 weeks now.  If every thing continues to stabilize, we might go to every 6 weeks.  We will plan to get her back in 4 weeks.   [name], MD 11/20/20239:41 AM

## 2022-07-03 NOTE — Patient Instructions (Signed)

## 2022-07-03 NOTE — Patient Instructions (Signed)
[facility]  Discharge Instructions: Thank you for choosing [facility] to provide your oncology and hematology care.   If you have a lab appointment with the [facility], please go directly to the [facility] and check in at the registration area.  Wear comfortable clothing and clothing appropriate for easy access to any Portacath or PICC line.   We strive to give you quality time with your provider. You may need to reschedule your appointment if you arrive late (15 or more minutes).  Arriving late affects you and other patients whose appointments are after yours.  Also, if you miss three or more appointments without notifying the office, you may be dismissed from the clinic at the provider's discretion.      For prescription refill requests, have your pharmacy contact our office and allow 72 hours for refills to be completed.    Today you received the following chemotherapy and/or immunotherapy agents Opdivo      To help prevent nausea and vomiting after your treatment, we encourage you to take your nausea medication as directed.  BELOW ARE SYMPTOMS THAT SHOULD BE REPORTED IMMEDIATELY: *FEVER GREATER THAN 100.4 F (38 C) OR HIGHER *CHILLS OR SWEATING *NAUSEA AND VOMITING THAT IS NOT CONTROLLED WITH YOUR NAUSEA MEDICATION *UNUSUAL SHORTNESS OF BREATH *UNUSUAL BRUISING OR BLEEDING *URINARY PROBLEMS (pain or burning when urinating, or frequent urination) *BOWEL PROBLEMS (unusual diarrhea, constipation, pain near the anus) TENDERNESS IN MOUTH AND THROAT WITH OR WITHOUT PRESENCE OF ULCERS (sore throat, sores in mouth, or a toothache) UNUSUAL RASH, SWELLING OR PAIN  UNUSUAL VAGINAL DISCHARGE OR ITCHING   Items with * indicate a potential emergency and should be followed up as soon as possible or go to the Emergency Department if any problems should occur.  Please show the CHEMOTHERAPY ALERT CARD or IMMUNOTHERAPY ALERT CARD at check-in to the  Emergency Department and triage nurse. Should you have questions after your visit or need to cancel or reschedule your appointment, please contact [facility]  [phone] and follow the prompts.  Office hours are 8:00 a.m. to 4:30 p.m. Monday - Friday. Please note that voicemails left after 4:00 p.m. may not be returned until the following business day.  We are closed weekends and major holidays. You have access to a nurse at all times for urgent questions. Please call the main number to the clinic [phone] and follow the prompts.  For any non-urgent questions, you may also contact your provider using MyChart. We now offer e-Visits for anyone 66 and older to request care online for non-urgent symptoms. For details visit [email or web address].   Also download the MyChart app! Go to the app store, search "MyChart", open the app, select [facility], and log in with your MyChart username and password.  Masks are optional in the cancer centers. If you would like for your care team to wear a mask while they are taking care of you, please let them know. You may have one support person who is at least 81 years old accompany you for your appointments.

## 2022-07-04 ENCOUNTER — Other Ambulatory Visit: Payer: Self-pay

## 2022-08-08 DIAGNOSIS — R21 Rash and other nonspecific skin eruption: Secondary | ICD-10-CM | POA: Diagnosis not present

## 2022-08-08 DIAGNOSIS — Z85118 Personal history of other malignant neoplasm of bronchus and lung: Secondary | ICD-10-CM | POA: Diagnosis not present

## 2022-08-08 DIAGNOSIS — J449 Chronic obstructive pulmonary disease, unspecified: Secondary | ICD-10-CM | POA: Diagnosis not present

## 2022-08-08 DIAGNOSIS — E538 Deficiency of other specified B group vitamins: Secondary | ICD-10-CM | POA: Diagnosis not present

## 2022-08-08 DIAGNOSIS — E876 Hypokalemia: Secondary | ICD-10-CM | POA: Diagnosis not present

## 2022-08-08 DIAGNOSIS — C3490 Malignant neoplasm of unspecified part of unspecified bronchus or lung: Secondary | ICD-10-CM | POA: Diagnosis not present

## 2022-08-08 DIAGNOSIS — R7309 Other abnormal glucose: Secondary | ICD-10-CM | POA: Diagnosis not present

## 2022-08-08 DIAGNOSIS — F17210 Nicotine dependence, cigarettes, uncomplicated: Secondary | ICD-10-CM | POA: Diagnosis not present

## 2022-08-08 DIAGNOSIS — I10 Essential (primary) hypertension: Secondary | ICD-10-CM | POA: Diagnosis not present

## 2022-08-08 DIAGNOSIS — I700 Atherosclerosis of aorta: Secondary | ICD-10-CM | POA: Diagnosis not present

## 2022-08-21 ENCOUNTER — Other Ambulatory Visit: Payer: Self-pay | Admitting: Hematology & Oncology

## 2022-09-05 DIAGNOSIS — J449 Chronic obstructive pulmonary disease, unspecified: Secondary | ICD-10-CM | POA: Diagnosis not present

## 2022-09-05 DIAGNOSIS — Z85038 Personal history of other malignant neoplasm of large intestine: Secondary | ICD-10-CM | POA: Diagnosis not present

## 2022-09-05 DIAGNOSIS — Z85118 Personal history of other malignant neoplasm of bronchus and lung: Secondary | ICD-10-CM | POA: Diagnosis not present

## 2022-09-05 DIAGNOSIS — I700 Atherosclerosis of aorta: Secondary | ICD-10-CM | POA: Diagnosis not present

## 2022-09-05 DIAGNOSIS — J439 Emphysema, unspecified: Secondary | ICD-10-CM | POA: Diagnosis not present

## 2022-09-05 DIAGNOSIS — R21 Rash and other nonspecific skin eruption: Secondary | ICD-10-CM | POA: Diagnosis not present

## 2022-09-05 DIAGNOSIS — F17210 Nicotine dependence, cigarettes, uncomplicated: Secondary | ICD-10-CM | POA: Diagnosis not present

## 2022-09-05 DIAGNOSIS — I10 Essential (primary) hypertension: Secondary | ICD-10-CM | POA: Diagnosis not present

## 2022-09-11 ENCOUNTER — Telehealth: Payer: Self-pay

## 2022-09-11 DIAGNOSIS — M5416 Radiculopathy, lumbar region: Secondary | ICD-10-CM

## 2022-09-11 NOTE — Telephone Encounter (Signed)
Patient's wife called and stated the patient is having the same type of back pain again. Patient stated the injection lasted until the end of November. No new injuries, falls, or accidents. Please advise

## 2022-09-11 NOTE — Telephone Encounter (Signed)
Spoke with patient and informed him that an order was placed for another injection and I will call to schedule after insurance approves it

## 2022-09-11 NOTE — Addendum Note (Signed)
Addended by: Casilda Carls on: 09/11/2022 10:31 AM   Modules accepted: Orders

## 2022-09-13 ENCOUNTER — Other Ambulatory Visit: Payer: Self-pay

## 2022-09-19 ENCOUNTER — Ambulatory Visit: Payer: Medicare Other | Admitting: Physical Medicine and Rehabilitation

## 2022-09-19 ENCOUNTER — Ambulatory Visit: Payer: Self-pay

## 2022-09-19 VITALS — BP 138/62 | HR 61

## 2022-09-19 DIAGNOSIS — M5416 Radiculopathy, lumbar region: Secondary | ICD-10-CM

## 2022-09-19 MED ORDER — METHYLPREDNISOLONE ACETATE 80 MG/ML IJ SUSP
80.0000 mg | Freq: Once | INTRAMUSCULAR | Status: AC
  Administered 2022-09-19: 80 mg

## 2022-09-19 NOTE — Progress Notes (Signed)
Functional Pain Scale - descriptive words and definitions  Distressing (6)    Pain is present/unable to complete most ADLs limited by pain/sleep is difficult and active distraction is only marginal. Moderate range order  Average Pain  varies, standing and walking makes pain worse   +Driver, -BT, -Dye Allergies.  Lower back pain in the middle that radiates down the right leg to the knee. Radiation started about a week ago. Pain is worse in the morning when standing

## 2022-09-19 NOTE — Patient Instructions (Signed)

## 2022-09-26 NOTE — Progress Notes (Signed)
Jerome Kelley - 82 y.o. male MRN [id number]  Date of birth: [birth year]  Office Visit Note: Visit Date: 09/19/2022 PCP: Collene Leyden, MD Referred by: Collene Leyden, MD  Subjective: Chief Complaint  Patient presents with   Lower Back - Pain   HPI:  Jerome Kelley is a 82 y.o. male who comes in today for planned repeat Right L5-S1  Lumbar Transforaminal epidural steroid injection with fluoroscopic guidance.  The patient has failed conservative care including home exercise, medications, time and activity modification.  This injection will be diagnostic and hopefully therapeutic.  Please see requesting physician notes for further details and justification. Patient received more than 50% pain relief from prior injection.   Referring: Benita Stabile, PA-C   ROS Otherwise per HPI.  Assessment & Plan: Visit Diagnoses:    ICD-10-CM   1. Lumbar radiculopathy  M54.16 XR C-ARM NO REPORT    Epidural Steroid injection    methylPREDNISolone acetate (DEPO-MEDROL) injection 80 mg      Plan: No additional findings.   Meds & Orders:  Meds ordered this encounter  Medications   methylPREDNISolone acetate (DEPO-MEDROL) injection 80 mg    Orders Placed This Encounter  Procedures   XR C-ARM NO REPORT   Epidural Steroid injection    Follow-up: Return for visit to requesting provider as needed.   Procedures: No procedures performed  Lumbosacral Transforaminal Epidural Steroid Injection - Sub-Pedicular Approach with Fluoroscopic Guidance  Patient: Jerome Kelley      Date of Birth: [birth year] MRN: [id number] PCP: Collene Leyden, MD      Visit Date: 09/19/2022   Universal Protocol:    Date/Time: 09/19/2022  Consent Given By: the patient  Position: PRONE  Additional Comments: Vital signs were monitored before and after the procedure. Patient was prepped and draped in the usual sterile fashion. The correct patient, procedure, and site was verified.   Injection Procedure Details:   Procedure  diagnoses: Lumbar radiculopathy [M54.16]    Meds Administered:  Meds ordered this encounter  Medications   methylPREDNISolone acetate (DEPO-MEDROL) injection 80 mg    Laterality: Right  Location/Site: L5  Needle:5.0 in., 22 ga.  Short bevel or Quincke spinal needle  Needle Placement: Transforaminal  Findings:    -Comments: Excellent flow of contrast along the nerve, nerve root and into the epidural space.  Procedure Details: After squaring off the end-plates to get a true AP view, the C-arm was positioned so that an oblique view of the foramen as noted above was visualized. The target area is just inferior to the "nose of the scotty dog" or sub pedicular. The soft tissues overlying this structure were infiltrated with 2-3 ml. of 1% Lidocaine without Epinephrine.  The spinal needle was inserted toward the target using a "trajectory" view along the fluoroscope beam.  Under AP and lateral visualization, the needle was advanced so it did not puncture dura and was located close the 6 O'Clock position of the pedical in AP tracterory. Biplanar projections were used to confirm position. Aspiration was confirmed to be negative for CSF and/or blood. A 1-2 ml. volume of Isovue-250 was injected and flow of contrast was noted at each level. Radiographs were obtained for documentation purposes.   After attaining the desired flow of contrast documented above, a 0.5 to 1.0 ml test dose of 0.25% Marcaine was injected into each respective transforaminal space.  The patient was observed for 90 seconds post injection.  After no sensory deficits were reported, and normal lower extremity motor  function was noted,   the above injectate was administered so that equal amounts of the injectate were placed at each foramen (level) into the transforaminal epidural space.   Additional Comments:  No complications occurred Dressing: 2 x 2 sterile gauze and Band-Aid    Post-procedure details: Patient was observed  during the procedure. Post-procedure instructions were reviewed.  Patient left the clinic in stable condition.    Clinical History: MRI LUMBAR SPINE WITHOUT CONTRAST   TECHNIQUE: Multiplanar, multisequence MR imaging of the lumbar spine was performed. No intravenous contrast was administered.   COMPARISON:  01/01/2019 lumbar spine radiographs   FINDINGS: Segmentation:  Standard.   Alignment:  Physiologic.   Vertebrae:  No fracture, evidence of discitis, or bone lesion.   Conus medullaris and cauda equina: Conus extends to the L2 level. Conus and cauda equina appear normal.   Paraspinal and other soft tissues: Negative.   Disc levels:   T11-12: Normal.   T12-L1: Normal.   L1-2: Normal.   L2-3: Disc space narrowing without spinal canal or neural foraminal stenosis. Normal facets.   L3-4: There is disc space narrowing with mild bulge. No central spinal canal stenosis. Mild right foraminal stenosis.   L4-5: Mild disc bulge with normal facets. No spinal canal stenosis. Mild right foraminal stenosis.   L5-S1: Disc space narrowing with mild bulge. No spinal canal stenosis. Moderate bilateral neural foraminal stenosis.   IMPRESSION: 1. Moderate bilateral L5-S1 neural foraminal stenosis. 2. Mild right foraminal stenosis at L3-4 and L4-5.     Electronically Signed   By: Ulyses Jarred M.D.   On: 01/18/2019 05:27     Objective:  VS:  HT:    WT:   BMI:     BP:138/62  HR:61bpm  TEMP: ( )  RESP:  Physical Exam Vitals and nursing note reviewed.  Constitutional:      General: He is not in acute distress.    Appearance: Normal appearance. He is not ill-appearing.  HENT:     Head: Normocephalic and atraumatic.     Right Ear: External ear normal.     Left Ear: External ear normal.     Nose: No congestion.  Eyes:     Extraocular Movements: Extraocular movements intact.  Cardiovascular:     Rate and Rhythm: Normal rate.     Pulses: Normal pulses.  Pulmonary:      Effort: Pulmonary effort is normal. No respiratory distress.  Abdominal:     General: There is no distension.     Palpations: Abdomen is soft.  Musculoskeletal:        General: No tenderness or signs of injury.     Cervical back: Neck supple.     Right lower leg: No edema.     Left lower leg: No edema.     Comments: Patient has good distal strength without clonus.  Skin:    Findings: No erythema or rash.  Neurological:     General: No focal deficit present.     Mental Status: He is alert and oriented to person, place, and time.     Sensory: No sensory deficit.     Motor: No weakness or abnormal muscle tone.     Coordination: Coordination normal.  Psychiatric:        Mood and Affect: Mood normal.        Behavior: Behavior normal.      Imaging: No results found.

## 2022-09-26 NOTE — Procedures (Signed)
Lumbosacral Transforaminal Epidural Steroid Injection - Sub-Pedicular Approach with Fluoroscopic Guidance  Patient: Jerome Kelley      Date of Birth: Nov 10, 1940 MRN: [id number] PCP: Collene Leyden, MD      Visit Date: 09/19/2022   Universal Protocol:    Date/Time: 09/19/2022  Consent Given By: the patient  Position: PRONE  Additional Comments: Vital signs were monitored before and after the procedure. Patient was prepped and draped in the usual sterile fashion. The correct patient, procedure, and site was verified.   Injection Procedure Details:   Procedure diagnoses: Lumbar radiculopathy [M54.16]    Meds Administered:  Meds ordered this encounter  Medications   methylPREDNISolone acetate (DEPO-MEDROL) injection 80 mg    Laterality: Right  Location/Site: L5  Needle:5.0 in., 22 ga.  Short bevel or Quincke spinal needle  Needle Placement: Transforaminal  Findings:    -Comments: Excellent flow of contrast along the nerve, nerve root and into the epidural space.  Procedure Details: After squaring off the end-plates to get a true AP view, the C-arm was positioned so that an oblique view of the foramen as noted above was visualized. The target area is just inferior to the "nose of the scotty dog" or sub pedicular. The soft tissues overlying this structure were infiltrated with 2-3 ml. of 1% Lidocaine without Epinephrine.  The spinal needle was inserted toward the target using a "trajectory" view along the fluoroscope beam.  Under AP and lateral visualization, the needle was advanced so it did not puncture dura and was located close the 6 O'Clock position of the pedical in AP tracterory. Biplanar projections were used to confirm position. Aspiration was confirmed to be negative for CSF and/or blood. A 1-2 ml. volume of Isovue-250 was injected and flow of contrast was noted at each level. Radiographs were obtained for documentation purposes.   After attaining the desired flow of  contrast documented above, a 0.5 to 1.0 ml test dose of 0.25% Marcaine was injected into each respective transforaminal space.  The patient was observed for 90 seconds post injection.  After no sensory deficits were reported, and normal lower extremity motor function was noted,   the above injectate was administered so that equal amounts of the injectate were placed at each foramen (level) into the transforaminal epidural space.   Additional Comments:  No complications occurred Dressing: 2 x 2 sterile gauze and Band-Aid    Post-procedure details: Patient was observed during the procedure. Post-procedure instructions were reviewed.  Patient left the clinic in stable condition.

## 2022-10-28 ENCOUNTER — Other Ambulatory Visit: Payer: Self-pay | Admitting: Hematology & Oncology

## 2022-10-30 ENCOUNTER — Encounter: Payer: Self-pay | Admitting: Hematology & Oncology

## 2022-12-29 ENCOUNTER — Other Ambulatory Visit: Payer: Self-pay | Admitting: Hematology & Oncology

## 2023-01-03 ENCOUNTER — Telehealth: Payer: Self-pay | Admitting: Physical Medicine and Rehabilitation

## 2023-01-03 DIAGNOSIS — M5416 Radiculopathy, lumbar region: Secondary | ICD-10-CM

## 2023-01-03 NOTE — Telephone Encounter (Signed)
Patient asking for another injection in his back. Please advise

## 2023-01-04 ENCOUNTER — Telehealth: Payer: Self-pay | Admitting: Physical Medicine and Rehabilitation

## 2023-01-04 NOTE — Telephone Encounter (Signed)
See previous encounter

## 2023-01-04 NOTE — Telephone Encounter (Signed)
Patient asking for appointment for an injection in his back. 2nd call

## 2023-01-04 NOTE — Addendum Note (Signed)
Addended by: Sharlet Salina on: 01/04/2023 04:56 PM   Modules accepted: Orders

## 2023-01-04 NOTE — Telephone Encounter (Addendum)
Order for repeat Right L5 TF esi placed

## 2023-01-04 NOTE — Telephone Encounter (Signed)
Spoke with patient's wife and patient is requesting an injection. He states the one on 09/19/22 lasted 1-1.5 months and he had at least 75% relief. No new falls, accidents or injuries. Please advise

## 2023-01-09 ENCOUNTER — Telehealth: Payer: Self-pay | Admitting: Physical Medicine and Rehabilitation

## 2023-01-09 NOTE — Telephone Encounter (Signed)
Spoke with patient and scheduled injection for 01/18/23. Patient aware driver needed

## 2023-01-09 NOTE — Telephone Encounter (Signed)
Received call from patient's wife [name] asking if an approval was received for the epidural injection for her husband? The number to contact [name] is [phone]

## 2023-01-11 ENCOUNTER — Other Ambulatory Visit: Payer: Self-pay

## 2023-01-18 ENCOUNTER — Ambulatory Visit: Payer: Medicare Other | Admitting: Physical Medicine and Rehabilitation

## 2023-01-18 ENCOUNTER — Other Ambulatory Visit: Payer: Self-pay

## 2023-01-18 VITALS — BP 98/57 | HR 75

## 2023-01-18 DIAGNOSIS — M5416 Radiculopathy, lumbar region: Secondary | ICD-10-CM | POA: Diagnosis not present

## 2023-01-18 MED ORDER — METHYLPREDNISOLONE ACETATE 80 MG/ML IJ SUSP
80.0000 mg | Freq: Once | INTRAMUSCULAR | Status: AC
Start: 2023-01-18 — End: 2023-01-18
  Administered 2023-01-18: 80 mg

## 2023-01-18 NOTE — Progress Notes (Signed)
Functional Pain Scale - descriptive words and definitions  Moderate (4)   Constantly aware of pain, can complete ADLs with modification/sleep marginally affected at times/passive distraction is of no use, but active distraction gives some relief. Moderate range order  Average Pain 3-4   +Driver, -BT, -Dye Allergies.  Lower back pain on right side that can radiate into right leg. Walking, standing, and bending can make pain worse

## 2023-01-18 NOTE — Procedures (Signed)
Lumbosacral Transforaminal Epidural Steroid Injection - Sub-Pedicular Approach with Fluoroscopic Guidance  Patient: Jerome Kelley      Date of Birth: 07-Sep-1940 MRN: [id number] PCP: Irven Coe, MD      Visit Date: 01/18/2023   Universal Protocol:    Date/Time: 01/18/2023  Consent Given By: the patient  Position: PRONE  Additional Comments: Vital signs were monitored before and after the procedure. Patient was prepped and draped in the usual sterile fashion. The correct patient, procedure, and site was verified.   Injection Procedure Details:   Procedure diagnoses: Lumbar radiculopathy [M54.16]    Meds Administered:  Meds ordered this encounter  Medications   methylPREDNISolone acetate (DEPO-MEDROL) injection 80 mg    Laterality: Right  Location/Site: L5  Needle:5.0 in., 22 ga.  Short bevel or Quincke spinal needle  Needle Placement: Transforaminal  Findings:    -Comments: Excellent flow of contrast along the nerve, nerve root and into the epidural space.  Procedure Details: After squaring off the end-plates to get a true AP view, the C-arm was positioned so that an oblique view of the foramen as noted above was visualized. The target area is just inferior to the "nose of the scotty dog" or sub pedicular. The soft tissues overlying this structure were infiltrated with 2-3 ml. of 1% Lidocaine without Epinephrine.  The spinal needle was inserted toward the target using a "trajectory" view along the fluoroscope beam.  Under AP and lateral visualization, the needle was advanced so it did not puncture dura and was located close the 6 O'Clock position of the pedical in AP tracterory. Biplanar projections were used to confirm position. Aspiration was confirmed to be negative for CSF and/or blood. A 1-2 ml. volume of Isovue-250 was injected and flow of contrast was noted at each level. Radiographs were obtained for documentation purposes.   After attaining the desired flow of  contrast documented above, a 0.5 to 1.0 ml test dose of 0.25% Marcaine was injected into each respective transforaminal space.  The patient was observed for 90 seconds post injection.  After no sensory deficits were reported, and normal lower extremity motor function was noted,   the above injectate was administered so that equal amounts of the injectate were placed at each foramen (level) into the transforaminal epidural space.   Additional Comments:  No complications occurred Dressing: 2 x 2 sterile gauze and Band-Aid    Post-procedure details: Patient was observed during the procedure. Post-procedure instructions were reviewed.  Patient left the clinic in stable condition.

## 2023-01-18 NOTE — Patient Instructions (Signed)

## 2023-01-18 NOTE — Progress Notes (Signed)
Jerome Kelley - 82 y.o. male MRN [id number]  Date of birth: Apr 05, 1941  Office Visit Note: Visit Date: 01/18/2023 PCP: Irven Coe, MD Referred by: Irven Coe, MD  Subjective: Chief Complaint  Patient presents with   Lower Back - Pain   HPI:  Jerome Kelley is a 82 y.o. male who comes in today for planned repeat Right L5-S1  Lumbar Transforaminal epidural steroid injection with fluoroscopic guidance.  The patient has failed conservative care including home exercise, medications, time and activity modification.  This injection will be diagnostic and hopefully therapeutic.  Please see requesting physician notes for further details and justification. Patient received more than 100% pain relief from prior injection.   Referring: Ellin Goodie, FNP and Rexene Edison, PA-C   ROS Otherwise per HPI.  Assessment & Plan: Visit Diagnoses:    ICD-10-CM   1. Lumbar radiculopathy  M54.16 XR C-ARM NO REPORT    Epidural Steroid injection    methylPREDNISolone acetate (DEPO-MEDROL) injection 80 mg      Plan: No additional findings.   Meds & Orders:  Meds ordered this encounter  Medications   methylPREDNISolone acetate (DEPO-MEDROL) injection 80 mg    Orders Placed This Encounter  Procedures   XR C-ARM NO REPORT   Epidural Steroid injection    Follow-up: Return for visit to requesting provider as needed.   Procedures: No procedures performed  Lumbosacral Transforaminal Epidural Steroid Injection - Sub-Pedicular Approach with Fluoroscopic Guidance  Patient: Jerome Kelley      Date of Birth: 1941-06-19 MRN: [id number] PCP: Irven Coe, MD      Visit Date: 01/18/2023   Universal Protocol:    Date/Time: 01/18/2023  Consent Given By: the patient  Position: PRONE  Additional Comments: Vital signs were monitored before and after the procedure. Patient was prepped and draped in the usual sterile fashion. The correct patient, procedure, and site was verified.   Injection Procedure  Details:   Procedure diagnoses: Lumbar radiculopathy [M54.16]    Meds Administered:  Meds ordered this encounter  Medications   methylPREDNISolone acetate (DEPO-MEDROL) injection 80 mg    Laterality: Right  Location/Site: L5  Needle:5.0 in., 22 ga.  Short bevel or Quincke spinal needle  Needle Placement: Transforaminal  Findings:    -Comments: Excellent flow of contrast along the nerve, nerve root and into the epidural space.  Procedure Details: After squaring off the end-plates to get a true AP view, the C-arm was positioned so that an oblique view of the foramen as noted above was visualized. The target area is just inferior to the "nose of the scotty dog" or sub pedicular. The soft tissues overlying this structure were infiltrated with 2-3 ml. of 1% Lidocaine without Epinephrine.  The spinal needle was inserted toward the target using a "trajectory" view along the fluoroscope beam.  Under AP and lateral visualization, the needle was advanced so it did not puncture dura and was located close the 6 O'Clock position of the pedical in AP tracterory. Biplanar projections were used to confirm position. Aspiration was confirmed to be negative for CSF and/or blood. A 1-2 ml. volume of Isovue-250 was injected and flow of contrast was noted at each level. Radiographs were obtained for documentation purposes.   After attaining the desired flow of contrast documented above, a 0.5 to 1.0 ml test dose of 0.25% Marcaine was injected into each respective transforaminal space.  The patient was observed for 90 seconds post injection.  After no sensory deficits were reported, and  normal lower extremity motor function was noted,   the above injectate was administered so that equal amounts of the injectate were placed at each foramen (level) into the transforaminal epidural space.   Additional Comments:  No complications occurred Dressing: 2 x 2 sterile gauze and Band-Aid    Post-procedure  details: Patient was observed during the procedure. Post-procedure instructions were reviewed.  Patient left the clinic in stable condition.    Clinical History: MRI LUMBAR SPINE WITHOUT CONTRAST   TECHNIQUE: Multiplanar, multisequence MR imaging of the lumbar spine was performed. No intravenous contrast was administered.   COMPARISON:  01/01/2019 lumbar spine radiographs   FINDINGS: Segmentation:  Standard.   Alignment:  Physiologic.   Vertebrae:  No fracture, evidence of discitis, or bone lesion.   Conus medullaris and cauda equina: Conus extends to the L2 level. Conus and cauda equina appear normal.   Paraspinal and other soft tissues: Negative.   Disc levels:   T11-12: Normal.   T12-L1: Normal.   L1-2: Normal.   L2-3: Disc space narrowing without spinal canal or neural foraminal stenosis. Normal facets.   L3-4: There is disc space narrowing with mild bulge. No central spinal canal stenosis. Mild right foraminal stenosis.   L4-5: Mild disc bulge with normal facets. No spinal canal stenosis. Mild right foraminal stenosis.   L5-S1: Disc space narrowing with mild bulge. No spinal canal stenosis. Moderate bilateral neural foraminal stenosis.   IMPRESSION: 1. Moderate bilateral L5-S1 neural foraminal stenosis. 2. Mild right foraminal stenosis at L3-4 and L4-5.     Electronically Signed   By: Deatra Robinson M.D.   On: 01/18/2019 05:27     Objective:  VS:  HT:    WT:   BMI:     BP:(!) 98/57  HR:75bpm  TEMP: ( )  RESP:  Physical Exam Vitals and nursing note reviewed.  Constitutional:      General: He is not in acute distress.    Appearance: Normal appearance. He is not ill-appearing.  HENT:     Head: Normocephalic and atraumatic.     Right Ear: External ear normal.     Left Ear: External ear normal.     Nose: No congestion.  Eyes:     Extraocular Movements: Extraocular movements intact.  Cardiovascular:     Rate and Rhythm: Normal rate.      Pulses: Normal pulses.  Pulmonary:     Effort: Pulmonary effort is normal. No respiratory distress.  Abdominal:     General: There is no distension.     Palpations: Abdomen is soft.  Musculoskeletal:        General: No tenderness or signs of injury.     Cervical back: Neck supple.     Right lower leg: No edema.     Left lower leg: No edema.     Comments: Patient has good distal strength without clonus.  Skin:    Findings: No erythema or rash.  Neurological:     General: No focal deficit present.     Mental Status: He is alert and oriented to person, place, and time.     Sensory: No sensory deficit.     Motor: No weakness or abnormal muscle tone.     Coordination: Coordination normal.  Psychiatric:        Mood and Affect: Mood normal.        Behavior: Behavior normal.      Imaging: No results found.

## 2023-01-29 DIAGNOSIS — H52203 Unspecified astigmatism, bilateral: Secondary | ICD-10-CM | POA: Diagnosis not present

## 2023-01-29 DIAGNOSIS — H3500 Unspecified background retinopathy: Secondary | ICD-10-CM | POA: Diagnosis not present

## 2023-01-29 DIAGNOSIS — Z961 Presence of intraocular lens: Secondary | ICD-10-CM | POA: Diagnosis not present

## 2023-01-29 DIAGNOSIS — H18513 Endothelial corneal dystrophy, bilateral: Secondary | ICD-10-CM | POA: Diagnosis not present

## 2023-02-12 DIAGNOSIS — E538 Deficiency of other specified B group vitamins: Secondary | ICD-10-CM | POA: Diagnosis not present

## 2023-02-12 DIAGNOSIS — Z85038 Personal history of other malignant neoplasm of large intestine: Secondary | ICD-10-CM | POA: Diagnosis not present

## 2023-02-12 DIAGNOSIS — R972 Elevated prostate specific antigen [PSA]: Secondary | ICD-10-CM | POA: Diagnosis not present

## 2023-02-12 DIAGNOSIS — I700 Atherosclerosis of aorta: Secondary | ICD-10-CM | POA: Diagnosis not present

## 2023-02-12 DIAGNOSIS — Z0000 Encounter for general adult medical examination without abnormal findings: Secondary | ICD-10-CM | POA: Diagnosis not present

## 2023-02-12 DIAGNOSIS — M5416 Radiculopathy, lumbar region: Secondary | ICD-10-CM | POA: Diagnosis not present

## 2023-02-12 DIAGNOSIS — C3490 Malignant neoplasm of unspecified part of unspecified bronchus or lung: Secondary | ICD-10-CM | POA: Diagnosis not present

## 2023-02-12 DIAGNOSIS — J449 Chronic obstructive pulmonary disease, unspecified: Secondary | ICD-10-CM | POA: Diagnosis not present

## 2023-02-12 DIAGNOSIS — Z85118 Personal history of other malignant neoplasm of bronchus and lung: Secondary | ICD-10-CM | POA: Diagnosis not present

## 2023-02-12 DIAGNOSIS — F17210 Nicotine dependence, cigarettes, uncomplicated: Secondary | ICD-10-CM | POA: Diagnosis not present

## 2023-02-12 DIAGNOSIS — I10 Essential (primary) hypertension: Secondary | ICD-10-CM | POA: Diagnosis not present

## 2023-02-12 DIAGNOSIS — J439 Emphysema, unspecified: Secondary | ICD-10-CM | POA: Diagnosis not present

## 2023-02-14 ENCOUNTER — Other Ambulatory Visit: Payer: Self-pay

## 2023-02-14 ENCOUNTER — Inpatient Hospital Stay (HOSPITAL_BASED_OUTPATIENT_CLINIC_OR_DEPARTMENT_OTHER): Payer: Medicare Other | Admitting: Hematology & Oncology

## 2023-02-14 ENCOUNTER — Telehealth: Payer: Self-pay | Admitting: Hematology & Oncology

## 2023-02-14 ENCOUNTER — Inpatient Hospital Stay: Payer: Medicare Other | Attending: Hematology & Oncology

## 2023-02-14 ENCOUNTER — Encounter: Payer: Self-pay | Admitting: Hematology & Oncology

## 2023-02-14 ENCOUNTER — Inpatient Hospital Stay: Payer: Medicare Other

## 2023-02-14 ENCOUNTER — Other Ambulatory Visit: Payer: Self-pay | Admitting: *Deleted

## 2023-02-14 VITALS — BP 146/41 | HR 68 | Resp 17

## 2023-02-14 VITALS — BP 123/75 | HR 79 | Temp 97.6°F | Resp 18 | Ht 68.0 in | Wt 146.0 lb

## 2023-02-14 DIAGNOSIS — Z79899 Other long term (current) drug therapy: Secondary | ICD-10-CM | POA: Diagnosis not present

## 2023-02-14 DIAGNOSIS — C342 Malignant neoplasm of middle lobe, bronchus or lung: Secondary | ICD-10-CM | POA: Insufficient documentation

## 2023-02-14 DIAGNOSIS — C772 Secondary and unspecified malignant neoplasm of intra-abdominal lymph nodes: Secondary | ICD-10-CM | POA: Insufficient documentation

## 2023-02-14 DIAGNOSIS — C3411 Malignant neoplasm of upper lobe, right bronchus or lung: Secondary | ICD-10-CM

## 2023-02-14 DIAGNOSIS — Z5112 Encounter for antineoplastic immunotherapy: Secondary | ICD-10-CM | POA: Insufficient documentation

## 2023-02-14 DIAGNOSIS — Z95828 Presence of other vascular implants and grafts: Secondary | ICD-10-CM

## 2023-02-14 DIAGNOSIS — E039 Hypothyroidism, unspecified: Secondary | ICD-10-CM

## 2023-02-14 DIAGNOSIS — E038 Other specified hypothyroidism: Secondary | ICD-10-CM

## 2023-02-14 LAB — CMP (CANCER CENTER ONLY)
ALT: 6 U/L (ref 0–44)
AST: 9 U/L — ABNORMAL LOW (ref 15–41)
Albumin: 3.3 g/dL — ABNORMAL LOW (ref 3.5–5.0)
Alkaline Phosphatase: 112 U/L (ref 38–126)
Anion gap: 7 (ref 5–15)
BUN: 12 mg/dL (ref 8–23)
CO2: 25 mmol/L (ref 22–32)
Calcium: 8.6 mg/dL — ABNORMAL LOW (ref 8.9–10.3)
Chloride: 108 mmol/L (ref 98–111)
Creatinine: 0.77 mg/dL (ref 0.61–1.24)
GFR, Estimated: >60 mL/min (ref >60)
Glucose, Bld: 123 mg/dL — ABNORMAL HIGH (ref 70–99)
Potassium: 3.6 mmol/L (ref 3.5–5.1)
Sodium: 140 mmol/L (ref 135–145)
Total Bilirubin: 0.9 mg/dL (ref 0.3–1.2)
Total Protein: 5.9 g/dL — ABNORMAL LOW (ref 6.5–8.1)

## 2023-02-14 LAB — CBC WITH DIFFERENTIAL (CANCER CENTER ONLY)
Abs Immature Granulocytes: 0.01 10*3/uL (ref 0.00–0.07)
Basophils Absolute: 0 10*3/uL (ref 0.0–0.1)
Basophils Relative: 1 %
Eosinophils Absolute: 0.1 10*3/uL (ref 0.0–0.5)
Eosinophils Relative: 1 %
HCT: 33.4 % — ABNORMAL LOW (ref 39.0–52.0)
Hemoglobin: 11.1 g/dL — ABNORMAL LOW (ref 13.0–17.0)
Immature Granulocytes: 0 %
Lymphocytes Relative: 20 %
Lymphs Abs: 0.7 10*3/uL (ref 0.7–4.0)
MCH: 32.3 pg (ref 26.0–34.0)
MCHC: 33.2 g/dL (ref 30.0–36.0)
MCV: 97.1 fL (ref 80.0–100.0)
Monocytes Absolute: 0.4 10*3/uL (ref 0.1–1.0)
Monocytes Relative: 11 %
Neutro Abs: 2.4 10*3/uL (ref 1.7–7.7)
Neutrophils Relative %: 67 %
Platelet Count: 82 10*3/uL — ABNORMAL LOW (ref 150–400)
RBC: 3.44 MIL/uL — ABNORMAL LOW (ref 4.22–5.81)
RDW: 15.2 % (ref 11.5–15.5)
WBC Count: 3.5 10*3/uL — ABNORMAL LOW (ref 4.0–10.5)
nRBC: 0 % (ref 0.0–0.2)

## 2023-02-14 LAB — LACTATE DEHYDROGENASE: LDH: 134 U/L (ref 98–192)

## 2023-02-14 LAB — TSH: TSH: 2.958 u[IU]/mL (ref 0.350–4.500)

## 2023-02-14 MED ORDER — SODIUM CHLORIDE 0.9 % IV SOLN
480.0000 mg | Freq: Once | INTRAVENOUS | Status: AC
  Administered 2023-02-14: 480 mg via INTRAVENOUS
  Filled 2023-02-14: qty 48

## 2023-02-14 MED ORDER — HEPARIN SOD (PORK) LOCK FLUSH 100 UNIT/ML IV SOLN: 500.0000 [IU] | Freq: Once | INTRAVENOUS | Status: DC | PRN

## 2023-02-14 MED ORDER — SODIUM CHLORIDE 0.9% FLUSH
10.0000 mL | INTRAVENOUS | Status: DC | PRN
  Administered 2023-02-14: 10 mL

## 2023-02-14 MED ORDER — HEPARIN SOD (PORK) LOCK FLUSH 100 UNIT/ML IV SOLN
500.0000 [IU] | Freq: Once | INTRAVENOUS | Status: AC | PRN
  Administered 2023-02-14: 500 [IU]

## 2023-02-14 MED ORDER — SODIUM CHLORIDE 0.9 % IV SOLN: Freq: Once | INTRAVENOUS | Status: AC

## 2023-02-14 NOTE — Telephone Encounter (Signed)
Called and LVM to schedule pt for today per Dr Myna Hidalgo. Left message on both home and cell phone requesting callback.

## 2023-02-14 NOTE — Progress Notes (Signed)
Hematology and Oncology Follow Up Visit  Jerome Kelley [id number] [birth year] 82 y.o. 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 History of stage Ia (T1aN0M0) adenocarcinoma the right lung - resected in March 2011 History of stage II adenocarcinoma of the colon-high risk - April 2010 Metastatic adenocarcinoma of the lung --retroperitoneal nodal metastasis and new left lung nodule  --high PD-L1   Current Therapy:        Carboplatin/Alimta/Pembrolizumab - started 05/10/2020- s/p cycle 6 Radiation with weekly carboplatin/Taxol-start on 11/01/2020 -- completed on 12/20/2020 Yervoy/Opdivo --s/p cycle #4  -- start on 09/15/2021 Nivolumab -- maintenance -- s/p cycle #6 -- start on 01/17/2022    Interim History:  Jerome Kelley is here today for follow-up.  Unfortunately, we have not seen him for a good 6 months.  I am not too sure as to how he got dropped off the schedule.  Thankfully, his family doctor called [place] yesterday saying that he had not been seen.  He has lost a little bit of weight.  Hopefully, this is not from malignancy.  He is still smoking.  I have to believe that some of the weight loss could certainly be from underlying COPD.  He has chronic lower back pain.  He does get injections-epidural.  He has had no cough.  Is been no increased shortness of breath.  He has had no hemoptysis.  Since we last saw him, he has not had any problems with COVID.  He has had no problems with his appetite.  He is eating quite well.  He is having no nausea or vomiting.  There is no dysphagia or odynophagia.  He has had no headache.  Overall, I would have to say that his performance status is probably ECOG 1.   Medications:  Allergies as of 02/14/2023   No Known Allergies      Medication List        Accurate as of February 14, 2023  3:10 PM. If you have any questions, ask your nurse or doctor.          STOP taking these medications     benazepril 20 MG tablet Commonly known as: LOTENSIN Stopped by: Josph Macho, MD   ketoconazole 2 % cream Commonly known as: NIZORAL Stopped by: Josph Macho, MD   Varenicline Tartrate (Starter) 0.5 MG X 11 & 1 MG X 42 Tbpk Stopped by: Josph Macho, MD       TAKE these medications    aspirin 81 MG chewable tablet Chew 1 tablet (81 mg total) by mouth 2 (two) times daily. What changed: when to take this   cyanocobalamin 1000 MCG tablet Commonly known as: VITAMIN B12 Take 1 tablet (1,000 mcg total) by mouth daily.   lidocaine-prilocaine cream Commonly known as: EMLA Apply 1 application. topically as needed. Apply 1 hour prior to procedure.   LORazepam 0.5 MG tablet Commonly known as: ATIVAN TAKE 1 TABLET BY MOUTH EVERY 8 HOURS AS NEEDED FOR ANXIETY   Stool Softener 100 MG capsule Generic drug: docusate sodium Take 100 mg by mouth daily as needed for mild constipation.   tiZANidine 2 MG tablet Commonly known as: ZANAFLEX 1-2 tablets Orally every 8 hrs as needed for muscle spasm   triamcinolone cream 0.1 % Commonly known as: KENALOG Apply 1 application. topically 2 (two) times daily.        Allergies: No Known Allergies  Past Medical History, Surgical history, Social history,  and Family History were reviewed and updated.  Review of Systems: Review of Systems  Constitutional: Negative.   HENT: Negative.    Eyes: Negative.   Respiratory: Negative.    Cardiovascular: Negative.   Gastrointestinal: Negative.   Genitourinary: Negative.   Musculoskeletal: Negative.   Skin: Negative.   Neurological:  Positive for dizziness.  Endo/Heme/Allergies: Negative.   Psychiatric/Behavioral: Negative.       Physical Exam:  height is 5\' 8"  (1.727 m) and weight is 146 lb (66.2 kg). His oral temperature is 97.6 F (36.4 C). His blood pressure is 123/75 and his pulse is 79. His respiration is 18 and oxygen saturation is 100%.   Wt Readings from Last 3  Encounters:  02/14/23 146 lb (66.2 kg)  07/03/22 155 lb (70.3 kg)  06/07/22 155 lb (70.3 kg)    Physical Exam Vitals reviewed.  HENT:     Head: Normocephalic and atraumatic.  Eyes:     Pupils: Pupils are equal, round, and reactive to light.  Cardiovascular:     Rate and Rhythm: Normal rate and regular rhythm.     Heart sounds: Normal heart sounds.  Pulmonary:     Effort: Pulmonary effort is normal.     Breath sounds: Normal breath sounds.  Abdominal:     General: Bowel sounds are normal.     Palpations: Abdomen is soft.  Musculoskeletal:        General: No tenderness or deformity. Normal range of motion.     Cervical back: Normal range of motion.  Lymphadenopathy:     Cervical: No cervical adenopathy.  Skin:    General: Skin is warm and dry.     Findings: No erythema or rash.  Neurological:     Mental Status: He is alert and oriented to person, place, and time.  Psychiatric:        Behavior: Behavior normal.        Thought Content: Thought content normal.        Judgment: Judgment normal.     Lab Results  Component Value Date   WBC 3.5 (L) 02/14/2023   HGB 11.1 (L) 02/14/2023   HCT 33.4 (L) 02/14/2023   MCV 97.1 02/14/2023   PLT 82 (L) 02/14/2023   Lab Results  Component Value Date   FERRITIN 73 04/12/2022   IRON 75 04/12/2022   TIBC 273 04/12/2022   UIBC 198 04/12/2022   IRONPCTSAT 28 04/12/2022   Lab Results  Component Value Date   RETICCTPCT 3.6 (H) 09/07/2021   RBC 3.44 (L) 02/14/2023   No results found for: "KPAFRELGTCHN", "LAMBDASER", "KAPLAMBRATIO" No results found for: "IGGSERUM", "IGA", "IGMSERUM" No results found for: "TOTALPROTELP", "ALBUMINELP", "A1GS", "A2GS", "BETS", "BETA2SER", "GAMS", "MSPIKE", "SPEI"   Chemistry      Component Value Date/Time   NA 140 02/14/2023 1415   NA 140 06/28/2011 0950   K 3.6 02/14/2023 1415   K 4.7 06/28/2011 0950   CL 108 02/14/2023 1415   CL 100 06/28/2011 0950   CO2 25 02/14/2023 1415   CO2 29  06/28/2011 0950   BUN 12 02/14/2023 1415   BUN 10 06/28/2011 0950   CREATININE 0.77 02/14/2023 1415   CREATININE 0.7 06/28/2011 0950      Component Value Date/Time   CALCIUM 8.6 (L) 02/14/2023 1415   CALCIUM 9.0 06/28/2011 0950   ALKPHOS 112 02/14/2023 1415   ALKPHOS 110 (H) 12/22/2010 0850   AST 9 (L) 02/14/2023 1415   ALT 6 02/14/2023 1415   ALT  18 12/22/2010 0850   BILITOT 0.9 02/14/2023 1415       Impression and Plan: Mr. Bartnik is a very pleasant 82 yo caucasian gentleman with previous history of right lung cancer as well as colon cancer 10 years ago. He now has stage IIIb adenocarcinoma of the lung, right middle lobe.   We initially treated him with systemic chemotherapy.  He was not a candidate for surgery.  He was not a candidate for curative chemoradiation therapy together.  I really hope that he does not have progressive disease.  Again, we have to get a PET scan on him so we can see how everything looks.  Again I know he has responded in the past to his immunotherapy.  Hopefully, that response as long lasting.  Hopefully, his weight will come back up.  Again I suspect that some of this might be from the underlying COPD.  I will plan to get a PET scan on him in a couple weeks.  Will see him back ourselves in 1 month.   Josph Macho, MD 7/3/20243:10 PM

## 2023-02-14 NOTE — Patient Instructions (Signed)
Nivolumab Injection What is this medication? NIVOLUMAB (nye VOL ue mab) treats some types of cancer. It works by helping your immune system slow or stop the spread of cancer cells. It is a monoclonal antibody. This medicine may be used for other purposes; ask your health care provider or pharmacist if you have questions. COMMON BRAND NAME(S): Opdivo What should I tell my care team before I take this medication? They need to know if you have any of these conditions: Allogeneic stem cell transplant (uses someone else's stem cells) Autoimmune diseases, such as Crohn disease, ulcerative colitis, lupus History of chest radiation Nervous system problems, such as Guillain-Barre syndrome or myasthenia gravis Organ transplant An unusual or allergic reaction to nivolumab, other medications, foods, dyes, or preservatives Pregnant or trying to get pregnant Breast-feeding How should I use this medication? This medication is infused into a vein. It is given in a hospital or clinic setting. A special MedGuide will be given to you before each treatment. Be sure to read this information carefully each time. Talk to your care team about the use of this medication in children. While it may be prescribed for children as young as 12 years for selected conditions, precautions do apply. Overdosage: If you think you have taken too much of this medicine contact a poison control center or emergency room at once. NOTE: This medicine is only for you. Do not share this medicine with others. What if I miss a dose? Keep appointments for follow-up doses. It is important not to miss your dose. Call your care team if you are unable to keep an appointment. What may interact with this medication? Interactions have not been studied. This list may not describe all possible interactions. Give your health care provider a list of all the medicines, herbs, non-prescription drugs, or dietary supplements you use. Also tell them if you  smoke, drink alcohol, or use illegal drugs. Some items may interact with your medicine. What should I watch for while using this medication? Your condition will be monitored carefully while you are receiving this medication. You may need blood work while taking this medication. This medication may cause serious skin reactions. They can happen weeks to months after starting the medication. Contact your care team right away if you notice fevers or flu-like symptoms with a rash. The rash may be red or purple and then turn into blisters or peeling of the skin. You may also notice a red rash with swelling of the face, lips, or lymph nodes in your neck or under your arms. Tell your care team right away if you have any change in your eyesight. Talk to your care team if you are pregnant or think you might be pregnant. A negative pregnancy test is required before starting this medication. A reliable form of contraception is recommended while taking this medication and for 5 months after the last dose. Talk to your care team about effective forms of contraception. Do not breast-feed while taking this medication and for 5 months after the last dose. What side effects may I notice from receiving this medication? Side effects that you should report to your care team as soon as possible: Allergic reactions--skin rash, itching, hives, swelling of the face, lips, tongue, or throat Dry cough, shortness of breath or trouble breathing Eye pain, redness, irritation, or discharge with blurry or decreased vision Heart muscle inflammation--unusual weakness or fatigue, shortness of breath, chest pain, fast or irregular heartbeat, dizziness, swelling of the ankles, feet, or hands Hormone   gland problems--headache, sensitivity to light, unusual weakness or fatigue, dizziness, fast or irregular heartbeat, increased sensitivity to cold or heat, excessive sweating, constipation, hair loss, increased thirst or amount of urine,  tremors or shaking, irritability Infusion reactions--chest pain, shortness of breath or trouble breathing, feeling faint or lightheaded Kidney injury (glomerulonephritis)--decrease in the amount of urine, red or dark brown urine, foamy or bubbly urine, swelling of the ankles, hands, or feet Liver injury--right upper belly pain, loss of appetite, nausea, light-colored stool, dark yellow or brown urine, yellowing skin or eyes, unusual weakness or fatigue Pain, tingling, or numbness in the hands or feet, muscle weakness, change in vision, confusion or trouble speaking, loss of balance or coordination, trouble walking, seizures Rash, fever, and swollen lymph nodes Redness, blistering, peeling, or loosening of the skin, including inside the mouth Sudden or severe stomach pain, bloody diarrhea, fever, nausea, vomiting Side effects that usually do not require medical attention (report these to your care team if they continue or are bothersome): Bone, joint, or muscle pain Diarrhea Fatigue Loss of appetite Nausea Skin rash This list may not describe all possible side effects. Call your doctor for medical advice about side effects. You may report side effects to FDA at 1-800-FDA-1088. Where should I keep my medication? This medication is given in a hospital or clinic. It will not be stored at home. NOTE: This sheet is a summary. It may not cover all possible information. If you have questions about this medicine, talk to your doctor, pharmacist, or health care provider.  2024 Elsevier/Gold Standard (2021-11-28 00:00:00)  

## 2023-02-14 NOTE — Progress Notes (Signed)
Reviewed labs with Dr. Myna Hidalgo and pt ok to treat with platelets 82

## 2023-02-14 NOTE — Patient Instructions (Signed)

## 2023-02-15 ENCOUNTER — Other Ambulatory Visit: Payer: Self-pay

## 2023-02-16 LAB — T4: T4, Total: 7 ug/dL (ref 4.5–12.0)

## 2023-02-17 ENCOUNTER — Other Ambulatory Visit: Payer: Self-pay

## 2023-02-20 ENCOUNTER — Other Ambulatory Visit: Payer: Self-pay | Admitting: Hematology & Oncology

## 2023-02-20 DIAGNOSIS — C3411 Malignant neoplasm of upper lobe, right bronchus or lung: Secondary | ICD-10-CM

## 2023-02-21 ENCOUNTER — Other Ambulatory Visit: Payer: Self-pay

## 2023-03-01 ENCOUNTER — Other Ambulatory Visit: Payer: Self-pay | Admitting: Hematology & Oncology

## 2023-03-06 ENCOUNTER — Ambulatory Visit (HOSPITAL_COMMUNITY)
Admission: RE | Admit: 2023-03-06 | Discharge: 2023-03-06 | Disposition: A | Discharge status: Home / Self Care | Payer: Medicare Other | Source: Ambulatory Visit | Attending: Hematology & Oncology | Admitting: Hematology & Oncology

## 2023-03-06 DIAGNOSIS — C3411 Malignant neoplasm of upper lobe, right bronchus or lung: Secondary | ICD-10-CM | POA: Insufficient documentation

## 2023-03-06 DIAGNOSIS — C3490 Malignant neoplasm of unspecified part of unspecified bronchus or lung: Secondary | ICD-10-CM | POA: Diagnosis not present

## 2023-03-06 LAB — GLUCOSE, CAPILLARY: Glucose-Capillary: 86 mg/dL (ref 70–99)

## 2023-03-06 MED ORDER — FLUDEOXYGLUCOSE F - 18 (FDG) INJECTION
7.3000 | Freq: Once | INTRAVENOUS | Status: AC
  Administered 2023-03-06: 7.24 via INTRAVENOUS

## 2023-03-14 ENCOUNTER — Inpatient Hospital Stay (HOSPITAL_BASED_OUTPATIENT_CLINIC_OR_DEPARTMENT_OTHER): Payer: Medicare Other | Admitting: Hematology & Oncology

## 2023-03-14 ENCOUNTER — Inpatient Hospital Stay: Payer: Medicare Other

## 2023-03-14 ENCOUNTER — Encounter: Payer: Self-pay | Admitting: Hematology & Oncology

## 2023-03-14 VITALS — BP 114/57 | HR 85 | Temp 97.9°F | Resp 18 | Wt 146.0 lb

## 2023-03-14 DIAGNOSIS — C3411 Malignant neoplasm of upper lobe, right bronchus or lung: Secondary | ICD-10-CM

## 2023-03-14 DIAGNOSIS — C342 Malignant neoplasm of middle lobe, bronchus or lung: Secondary | ICD-10-CM | POA: Diagnosis not present

## 2023-03-14 DIAGNOSIS — I10 Essential (primary) hypertension: Secondary | ICD-10-CM | POA: Diagnosis not present

## 2023-03-14 DIAGNOSIS — E039 Hypothyroidism, unspecified: Secondary | ICD-10-CM

## 2023-03-14 DIAGNOSIS — Z79899 Other long term (current) drug therapy: Secondary | ICD-10-CM | POA: Diagnosis not present

## 2023-03-14 DIAGNOSIS — C772 Secondary and unspecified malignant neoplasm of intra-abdominal lymph nodes: Secondary | ICD-10-CM | POA: Diagnosis not present

## 2023-03-14 DIAGNOSIS — Z5112 Encounter for antineoplastic immunotherapy: Secondary | ICD-10-CM | POA: Diagnosis not present

## 2023-03-14 LAB — TSH: TSH: 3.497 u[IU]/mL (ref 0.350–4.500)

## 2023-03-14 LAB — CMP (CANCER CENTER ONLY)
ALT: 5 U/L (ref 0–44)
AST: 10 U/L — ABNORMAL LOW (ref 15–41)
Albumin: 3.1 g/dL — ABNORMAL LOW (ref 3.5–5.0)
Alkaline Phosphatase: 110 U/L (ref 38–126)
Anion gap: 7 (ref 5–15)
BUN: 12 mg/dL (ref 8–23)
CO2: 25 mmol/L (ref 22–32)
Calcium: 8.4 mg/dL — ABNORMAL LOW (ref 8.9–10.3)
Chloride: 109 mmol/L (ref 98–111)
Creatinine: 0.78 mg/dL (ref 0.61–1.24)
GFR, Estimated: >60 mL/min (ref >60)
Glucose, Bld: 117 mg/dL — ABNORMAL HIGH (ref 70–99)
Potassium: 3.5 mmol/L (ref 3.5–5.1)
Sodium: 141 mmol/L (ref 135–145)
Total Bilirubin: 0.9 mg/dL (ref 0.3–1.2)
Total Protein: 5.7 g/dL — ABNORMAL LOW (ref 6.5–8.1)

## 2023-03-14 LAB — LACTATE DEHYDROGENASE: LDH: 161 U/L (ref 98–192)

## 2023-03-14 LAB — CBC WITH DIFFERENTIAL (CANCER CENTER ONLY)
Abs Immature Granulocytes: 0.01 10*3/uL (ref 0.00–0.07)
Basophils Absolute: 0 10*3/uL (ref 0.0–0.1)
Basophils Relative: 1 %
Eosinophils Absolute: 0.1 10*3/uL (ref 0.0–0.5)
Eosinophils Relative: 3 %
HCT: 31.9 % — ABNORMAL LOW (ref 39.0–52.0)
Hemoglobin: 10.8 g/dL — ABNORMAL LOW (ref 13.0–17.0)
Immature Granulocytes: 0 %
Lymphocytes Relative: 18 %
Lymphs Abs: 0.7 10*3/uL (ref 0.7–4.0)
MCH: 32.7 pg (ref 26.0–34.0)
MCHC: 33.9 g/dL (ref 30.0–36.0)
MCV: 96.7 fL (ref 80.0–100.0)
Monocytes Absolute: 0.5 10*3/uL (ref 0.1–1.0)
Monocytes Relative: 12 %
Neutro Abs: 2.6 10*3/uL (ref 1.7–7.7)
Neutrophils Relative %: 66 %
Platelet Count: 94 10*3/uL — ABNORMAL LOW (ref 150–400)
RBC: 3.3 MIL/uL — ABNORMAL LOW (ref 4.22–5.81)
RDW: 15.3 % (ref 11.5–15.5)
WBC Count: 3.9 10*3/uL — ABNORMAL LOW (ref 4.0–10.5)
nRBC: 0 % (ref 0.0–0.2)

## 2023-03-14 LAB — CEA (IN HOUSE-CHCC): CEA (CHCC-In House): 1.11 ng/mL (ref 0.00–5.00)

## 2023-03-14 MED ORDER — SODIUM CHLORIDE 0.9% FLUSH
10.0000 mL | INTRAVENOUS | Status: DC | PRN
  Administered 2023-03-14: 10 mL

## 2023-03-14 MED ORDER — SODIUM CHLORIDE 0.9 % IV SOLN: Freq: Once | INTRAVENOUS | Status: AC

## 2023-03-14 MED ORDER — SODIUM CHLORIDE 0.9 % IV SOLN
480.0000 mg | Freq: Once | INTRAVENOUS | Status: AC
  Administered 2023-03-14: 480 mg via INTRAVENOUS
  Filled 2023-03-14: qty 48

## 2023-03-14 MED ORDER — HEPARIN SOD (PORK) LOCK FLUSH 100 UNIT/ML IV SOLN
500.0000 [IU] | Freq: Once | INTRAVENOUS | Status: AC | PRN
  Administered 2023-03-14: 500 [IU]

## 2023-03-14 NOTE — Patient Instructions (Signed)

## 2023-03-14 NOTE — Progress Notes (Signed)
Ok to treat with platelets of 94 per Dr Myna Hidalgo. dph

## 2023-03-14 NOTE — Patient Instructions (Signed)
[facility]  Discharge Instructions: Thank you for choosing [facility] to provide your oncology and hematology care.   If you have a lab appointment with the [facility], please go directly to the [facility] and check in at the registration area.  Wear comfortable clothing and clothing appropriate for easy access to any Portacath or PICC line.   We strive to give you quality time with your provider. You may need to reschedule your appointment if you arrive late (15 or more minutes).  Arriving late affects you and other patients whose appointments are after yours.  Also, if you miss three or more appointments without notifying the office, you may be dismissed from the clinic at the provider's discretion.      For prescription refill requests, have your pharmacy contact our office and allow 72 hours for refills to be completed.    Today you received the following chemotherapy and/or immunotherapy agents OPdivo      To help prevent nausea and vomiting after your treatment, we encourage you to take your nausea medication as directed.  BELOW ARE SYMPTOMS THAT SHOULD BE REPORTED IMMEDIATELY: *FEVER GREATER THAN 100.4 F (38 C) OR HIGHER *CHILLS OR SWEATING *NAUSEA AND VOMITING THAT IS NOT CONTROLLED WITH YOUR NAUSEA MEDICATION *UNUSUAL SHORTNESS OF BREATH *UNUSUAL BRUISING OR BLEEDING *URINARY PROBLEMS (pain or burning when urinating, or frequent urination) *BOWEL PROBLEMS (unusual diarrhea, constipation, pain near the anus) TENDERNESS IN MOUTH AND THROAT WITH OR WITHOUT PRESENCE OF ULCERS (sore throat, sores in mouth, or a toothache) UNUSUAL RASH, SWELLING OR PAIN  UNUSUAL VAGINAL DISCHARGE OR ITCHING   Items with * indicate a potential emergency and should be followed up as soon as possible or go to the Emergency Department if any problems should occur.  Please show the CHEMOTHERAPY ALERT CARD or IMMUNOTHERAPY ALERT CARD at check-in  to the Emergency Department and triage nurse. Should you have questions after your visit or need to cancel or reschedule your appointment, please contact [facility]  [phone] and follow the prompts.  Office hours are 8:00 a.m. to 4:30 p.m. Monday - Friday. Please note that voicemails left after 4:00 p.m. may not be returned until the following business day.  We are closed weekends and major holidays. You have access to a nurse at all times for urgent questions. Please call the main number to the clinic [phone] and follow the prompts.  For any non-urgent questions, you may also contact your provider using MyChart. We now offer e-Visits for anyone 64 and older to request care online for non-urgent symptoms. For details visit [email or web address].   Also download the MyChart app! Go to the app store, search "MyChart", open the app, select [facility], and log in with your MyChart username and password.

## 2023-03-14 NOTE — Progress Notes (Signed)
See Hematology and Oncology Follow Up Visit  KHARSON FRANZEL [id number] [birth year] 82 y.o. 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 Myracle is here today for follow-up.  He seems to be doing pretty good.  Looks like he is lost some weight back she has not lost any weight.  His wife says that he is eating okay.  He is having no problems with nausea or vomiting.  He is having no problems with cough or shortness of breath.  He still smokes quite a bit.  We did do a PET scan on him.  This done on 03/06/2023.  This showed some increase in a right middle lobe nodule.  Surprising, it only increased 1 mm over the past 8 months.  He has an SUV of 8.8.  There is no other evidence of metastatic disease.  He has tolerated his treatments well.  He has had no problems with his thyroid.  We last checked his TSH in early July, that his TSH was 3.  He has had no change in bowel or bladder habits.  He has had no cough or increased shortness of breath.  He does like to be outside in his yard.  Overall, I would say that his performance status is probably ECOG 1.    Medications:  Allergies as of 03/14/2023   No Known Allergies      Medication List        Accurate as of March 14, 2023  9:05 AM. If you have any questions, ask your nurse or doctor.          aspirin 81 MG  chewable tablet Chew 1 tablet (81 mg total) by mouth 2 (two) times daily. What changed: when to take this   cyanocobalamin 1000 MCG tablet Commonly known as: VITAMIN B12 Take 1 tablet (1,000 mcg total) by mouth daily.   lidocaine-prilocaine cream Commonly known as: EMLA APPLY TO AFFECTED AREA ONCE AS DIRECTED   LORazepam 0.5 MG tablet Commonly known as: ATIVAN TAKE 1 TABLET BY MOUTH EVERY 8 HOURS AS NEEDED FOR ANXIETY   Stool Softener 100 MG capsule Generic drug: docusate sodium Take 100 mg by mouth daily as needed for mild constipation.   tiZANidine 2 MG tablet Commonly known as: ZANAFLEX 1-2 tablets Orally every 8 hrs as needed for muscle spasm   triamcinolone cream 0.1 % Commonly known as: KENALOG Apply 1 application. topically 2 (two) times daily.        Allergies: No Known Allergies  Past Medical History, Surgical history, Social history, and Family History were reviewed and updated.  Review of Systems: Review of Systems  Constitutional: Negative.   HENT: Negative.    Eyes: Negative.   Respiratory: Negative.    Cardiovascular: Negative.   Gastrointestinal: Negative.   Genitourinary: Negative.   Musculoskeletal: Negative.   Skin: Negative.   Neurological:  Positive for dizziness.  Endo/Heme/Allergies: Negative.  Psychiatric/Behavioral: Negative.       Physical Exam:  weight is 146 lb (66.2 kg). His oral temperature is 97.9 F (36.6 C). His blood pressure is 114/57 (abnormal) and his pulse is 85. His respiration is 18 and oxygen saturation is 100%.   Wt Readings from Last 3 Encounters:  03/14/23 146 lb (66.2 kg)  02/14/23 146 lb (66.2 kg)  07/03/22 155 lb (70.3 kg)    Physical Exam Vitals reviewed.  HENT:     Head: Normocephalic and atraumatic.  Eyes:     Pupils: Pupils are equal, round, and reactive to light.  Cardiovascular:     Rate and Rhythm: Normal rate and regular rhythm.     Heart sounds: Normal heart sounds.  Pulmonary:     Effort:  Pulmonary effort is normal.     Breath sounds: Normal breath sounds.  Abdominal:     General: Bowel sounds are normal.     Palpations: Abdomen is soft.  Musculoskeletal:        General: No tenderness or deformity. Normal range of motion.     Cervical back: Normal range of motion.  Lymphadenopathy:     Cervical: No cervical adenopathy.  Skin:    General: Skin is warm and dry.     Findings: No erythema or rash.  Neurological:     Mental Status: He is alert and oriented to person, place, and time.  Psychiatric:        Behavior: Behavior normal.        Thought Content: Thought content normal.        Judgment: Judgment normal.    Lab Results  Component Value Date   WBC 3.5 (L) 02/14/2023   HGB 11.1 (L) 02/14/2023   HCT 33.4 (L) 02/14/2023   MCV 97.1 02/14/2023   PLT 82 (L) 02/14/2023   Lab Results  Component Value Date   FERRITIN 73 04/12/2022   IRON 75 04/12/2022   TIBC 273 04/12/2022   UIBC 198 04/12/2022   IRONPCTSAT 28 04/12/2022   Lab Results  Component Value Date   RETICCTPCT 3.6 (H) 09/07/2021   RBC 3.44 (L) 02/14/2023   No results found for: "KPAFRELGTCHN", "LAMBDASER", "KAPLAMBRATIO" No results found for: "IGGSERUM", "IGA", "IGMSERUM" No results found for: "TOTALPROTELP", "ALBUMINELP", "A1GS", "A2GS", "BETS", "BETA2SER", "GAMS", "MSPIKE", "SPEI"   Chemistry      Component Value Date/Time   NA 140 02/14/2023 1415   NA 140 06/28/2011 0950   K 3.6 02/14/2023 1415   K 4.7 06/28/2011 0950   CL 108 02/14/2023 1415   CL 100 06/28/2011 0950   CO2 25 02/14/2023 1415   CO2 29 06/28/2011 0950   BUN 12 02/14/2023 1415   BUN 10 06/28/2011 0950   CREATININE 0.77 02/14/2023 1415   CREATININE 0.7 06/28/2011 0950      Component Value Date/Time   CALCIUM 8.6 (L) 02/14/2023 1415   CALCIUM 9.0 06/28/2011 0950   ALKPHOS 112 02/14/2023 1415   ALKPHOS 110 (H) 12/22/2010 0850   AST 9 (L) 02/14/2023 1415   ALT 6 02/14/2023 1415   ALT 18 12/22/2010 0850   BILITOT 0.9  02/14/2023 1415       Impression and Plan: Mr. Remache is a very pleasant 82 yo caucasian gentleman with previous history of right lung cancer as well as colon cancer 10 years ago. He now has stage IIIb adenocarcinoma of the lung, right middle lobe.   We initially treated him with systemic chemotherapy.  He was not a candidate  for surgery.  He was not a candidate for curative chemoradiation therapy together.  I am glad that the PET scan really is not DIFFERENT after about 8 months.  I think this is indicative of the fact that the immunotherapy seems to be doing pretty well.  We will continue to treat him.  Hopefully, if we see that his tumor is responding, may we can move his treatments a little bit longer.  We will plan to get him back in 3 weeks.     Josph Macho, MD 7/31/20249:05 AM

## 2023-03-15 ENCOUNTER — Other Ambulatory Visit: Payer: Self-pay

## 2023-04-11 ENCOUNTER — Inpatient Hospital Stay: Payer: Medicare Other | Attending: Hematology & Oncology

## 2023-04-11 ENCOUNTER — Inpatient Hospital Stay: Payer: Medicare Other

## 2023-04-11 ENCOUNTER — Encounter: Payer: Self-pay | Admitting: Hematology & Oncology

## 2023-04-11 VITALS — BP 140/75 | HR 58 | Temp 97.6°F | Wt 141.0 lb

## 2023-04-11 DIAGNOSIS — C3411 Malignant neoplasm of upper lobe, right bronchus or lung: Secondary | ICD-10-CM

## 2023-04-11 DIAGNOSIS — C786 Secondary malignant neoplasm of retroperitoneum and peritoneum: Secondary | ICD-10-CM | POA: Diagnosis not present

## 2023-04-11 DIAGNOSIS — C342 Malignant neoplasm of middle lobe, bronchus or lung: Secondary | ICD-10-CM | POA: Insufficient documentation

## 2023-04-11 DIAGNOSIS — Z5112 Encounter for antineoplastic immunotherapy: Secondary | ICD-10-CM | POA: Diagnosis not present

## 2023-04-11 DIAGNOSIS — Z79899 Other long term (current) drug therapy: Secondary | ICD-10-CM | POA: Diagnosis not present

## 2023-04-11 DIAGNOSIS — E039 Hypothyroidism, unspecified: Secondary | ICD-10-CM

## 2023-04-11 LAB — CMP (CANCER CENTER ONLY)
ALT: 5 U/L (ref 0–44)
AST: 11 U/L — ABNORMAL LOW (ref 15–41)
Albumin: 3.3 g/dL — ABNORMAL LOW (ref 3.5–5.0)
Alkaline Phosphatase: 108 U/L (ref 38–126)
Anion gap: 7 (ref 5–15)
BUN: 11 mg/dL (ref 8–23)
CO2: 25 mmol/L (ref 22–32)
Calcium: 8.5 mg/dL — ABNORMAL LOW (ref 8.9–10.3)
Chloride: 108 mmol/L (ref 98–111)
Creatinine: 0.81 mg/dL (ref 0.61–1.24)
GFR, Estimated: >60 mL/min (ref >60)
Glucose, Bld: 124 mg/dL — ABNORMAL HIGH (ref 70–99)
Potassium: 3.6 mmol/L (ref 3.5–5.1)
Sodium: 140 mmol/L (ref 135–145)
Total Bilirubin: 1.3 mg/dL — ABNORMAL HIGH (ref 0.3–1.2)
Total Protein: 5.6 g/dL — ABNORMAL LOW (ref 6.5–8.1)

## 2023-04-11 LAB — CBC WITH DIFFERENTIAL (CANCER CENTER ONLY)
Abs Immature Granulocytes: 0.02 10*3/uL (ref 0.00–0.07)
Basophils Absolute: 0 10*3/uL (ref 0.0–0.1)
Basophils Relative: 1 %
Eosinophils Absolute: 0.2 10*3/uL (ref 0.0–0.5)
Eosinophils Relative: 4 %
HCT: 32.4 % — ABNORMAL LOW (ref 39.0–52.0)
Hemoglobin: 10.8 g/dL — ABNORMAL LOW (ref 13.0–17.0)
Immature Granulocytes: 1 %
Lymphocytes Relative: 20 %
Lymphs Abs: 0.8 10*3/uL (ref 0.7–4.0)
MCH: 32.3 pg (ref 26.0–34.0)
MCHC: 33.3 g/dL (ref 30.0–36.0)
MCV: 97 fL (ref 80.0–100.0)
Monocytes Absolute: 0.5 10*3/uL (ref 0.1–1.0)
Monocytes Relative: 14 %
Neutro Abs: 2.4 10*3/uL (ref 1.7–7.7)
Neutrophils Relative %: 60 %
Platelet Count: 82 10*3/uL — ABNORMAL LOW (ref 150–400)
RBC: 3.34 MIL/uL — ABNORMAL LOW (ref 4.22–5.81)
RDW: 15.2 % (ref 11.5–15.5)
WBC Count: 3.8 10*3/uL — ABNORMAL LOW (ref 4.0–10.5)
nRBC: 0 % (ref 0.0–0.2)

## 2023-04-11 LAB — LACTATE DEHYDROGENASE: LDH: 140 U/L (ref 98–192)

## 2023-04-11 MED ORDER — SODIUM CHLORIDE 0.9 % IV SOLN: Freq: Once | INTRAVENOUS | Status: AC

## 2023-04-11 MED ORDER — HEPARIN SOD (PORK) LOCK FLUSH 100 UNIT/ML IV SOLN
500.0000 [IU] | Freq: Once | INTRAVENOUS | Status: AC | PRN
  Administered 2023-04-11: 500 [IU]

## 2023-04-11 MED ORDER — SODIUM CHLORIDE 0.9% FLUSH
10.0000 mL | INTRAVENOUS | Status: DC | PRN
  Administered 2023-04-11: 10 mL

## 2023-04-11 MED ORDER — SODIUM CHLORIDE 0.9 % IV SOLN
480.0000 mg | Freq: Once | INTRAVENOUS | Status: AC
  Administered 2023-04-11: 480 mg via INTRAVENOUS
  Filled 2023-04-11: qty 48

## 2023-04-11 NOTE — Patient Instructions (Signed)
[facility]  Discharge Instructions: Thank you for choosing [facility] to provide your oncology and hematology care.   If you have a lab appointment with the Cancer Center, please go directly to the Cancer Center and check in at the registration area.  Wear comfortable clothing and clothing appropriate for easy access to any Portacath or PICC line.   We strive to give you quality time with your provider. You may need to reschedule your appointment if you arrive late (15 or more minutes).  Arriving late affects you and other patients whose appointments are after yours.  Also, if you miss three or more appointments without notifying the office, you may be dismissed from the clinic at the provider's discretion.      For prescription refill requests, have your pharmacy contact our office and allow 72 hours for refills to be completed.    Today you received the following chemotherapy and/or immunotherapy agents:  Nivolumab      To help prevent nausea and vomiting after your treatment, we encourage you to take your nausea medication as directed.  BELOW ARE SYMPTOMS THAT SHOULD BE REPORTED IMMEDIATELY: *FEVER GREATER THAN 100.4 F (38 C) OR HIGHER *CHILLS OR SWEATING *NAUSEA AND VOMITING THAT IS NOT CONTROLLED WITH YOUR NAUSEA MEDICATION *UNUSUAL SHORTNESS OF BREATH *UNUSUAL BRUISING OR BLEEDING *URINARY PROBLEMS (pain or burning when urinating, or frequent urination) *BOWEL PROBLEMS (unusual diarrhea, constipation, pain near the anus) TENDERNESS IN MOUTH AND THROAT WITH OR WITHOUT PRESENCE OF ULCERS (sore throat, sores in mouth, or a toothache) UNUSUAL RASH, SWELLING OR PAIN  UNUSUAL VAGINAL DISCHARGE OR ITCHING   Items with * indicate a potential emergency and should be followed up as soon as possible or go to the Emergency Department if any problems should occur.  Please show the CHEMOTHERAPY ALERT CARD or IMMUNOTHERAPY ALERT CARD at  check-in to the Emergency Department and triage nurse. Should you have questions after your visit or need to cancel or reschedule your appointment, please contact [facility]  [phone] and follow the prompts.  Office hours are 8:00 a.m. to 4:30 p.m. Monday - Friday. Please note that voicemails left after 4:00 p.m. may not be returned until the following business day.  We are closed weekends and major holidays. You have access to a nurse at all times for urgent questions. Please call the main number to the clinic [phone] and follow the prompts.  For any non-urgent questions, you may also contact your provider using MyChart. We now offer e-Visits for anyone 18 and older to request care online for non-urgent symptoms. For details visit [email or web address].   Also download the MyChart app! Go to the app store, search "MyChart", open the app, select [facility], and log in with your MyChart username and password.   

## 2023-04-11 NOTE — Patient Instructions (Signed)

## 2023-04-11 NOTE — Progress Notes (Signed)
Plts 82 today. Per Maralyn Sago and Dr. Myna Hidalgo. Ok to proceed with treatment today. Pt. Feel stable to proceed.

## 2023-04-20 ENCOUNTER — Other Ambulatory Visit: Payer: Self-pay

## 2023-05-06 ENCOUNTER — Other Ambulatory Visit: Payer: Self-pay | Admitting: Hematology & Oncology

## 2023-05-07 ENCOUNTER — Encounter: Payer: Self-pay | Admitting: Hematology & Oncology

## 2023-05-09 ENCOUNTER — Encounter: Payer: Self-pay | Admitting: Hematology & Oncology

## 2023-05-09 ENCOUNTER — Inpatient Hospital Stay: Payer: Medicare Other

## 2023-05-09 ENCOUNTER — Inpatient Hospital Stay: Payer: Medicare Other | Attending: Hematology & Oncology | Admitting: Hematology & Oncology

## 2023-05-09 VITALS — BP 147/58 | HR 59

## 2023-05-09 VITALS — BP 113/56 | HR 70 | Temp 97.8°F | Resp 20 | Ht 68.0 in | Wt 147.0 lb

## 2023-05-09 DIAGNOSIS — C342 Malignant neoplasm of middle lobe, bronchus or lung: Secondary | ICD-10-CM | POA: Insufficient documentation

## 2023-05-09 DIAGNOSIS — C3411 Malignant neoplasm of upper lobe, right bronchus or lung: Secondary | ICD-10-CM

## 2023-05-09 DIAGNOSIS — Z5112 Encounter for antineoplastic immunotherapy: Secondary | ICD-10-CM | POA: Insufficient documentation

## 2023-05-09 DIAGNOSIS — Z79899 Other long term (current) drug therapy: Secondary | ICD-10-CM | POA: Diagnosis not present

## 2023-05-09 DIAGNOSIS — C772 Secondary and unspecified malignant neoplasm of intra-abdominal lymph nodes: Secondary | ICD-10-CM | POA: Insufficient documentation

## 2023-05-09 LAB — CBC WITH DIFFERENTIAL (CANCER CENTER ONLY)
Abs Immature Granulocytes: 0.01 10*3/uL (ref 0.00–0.07)
Basophils Absolute: 0 10*3/uL (ref 0.0–0.1)
Basophils Relative: 1 %
Eosinophils Absolute: 0.1 10*3/uL (ref 0.0–0.5)
Eosinophils Relative: 3 %
HCT: 31.6 % — ABNORMAL LOW (ref 39.0–52.0)
Hemoglobin: 10.5 g/dL — ABNORMAL LOW (ref 13.0–17.0)
Immature Granulocytes: 0 %
Lymphocytes Relative: 20 %
Lymphs Abs: 0.7 10*3/uL (ref 0.7–4.0)
MCH: 32.7 pg (ref 26.0–34.0)
MCHC: 33.2 g/dL (ref 30.0–36.0)
MCV: 98.4 fL (ref 80.0–100.0)
Monocytes Absolute: 0.4 10*3/uL (ref 0.1–1.0)
Monocytes Relative: 12 %
Neutro Abs: 2.1 10*3/uL (ref 1.7–7.7)
Neutrophils Relative %: 64 %
Platelet Count: 69 10*3/uL — ABNORMAL LOW (ref 150–400)
RBC: 3.21 MIL/uL — ABNORMAL LOW (ref 4.22–5.81)
RDW: 15.1 % (ref 11.5–15.5)
WBC Count: 3.3 10*3/uL — ABNORMAL LOW (ref 4.0–10.5)
nRBC: 0 % (ref 0.0–0.2)

## 2023-05-09 LAB — CMP (CANCER CENTER ONLY)
ALT: 6 U/L (ref 0–44)
AST: 13 U/L — ABNORMAL LOW (ref 15–41)
Albumin: 3.1 g/dL — ABNORMAL LOW (ref 3.5–5.0)
Alkaline Phosphatase: 94 U/L (ref 38–126)
Anion gap: 6 (ref 5–15)
BUN: 11 mg/dL (ref 8–23)
CO2: 25 mmol/L (ref 22–32)
Calcium: 8.2 mg/dL — ABNORMAL LOW (ref 8.9–10.3)
Chloride: 111 mmol/L (ref 98–111)
Creatinine: 0.76 mg/dL (ref 0.61–1.24)
GFR, Estimated: >60 mL/min (ref >60)
Glucose, Bld: 132 mg/dL — ABNORMAL HIGH (ref 70–99)
Potassium: 3.4 mmol/L — ABNORMAL LOW (ref 3.5–5.1)
Sodium: 142 mmol/L (ref 135–145)
Total Bilirubin: 0.8 mg/dL (ref 0.3–1.2)
Total Protein: 5.3 g/dL — ABNORMAL LOW (ref 6.5–8.1)

## 2023-05-09 LAB — TSH: TSH: 3.439 u[IU]/mL (ref 0.350–4.500)

## 2023-05-09 MED ORDER — SODIUM CHLORIDE 0.9 % IV SOLN
480.0000 mg | Freq: Once | INTRAVENOUS | Status: AC
  Administered 2023-05-09: 480 mg via INTRAVENOUS
  Filled 2023-05-09: qty 48

## 2023-05-09 MED ORDER — HEPARIN SOD (PORK) LOCK FLUSH 100 UNIT/ML IV SOLN
500.0000 [IU] | Freq: Once | INTRAVENOUS | Status: AC | PRN
  Administered 2023-05-09: 500 [IU]

## 2023-05-09 MED ORDER — SODIUM CHLORIDE 0.9 % IV SOLN: Freq: Once | INTRAVENOUS | Status: AC

## 2023-05-09 MED ORDER — SODIUM CHLORIDE 0.9% FLUSH
10.0000 mL | INTRAVENOUS | Status: DC | PRN
  Administered 2023-05-09: 10 mL

## 2023-05-09 NOTE — Progress Notes (Signed)
See Hematology and Oncology Follow Up Visit  QUAVON WACEK [id number] 10/25/1940 82 y.o. 05/09/2023   Principle Diagnosis:  Stage IIIB ([id number]) adenocarcinoma of the right middle lung History of stage Ia (T1aN0M0) adenocarcinoma the right lung - resected in March 2011 History of stage II adenocarcinoma of the colon-high risk - April 2010 Metastatic adenocarcinoma of the lung --retroperitoneal nodal metastasis and new left lung nodule  --high PD-L1   Current Therapy:        Carboplatin/Alimta/Pembrolizumab - started 05/10/2020- s/p cycle 6 Radiation with weekly carboplatin/Taxol-start on 11/01/2020 -- completed on 12/20/2020 Yervoy/Opdivo --s/p cycle #4  -- start on 09/15/2021 Nivolumab -- maintenance -- s/p cycle #9 -- start on 01/17/2022    Interim History:  Mr. Poppen is here today for follow-up.  He is managing pretty well.  He is trying to stay active at home.  He says he is eating okay.  His weight is holding pretty stable.  He has had no problems with pain although has some chronic lower back pain.  He applies some over-the-counter pain patches for this which she says helps.  He has had no problems with nausea or vomiting.  There is been no change in bowel or bladder habits.  He has had no melena or bright red blood per rectum.  I have noted that his blood counts are dropping a little bit.  I am not sure exactly as to why they are dropping.  We will have to watch this closely.   He has had no fever.  There has been no mouth sores.  Unfortunately, he continues to smoke quite a bit.  His wife is at home because of her COPD.  Overall, I would say that his performance status is ECOG 1-2.    Medications:  Allergies as of 05/09/2023   No Known Allergies      Medication List        Accurate as of May 09, 2023 10:09 AM. If you have any questions, ask your nurse or doctor.          STOP taking these medications    tiZANidine 2 MG tablet Commonly known as:  ZANAFLEX Stopped by: Josph Macho   triamcinolone cream 0.1 % Commonly known as: KENALOG Stopped by: Josph Macho       TAKE these medications    aspirin 81 MG chewable tablet Chew 1 tablet (81 mg total) by mouth 2 (two) times daily. What changed: when to take this   cyanocobalamin 1000 MCG tablet Commonly known as: VITAMIN B12 Take 1 tablet (1,000 mcg total) by mouth daily.   lidocaine 5 % Commonly known as: LIDODERM Place 1 patch onto the skin daily. Remove & Discard patch within 12 hours or as directed by MD   lidocaine-prilocaine cream Commonly known as: EMLA APPLY TO AFFECTED AREA ONCE AS DIRECTED   LORazepam 0.5 MG tablet Commonly known as: ATIVAN TAKE 1 TABLET BY MOUTH EVERY 8 HOURS AS NEEDED FOR ANXIETY   Stool Softener 100 MG capsule Generic drug: docusate sodium Take 100 mg by mouth daily as needed for mild constipation.        Allergies: No Known Allergies  Past Medical History, Surgical history, Social history, and Family History were reviewed and updated.  Review of Systems: Review of Systems  Constitutional: Negative.   HENT: Negative.    Eyes: Negative.   Respiratory: Negative.    Cardiovascular: Negative.   Gastrointestinal: Negative.   Genitourinary: Negative.   Musculoskeletal: Negative.  Skin: Negative.   Neurological:  Positive for dizziness.  Endo/Heme/Allergies: Negative.   Psychiatric/Behavioral: Negative.       Physical Exam:  height is 5\' 8"  (1.727 m) and weight is 147 lb (66.7 kg). His oral temperature is 97.8 F (36.6 C). His blood pressure is 113/56 (abnormal) and his pulse is 70. His respiration is 20 and oxygen saturation is 100%.   Wt Readings from Last 3 Encounters:  05/09/23 147 lb (66.7 kg)  04/11/23 141 lb (64 kg)  03/14/23 146 lb (66.2 kg)    Physical Exam Vitals reviewed.  HENT:     Head: Normocephalic and atraumatic.  Eyes:     Pupils: Pupils are equal, round, and reactive to light.   Cardiovascular:     Rate and Rhythm: Normal rate and regular rhythm.     Heart sounds: Normal heart sounds.  Pulmonary:     Effort: Pulmonary effort is normal.     Breath sounds: Normal breath sounds.  Abdominal:     General: Bowel sounds are normal.     Palpations: Abdomen is soft.  Musculoskeletal:        General: No tenderness or deformity. Normal range of motion.     Cervical back: Normal range of motion.  Lymphadenopathy:     Cervical: No cervical adenopathy.  Skin:    General: Skin is warm and dry.     Findings: No erythema or rash.  Neurological:     Mental Status: He is alert and oriented to person, place, and time.  Psychiatric:        Behavior: Behavior normal.        Thought Content: Thought content normal.        Judgment: Judgment normal.     Lab Results  Component Value Date   WBC 3.3 (L) 05/09/2023   HGB 10.5 (L) 05/09/2023   HCT 31.6 (L) 05/09/2023   MCV 98.4 05/09/2023   PLT 69 (L) 05/09/2023   Lab Results  Component Value Date   FERRITIN 73 04/12/2022   IRON 75 04/12/2022   TIBC 273 04/12/2022   UIBC 198 04/12/2022   IRONPCTSAT 28 04/12/2022   Lab Results  Component Value Date   RETICCTPCT 3.6 (H) 09/07/2021   RBC 3.21 (L) 05/09/2023   No results found for: "KPAFRELGTCHN", "LAMBDASER", "KAPLAMBRATIO" No results found for: "IGGSERUM", "IGA", "IGMSERUM" No results found for: "TOTALPROTELP", "ALBUMINELP", "A1GS", "A2GS", "BETS", "BETA2SER", "GAMS", "MSPIKE", "SPEI"   Chemistry      Component Value Date/Time   NA 142 05/09/2023 0845   NA 140 06/28/2011 0950   K 3.4 (L) 05/09/2023 0845   K 4.7 06/28/2011 0950   CL 111 05/09/2023 0845   CL 100 06/28/2011 0950   CO2 25 05/09/2023 0845   CO2 29 06/28/2011 0950   BUN 11 05/09/2023 0845   BUN 10 06/28/2011 0950   CREATININE 0.76 05/09/2023 0845   CREATININE 0.7 06/28/2011 0950      Component Value Date/Time   CALCIUM 8.2 (L) 05/09/2023 0845   CALCIUM 9.0 06/28/2011 0950   ALKPHOS 94  05/09/2023 0845   ALKPHOS 110 (H) 12/22/2010 0850   AST 13 (L) 05/09/2023 0845   ALT 6 05/09/2023 0845   ALT 18 12/22/2010 0850   BILITOT 0.8 05/09/2023 0845       Impression and Plan: Mr. Difranco is a very pleasant 82 yo caucasian gentleman with previous history of right lung cancer as well as colon cancer 10 years ago. He now has stage  IIIb adenocarcinoma of the lung, right middle lobe.   We initially treated him with systemic chemotherapy.  He was not a candidate for surgery.  He was not a candidate for curative chemoradiation therapy together.  We will go ahead with his nivolumab today.  His last PET scan was done back in July.  Will have to get 1 set up for him sometime in November.  We will have to watch his pancytopenia.  This is mild.  Again, he can certainly have an element of myelodysplasia.  At some point, if we see the blood counts not improving, we may have to consider a bone marrow biopsy for him.  We will plan to get him back in 1 month.     Josph Macho, MD 9/25/202410:09 AM

## 2023-05-09 NOTE — Patient Instructions (Signed)
[facility]  Discharge Instructions: Thank you for choosing [facility] to provide your oncology and hematology care.   If you have a lab appointment with the Cancer Center, please go directly to the Cancer Center and check in at the registration area.  Wear comfortable clothing and clothing appropriate for easy access to any Portacath or PICC line.   We strive to give you quality time with your provider. You may need to reschedule your appointment if you arrive late (15 or more minutes).  Arriving late affects you and other patients whose appointments are after yours.  Also, if you miss three or more appointments without notifying the office, you may be dismissed from the clinic at the provider's discretion.      For prescription refill requests, have your pharmacy contact our office and allow 72 hours for refills to be completed.    Today you received the following chemotherapy and/or immunotherapy agents opdivo       To help prevent nausea and vomiting after your treatment, we encourage you to take your nausea medication as directed.  BELOW ARE SYMPTOMS THAT SHOULD BE REPORTED IMMEDIATELY: *FEVER GREATER THAN 100.4 F (38 C) OR HIGHER *CHILLS OR SWEATING *NAUSEA AND VOMITING THAT IS NOT CONTROLLED WITH YOUR NAUSEA MEDICATION *UNUSUAL SHORTNESS OF BREATH *UNUSUAL BRUISING OR BLEEDING *URINARY PROBLEMS (pain or burning when urinating, or frequent urination) *BOWEL PROBLEMS (unusual diarrhea, constipation, pain near the anus) TENDERNESS IN MOUTH AND THROAT WITH OR WITHOUT PRESENCE OF ULCERS (sore throat, sores in mouth, or a toothache) UNUSUAL RASH, SWELLING OR PAIN  UNUSUAL VAGINAL DISCHARGE OR ITCHING   Items with * indicate a potential emergency and should be followed up as soon as possible or go to the Emergency Department if any problems should occur.  Please show the CHEMOTHERAPY ALERT CARD or IMMUNOTHERAPY ALERT CARD at check-in  to the Emergency Department and triage nurse. Should you have questions after your visit or need to cancel or reschedule your appointment, please contact [facility]  [phone] and follow the prompts.  Office hours are 8:00 a.m. to 4:30 p.m. Monday - Friday. Please note that voicemails left after 4:00 p.m. may not be returned until the following business day.  We are closed weekends and major holidays. You have access to a nurse at all times for urgent questions. Please call the main number to the clinic [phone] and follow the prompts.  For any non-urgent questions, you may also contact your provider using MyChart. We now offer e-Visits for anyone 78 and older to request care online for non-urgent symptoms. For details visit [email or web address].   Also download the MyChart app! Go to the app store, search "MyChart", open the app, select [place], and log in with your MyChart username and password.

## 2023-05-10 ENCOUNTER — Other Ambulatory Visit: Payer: Self-pay

## 2023-05-10 LAB — T4: T4, Total: 7.6 ug/dL (ref 4.5–12.0)

## 2023-05-11 ENCOUNTER — Other Ambulatory Visit: Payer: Self-pay

## 2023-06-06 ENCOUNTER — Inpatient Hospital Stay: Payer: Medicare Other

## 2023-06-06 ENCOUNTER — Inpatient Hospital Stay (HOSPITAL_BASED_OUTPATIENT_CLINIC_OR_DEPARTMENT_OTHER): Payer: Medicare Other | Admitting: Medical Oncology

## 2023-06-06 ENCOUNTER — Inpatient Hospital Stay: Payer: Medicare Other | Attending: Hematology & Oncology

## 2023-06-06 ENCOUNTER — Encounter: Payer: Self-pay | Admitting: Medical Oncology

## 2023-06-06 VITALS — BP 128/49 | HR 60 | Temp 96.6°F | Resp 18

## 2023-06-06 DIAGNOSIS — E079 Disorder of thyroid, unspecified: Secondary | ICD-10-CM

## 2023-06-06 DIAGNOSIS — C772 Secondary and unspecified malignant neoplasm of intra-abdominal lymph nodes: Secondary | ICD-10-CM | POA: Diagnosis not present

## 2023-06-06 DIAGNOSIS — C342 Malignant neoplasm of middle lobe, bronchus or lung: Secondary | ICD-10-CM | POA: Diagnosis not present

## 2023-06-06 DIAGNOSIS — Z79899 Other long term (current) drug therapy: Secondary | ICD-10-CM | POA: Insufficient documentation

## 2023-06-06 DIAGNOSIS — C3411 Malignant neoplasm of upper lobe, right bronchus or lung: Secondary | ICD-10-CM

## 2023-06-06 DIAGNOSIS — Z5112 Encounter for antineoplastic immunotherapy: Secondary | ICD-10-CM | POA: Insufficient documentation

## 2023-06-06 LAB — RETICULOCYTES
Immature Retic Fract: 12.3 % (ref 2.3–15.9)
RBC.: 3.09 MIL/uL — ABNORMAL LOW (ref 4.22–5.81)
Retic Count, Absolute: 86.2 10*3/uL (ref 19.0–186.0)
Retic Ct Pct: 2.8 % (ref 0.4–3.1)

## 2023-06-06 LAB — CBC WITH DIFFERENTIAL (CANCER CENTER ONLY)
Abs Immature Granulocytes: 0.02 10*3/uL (ref 0.00–0.07)
Basophils Absolute: 0 10*3/uL (ref 0.0–0.1)
Basophils Relative: 1 %
Eosinophils Absolute: 0.1 10*3/uL (ref 0.0–0.5)
Eosinophils Relative: 3 %
HCT: 30.3 % — ABNORMAL LOW (ref 39.0–52.0)
Hemoglobin: 10.1 g/dL — ABNORMAL LOW (ref 13.0–17.0)
Immature Granulocytes: 1 %
Lymphocytes Relative: 19 %
Lymphs Abs: 0.7 10*3/uL (ref 0.7–4.0)
MCH: 32.5 pg (ref 26.0–34.0)
MCHC: 33.3 g/dL (ref 30.0–36.0)
MCV: 97.4 fL (ref 80.0–100.0)
Monocytes Absolute: 0.5 10*3/uL (ref 0.1–1.0)
Monocytes Relative: 12 %
Neutro Abs: 2.5 10*3/uL (ref 1.7–7.7)
Neutrophils Relative %: 64 %
Platelet Count: 80 10*3/uL — ABNORMAL LOW (ref 150–400)
RBC: 3.11 MIL/uL — ABNORMAL LOW (ref 4.22–5.81)
RDW: 15.2 % (ref 11.5–15.5)
WBC Count: 3.8 10*3/uL — ABNORMAL LOW (ref 4.0–10.5)
nRBC: 0 % (ref 0.0–0.2)

## 2023-06-06 LAB — CMP (CANCER CENTER ONLY)
ALT: 5 U/L (ref 0–44)
AST: 11 U/L — ABNORMAL LOW (ref 15–41)
Albumin: 3.1 g/dL — ABNORMAL LOW (ref 3.5–5.0)
Alkaline Phosphatase: 96 U/L (ref 38–126)
Anion gap: 7 (ref 5–15)
BUN: 15 mg/dL (ref 8–23)
CO2: 25 mmol/L (ref 22–32)
Calcium: 8.2 mg/dL — ABNORMAL LOW (ref 8.9–10.3)
Chloride: 111 mmol/L (ref 98–111)
Creatinine: 0.76 mg/dL (ref 0.61–1.24)
GFR, Estimated: >60 mL/min (ref >60)
Glucose, Bld: 108 mg/dL — ABNORMAL HIGH (ref 70–99)
Potassium: 3.4 mmol/L — ABNORMAL LOW (ref 3.5–5.1)
Sodium: 143 mmol/L (ref 135–145)
Total Bilirubin: 1.2 mg/dL (ref 0.3–1.2)
Total Protein: 5.2 g/dL — ABNORMAL LOW (ref 6.5–8.1)

## 2023-06-06 LAB — FERRITIN: Ferritin: 83 ng/mL (ref 24–336)

## 2023-06-06 LAB — TSH: TSH: 5.377 u[IU]/mL — ABNORMAL HIGH (ref 0.350–4.500)

## 2023-06-06 LAB — VITAMIN B12: Vitamin B-12: 938 pg/mL — ABNORMAL HIGH (ref 180–914)

## 2023-06-06 LAB — IRON AND IRON BINDING CAPACITY (CC-WL,HP ONLY)
Iron: 52 ug/dL (ref 45–182)
Saturation Ratios: 27 % (ref 17.9–39.5)
TIBC: 196 ug/dL — ABNORMAL LOW (ref 250–450)
UIBC: 144 ug/dL (ref 117–376)

## 2023-06-06 LAB — LACTATE DEHYDROGENASE: LDH: 147 U/L (ref 98–192)

## 2023-06-06 LAB — SAVE SMEAR(SSMR), FOR PROVIDER SLIDE REVIEW

## 2023-06-06 MED ORDER — HEPARIN SOD (PORK) LOCK FLUSH 100 UNIT/ML IV SOLN
500.0000 [IU] | Freq: Once | INTRAVENOUS | Status: AC | PRN
  Administered 2023-06-06: 500 [IU]

## 2023-06-06 MED ORDER — SODIUM CHLORIDE 0.9% FLUSH
10.0000 mL | INTRAVENOUS | Status: DC | PRN
Start: 2023-06-06 — End: 2023-06-06
  Administered 2023-06-06: 10 mL

## 2023-06-06 MED ORDER — SODIUM CHLORIDE 0.9 % IV SOLN
480.0000 mg | Freq: Once | INTRAVENOUS | Status: AC
  Administered 2023-06-06: 480 mg via INTRAVENOUS
  Filled 2023-06-06: qty 48

## 2023-06-06 MED ORDER — SODIUM CHLORIDE 0.9 % IV SOLN: Freq: Once | INTRAVENOUS | Status: AC

## 2023-06-06 NOTE — Patient Instructions (Signed)

## 2023-06-06 NOTE — Patient Instructions (Signed)
[facility]  Discharge Instructions: Thank you for choosing [facility] to provide your oncology and hematology care.   If you have a lab appointment with the Cancer Center, please go directly to the Cancer Center and check in at the registration area.  Wear comfortable clothing and clothing appropriate for easy access to any Portacath or PICC line.   We strive to give you quality time with your provider. You may need to reschedule your appointment if you arrive late (15 or more minutes).  Arriving late affects you and other patients whose appointments are after yours.  Also, if you miss three or more appointments without notifying the office, you may be dismissed from the clinic at the provider's discretion.      For prescription refill requests, have your pharmacy contact our office and allow 72 hours for refills to be completed.    Today you received the following chemotherapy and/or immunotherapy agents Opdivo.      To help prevent nausea and vomiting after your treatment, we encourage you to take your nausea medication as directed.  BELOW ARE SYMPTOMS THAT SHOULD BE REPORTED IMMEDIATELY: *FEVER GREATER THAN 100.4 F (38 C) OR HIGHER *CHILLS OR SWEATING *NAUSEA AND VOMITING THAT IS NOT CONTROLLED WITH YOUR NAUSEA MEDICATION *UNUSUAL SHORTNESS OF BREATH *UNUSUAL BRUISING OR BLEEDING *URINARY PROBLEMS (pain or burning when urinating, or frequent urination) *BOWEL PROBLEMS (unusual diarrhea, constipation, pain near the anus) TENDERNESS IN MOUTH AND THROAT WITH OR WITHOUT PRESENCE OF ULCERS (sore throat, sores in mouth, or a toothache) UNUSUAL RASH, SWELLING OR PAIN  UNUSUAL VAGINAL DISCHARGE OR ITCHING   Items with * indicate a potential emergency and should be followed up as soon as possible or go to the Emergency Department if any problems should occur.  Please show the CHEMOTHERAPY ALERT CARD or IMMUNOTHERAPY ALERT CARD at check-in  to the Emergency Department and triage nurse. Should you have questions after your visit or need to cancel or reschedule your appointment, please contact [facility]  [phone] and follow the prompts.  Office hours are 8:00 a.m. to 4:30 p.m. Monday - Friday. Please note that voicemails left after 4:00 p.m. may not be returned until the following business day.  We are closed weekends and major holidays. You have access to a nurse at all times for urgent questions. Please call the main number to the clinic [phone] and follow the prompts.  For any non-urgent questions, you may also contact your provider using MyChart. We now offer e-Visits for anyone 18 and older to request care online for non-urgent symptoms. For details visit [email or web address].   Also download the MyChart app! Go to the app store, search "MyChart", open the app, select [place], and log in with your MyChart username and password.   

## 2023-06-06 NOTE — Progress Notes (Signed)
Ok to treat with platelets of 80 per Jerome Kelley, [place]. dph

## 2023-06-06 NOTE — Progress Notes (Signed)
See Hematology and Oncology Follow Up Visit  Jerome Kelley [id number] 12/29/1940 82 y.o. 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 History of stage Ia (T1aN0M0) adenocarcinoma the right lung - resected in March 2011 History of stage II adenocarcinoma of the colon-high risk - April 2010 Metastatic adenocarcinoma of the lung --retroperitoneal nodal metastasis and new left lung nodule  --high PD-L1   Past Therapy:        Carboplatin/Alimta/Pembrolizumab - started 05/10/2020- s/p cycle 6 Radiation with weekly carboplatin/Taxol-start on 11/01/2020 -- completed on 12/20/2020 Yervoy/Opdivo --s/p cycle #4  -- start on 09/15/2021  Current Therapy:  Nivolumab -- maintenance -- s/p cycle #9 -- start on 01/17/2022    Interim History:  Jerome Kelley is here today for follow-up and considering of maintenance Opdivo.    He reports that he is doing well. He has no concerns today.   He has been tolerating his maintenance Opdivo well. Platelets have actually improved on treatment.   Denies any significant pain.   He has had no problems with nausea or vomiting.  There is been no change in bowel or bladder habits.  He has had no melena or bright red blood per rectum.  I have noted that his blood counts are dropping a little bit.  I am not sure exactly as to why they are dropping.  We will have to watch this closely.   He has had no fever.  There has been no mouth sores.  He is still smoking less than 1 pack per day now.   Overall, I would say that his performance status is ECOG 1-2.   Wt Readings from Last 3 Encounters:  05/09/23 147 lb (66.7 kg)  04/11/23 141 lb (64 kg)  03/14/23 146 lb (66.2 kg)    Medications:  Allergies as of 06/06/2023   No Known Allergies      Medication List        Accurate as of June 06, 2023 10:16 AM. If you have any questions, ask your nurse or doctor.          aspirin 81 MG chewable tablet Chew 1  tablet (81 mg total) by mouth 2 (two) times daily. What changed: when to take this   cyanocobalamin 1000 MCG tablet Commonly known as: VITAMIN B12 Take 1 tablet (1,000 mcg total) by mouth daily.   lidocaine 5 % Commonly known as: LIDODERM Place 1 patch onto the skin daily. Remove & Discard patch within 12 hours or as directed by MD   lidocaine-prilocaine cream Commonly known as: EMLA APPLY TO AFFECTED AREA ONCE AS DIRECTED   LORazepam 0.5 MG tablet Commonly known as: ATIVAN TAKE 1 TABLET BY MOUTH EVERY 8 HOURS AS NEEDED FOR ANXIETY   Stool Softener 100 MG capsule Generic drug: docusate sodium Take 100 mg by mouth daily as needed for mild constipation.        Allergies: No Known Allergies  Past Medical History, Surgical history, Social history, and Family History were reviewed and updated.  Review of Systems: Review of Systems  Constitutional: Negative.   HENT: Negative.    Eyes: Negative.   Respiratory: Negative.    Cardiovascular: Negative.   Gastrointestinal: Negative.   Genitourinary: Negative.   Musculoskeletal: Negative.   Skin: Negative.   Neurological:  Negative for dizziness.  Endo/Heme/Allergies: Negative.   Psychiatric/Behavioral: Negative.       Physical Exam:  axillary temperature is 96.6 F (35.9 C) (abnormal). His blood  pressure is 128/49 (abnormal) and his pulse is 60. His respiration is 18 and oxygen saturation is 100%.   Wt Readings from Last 3 Encounters:  05/09/23 147 lb (66.7 kg)  04/11/23 141 lb (64 kg)  03/14/23 146 lb (66.2 kg)    Physical Exam Vitals reviewed.  HENT:     Head: Normocephalic and atraumatic.  Eyes:     Pupils: Pupils are equal, round, and reactive to light.  Cardiovascular:     Rate and Rhythm: Normal rate and regular rhythm.     Heart sounds: Normal heart sounds.  Pulmonary:     Effort: Pulmonary effort is normal.     Breath sounds: Normal breath sounds.  Abdominal:     General: Bowel sounds are normal.      Palpations: Abdomen is soft.  Musculoskeletal:        General: No tenderness or deformity. Normal range of motion.     Cervical back: Normal range of motion.  Lymphadenopathy:     Cervical: No cervical adenopathy.  Skin:    General: Skin is warm and dry.     Findings: No erythema or rash.  Neurological:     Mental Status: He is alert and oriented to person, place, and time.  Psychiatric:        Behavior: Behavior normal.        Thought Content: Thought content normal.        Judgment: Judgment normal.     Lab Results  Component Value Date   WBC 3.8 (L) 06/06/2023   HGB 10.1 (L) 06/06/2023   HCT 30.3 (L) 06/06/2023   MCV 97.4 06/06/2023   PLT 80 (L) 06/06/2023   Lab Results  Component Value Date   FERRITIN 73 04/12/2022   IRON 75 04/12/2022   TIBC 273 04/12/2022   UIBC 198 04/12/2022   IRONPCTSAT 28 04/12/2022   Lab Results  Component Value Date   RETICCTPCT 2.8 06/06/2023   RBC 3.09 (L) 06/06/2023   RBC 3.11 (L) 06/06/2023   No results found for: "KPAFRELGTCHN", "LAMBDASER", "KAPLAMBRATIO" No results found for: "IGGSERUM", "IGA", "IGMSERUM" No results found for: "TOTALPROTELP", "ALBUMINELP", "A1GS", "A2GS", "BETS", "BETA2SER", "GAMS", "MSPIKE", "SPEI"   Chemistry      Component Value Date/Time   NA 143 06/06/2023 0910   NA 140 06/28/2011 0950   K 3.4 (L) 06/06/2023 0910   K 4.7 06/28/2011 0950   CL 111 06/06/2023 0910   CL 100 06/28/2011 0950   CO2 25 06/06/2023 0910   CO2 29 06/28/2011 0950   BUN 15 06/06/2023 0910   BUN 10 06/28/2011 0950   CREATININE 0.76 06/06/2023 0910   CREATININE 0.7 06/28/2011 0950      Component Value Date/Time   CALCIUM 8.2 (L) 06/06/2023 0910   CALCIUM 9.0 06/28/2011 0950   ALKPHOS 96 06/06/2023 0910   ALKPHOS 110 (H) 12/22/2010 0850   AST 11 (L) 06/06/2023 0910   ALT 5 06/06/2023 0910   ALT 18 12/22/2010 0850   BILITOT 1.2 06/06/2023 0910     Encounter Diagnoses  Name Primary?   Malignant neoplasm of upper lobe of  right lung (HCC)    Disorder of thyroid, unspecified Yes   Impression and Plan: Jerome Kelley is a very pleasant 82 yo caucasian gentleman with previous history of right lung cancer as well as colon cancer over 10 years ago. He now has stage IIIb adenocarcinoma of the lung, right middle lobe.   We initially treated him with systemic chemotherapy.  He was not a candidate for surgery.  He was not a candidate for curative chemoradiation therapy together.  He continues to do well on maintenance Opdivo. Labs reviewed and acceptable for treatment today  His last PET scan was done back in July.He is due for a follow up scan in Nov- this has been scheduled   We will continue to monitor his pancytopenia. Improved as of today.   RTC 1 month MD, lab (TSH, T4, CMP, CBC w/ diff, LDH), Opdvio -[place]  Rushie Chestnut, PA-C 10/23/202410:16 AM

## 2023-06-07 ENCOUNTER — Other Ambulatory Visit: Payer: Self-pay

## 2023-06-07 LAB — T4: T4, Total: 7.5 ug/dL (ref 4.5–12.0)

## 2023-06-08 LAB — ERYTHROPOIETIN: Erythropoietin: 43.8 m[IU]/mL — ABNORMAL HIGH (ref 2.6–18.5)

## 2023-06-26 ENCOUNTER — Encounter (HOSPITAL_COMMUNITY)
Admission: RE | Admit: 2023-06-26 | Discharge: 2023-06-26 | Disposition: A | Discharge status: Home / Self Care | Payer: Medicare Other | Source: Ambulatory Visit | Attending: Hematology & Oncology | Admitting: Hematology & Oncology

## 2023-06-26 DIAGNOSIS — C3411 Malignant neoplasm of upper lobe, right bronchus or lung: Secondary | ICD-10-CM | POA: Diagnosis not present

## 2023-06-26 LAB — GLUCOSE, CAPILLARY: Glucose-Capillary: 87 mg/dL (ref 70–99)

## 2023-06-26 MED ORDER — FLUDEOXYGLUCOSE F - 18 (FDG) INJECTION
7.4000 | Freq: Once | INTRAVENOUS | Status: AC | PRN
  Administered 2023-06-26: 7.31 via INTRAVENOUS

## 2023-06-29 ENCOUNTER — Other Ambulatory Visit: Payer: Self-pay

## 2023-07-02 ENCOUNTER — Other Ambulatory Visit: Payer: Self-pay | Admitting: Hematology & Oncology

## 2023-07-06 ENCOUNTER — Other Ambulatory Visit: Payer: Self-pay | Admitting: *Deleted

## 2023-07-06 ENCOUNTER — Inpatient Hospital Stay: Payer: Medicare Other

## 2023-07-06 ENCOUNTER — Encounter: Payer: Self-pay | Admitting: Oncology

## 2023-07-06 ENCOUNTER — Inpatient Hospital Stay (HOSPITAL_BASED_OUTPATIENT_CLINIC_OR_DEPARTMENT_OTHER): Payer: Medicare Other | Admitting: Oncology

## 2023-07-06 ENCOUNTER — Inpatient Hospital Stay: Payer: Medicare Other | Attending: Hematology & Oncology

## 2023-07-06 VITALS — BP 129/53 | HR 62 | Resp 17

## 2023-07-06 VITALS — BP 109/54 | HR 79 | Temp 97.1°F | Resp 18 | Ht 68.0 in | Wt 155.8 lb

## 2023-07-06 DIAGNOSIS — C772 Secondary and unspecified malignant neoplasm of intra-abdominal lymph nodes: Secondary | ICD-10-CM | POA: Insufficient documentation

## 2023-07-06 DIAGNOSIS — Z95828 Presence of other vascular implants and grafts: Secondary | ICD-10-CM | POA: Diagnosis not present

## 2023-07-06 DIAGNOSIS — C3411 Malignant neoplasm of upper lobe, right bronchus or lung: Secondary | ICD-10-CM | POA: Diagnosis not present

## 2023-07-06 DIAGNOSIS — I10 Essential (primary) hypertension: Secondary | ICD-10-CM

## 2023-07-06 DIAGNOSIS — Z79899 Other long term (current) drug therapy: Secondary | ICD-10-CM | POA: Diagnosis not present

## 2023-07-06 DIAGNOSIS — E038 Other specified hypothyroidism: Secondary | ICD-10-CM

## 2023-07-06 DIAGNOSIS — E039 Hypothyroidism, unspecified: Secondary | ICD-10-CM | POA: Diagnosis not present

## 2023-07-06 DIAGNOSIS — C342 Malignant neoplasm of middle lobe, bronchus or lung: Secondary | ICD-10-CM | POA: Insufficient documentation

## 2023-07-06 DIAGNOSIS — E079 Disorder of thyroid, unspecified: Secondary | ICD-10-CM

## 2023-07-06 DIAGNOSIS — Z5112 Encounter for antineoplastic immunotherapy: Secondary | ICD-10-CM | POA: Insufficient documentation

## 2023-07-06 LAB — CMP (CANCER CENTER ONLY)
ALT: 7 U/L (ref 0–44)
AST: 13 U/L — ABNORMAL LOW (ref 15–41)
Albumin: 3 g/dL — ABNORMAL LOW (ref 3.5–5.0)
Alkaline Phosphatase: 97 U/L (ref 38–126)
Anion gap: 9 (ref 5–15)
BUN: 16 mg/dL (ref 8–23)
CO2: 23 mmol/L (ref 22–32)
Calcium: 8.5 mg/dL — ABNORMAL LOW (ref 8.9–10.3)
Chloride: 111 mmol/L (ref 98–111)
Creatinine: 0.77 mg/dL (ref 0.61–1.24)
GFR, Estimated: >60 mL/min (ref >60)
Glucose, Bld: 134 mg/dL — ABNORMAL HIGH (ref 70–99)
Potassium: 3.3 mmol/L — ABNORMAL LOW (ref 3.5–5.1)
Sodium: 143 mmol/L (ref 135–145)
Total Bilirubin: 1.5 mg/dL — ABNORMAL HIGH (ref <1.2)
Total Protein: 5.6 g/dL — ABNORMAL LOW (ref 6.5–8.1)

## 2023-07-06 LAB — CBC WITH DIFFERENTIAL (CANCER CENTER ONLY)
Abs Immature Granulocytes: 0.02 10*3/uL (ref 0.00–0.07)
Basophils Absolute: 0 10*3/uL (ref 0.0–0.1)
Basophils Relative: 1 %
Eosinophils Absolute: 0.1 10*3/uL (ref 0.0–0.5)
Eosinophils Relative: 3 %
HCT: 30.7 % — ABNORMAL LOW (ref 39.0–52.0)
Hemoglobin: 10.1 g/dL — ABNORMAL LOW (ref 13.0–17.0)
Immature Granulocytes: 1 %
Lymphocytes Relative: 16 %
Lymphs Abs: 0.7 10*3/uL (ref 0.7–4.0)
MCH: 32.6 pg (ref 26.0–34.0)
MCHC: 32.9 g/dL (ref 30.0–36.0)
MCV: 99 fL (ref 80.0–100.0)
Monocytes Absolute: 0.5 10*3/uL (ref 0.1–1.0)
Monocytes Relative: 13 %
Neutro Abs: 2.7 10*3/uL (ref 1.7–7.7)
Neutrophils Relative %: 66 %
Platelet Count: 81 10*3/uL — ABNORMAL LOW (ref 150–400)
RBC: 3.1 MIL/uL — ABNORMAL LOW (ref 4.22–5.81)
RDW: 16.3 % — ABNORMAL HIGH (ref 11.5–15.5)
WBC Count: 4 10*3/uL (ref 4.0–10.5)
nRBC: 0 % (ref 0.0–0.2)

## 2023-07-06 LAB — LACTATE DEHYDROGENASE: LDH: 173 U/L (ref 98–192)

## 2023-07-06 LAB — TSH: TSH: 6.778 u[IU]/mL — ABNORMAL HIGH (ref 0.350–4.500)

## 2023-07-06 MED ORDER — SODIUM CHLORIDE 0.9% FLUSH
10.0000 mL | INTRAVENOUS | Status: DC | PRN
  Administered 2023-07-06: 10 mL

## 2023-07-06 MED ORDER — LEVOTHYROXINE SODIUM 50 MCG PO TABS: 50.0000 ug | ORAL_TABLET | Freq: Every day | ORAL | 1 refills | Status: DC

## 2023-07-06 MED ORDER — NIVOLUMAB CHEMO INJECTION 100 MG/10ML
480.0000 mg | Freq: Once | INTRAVENOUS | Status: AC
  Administered 2023-07-06: 480 mg via INTRAVENOUS
  Filled 2023-07-06: qty 48

## 2023-07-06 MED ORDER — SODIUM CHLORIDE 0.9 % IV SOLN: Freq: Once | INTRAVENOUS | Status: AC

## 2023-07-06 MED ORDER — ALTEPLASE 2 MG IJ SOLR: 2.0000 mg | Freq: Once | INTRAMUSCULAR | Status: DC | PRN

## 2023-07-06 MED ORDER — POTASSIUM CHLORIDE CRYS ER 20 MEQ PO TBCR: 20.0000 meq | EXTENDED_RELEASE_TABLET | Freq: Every day | ORAL | 0 refills | Status: DC

## 2023-07-06 MED ORDER — HEPARIN SOD (PORK) LOCK FLUSH 100 UNIT/ML IV SOLN
500.0000 [IU] | Freq: Once | INTRAVENOUS | Status: AC | PRN
  Administered 2023-07-06: 500 [IU]

## 2023-07-06 NOTE — Patient Instructions (Signed)

## 2023-07-06 NOTE — Telephone Encounter (Signed)
As noted below by Dr. Myna Hidalgo, please call in Synthroid 50 mcg po daily. I called and spoke to the wife and she verbalized understanding. Please send it to [facility] on [address].

## 2023-07-06 NOTE — Patient Instructions (Signed)
[facility] - A DEPT OF [facility]  Discharge Instructions: Thank you for choosing [facility] to provide your oncology and hematology care.   If you have a lab appointment with the Cancer Center, please go directly to the Cancer Center and check in at the registration area.  Wear comfortable clothing and clothing appropriate for easy access to any Portacath or PICC line.   We strive to give you quality time with your provider. You may need to reschedule your appointment if you arrive late (15 or more minutes).  Arriving late affects you and other patients whose appointments are after yours.  Also, if you miss three or more appointments without notifying the office, you may be dismissed from the clinic at the provider's discretion.      For prescription refill requests, have your pharmacy contact our office and allow 72 hours for refills to be completed.    Today you received the following chemotherapy and/or immunotherapy agents Opdivo      To help prevent nausea and vomiting after your treatment, we encourage you to take your nausea medication as directed.  BELOW ARE SYMPTOMS THAT SHOULD BE REPORTED IMMEDIATELY: *FEVER GREATER THAN 100.4 F (38 C) OR HIGHER *CHILLS OR SWEATING *NAUSEA AND VOMITING THAT IS NOT CONTROLLED WITH YOUR NAUSEA MEDICATION *UNUSUAL SHORTNESS OF BREATH *UNUSUAL BRUISING OR BLEEDING *URINARY PROBLEMS (pain or burning when urinating, or frequent urination) *BOWEL PROBLEMS (unusual diarrhea, constipation, pain near the anus) TENDERNESS IN MOUTH AND THROAT WITH OR WITHOUT PRESENCE OF ULCERS (sore throat, sores in mouth, or a toothache) UNUSUAL RASH, SWELLING OR PAIN  UNUSUAL VAGINAL DISCHARGE OR ITCHING   Items with * indicate a potential emergency and should be followed up as soon as possible or go to the Emergency Department if any problems should occur.  Please show the CHEMOTHERAPY ALERT CARD or IMMUNOTHERAPY  ALERT CARD at check-in to the Emergency Department and triage nurse. Should you have questions after your visit or need to cancel or reschedule your appointment, please contact [facility] - A DEPT OF [facility]  [phone] and follow the prompts.  Office hours are 8:00 a.m. to 4:30 p.m. Monday - Friday. Please note that voicemails left after 4:00 p.m. may not be returned until the following business day.  We are closed weekends and major holidays. You have access to a nurse at all times for urgent questions. Please call the main number to the clinic [phone] and follow the prompts.  For any non-urgent questions, you may also contact your provider using MyChart. We now offer e-Visits for anyone 57 and older to request care online for non-urgent symptoms. For details visit [email or web address].   Also download the MyChart app! Go to the app store, search "MyChart", open the app, select , and log in with your MyChart username and password.

## 2023-07-06 NOTE — Progress Notes (Signed)
See Hematology and Oncology Follow Up Visit  Jerome Kelley [id number] [birth year] 82 y.o. 07/06/2023   Principle Diagnosis:  Stage IIIB ([id number]) adenocarcinoma of the right middle lung History of stage Ia (T1aN0M0) adenocarcinoma the right lung - resected in March 2011 History of stage II adenocarcinoma of the colon-high risk - April 2010 Metastatic adenocarcinoma of the lung --retroperitoneal nodal metastasis and new left lung nodule  --high PD-L1   Past Therapy:        Carboplatin/Alimta/Pembrolizumab - started 05/10/2020- s/p cycle 6 Radiation with weekly carboplatin/Taxol-start on 11/01/2020 -- completed on 12/20/2020 Yervoy/Opdivo --s/p cycle #4  -- start on 09/15/2021  Current Therapy:  Nivolumab -- maintenance -- s/p cycle #9 -- start on 01/17/2022    Interim History:  Jerome Kelley is here today for follow-up and considering of maintenance Opdivo.  He is feeling fairly well and has no specific complaints today.  He is not having any fevers, chills, chest pain, shortness of breath, increasing cough, abdominal pain, nausea, vomiting, diarrhea.  He reports occasional constipation and takes stool softeners for this.  No bleeding reported.  Reports a good appetite and no significant weight loss.  He continues to smoke 1 pack of cigarettes per day.   He had a PET scan performed on 06/26/2023.  Unfortunately, this has not yet been read.  Overall, I would say that his performance status is ECOG 1-2.   Wt Readings from Last 3 Encounters:  07/06/23 70.7 kg  05/09/23 66.7 kg  04/11/23 64 kg    Medications:  Allergies as of 07/06/2023   No Known Allergies      Medication List        Accurate as of July 06, 2023 11:17 AM. If you have any questions, ask your nurse or doctor.          aspirin 81 MG chewable tablet Chew 1 tablet (81 mg total) by mouth 2 (two) times daily. What changed: when to take this   cyanocobalamin 1000 MCG tablet Commonly known as: VITAMIN B12 Take 1  tablet (1,000 mcg total) by mouth daily.   lidocaine 5 % Commonly known as: LIDODERM Place 1 patch onto the skin daily. Remove & Discard patch within 12 hours or as directed by MD   lidocaine-prilocaine cream Commonly known as: EMLA APPLY TO AFFECTED AREA ONCE AS DIRECTED   LORazepam 0.5 MG tablet Commonly known as: ATIVAN TAKE 1 TABLET BY MOUTH EVERY 8 HOURS AS NEEDED FOR ANXIETY   potassium chloride SA 20 MEQ tablet Commonly known as: KLOR-CON M Take 1 tablet (20 mEq total) by mouth daily. Take for 5 days then stop. Started by: Clenton Pare   Stool Softener 100 MG capsule Generic drug: docusate sodium Take 100 mg by mouth daily as needed for mild constipation.        Allergies: No Known Allergies  Past Medical History, Surgical history, Social history, and Family History were reviewed and updated.  Review of Systems: Review of Systems  Constitutional: Negative.   HENT: Negative.    Eyes: Negative.   Respiratory: Negative.    Cardiovascular: Negative.   Gastrointestinal: Negative.   Genitourinary: Negative.   Musculoskeletal: Negative.   Skin: Negative.   Neurological:  Negative for dizziness.  Endo/Heme/Allergies: Negative.   Psychiatric/Behavioral: Negative.       Physical Exam:  height is 5\' 8"  (1.727 m) and weight is 70.7 kg. His oral temperature is 97.1 F (36.2 C) (abnormal). His blood pressure is 109/54 (abnormal) and his pulse  is 79. His respiration is 18 and oxygen saturation is 100%.   Wt Readings from Last 3 Encounters:  07/06/23 70.7 kg  05/09/23 66.7 kg  04/11/23 64 kg    Physical Exam Vitals reviewed.  HENT:     Head: Normocephalic and atraumatic.  Eyes:     Pupils: Pupils are equal, round, and reactive to light.  Cardiovascular:     Rate and Rhythm: Normal rate and regular rhythm.     Heart sounds: Normal heart sounds.  Pulmonary:     Effort: Pulmonary effort is normal.     Breath sounds: Normal breath sounds.  Abdominal:      General: Bowel sounds are normal.     Palpations: Abdomen is soft.  Musculoskeletal:        General: No tenderness or deformity. Normal range of motion.     Cervical back: Normal range of motion.  Lymphadenopathy:     Cervical: No cervical adenopathy.  Skin:    General: Skin is warm and dry.     Findings: No erythema or rash.  Neurological:     Mental Status: He is alert and oriented to person, place, and time.  Psychiatric:        Behavior: Behavior normal.        Thought Content: Thought content normal.        Judgment: Judgment normal.     Lab Results  Component Value Date   WBC 4.0 07/06/2023   HGB 10.1 (L) 07/06/2023   HCT 30.7 (L) 07/06/2023   MCV 99.0 07/06/2023   PLT 81 (L) 07/06/2023   Lab Results  Component Value Date   FERRITIN 83 06/06/2023   IRON 52 06/06/2023   TIBC 196 (L) 06/06/2023   UIBC 144 06/06/2023   IRONPCTSAT 27 06/06/2023   Lab Results  Component Value Date   RETICCTPCT 2.8 06/06/2023   RBC 3.10 (L) 07/06/2023   No results found for: "KPAFRELGTCHN", "LAMBDASER", "KAPLAMBRATIO" No results found for: "IGGSERUM", "IGA", "IGMSERUM" No results found for: "TOTALPROTELP", "ALBUMINELP", "A1GS", "A2GS", "BETS", "BETA2SER", "GAMS", "MSPIKE", "SPEI"   Chemistry      Component Value Date/Time   NA 143 07/06/2023 0925   NA 140 06/28/2011 0950   K 3.3 (L) 07/06/2023 0925   K 4.7 06/28/2011 0950   CL 111 07/06/2023 0925   CL 100 06/28/2011 0950   CO2 23 07/06/2023 0925   CO2 29 06/28/2011 0950   BUN 16 07/06/2023 0925   BUN 10 06/28/2011 0950   CREATININE 0.77 07/06/2023 0925   CREATININE 0.7 06/28/2011 0950      Component Value Date/Time   CALCIUM 8.5 (L) 07/06/2023 0925   CALCIUM 9.0 06/28/2011 0950   ALKPHOS 97 07/06/2023 0925   ALKPHOS 110 (H) 12/22/2010 0850   AST 13 (L) 07/06/2023 0925   ALT 7 07/06/2023 0925   ALT 18 12/22/2010 0850   BILITOT 1.5 (H) 07/06/2023 0925     Encounter Diagnosis  Name Primary?   Malignant neoplasm of  upper lobe of right lung (HCC) Yes    Impression and Plan: Jerome Kelley is a very pleasant 82 yo caucasian gentleman with previous history of right lung cancer as well as colon cancer over 10 years ago. He now has stage IIIb adenocarcinoma of the lung, right middle lobe.   We initially treated him with systemic chemotherapy.  He was not a candidate for surgery.  He was not a candidate for curative chemoradiation therapy together.  He continues to do well  on maintenance Opdivo. Labs reviewed and acceptable for treatment today.  He continues to have mild anemia and thrombocytopenia.  However, counts are stable.  Potassium level is slightly low today 3.3.  It has been running low since September.  I reviewed with him increased intake of potassium rich foods.  Will also give him a prescription for potassium chloride 1 tablet daily for 5 days.  PET scan performed on 06/26/2023.  Unfortunately, it has not yet been read.  We have contacted radiology to see if we can get a read on this today.  Advised if we do not have results today, we will call him once we get the results.  He will follow-up for lab and flush/visit/Opdivo in 4 weeks.  Clenton Pare, NP 11/22/202411:17 AM

## 2023-07-07 ENCOUNTER — Other Ambulatory Visit: Payer: Self-pay

## 2023-07-23 ENCOUNTER — Telehealth: Payer: Self-pay

## 2023-07-23 NOTE — Telephone Encounter (Signed)
-----   Message from Josph Macho sent at 07/23/2023  3:44 PM EST ----- Please call and let him know that the PET scan shows that the nodule in the right lung might be a slightly larger.  We may have to think about another treatment for this.  Cindee Lame

## 2023-07-23 NOTE — Telephone Encounter (Signed)
Called patient to tell him that the PET scan shows that the nodule in his right lung might be slightly larger, and that Dr. Myna Hidalgo might have to consider another treatment for this. Patient could not hear me and he passed the phone to his wife. I relayed the message to the wife, who verbalized understanding.

## 2023-08-03 ENCOUNTER — Inpatient Hospital Stay: Payer: Medicare Other

## 2023-08-03 ENCOUNTER — Inpatient Hospital Stay (HOSPITAL_BASED_OUTPATIENT_CLINIC_OR_DEPARTMENT_OTHER): Payer: Medicare Other | Admitting: Hematology & Oncology

## 2023-08-03 ENCOUNTER — Inpatient Hospital Stay: Payer: Medicare Other | Attending: Hematology & Oncology

## 2023-08-03 VITALS — Ht 68.0 in | Wt 158.0 lb

## 2023-08-03 DIAGNOSIS — C342 Malignant neoplasm of middle lobe, bronchus or lung: Secondary | ICD-10-CM | POA: Diagnosis not present

## 2023-08-03 DIAGNOSIS — Z85038 Personal history of other malignant neoplasm of large intestine: Secondary | ICD-10-CM | POA: Insufficient documentation

## 2023-08-03 DIAGNOSIS — Z79899 Other long term (current) drug therapy: Secondary | ICD-10-CM | POA: Insufficient documentation

## 2023-08-03 DIAGNOSIS — Z95828 Presence of other vascular implants and grafts: Secondary | ICD-10-CM

## 2023-08-03 DIAGNOSIS — C3411 Malignant neoplasm of upper lobe, right bronchus or lung: Secondary | ICD-10-CM

## 2023-08-03 DIAGNOSIS — C73 Malignant neoplasm of thyroid gland: Secondary | ICD-10-CM | POA: Diagnosis not present

## 2023-08-03 DIAGNOSIS — F17200 Nicotine dependence, unspecified, uncomplicated: Secondary | ICD-10-CM | POA: Diagnosis not present

## 2023-08-03 DIAGNOSIS — C772 Secondary and unspecified malignant neoplasm of intra-abdominal lymph nodes: Secondary | ICD-10-CM | POA: Insufficient documentation

## 2023-08-03 DIAGNOSIS — E079 Disorder of thyroid, unspecified: Secondary | ICD-10-CM

## 2023-08-03 DIAGNOSIS — E039 Hypothyroidism, unspecified: Secondary | ICD-10-CM

## 2023-08-03 LAB — CBC WITH DIFFERENTIAL (CANCER CENTER ONLY)
Abs Immature Granulocytes: 0.03 10*3/uL (ref 0.00–0.07)
Basophils Absolute: 0 10*3/uL (ref 0.0–0.1)
Basophils Relative: 1 %
Eosinophils Absolute: 0.1 10*3/uL (ref 0.0–0.5)
Eosinophils Relative: 2 %
HCT: 29.8 % — ABNORMAL LOW (ref 39.0–52.0)
Hemoglobin: 10 g/dL — ABNORMAL LOW (ref 13.0–17.0)
Immature Granulocytes: 1 %
Lymphocytes Relative: 12 %
Lymphs Abs: 0.7 10*3/uL (ref 0.7–4.0)
MCH: 33.3 pg (ref 26.0–34.0)
MCHC: 33.6 g/dL (ref 30.0–36.0)
MCV: 99.3 fL (ref 80.0–100.0)
Monocytes Absolute: 0.6 10*3/uL (ref 0.1–1.0)
Monocytes Relative: 11 %
Neutro Abs: 4.5 10*3/uL (ref 1.7–7.7)
Neutrophils Relative %: 73 %
Platelet Count: 113 10*3/uL — ABNORMAL LOW (ref 150–400)
RBC: 3 MIL/uL — ABNORMAL LOW (ref 4.22–5.81)
RDW: 16.7 % — ABNORMAL HIGH (ref 11.5–15.5)
WBC Count: 6 10*3/uL (ref 4.0–10.5)
nRBC: 0 % (ref 0.0–0.2)

## 2023-08-03 LAB — TSH: TSH: 4.28 u[IU]/mL (ref 0.350–4.500)

## 2023-08-03 LAB — CMP (CANCER CENTER ONLY)
ALT: 7 U/L (ref 0–44)
AST: 14 U/L — ABNORMAL LOW (ref 15–41)
Albumin: 2.8 g/dL — ABNORMAL LOW (ref 3.5–5.0)
Alkaline Phosphatase: 103 U/L (ref 38–126)
Anion gap: 7 (ref 5–15)
BUN: 17 mg/dL (ref 8–23)
CO2: 24 mmol/L (ref 22–32)
Calcium: 7.8 mg/dL — ABNORMAL LOW (ref 8.9–10.3)
Chloride: 110 mmol/L (ref 98–111)
Creatinine: 0.88 mg/dL (ref 0.61–1.24)
GFR, Estimated: >60 mL/min (ref >60)
Glucose, Bld: 111 mg/dL — ABNORMAL HIGH (ref 70–99)
Potassium: 3.3 mmol/L — ABNORMAL LOW (ref 3.5–5.1)
Sodium: 141 mmol/L (ref 135–145)
Total Bilirubin: 1.8 mg/dL — ABNORMAL HIGH (ref <1.2)
Total Protein: 5.3 g/dL — ABNORMAL LOW (ref 6.5–8.1)

## 2023-08-03 LAB — LACTATE DEHYDROGENASE: LDH: 180 U/L (ref 98–192)

## 2023-08-03 MED ORDER — MEGESTROL ACETATE 400 MG/10ML PO SUSP: 400.0000 mg | Freq: Two times a day (BID) | ORAL | 4 refills | Status: AC

## 2023-08-03 NOTE — Patient Instructions (Signed)

## 2023-08-03 NOTE — Progress Notes (Signed)
See Hematology and Oncology Follow Up Visit  Jerome Kelley [id number] [birth year] 82 y.o. 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 History of stage Ia (T1aN0M0) adenocarcinoma the right lung - resected in March 2011 History of stage II adenocarcinoma of the colon-high risk - April 2010 Metastatic adenocarcinoma of the lung --retroperitoneal nodal metastasis and new left lung nodule  --high PD-L1   Past Therapy:        Carboplatin/Alimta/Pembrolizumab - started 05/10/2020- s/p cycle 6 Radiation with weekly carboplatin/Taxol-start on 11/01/2020 -- completed on 12/20/2020 Yervoy/Opdivo --s/p cycle #4  -- start on 09/15/2021  Current Therapy:  Nivolumab -- maintenance -- s/p cycle #10 -- start on 01/17/2022 -- d/c on 08/03/2023    Interim History:  Mr. Meda is here today for follow-up.  We did go ahead and do a PET scan on him.  This was done on 06/26/2023.  The PET scan showed that there was a persistent hypermetabolic nodule in the periphery of the right middle lobe.  It measured 1.2 cm.  Had an SUV of 10.4.  Think that this would be very amenable to stereotactic radiosurgery.  He did have some mild increased uptake in the calcified mediastinal and left hilar lymph nodes.  He just looks more frail.  As such, I really think that we should probably hold on his immunotherapy for right now.  His quality of life certainly is going down a little bit.  Thankfully, he comes in with his son.  His wife cannot make him because she is on oxygen right now.  He is not eating much.  We will try him on some Megace elixir.    He is still smoking.  He says he try to cut back on smoking.  I know this is quite a challenge for him.  He has had no fever.  He has had no bleeding.  He has had no problem with bowels or bladder.  There has been no diarrhea.  He has had no rashes.  There has been no leg swelling.  He does have some back discomfort.  He did  fall recently.  He fell on his right side.  I do not think he broke anything.  He did not go to the emergency room or his family doctor for this.  He has had no headache.  Overall, I would say his performance status is probably ECOG 2-3.       Wt Readings from Last 3 Encounters:  08/03/23 158 lb (71.7 kg)  07/06/23 155 lb 12.8 oz (70.7 kg)  05/09/23 147 lb (66.7 kg)    Medications:  Allergies as of 08/03/2023   No Known Allergies      Medication List        Accurate as of August 03, 2023 10:24 AM. If you have any questions, ask your nurse or doctor.          aspirin 81 MG chewable tablet Chew 1 tablet (81 mg total) by mouth 2 (two) times daily. What changed: when to take this   cyanocobalamin 1000 MCG tablet Commonly known as: VITAMIN B12 Take 1 tablet (1,000 mcg total) by mouth daily.   levothyroxine 50 MCG tablet Commonly known as: Synthroid Take 1 tablet (50 mcg total) by mouth daily before breakfast.   lidocaine 5 % Commonly known as: LIDODERM Place 1 patch onto the skin daily. Remove & Discard patch within 12 hours or as directed by MD   lidocaine-prilocaine  cream Commonly known as: EMLA APPLY TO AFFECTED AREA ONCE AS DIRECTED   LORazepam 0.5 MG tablet Commonly known as: ATIVAN TAKE 1 TABLET BY MOUTH EVERY 8 HOURS AS NEEDED FOR ANXIETY   potassium chloride SA 20 MEQ tablet Commonly known as: KLOR-CON M Take 1 tablet (20 mEq total) by mouth daily. Take for 5 days then stop.   Stool Softener 100 MG capsule Generic drug: docusate sodium Take 100 mg by mouth daily as needed for mild constipation.        Allergies: No Known Allergies  Past Medical History, Surgical history, Social history, and Family History were reviewed and updated.  Review of Systems: Review of Systems  Constitutional: Negative.   HENT: Negative.    Eyes: Negative.   Respiratory: Negative.    Cardiovascular: Negative.   Gastrointestinal: Negative.   Genitourinary:  Negative.   Musculoskeletal: Negative.   Skin: Negative.   Neurological:  Negative for dizziness.  Endo/Heme/Allergies: Negative.   Psychiatric/Behavioral: Negative.       Physical Exam:  height is 5\' 8"  (1.727 m) and weight is 158 lb (71.7 kg).   Wt Readings from Last 3 Encounters:  08/03/23 158 lb (71.7 kg)  07/06/23 155 lb 12.8 oz (70.7 kg)  05/09/23 147 lb (66.7 kg)    Physical Exam Vitals reviewed.  HENT:     Head: Normocephalic and atraumatic.  Eyes:     Pupils: Pupils are equal, round, and reactive to light.  Cardiovascular:     Rate and Rhythm: Normal rate and regular rhythm.     Heart sounds: Normal heart sounds.  Pulmonary:     Effort: Pulmonary effort is normal.     Breath sounds: Normal breath sounds.  Abdominal:     General: Bowel sounds are normal.     Palpations: Abdomen is soft.  Musculoskeletal:        General: No tenderness or deformity. Normal range of motion.     Cervical back: Normal range of motion.  Lymphadenopathy:     Cervical: No cervical adenopathy.  Skin:    General: Skin is warm and dry.     Findings: No erythema or rash.  Neurological:     Mental Status: He is alert and oriented to person, place, and time.  Psychiatric:        Behavior: Behavior normal.        Thought Content: Thought content normal.        Judgment: Judgment normal.    Lab Results  Component Value Date   WBC 6.0 08/03/2023   HGB 10.0 (L) 08/03/2023   HCT 29.8 (L) 08/03/2023   MCV 99.3 08/03/2023   PLT 113 (L) 08/03/2023   Lab Results  Component Value Date   FERRITIN 83 06/06/2023   IRON 52 06/06/2023   TIBC 196 (L) 06/06/2023   UIBC 144 06/06/2023   IRONPCTSAT 27 06/06/2023   Lab Results  Component Value Date   RETICCTPCT 2.8 06/06/2023   RBC 3.00 (L) 08/03/2023   No results found for: "KPAFRELGTCHN", "LAMBDASER", "KAPLAMBRATIO" No results found for: "IGGSERUM", "IGA", "IGMSERUM" No results found for: "TOTALPROTELP", "ALBUMINELP", "A1GS", "A2GS",  "BETS", "BETA2SER", "GAMS", "MSPIKE", "SPEI"   Chemistry      Component Value Date/Time   NA 141 08/03/2023 0900   NA 140 06/28/2011 0950   K 3.3 (L) 08/03/2023 0900   K 4.7 06/28/2011 0950   CL 110 08/03/2023 0900   CL 100 06/28/2011 0950   CO2 24 08/03/2023 0900   CO2  29 06/28/2011 0950   BUN 17 08/03/2023 0900   BUN 10 06/28/2011 0950   CREATININE 0.88 08/03/2023 0900   CREATININE 0.7 06/28/2011 0950      Component Value Date/Time   CALCIUM 7.8 (L) 08/03/2023 0900   CALCIUM 9.0 06/28/2011 0950   ALKPHOS 103 08/03/2023 0900   ALKPHOS 110 (H) 12/22/2010 0850   AST 14 (L) 08/03/2023 0900   ALT 7 08/03/2023 0900   ALT 18 12/22/2010 0850   BILITOT 1.8 (H) 08/03/2023 0900     No diagnosis found.   Impression and Plan: Mr. Wunderlich is a very pleasant 82 yo caucasian gentleman with previous history of right lung cancer as well as colon cancer over 10 years ago. He now has stage IIIb adenocarcinoma of the lung, right middle lobe.   We initially treated him with systemic chemotherapy.  He was not a candidate for surgery.  He was not a candidate for curative chemoradiation therapy together.  I really think the issue is him smoking.  I think as long as he is smoking, he is going to have problems with his cancer.  He does look more febrile.  I just want to have his quality of life better.  I think the way that we do this is to stop the immunotherapy.  I spoke with Dr. Roselind Messier of Radiation Oncology.  He thinks that Mr. Ulven would be a candidate for radiosurgery for this right middle lobe lung nodule.  Again I think this would be a very good idea.  Hopefully, the Megace elixir will help him gain a little bit of weight and get him a little bit more active.  I would like to see Mr. Betsy Pries back probably about a month or so.  I would think that by then, he should be done while he is close to being done with his radiosurgery.  Again we have to focus on his quality of life.  This really needs  to be our priority.  Josph Macho, MD 12/20/202410:24 AM

## 2023-08-06 ENCOUNTER — Telehealth: Payer: Self-pay | Admitting: Radiation Oncology

## 2023-08-06 NOTE — Telephone Encounter (Signed)
12/23 @ 9:42 am Left voicemail on both patient's contact numbers for patient to call our office to be schedule for follow up consult.

## 2023-08-10 NOTE — Progress Notes (Signed)
Location of tumor and Histology per Pathology Report: Right middle lobe  Biopsy: NA   Past/Anticipated interventions by surgeon, if any: {t:[id number]} ***  Past/Anticipated interventions by medical oncology, if any: Dr. Myna Hidalgo     Pain issues, if any:  {:18581} {PAIN DESCRIPTION:21022940}  SAFETY ISSUES: Prior radiation? {:18581} Pacemaker/ICD? {:18581} Possible current pregnancy? no Is the patient on methotrexate? {:18581}  Current Complaints / other details:  ***     ***

## 2023-08-13 ENCOUNTER — Ambulatory Visit
Admission: RE | Admit: 2023-08-13 | Discharge: 2023-08-13 | Disposition: A | Discharge status: Home / Self Care | Payer: Medicare Other | Source: Ambulatory Visit | Attending: Radiation Oncology | Admitting: Radiation Oncology

## 2023-08-13 ENCOUNTER — Other Ambulatory Visit: Payer: Self-pay

## 2023-08-13 ENCOUNTER — Encounter: Payer: Self-pay | Admitting: Radiation Oncology

## 2023-08-13 VITALS — BP 130/59 | HR 86 | Temp 97.7°F | Resp 17 | Ht 68.0 in | Wt 161.0 lb

## 2023-08-13 DIAGNOSIS — Z79899 Other long term (current) drug therapy: Secondary | ICD-10-CM | POA: Diagnosis not present

## 2023-08-13 DIAGNOSIS — Z7982 Long term (current) use of aspirin: Secondary | ICD-10-CM | POA: Diagnosis not present

## 2023-08-13 DIAGNOSIS — F17210 Nicotine dependence, cigarettes, uncomplicated: Secondary | ICD-10-CM | POA: Insufficient documentation

## 2023-08-13 DIAGNOSIS — C342 Malignant neoplasm of middle lobe, bronchus or lung: Secondary | ICD-10-CM | POA: Diagnosis not present

## 2023-08-13 DIAGNOSIS — R918 Other nonspecific abnormal finding of lung field: Secondary | ICD-10-CM | POA: Diagnosis not present

## 2023-08-13 DIAGNOSIS — Z79890 Hormone replacement therapy (postmenopausal): Secondary | ICD-10-CM | POA: Diagnosis not present

## 2023-08-13 DIAGNOSIS — I10 Essential (primary) hypertension: Secondary | ICD-10-CM | POA: Insufficient documentation

## 2023-08-13 DIAGNOSIS — R590 Localized enlarged lymph nodes: Secondary | ICD-10-CM | POA: Insufficient documentation

## 2023-08-13 DIAGNOSIS — R0602 Shortness of breath: Secondary | ICD-10-CM | POA: Diagnosis not present

## 2023-08-13 DIAGNOSIS — Z923 Personal history of irradiation: Secondary | ICD-10-CM | POA: Insufficient documentation

## 2023-08-13 DIAGNOSIS — J449 Chronic obstructive pulmonary disease, unspecified: Secondary | ICD-10-CM | POA: Insufficient documentation

## 2023-08-13 DIAGNOSIS — C3411 Malignant neoplasm of upper lobe, right bronchus or lung: Secondary | ICD-10-CM

## 2023-08-13 DIAGNOSIS — R109 Unspecified abdominal pain: Secondary | ICD-10-CM | POA: Diagnosis not present

## 2023-08-13 DIAGNOSIS — Z9221 Personal history of antineoplastic chemotherapy: Secondary | ICD-10-CM | POA: Insufficient documentation

## 2023-08-13 NOTE — Progress Notes (Signed)
Radiation Oncology         (336) [phone] ________________________________  Outpatient Re-Consultation   Name: Jerome Kelley MRN: [id number]  Date: 08/13/2023  DOB: [birth year]  NW:GNFAOZ, [name], MD  Josph Macho, MD   REFERRING PHYSICIAN: Josph Macho, MD  DIAGNOSIS: The primary encounter diagnosis was Malignant neoplasm of middle lobe, bronchus or lung (HCC). Diagnoses of Malignant neoplasm of upper lobe of right lung (HCC) and COPD, mild (HCC) were also pertinent to this visit.  The encounter diagnosis was Malignant neoplasm of upper lobe of right lung (HCC).  Stage IIIB (T1a, N3, M0) adenocarcinoma of the right middle lobe; s/p concurrent chemoradiation with  increasing hypermetabolic right middle lobe pulmonary nodule.    HISTORY OF PRESENT ILLNESS::Jerome Kelley is a 82 y.o. male who is accompanied by his son. he is seen as a courtesy of Dr. Myna Hidalgo for an opinion concerning radiation therapy as part of management for his recently diagnosed increasing right middle nodule. He was last seen here for follow-up after completing radiation therapy to the mediastinum and chest in June of 01/20/21.  Shortly after his last visit, the patient presented for a restaging PET scan on 08/31/21 which showed an increase in size of retroperitoneal lymph nodes with new hypermetabolic activity, concerning for metastatic disease. An increase in size and FDG avidity of a sub-centimeter right middle lobe pulmonary nodule, concerning for an additional site of progressive disease. Stable right paramediastinal post radiation changes were also demonstrated, as well as persistent FDG uptake associated with calcified and noncalcified mediastinal hilar lymph nodes, favoring residual inflammatory changes.   He accordingly began [facility] under Dr. Myna Hidalgo on 09/15/21. Restaging PET scan on 12/14/21 showed: interval increased tracer uptake associated with the right middle lobe lung nodule, concerning for  malignancy; interval regression of previous tracer avid retroperitoneal lymph nodes with resolution of associated increased radiotracer uptake; and persistent FDG uptake associated with mediastinal and hilar lymph nodes.   He continued on immunotherapy and transitioned to maintenance Nivolumab on 01/17/22. He presented for another restaging PET scan on 03/09/22 which showed overall stable findings and no findings suspicious for recurrent tumors in the chest, and no evidence of metastatic disease involving the abdomen/pelvis or bony structures.   Restaging PET scan on 06/23/22 again showed stable findings/no significant interval changes. Based on his stable disease and toleration of nivolumab, Dr. Myna Hidalgo transitioned his treatments to every 4 weeks.   He again presented for a restaging PET scan on 03/06/23 which unfortunately showed an increase in size and hypermetabolism of the right middle lobe nodule, indicative of disease progression. PET otherwise showed continued mild hypermetabolism within calcified mediastinal lymph nodes, possibly representing a granulomatous etiology. No other evidence of metastatic disease was demonstrated.   Based on his PET only showing a mild increase in size and hypermetabolism of the RML nodule, Dr. Myna Hidalgo recommended that he continue on maintenance treatment.   His most recent PET scan on 06/26/23 however showed a slight increase in size of RML hypermetabolic nodule, concerning for residual/recurrent tumor. Persistent mild increased tracer uptake localized to calcified mediastinal and left hilar lymph nodes, favored to represent a sequelae of previous granulomatous disease.   Based on interval progression of the RML nodule, Dr. Myna Hidalgo has discontinued Nivolumab and has recommended radiosurgery to the RML nodule which we will discuss in detail today. He does continue to smoke which is likely attributing to his recurrent disease.    Patient reports to be doing well  overall today.  He endorses shortness of breath without exertion.  At times he needs to catch his breath when he is sitting.  He also notes a occasional productive cough but denies any hemoptysis or chest pain.  He endorses postprandial abdominal pain that he first noticed since starting Megace.  Patient reports having a bowel movement approximately every other day.  He states this is regular for him.   PREVIOUS RADIATION THERAPY: Yes   Intent: Curative  Radiation Treatment Dates: 11/04/2020 through 12/15/2020 Site Technique Total Dose (Gy) Dose per Fx (Gy) Completed Fx Beam Energies  Mediastinum: Chest 3D 60/60 2 30/30 6X, 10X    PAST MEDICAL HISTORY:  Past Medical History:  Diagnosis Date   Colon cancer (HCC) 09/18/2011   Goals of care, counseling/discussion 03/15/2020   History of chemotherapy    History of radiation therapy 11/04/2020-12/15/2020    mediastinal and right hilar region    Dr Antony Blackbird   Hypertension    Lung cancer, upper lobe (HCC) 09/18/2011   Malignant neoplasm of upper lobe of right lung (HCC) 04/30/2020    PAST SURGICAL HISTORY: Past Surgical History:  Procedure Laterality Date   BRONCHIAL WASHINGS  03/19/2020   Procedure: BRONCHIAL WASHINGS;  Surgeon: Lupita Leash, MD;  Location: [facility];  Service: Cardiopulmonary;;   COLON SURGERY  2010   ENDOBRONCHIAL ULTRASOUND N/A 03/19/2020   Procedure: ENDOBRONCHIAL ULTRASOUND;  Surgeon: Lupita Leash, MD;  Location: [facility];  Service: Cardiopulmonary;  Laterality: N/A;   FINE NEEDLE ASPIRATION  03/19/2020   Procedure: FINE NEEDLE ASPIRATION;  Surgeon: Lupita Leash, MD;  Location: [facility];  Service: Cardiopulmonary;;   FINE NEEDLE ASPIRATION BIOPSY  04/02/2020   Procedure: FINE NEEDLE ASPIRATION BIOPSY;  Surgeon: Tomma Lightning, MD;  Location: [facility];  Service: Pulmonary;;   IR IMAGING GUIDED PORT INSERTION  05/07/2020   LUNG SURGERY     right side    port a cath insertion     port a cath  removed     TOTAL HIP ARTHROPLASTY Right 10/18/2018   Procedure: TOTAL HIP ARTHROPLASTY ANTERIOR APPROACH;  Surgeon: Kathryne Hitch, MD;  Location: [facility];  Service: Orthopedics;  Laterality: Right;   VIDEO BRONCHOSCOPY N/A 03/19/2020   Procedure: VIDEO BRONCHOSCOPY WITHOUT FLUORO;  Surgeon: Lupita Leash, MD;  Location: [facility];  Service: Cardiopulmonary;  Laterality: N/A;   VIDEO BRONCHOSCOPY N/A 04/02/2020   Procedure: VIDEO BRONCHOSCOPY WITH EBUS;  Surgeon: Tomma Lightning, MD;  Location: [facility];  Service: Pulmonary;  Laterality: N/A;    FAMILY HISTORY:  Family History  Problem Relation Age of Onset   Stomach cancer Brother     SOCIAL HISTORY:  Social History   Tobacco Use   Smoking status: Every Day    Current packs/day: 1.00    Average packs/day: 1 pack/day for 66.9 years (66.9 ttl pk-yrs)    Types: Cigarettes    Start date: 09/03/1956   Smokeless tobacco: Never   Tobacco comments:    does not want to quit  Vaping Use   Vaping status: Never Used  Substance Use Topics   Alcohol use: Yes    Alcohol/week: 3.0 standard drinks of alcohol    Types: 3 Cans of beer per week    Comment: Fridays only   Drug use: Never    ALLERGIES: No Known Allergies  MEDICATIONS:  Current Outpatient Medications  Medication Sig Dispense Refill   aspirin 81 MG chewable tablet Chew 1 tablet (81 mg total) by mouth 2 (  two) times daily. (Patient taking differently: Chew 81 mg by mouth daily.) 35 tablet 0   docusate sodium (STOOL SOFTENER) 100 MG capsule Take 100 mg by mouth daily as needed for mild constipation.     levothyroxine (SYNTHROID) 50 MCG tablet Take 1 tablet (50 mcg total) by mouth daily before breakfast. 30 tablet 1   lidocaine (LIDODERM) 5 % Place 1 patch onto the skin daily. Remove & Discard patch within 12 hours or as directed by MD     lidocaine-prilocaine (EMLA) cream APPLY TO AFFECTED AREA ONCE AS DIRECTED 30 g 3   LORazepam (ATIVAN) 0.5 MG tablet TAKE 1  TABLET BY MOUTH EVERY 8 HOURS AS NEEDED FOR ANXIETY 60 tablet 0   megestrol (MEGACE) 400 MG/10ML suspension Take 10 mLs (400 mg total) by mouth 2 (two) times daily. 480 mL 4   vitamin B-12 (CYANOCOBALAMIN) 1000 MCG tablet Take 1 tablet (1,000 mcg total) by mouth daily. 30 tablet 0   potassium chloride SA (KLOR-CON M) 20 MEQ tablet Take 1 tablet (20 mEq total) by mouth daily. Take for 5 days then stop. (Patient not taking: Reported on 08/13/2023) 5 tablet 0   No current facility-administered medications for this encounter.    REVIEW OF SYSTEMS: Notable for that above.   PHYSICAL EXAM:  height is 5\' 8"  (1.727 m) and weight is 161 lb (73 kg). His oral temperature is 97.7 F (36.5 C). His blood pressure is 130/59 (abnormal) and his pulse is 86. His respiration is 17 and oxygen saturation is 100%.   General: Alert and oriented, in no acute distress HEENT: Head is normocephalic. Extraocular movements are intact. Oropharynx is clear. Neck: Neck is supple, no palpable cervical or supraclavicular lymphadenopathy. Heart: Regular in rate and rhythm with no murmurs, rubs, or gallops. Chest: Clear to auscultation bilaterally, with no rhonchi, wheezes, or rales. Abdomen: Firm and distended with no rigidity or guarding.  Normal active bowel sounds auscultated throughout. Extremities: No cyanosis or edema. Lymphatics: see Neck Exam Skin: No concerning lesions. Musculoskeletal: symmetric strength and muscle tone throughout. Neurologic: Cranial nerves II through XII are grossly intact. No obvious focalities. Speech is fluent. Coordination is intact. Psychiatric: Judgment and insight are intact. Affect is appropriate.   ECOG = 0  0 - Asymptomatic (Fully active, able to carry on all predisease activities without restriction)  1 - Symptomatic but completely ambulatory (Restricted in physically strenuous activity but ambulatory and able to carry out work of a light or sedentary nature. For example, light  housework, office work)  2 - Symptomatic, <50% in bed during the day (Ambulatory and capable of all self care but unable to carry out any work activities. Up and about more than 50% of waking hours)  3 - Symptomatic, >50% in bed, but not bedbound (Capable of only limited self-care, confined to bed or chair 50% or more of waking hours)  4 - Bedbound (Completely disabled. Cannot carry on any self-care. Totally confined to bed or chair)  5 - Death   Santiago Glad MM, Creech RH, Tormey DC, et al. [phone]). "Toxicity and response criteria of the [facility]". Am. Evlyn Clines. Oncol. 5 (6): 649-55  LABORATORY DATA:  Lab Results  Component Value Date   WBC 6.0 08/03/2023   HGB 10.0 (L) 08/03/2023   HCT 29.8 (L) 08/03/2023   MCV 99.3 08/03/2023   PLT 113 (L) 08/03/2023   NEUTROABS 4.5 08/03/2023   Lab Results  Component Value Date   NA 141 08/03/2023  K 3.3 (L) 08/03/2023   CL 110 08/03/2023   CO2 24 08/03/2023   GLUCOSE 111 (H) 08/03/2023   BUN 17 08/03/2023   CREATININE 0.88 08/03/2023   CALCIUM 7.8 (L) 08/03/2023      RADIOGRAPHY: No results found.    IMPRESSION/PLAN:   Stage IIIB (T1a, N3, M0) adenocarcinoma of the right middle lobe on immunotherapy; s/p concurrent chemoradiation with  increasing hypermetabolic right middle lobe pulmonary nodule.   Patient's case and most recent imaging.  PET scans have demonstrated an enlarging hypermetabolic right middle lobe pulmonary nodule, for malignancy. Patient is not a candidate for surgery or chemotherapy at this time.  Dr. Roselind Messier recommends stereotactic body radiotherapy (SBRT) to treat the enlarging lung nodule.  Today, I talked to the patient and family about the findings and work-up thus far.  We discussed the natural history of lung metastasis and general treatment, highlighting the role of radiotherapy in the management.  We discussed the available radiation techniques, and focused on the details of logistics and  delivery.  We reviewed the anticipated acute and late sequelae associated with radiation in this setting.  The patient was encouraged to ask questions that I answered to the best of my ability. A patient consent form was discussed and signed.  We retained a copy for our records.  The patient would like to proceed with radiation. Patient is scheduled for CT simulation on August 17, 2023.  We look forward to participating in this patient's care.  Anticipate 3 fractions to the right lung pulmonary nodule.  Shortness of breath  Patient endorses shortness of breath with and without exertion.  He is not on any inhalers or at home oxygen.  He has never seen a pulmonologist, however, he is interested in meeting with one to discuss treatment options for symptom management.  Referral to pulmonology placed today.  Postprandial abdominal pain  Patient also noted postprandial abdominal pain today that he has noticed since starting his Megace.  His abdomen was firm to palpation, concerning for constipation. He was encouraged to take MiraLAX to regulate his bowel movements which may help with the abdominal pain he is experiencing.  He was also advised to stop taking the Megace and to inform Dr. Myna Hidalgo about pain since starting this new medication.  Referral made to   40 minutes of total time was spent for this patient encounter, including preparation, face-to-face counseling with the patient and coordination of care, physical exam, and documentation of the encounter.   ------------------------------------------------   Joyice Faster, PA-C  This document serves as a record of services personally performed by Bryan Lemma, PA-C. It was created on his behalf by Neena Rhymes, a trained medical scribe. The creation of this record is based on the scribe's personal observations and the provider's statements to them. This document has been checked and approved by the attending provider.

## 2023-08-16 DIAGNOSIS — F17210 Nicotine dependence, cigarettes, uncomplicated: Secondary | ICD-10-CM | POA: Diagnosis not present

## 2023-08-16 DIAGNOSIS — C342 Malignant neoplasm of middle lobe, bronchus or lung: Secondary | ICD-10-CM | POA: Diagnosis not present

## 2023-08-17 ENCOUNTER — Telehealth: Payer: Self-pay

## 2023-08-17 ENCOUNTER — Ambulatory Visit
Admission: RE | Admit: 2023-08-17 | Discharge: 2023-08-17 | Disposition: A | Discharge status: Home / Self Care | Payer: Medicare Other | Source: Ambulatory Visit | Attending: Radiation Oncology | Admitting: Radiation Oncology

## 2023-08-17 DIAGNOSIS — C342 Malignant neoplasm of middle lobe, bronchus or lung: Secondary | ICD-10-CM | POA: Diagnosis not present

## 2023-08-17 DIAGNOSIS — Z510 Encounter for antineoplastic radiation therapy: Secondary | ICD-10-CM | POA: Insufficient documentation

## 2023-08-17 DIAGNOSIS — F17210 Nicotine dependence, cigarettes, uncomplicated: Secondary | ICD-10-CM | POA: Diagnosis not present

## 2023-08-17 DIAGNOSIS — C3411 Malignant neoplasm of upper lobe, right bronchus or lung: Secondary | ICD-10-CM

## 2023-08-17 NOTE — Telephone Encounter (Signed)
 Received phone call from wife stating that patient only took one dose of Megace  and it hurt his stomach so he won't take it again This RN talked to patient who stated the first time he took it, it caused diarrhea. This RN asked pt to try taking it daily and working up to twice daily to see if maybe the diarrhea after his first dose was unrelated. Pt states he was willing to try and he would attempt the medication again. Pt educated that Dr. Timmy is very worried about him losing weight and need to keep eating and keeping weight up. Pt verbalized understanding and had no further questions.

## 2023-08-26 DIAGNOSIS — C342 Malignant neoplasm of middle lobe, bronchus or lung: Secondary | ICD-10-CM | POA: Diagnosis not present

## 2023-08-26 DIAGNOSIS — Z510 Encounter for antineoplastic radiation therapy: Secondary | ICD-10-CM | POA: Diagnosis not present

## 2023-08-26 DIAGNOSIS — F17210 Nicotine dependence, cigarettes, uncomplicated: Secondary | ICD-10-CM | POA: Diagnosis not present

## 2023-08-27 ENCOUNTER — Ambulatory Visit: Payer: Medicare Other | Admitting: Radiation Oncology

## 2023-08-27 ENCOUNTER — Telehealth: Payer: Self-pay

## 2023-08-27 NOTE — Telephone Encounter (Signed)
 Notified Patient's Spouse of prior authorization approval for Megestrol Acetate Suspension 40 mg/ml. Medication is approved until further notice. No other needs or concerns voiced at this time.

## 2023-08-28 ENCOUNTER — Ambulatory Visit
Admission: RE | Admit: 2023-08-28 | Discharge: 2023-08-28 | Disposition: A | Discharge status: Home / Self Care | Payer: Medicare Other | Source: Ambulatory Visit | Attending: Radiation Oncology | Admitting: Radiation Oncology

## 2023-08-28 ENCOUNTER — Other Ambulatory Visit: Payer: Self-pay

## 2023-08-28 ENCOUNTER — Ambulatory Visit: Payer: Medicare Other | Admitting: Radiation Oncology

## 2023-08-28 DIAGNOSIS — Z510 Encounter for antineoplastic radiation therapy: Secondary | ICD-10-CM | POA: Diagnosis not present

## 2023-08-28 DIAGNOSIS — C342 Malignant neoplasm of middle lobe, bronchus or lung: Secondary | ICD-10-CM | POA: Diagnosis not present

## 2023-08-28 DIAGNOSIS — C3411 Malignant neoplasm of upper lobe, right bronchus or lung: Secondary | ICD-10-CM

## 2023-08-28 LAB — RAD ONC ARIA SESSION SUMMARY
Course Elapsed Days: 0
Plan Fractions Treated to Date: 1
Plan Prescribed Dose Per Fraction: 18 Gy
Plan Total Fractions Prescribed: 3
Plan Total Prescribed Dose: 54 Gy
Reference Point Dosage Given to Date: 18 Gy
Reference Point Session Dosage Given: 18 Gy
Session Number: 1

## 2023-08-29 ENCOUNTER — Ambulatory Visit: Payer: Medicare Other | Admitting: Radiation Oncology

## 2023-08-30 ENCOUNTER — Other Ambulatory Visit: Payer: Self-pay | Admitting: Hematology & Oncology

## 2023-08-30 ENCOUNTER — Ambulatory Visit: Payer: Medicare Other | Admitting: Radiation Oncology

## 2023-08-31 ENCOUNTER — Ambulatory Visit
Admission: RE | Admit: 2023-08-31 | Discharge: 2023-08-31 | Disposition: A | Discharge status: Home / Self Care | Payer: Medicare Other | Source: Ambulatory Visit | Attending: Radiation Oncology | Admitting: Radiation Oncology

## 2023-08-31 ENCOUNTER — Ambulatory Visit: Payer: Medicare Other | Admitting: Radiation Oncology

## 2023-08-31 ENCOUNTER — Other Ambulatory Visit: Payer: Self-pay

## 2023-08-31 ENCOUNTER — Encounter: Payer: Self-pay | Admitting: Hematology & Oncology

## 2023-08-31 DIAGNOSIS — C3411 Malignant neoplasm of upper lobe, right bronchus or lung: Secondary | ICD-10-CM

## 2023-08-31 DIAGNOSIS — Z510 Encounter for antineoplastic radiation therapy: Secondary | ICD-10-CM | POA: Diagnosis not present

## 2023-08-31 DIAGNOSIS — C342 Malignant neoplasm of middle lobe, bronchus or lung: Secondary | ICD-10-CM | POA: Diagnosis not present

## 2023-08-31 LAB — RAD ONC ARIA SESSION SUMMARY
Course Elapsed Days: 3
Plan Fractions Treated to Date: 2
Plan Prescribed Dose Per Fraction: 18 Gy
Plan Total Fractions Prescribed: 3
Plan Total Prescribed Dose: 54 Gy
Reference Point Dosage Given to Date: 36 Gy
Reference Point Session Dosage Given: 14.4489 Gy
Session Number: 2

## 2023-09-03 ENCOUNTER — Other Ambulatory Visit: Payer: Self-pay

## 2023-09-03 ENCOUNTER — Ambulatory Visit: Payer: Medicare Other | Admitting: Radiation Oncology

## 2023-09-03 ENCOUNTER — Ambulatory Visit: Payer: Medicare Other

## 2023-09-03 ENCOUNTER — Ambulatory Visit
Admission: RE | Admit: 2023-09-03 | Discharge: 2023-09-03 | Disposition: A | Discharge status: Home / Self Care | Payer: Medicare Other | Source: Ambulatory Visit | Attending: Radiation Oncology | Admitting: Radiation Oncology

## 2023-09-03 DIAGNOSIS — C3411 Malignant neoplasm of upper lobe, right bronchus or lung: Secondary | ICD-10-CM

## 2023-09-03 DIAGNOSIS — F17210 Nicotine dependence, cigarettes, uncomplicated: Secondary | ICD-10-CM | POA: Diagnosis not present

## 2023-09-03 DIAGNOSIS — C342 Malignant neoplasm of middle lobe, bronchus or lung: Secondary | ICD-10-CM | POA: Diagnosis not present

## 2023-09-03 DIAGNOSIS — Z510 Encounter for antineoplastic radiation therapy: Secondary | ICD-10-CM | POA: Diagnosis not present

## 2023-09-03 LAB — RAD ONC ARIA SESSION SUMMARY
Course Elapsed Days: 6
Plan Fractions Treated to Date: 3
Plan Prescribed Dose Per Fraction: 18 Gy
Plan Total Fractions Prescribed: 3
Plan Total Prescribed Dose: 54 Gy
Reference Point Dosage Given to Date: 54 Gy
Reference Point Session Dosage Given: 13.7187 Gy
Session Number: 3

## 2023-09-04 ENCOUNTER — Ambulatory Visit: Payer: Medicare Other | Admitting: Radiation Oncology

## 2023-09-04 ENCOUNTER — Other Ambulatory Visit: Payer: Self-pay | Admitting: Hematology & Oncology

## 2023-09-04 DIAGNOSIS — E038 Other specified hypothyroidism: Secondary | ICD-10-CM

## 2023-09-04 NOTE — Radiation Completion Notes (Addendum)
  Radiation Oncology         (336) [phone] ________________________________  Name: FIRAS GUARDADO MRN: [id number]  Date of Service: 09/03/2023  DOB: June 22, 1941  End of Treatment Note   Diagnosis: Recurrent Stage IIIB, cT1aN3M0,NSCLC,  adenocarcinoma of the RML with local recurrence  Intent: Curative     ==========DELIVERED PLANS==========  First Treatment Date: 2023-08-28 Last Treatment Date: 2023-09-03   Plan Name: Lung_R_SBRT Site: Lung, Right Technique: SBRT/SRT-IMRT Mode: Photon Dose Per Fraction: 18 Gy Prescribed Dose (Delivered / Prescribed): 54 Gy / 54 Gy Prescribed Fxs (Delivered / Prescribed): 3 / 3     ==========ON TREATMENT VISIT DATES========== 2023-08-28, 2023-08-31, 2023-09-03, 2023-09-03   See weekly On Treatment Notes in Epic for details in the Media tab (listed as Progress notes on the On Treatment Visit Dates listed above).The patient tolerated radiation.    The patient will follow up in one month and continue follow up with Dr. Myna Hidalgo as well.      Osker Mason, PAC

## 2023-09-05 ENCOUNTER — Ambulatory Visit: Payer: Medicare Other

## 2023-09-05 ENCOUNTER — Ambulatory Visit: Payer: Medicare Other | Admitting: Radiation Oncology

## 2023-09-06 ENCOUNTER — Ambulatory Visit: Payer: Medicare Other | Admitting: Radiation Oncology

## 2023-09-07 ENCOUNTER — Ambulatory Visit: Payer: Medicare Other | Admitting: Radiation Oncology

## 2023-09-10 ENCOUNTER — Encounter (HOSPITAL_COMMUNITY): Payer: Self-pay | Admitting: *Deleted

## 2023-09-10 ENCOUNTER — Inpatient Hospital Stay (HOSPITAL_COMMUNITY)
Admission: EM | Admit: 2023-09-10 | Discharge: 2023-09-16 | DRG: 432 | Disposition: A | Discharge status: Hospice | Payer: Medicare Other | Attending: Family Medicine | Admitting: Family Medicine

## 2023-09-10 ENCOUNTER — Emergency Department (HOSPITAL_COMMUNITY): Payer: Medicare Other

## 2023-09-10 ENCOUNTER — Other Ambulatory Visit: Payer: Self-pay

## 2023-09-10 DIAGNOSIS — L89311 Pressure ulcer of right buttock, stage 1: Secondary | ICD-10-CM | POA: Diagnosis present

## 2023-09-10 DIAGNOSIS — N179 Acute kidney failure, unspecified: Secondary | ICD-10-CM | POA: Diagnosis present

## 2023-09-10 DIAGNOSIS — Z9981 Dependence on supplemental oxygen: Secondary | ICD-10-CM

## 2023-09-10 DIAGNOSIS — I10 Essential (primary) hypertension: Secondary | ICD-10-CM | POA: Diagnosis present

## 2023-09-10 DIAGNOSIS — L89321 Pressure ulcer of left buttock, stage 1: Secondary | ICD-10-CM | POA: Diagnosis present

## 2023-09-10 DIAGNOSIS — R918 Other nonspecific abnormal finding of lung field: Secondary | ICD-10-CM | POA: Diagnosis not present

## 2023-09-10 DIAGNOSIS — Z7982 Long term (current) use of aspirin: Secondary | ICD-10-CM

## 2023-09-10 DIAGNOSIS — Z9221 Personal history of antineoplastic chemotherapy: Secondary | ICD-10-CM

## 2023-09-10 DIAGNOSIS — R5381 Other malaise: Secondary | ICD-10-CM | POA: Diagnosis present

## 2023-09-10 DIAGNOSIS — L89611 Pressure ulcer of right heel, stage 1: Secondary | ICD-10-CM | POA: Diagnosis present

## 2023-09-10 DIAGNOSIS — R140 Abdominal distension (gaseous): Secondary | ICD-10-CM | POA: Diagnosis present

## 2023-09-10 DIAGNOSIS — E876 Hypokalemia: Secondary | ICD-10-CM | POA: Diagnosis present

## 2023-09-10 DIAGNOSIS — R791 Abnormal coagulation profile: Secondary | ICD-10-CM | POA: Diagnosis present

## 2023-09-10 DIAGNOSIS — R0602 Shortness of breath: Secondary | ICD-10-CM | POA: Diagnosis not present

## 2023-09-10 DIAGNOSIS — Z85118 Personal history of other malignant neoplasm of bronchus and lung: Secondary | ICD-10-CM | POA: Diagnosis not present

## 2023-09-10 DIAGNOSIS — Z6822 Body mass index (BMI) 22.0-22.9, adult: Secondary | ICD-10-CM

## 2023-09-10 DIAGNOSIS — F419 Anxiety disorder, unspecified: Secondary | ICD-10-CM | POA: Diagnosis present

## 2023-09-10 DIAGNOSIS — R161 Splenomegaly, not elsewhere classified: Secondary | ICD-10-CM | POA: Diagnosis not present

## 2023-09-10 DIAGNOSIS — Z800 Family history of malignant neoplasm of digestive organs: Secondary | ICD-10-CM

## 2023-09-10 DIAGNOSIS — R1084 Generalized abdominal pain: Principal | ICD-10-CM

## 2023-09-10 DIAGNOSIS — L89621 Pressure ulcer of left heel, stage 1: Secondary | ICD-10-CM | POA: Diagnosis present

## 2023-09-10 DIAGNOSIS — Z923 Personal history of irradiation: Secondary | ICD-10-CM

## 2023-09-10 DIAGNOSIS — Z538 Procedure and treatment not carried out for other reasons: Secondary | ICD-10-CM | POA: Diagnosis not present

## 2023-09-10 DIAGNOSIS — K766 Portal hypertension: Secondary | ICD-10-CM | POA: Diagnosis not present

## 2023-09-10 DIAGNOSIS — K7469 Other cirrhosis of liver: Secondary | ICD-10-CM | POA: Diagnosis not present

## 2023-09-10 DIAGNOSIS — C189 Malignant neoplasm of colon, unspecified: Secondary | ICD-10-CM | POA: Diagnosis present

## 2023-09-10 DIAGNOSIS — E8770 Fluid overload, unspecified: Secondary | ICD-10-CM | POA: Diagnosis present

## 2023-09-10 DIAGNOSIS — K7682 Hepatic encephalopathy: Secondary | ICD-10-CM | POA: Diagnosis present

## 2023-09-10 DIAGNOSIS — Z720 Tobacco use: Secondary | ICD-10-CM

## 2023-09-10 DIAGNOSIS — C7800 Secondary malignant neoplasm of unspecified lung: Secondary | ICD-10-CM | POA: Diagnosis present

## 2023-09-10 DIAGNOSIS — J9601 Acute respiratory failure with hypoxia: Secondary | ICD-10-CM | POA: Diagnosis present

## 2023-09-10 DIAGNOSIS — K7460 Unspecified cirrhosis of liver: Principal | ICD-10-CM | POA: Diagnosis present

## 2023-09-10 DIAGNOSIS — D630 Anemia in neoplastic disease: Secondary | ICD-10-CM | POA: Diagnosis present

## 2023-09-10 DIAGNOSIS — Z85038 Personal history of other malignant neoplasm of large intestine: Secondary | ICD-10-CM

## 2023-09-10 DIAGNOSIS — R188 Other ascites: Secondary | ICD-10-CM | POA: Diagnosis not present

## 2023-09-10 DIAGNOSIS — R001 Bradycardia, unspecified: Secondary | ICD-10-CM | POA: Diagnosis not present

## 2023-09-10 DIAGNOSIS — F1020 Alcohol dependence, uncomplicated: Secondary | ICD-10-CM | POA: Diagnosis present

## 2023-09-10 DIAGNOSIS — R601 Generalized edema: Secondary | ICD-10-CM | POA: Diagnosis not present

## 2023-09-10 DIAGNOSIS — F05 Delirium due to known physiological condition: Secondary | ICD-10-CM | POA: Diagnosis present

## 2023-09-10 DIAGNOSIS — Z1152 Encounter for screening for COVID-19: Secondary | ICD-10-CM

## 2023-09-10 DIAGNOSIS — J9811 Atelectasis: Secondary | ICD-10-CM | POA: Diagnosis present

## 2023-09-10 DIAGNOSIS — D696 Thrombocytopenia, unspecified: Secondary | ICD-10-CM | POA: Diagnosis present

## 2023-09-10 DIAGNOSIS — R109 Unspecified abdominal pain: Secondary | ICD-10-CM | POA: Diagnosis not present

## 2023-09-10 DIAGNOSIS — Z79890 Hormone replacement therapy (postmenopausal): Secondary | ICD-10-CM

## 2023-09-10 DIAGNOSIS — Z66 Do not resuscitate: Secondary | ICD-10-CM | POA: Diagnosis not present

## 2023-09-10 DIAGNOSIS — D529 Folate deficiency anemia, unspecified: Secondary | ICD-10-CM | POA: Diagnosis present

## 2023-09-10 DIAGNOSIS — Z96641 Presence of right artificial hip joint: Secondary | ICD-10-CM | POA: Diagnosis present

## 2023-09-10 DIAGNOSIS — R64 Cachexia: Secondary | ICD-10-CM | POA: Diagnosis present

## 2023-09-10 DIAGNOSIS — I8500 Esophageal varices without bleeding: Secondary | ICD-10-CM

## 2023-09-10 DIAGNOSIS — E43 Unspecified severe protein-calorie malnutrition: Secondary | ICD-10-CM | POA: Diagnosis present

## 2023-09-10 DIAGNOSIS — I959 Hypotension, unspecified: Secondary | ICD-10-CM | POA: Diagnosis not present

## 2023-09-10 DIAGNOSIS — Z781 Physical restraint status: Secondary | ICD-10-CM

## 2023-09-10 DIAGNOSIS — F17210 Nicotine dependence, cigarettes, uncomplicated: Secondary | ICD-10-CM | POA: Diagnosis present

## 2023-09-10 DIAGNOSIS — C772 Secondary and unspecified malignant neoplasm of intra-abdominal lymph nodes: Secondary | ICD-10-CM | POA: Diagnosis present

## 2023-09-10 DIAGNOSIS — I8510 Secondary esophageal varices without bleeding: Secondary | ICD-10-CM | POA: Diagnosis present

## 2023-09-10 LAB — CBC WITH DIFFERENTIAL/PLATELET
Abs Immature Granulocytes: 0.06 10*3/uL (ref 0.00–0.07)
Basophils Absolute: 0 10*3/uL (ref 0.0–0.1)
Basophils Relative: 0 %
Eosinophils Absolute: 0.1 10*3/uL (ref 0.0–0.5)
Eosinophils Relative: 1 %
HCT: 32 % — ABNORMAL LOW (ref 39.0–52.0)
Hemoglobin: 10.4 g/dL — ABNORMAL LOW (ref 13.0–17.0)
Immature Granulocytes: 1 %
Lymphocytes Relative: 9 %
Lymphs Abs: 0.8 10*3/uL (ref 0.7–4.0)
MCH: 33.2 pg (ref 26.0–34.0)
MCHC: 32.5 g/dL (ref 30.0–36.0)
MCV: 102.2 fL — ABNORMAL HIGH (ref 80.0–100.0)
Monocytes Absolute: 0.8 10*3/uL (ref 0.1–1.0)
Monocytes Relative: 9 %
Neutro Abs: 6.6 10*3/uL (ref 1.7–7.7)
Neutrophils Relative %: 80 %
Platelets: 104 10*3/uL — ABNORMAL LOW (ref 150–400)
RBC: 3.13 MIL/uL — ABNORMAL LOW (ref 4.22–5.81)
RDW: 17 % — ABNORMAL HIGH (ref 11.5–15.5)
WBC: 8.3 10*3/uL (ref 4.0–10.5)
nRBC: 0 % (ref 0.0–0.2)

## 2023-09-10 LAB — RESP PANEL BY RT-PCR (RSV, FLU A&B, COVID)  RVPGX2
Influenza A by PCR: NEGATIVE
Influenza B by PCR: NEGATIVE
Resp Syncytial Virus by PCR: NEGATIVE
SARS Coronavirus 2 by RT PCR: NEGATIVE

## 2023-09-10 LAB — COMPREHENSIVE METABOLIC PANEL
ALT: 15 U/L (ref 0–44)
AST: 18 U/L (ref 15–41)
Albumin: 2.2 g/dL — ABNORMAL LOW (ref 3.5–5.0)
Alkaline Phosphatase: 85 U/L (ref 38–126)
Anion gap: 6 (ref 5–15)
BUN: 17 mg/dL (ref 8–23)
CO2: 20 mmol/L — ABNORMAL LOW (ref 22–32)
Calcium: 7.8 mg/dL — ABNORMAL LOW (ref 8.9–10.3)
Chloride: 111 mmol/L (ref 98–111)
Creatinine, Ser: 0.93 mg/dL (ref 0.61–1.24)
GFR, Estimated: >60 mL/min (ref >60)
Glucose, Bld: 122 mg/dL — ABNORMAL HIGH (ref 70–99)
Potassium: 3 mmol/L — ABNORMAL LOW (ref 3.5–5.1)
Sodium: 137 mmol/L (ref 135–145)
Total Bilirubin: 1.6 mg/dL — ABNORMAL HIGH (ref 0.0–1.2)
Total Protein: 5.3 g/dL — ABNORMAL LOW (ref 6.5–8.1)

## 2023-09-10 LAB — TROPONIN I (HIGH SENSITIVITY): Troponin I (High Sensitivity): 14 ng/L (ref <18)

## 2023-09-10 MED ORDER — FUROSEMIDE 10 MG/ML IJ SOLN
10.0000 mg/h | INTRAVENOUS | Status: DC
  Administered 2023-09-11 – 2023-09-12 (×3): 10 mg/h via INTRAVENOUS
  Filled 2023-09-10 (×3): qty 20

## 2023-09-10 MED ORDER — POTASSIUM CHLORIDE 10 MEQ/100ML IV SOLN: 10.0000 meq | INTRAVENOUS | Status: AC

## 2023-09-10 MED ORDER — FENTANYL CITRATE PF 50 MCG/ML IJ SOSY
50.0000 ug | PREFILLED_SYRINGE | INTRAMUSCULAR | Status: DC | PRN
  Administered 2023-09-10: 50 ug via INTRAVENOUS
  Filled 2023-09-10: qty 1

## 2023-09-10 MED ORDER — NICOTINE POLACRILEX 2 MG MT GUM: 2.0000 mg | CHEWING_GUM | OROMUCOSAL | Status: DC | PRN

## 2023-09-10 MED ORDER — LACTATED RINGERS IV BOLUS
1000.0000 mL | Freq: Once | INTRAVENOUS | Status: DC
Start: 2023-09-10 — End: 2023-09-10

## 2023-09-10 MED ORDER — ONDANSETRON HCL 4 MG/2ML IJ SOLN
4.0000 mg | Freq: Once | INTRAMUSCULAR | Status: AC
  Administered 2023-09-10: 4 mg via INTRAVENOUS
  Filled 2023-09-10: qty 2

## 2023-09-10 MED ORDER — LORAZEPAM 1 MG PO TABS: 0.5000 mg | ORAL_TABLET | Freq: Three times a day (TID) | ORAL | Status: DC | PRN

## 2023-09-10 MED ORDER — FUROSEMIDE 10 MG/ML IJ SOLN
40.0000 mg | Freq: Once | INTRAMUSCULAR | Status: AC
  Administered 2023-09-11: 40 mg via INTRAVENOUS
  Filled 2023-09-10: qty 4

## 2023-09-10 MED ORDER — POTASSIUM CHLORIDE 20 MEQ PO PACK
60.0000 meq | PACK | ORAL | Status: AC
  Administered 2023-09-11 (×2): 60 meq via ORAL
  Filled 2023-09-10 (×2): qty 3

## 2023-09-10 MED ORDER — IOHEXOL 350 MG/ML SOLN
75.0000 mL | Freq: Once | INTRAVENOUS | Status: AC | PRN
  Administered 2023-09-10: 75 mL via INTRAVENOUS

## 2023-09-10 MED ORDER — POTASSIUM CHLORIDE CRYS ER 20 MEQ PO TBCR
40.0000 meq | EXTENDED_RELEASE_TABLET | Freq: Two times a day (BID) | ORAL | Status: DC
Start: 2023-09-10 — End: 2023-09-10
  Administered 2023-09-10: 40 meq via ORAL
  Filled 2023-09-10: qty 2

## 2023-09-10 MED ORDER — LEVOTHYROXINE SODIUM 50 MCG PO TABS
50.0000 ug | ORAL_TABLET | Freq: Every day | ORAL | Status: DC
  Administered 2023-09-11 – 2023-09-16 (×6): 50 ug via ORAL
  Filled 2023-09-10: qty 2
  Filled 2023-09-10 (×5): qty 1

## 2023-09-10 NOTE — Assessment & Plan Note (Signed)
Large-volume.  No associated with fever or elevated white count compared to baseline.  Therefore less likely spontaneous bacterial peritonitis.  CAT scan shows portal vein is patent.  ER attending has requested IR paracentesis in the morning.  Will follow-up same.

## 2023-09-10 NOTE — Assessment & Plan Note (Signed)
Current. Advised to quit especially due to oxygen use. Nicotine replacement being ordered

## 2023-09-10 NOTE — ED Triage Notes (Addendum)
Hx of lung cancer; on chronic oxygen   C/o Left sided abd pain x 2 weeks, hard on palpation. Reports sob is ongoing; currently on chemo   Initial sats reading 83%, oxygen on 4 L, bumped up to 6L

## 2023-09-10 NOTE — H&P (Signed)
History and Physical    Patient: Jerome Kelley:[id number] DOB: 09/15/1940 DOA: 09/10/2023 DOS: the patient was seen and examined on 09/10/2023 PCP: Irven Coe, MD  Patient coming from: Home  Chief Complaint:  Chief Complaint  Patient presents with   Abdominal Pain   Shortness of Breath   HPI: Jerome Kelley is a 83 y.o. male with medical history significant of lung cancer.  He also has metastatic adenocarcinoma of the lung.  Patient chronically uses 2 L/min of supplementary oxygen.  Patient was in his usual state of health till about 4 weeks ago.  Since that time patient is describing progressive lower extremity swelling initially starting around ankles progressing to the thighs.  For the last for 5 days patient has had progressive abdominal distention as well and for last 2 days he has been having exertional shortness of breath.  Shortness of breath has gotten so bad that the patient is requiring 6 L/min of oxygen at home to be comfortable and is still short of breath now.  There is no history of any fever cough expectoration chest pain palpitation or similar complaints in the past. Patient has mild genrealised abd disocmfort. No aggravating/reliving factor. No dysuria.  ER workup is notable for patient requiring 6 L/min of supplementary oxygen, marked abdominal distention noted.  Medical evaluation is sought. Review of Systems: As mentioned in the history of present illness. All other systems reviewed and are negative. Past Medical History:  Diagnosis Date   Colon cancer (HCC) 09/18/2011   Goals of care, counseling/discussion 03/15/2020   History of chemotherapy    History of radiation therapy 11/04/2020-12/15/2020    mediastinal and right hilar region    Dr Antony Blackbird   Hypertension    Lung cancer, upper lobe (HCC) 09/18/2011   Malignant neoplasm of upper lobe of right lung (HCC) 04/30/2020   Past Surgical History:  Procedure Laterality Date   BRONCHIAL WASHINGS  03/19/2020   Procedure:  BRONCHIAL WASHINGS;  Surgeon: Lupita Leash, MD;  Location: [facility];  Service: Cardiopulmonary;;   COLON SURGERY  2010   ENDOBRONCHIAL ULTRASOUND N/A 03/19/2020   Procedure: ENDOBRONCHIAL ULTRASOUND;  Surgeon: Lupita Leash, MD;  Location: [facility];  Service: Cardiopulmonary;  Laterality: N/A;   FINE NEEDLE ASPIRATION  03/19/2020   Procedure: FINE NEEDLE ASPIRATION;  Surgeon: Lupita Leash, MD;  Location: [facility];  Service: Cardiopulmonary;;   FINE NEEDLE ASPIRATION BIOPSY  04/02/2020   Procedure: FINE NEEDLE ASPIRATION BIOPSY;  Surgeon: Tomma Lightning, MD;  Location: [facility];  Service: Pulmonary;;   IR IMAGING GUIDED PORT INSERTION  05/07/2020   LUNG SURGERY     right side    port a cath insertion     port a cath removed     TOTAL HIP ARTHROPLASTY Right 10/18/2018   Procedure: TOTAL HIP ARTHROPLASTY ANTERIOR APPROACH;  Surgeon: Kathryne Hitch, MD;  Location: [facility];  Service: Orthopedics;  Laterality: Right;   VIDEO BRONCHOSCOPY N/A 03/19/2020   Procedure: VIDEO BRONCHOSCOPY WITHOUT FLUORO;  Surgeon: Lupita Leash, MD;  Location: [facility];  Service: Cardiopulmonary;  Laterality: N/A;   VIDEO BRONCHOSCOPY N/A 04/02/2020   Procedure: VIDEO BRONCHOSCOPY WITH EBUS;  Surgeon: Tomma Lightning, MD;  Location: [facility];  Service: Pulmonary;  Laterality: N/A;   Social History:  reports that he has been smoking cigarettes. He started smoking about 67 years ago. He has a 67 pack-year smoking history. He has never used smokeless tobacco. He reports current alcohol  use of about 3.0 standard drinks of alcohol per week. He reports that he does not use drugs.  No Known Allergies  Family History  Problem Relation Age of Onset   Stomach cancer Brother     Prior to Admission medications   Medication Sig Start Date End Date Taking? Authorizing Provider  aspirin 81 MG chewable tablet Chew 1 tablet (81 mg total) by mouth 2 (two) times daily. Patient  taking differently: Chew 81 mg by mouth daily. 10/20/18   Kirtland Bouchard, PA-C  docusate sodium (STOOL SOFTENER) 100 MG capsule Take 100 mg by mouth daily as needed for mild constipation.    [provider]  levothyroxine (SYNTHROID) 50 MCG tablet TAKE 1 TABLET BY MOUTH DAILY BEFORE BREAKFAST 09/04/23   Josph Macho, MD  lidocaine (LIDODERM) 5 % Place 1 patch onto the skin daily. Remove & Discard patch within 12 hours or as directed by MD    [provider]  lidocaine-prilocaine (EMLA) cream APPLY TO AFFECTED AREA ONCE AS DIRECTED 02/20/23   Josph Macho, MD  LORazepam (ATIVAN) 0.5 MG tablet TAKE 1 TABLET BY MOUTH EVERY 8 HOURS AS NEEDED FOR ANXIETY 08/31/23   Josph Macho, MD  megestrol (MEGACE) 400 MG/10ML suspension Take 10 mLs (400 mg total) by mouth 2 (two) times daily. 08/03/23   Josph Macho, MD  potassium chloride SA (KLOR-CON M) 20 MEQ tablet Take 1 tablet (20 mEq total) by mouth daily. Take for 5 days then stop. Patient not taking: Reported on 08/13/2023 07/06/23   Myrtis Ser, NP  vitamin B-12 (CYANOCOBALAMIN) 1000 MCG tablet Take 1 tablet (1,000 mcg total) by mouth daily. 10/20/18   Tyrone Nine, MD    Physical Exam: Vitals:   09/10/23 1955 09/10/23 1959 09/10/23 2245 09/10/23 2346  BP: (!) 122/54  (!) 158/76   Pulse: 77  77   Resp: 20  15   Temp:  (!) 97.4 F (36.3 C)  97.9 F (36.6 C)  TempSrc:  Oral  Temporal  SpO2: 96%  100%    General: Thin appearing gentleman in terms of bitemporal wasting guttering of forearms.  However abdominal distention is obvious.  Patient is alert and awake, gives a reasonably coherent account.  Although defers a lot of history taking to his son at the bedside Respiratory exam: Right basilar crackles are heard up to interscapular area Abdomen distended, soft non tender Extremities 1+ pitting edema up to just below the pelvic brim. Skin - no rash. Cvs -s1s2 normal.  Data Reviewed:  Labs on Admission:   Results for orders placed or performed during the hospital encounter of 09/10/23 (from the past 24 hours)  CBC with Differential     Status: Abnormal   Collection Time: 09/10/23  8:20 PM  Result Value Ref Range   WBC 8.3 4.0 - 10.5 K/uL   RBC 3.13 (L) 4.22 - 5.81 MIL/uL   Hemoglobin 10.4 (L) 13.0 - 17.0 g/dL   HCT 16.1 (L) 09.6 - 04.5 %   MCV 102.2 (H) 80.0 - 100.0 fL   MCH 33.2 26.0 - 34.0 pg   MCHC 32.5 30.0 - 36.0 g/dL   RDW 40.9 (H) 81.1 - 91.4 %   Platelets 104 (L) 150 - 400 K/uL   nRBC 0.0 0.0 - 0.2 %   Neutrophils Relative % 80 %   Neutro Abs 6.6 1.7 - 7.7 K/uL   Lymphocytes Relative 9 %   Lymphs Abs 0.8 0.7 - 4.0 K/uL  Monocytes Relative 9 %   Monocytes Absolute 0.8 0.1 - 1.0 K/uL   Eosinophils Relative 1 %   Eosinophils Absolute 0.1 0.0 - 0.5 K/uL   Basophils Relative 0 %   Basophils Absolute 0.0 0.0 - 0.1 K/uL   Immature Granulocytes 1 %   Abs Immature Granulocytes 0.06 0.00 - 0.07 K/uL  Comprehensive metabolic panel     Status: Abnormal   Collection Time: 09/10/23  8:20 PM  Result Value Ref Range   Sodium 137 135 - 145 mmol/L   Potassium 3.0 (L) 3.5 - 5.1 mmol/L   Chloride 111 98 - 111 mmol/L   CO2 20 (L) 22 - 32 mmol/L   Glucose, Bld 122 (H) 70 - 99 mg/dL   BUN 17 8 - 23 mg/dL   Creatinine, Ser 2.95 0.61 - 1.24 mg/dL   Calcium 7.8 (L) 8.9 - 10.3 mg/dL   Total Protein 5.3 (L) 6.5 - 8.1 g/dL   Albumin 2.2 (L) 3.5 - 5.0 g/dL   AST 18 15 - 41 U/L   ALT 15 0 - 44 U/L   Alkaline Phosphatase 85 38 - 126 U/L   Total Bilirubin 1.6 (H) 0.0 - 1.2 mg/dL   GFR, Estimated >62 >13 mL/min   Anion gap 6 5 - 15  Troponin I (High Sensitivity)     Status: None   Collection Time: 09/10/23  8:20 PM  Result Value Ref Range   Troponin I (High Sensitivity) 14 <18 ng/L  Resp panel by RT-PCR (RSV, Flu A&B, Covid) Anterior Nasal Swab     Status: None   Collection Time: 09/10/23  8:20 PM   Specimen: Anterior Nasal Swab  Result Value Ref Range   SARS Coronavirus 2 by RT PCR  NEGATIVE NEGATIVE   Influenza A by PCR NEGATIVE NEGATIVE   Influenza B by PCR NEGATIVE NEGATIVE   Resp Syncytial Virus by PCR NEGATIVE NEGATIVE   Basic Metabolic Panel: Recent Labs  Lab 09/10/23 2020  NA 137  K 3.0*  CL 111  CO2 20*  GLUCOSE 122*  BUN 17  CREATININE 0.93  CALCIUM 7.8*   Liver Function Tests: Recent Labs  Lab 09/10/23 2020  AST 18  ALT 15  ALKPHOS 85  BILITOT 1.6*  PROT 5.3*  ALBUMIN 2.2*   No results for input(s): "LIPASE", "AMYLASE" in the last 168 hours. No results for input(s): "AMMONIA" in the last 168 hours. CBC: Recent Labs  Lab 09/10/23 2020  WBC 8.3  NEUTROABS 6.6  HGB 10.4*  HCT 32.0*  MCV 102.2*  PLT 104*   Cardiac Enzymes: Recent Labs  Lab 09/10/23 2020  TROPONINIHS 14    BNP (last 3 results) No results for input(s): "PROBNP" in the last 8760 hours. CBG: No results for input(s): "GLUCAP" in the last 168 hours.  Radiological Exams on Admission:  CT ABDOMEN PELVIS W CONTRAST Result Date: 09/10/2023 CLINICAL DATA:  History of lung cancer, on chronic oxygen, left-sided abdominal pain for 2 weeks. Shortness of breath. EXAM: CT ABDOMEN AND PELVIS WITH CONTRAST TECHNIQUE: Multidetector CT imaging of the abdomen and pelvis was performed using the standard protocol following bolus administration of intravenous contrast. RADIATION DOSE REDUCTION: This exam was performed according to the departmental dose-optimization program which includes automated exposure control, adjustment of the mA and/or kV according to patient size and/or use of iterative reconstruction technique. CONTRAST:  75mL OMNIPAQUE IOHEXOL 350 MG/ML SOLN COMPARISON:  Same day chest radiograph and PET/CT 06/26/2023 FINDINGS: Lower chest: Scarring/atelectasis in the lower lungs.  No acute abnormality. Hepatobiliary: Shrunken liver with nodular contour compatible with cirrhosis. Portal vein is patent. Gallbladder and biliary tree are unremarkable. Pancreas: Unremarkable. Spleen:  Borderline splenomegaly measuring 14.4 cm in craniocaudal dimension. Adrenals/Urinary Tract: Normal adrenal glands. No urinary calculi or hydronephrosis. The bladder is unremarkable. Stomach/Bowel: Small hiatal hernia. Diffuse wall thickening about the small bowel. Additional wall thickening about the transverse colon postoperative change about the right colon with ileocolic anastomosis at the hepatic flexure. Vascular/Lymphatic: Aortic atherosclerosis. Paraesophageal varices. No enlarged abdominal or pelvic lymph nodes. Reproductive: No acute abnormality. Other: Large volume abdominopelvic ascites. No free intraperitoneal air. Musculoskeletal: No acute fracture.  Right THA.  Body wall edema. IMPRESSION: 1. Cirrhosis with sequela of portal hypertension including large volume abdominopelvic ascites, borderline splenomegaly, and paraesophageal varices. 2. Diffuse wall thickening about the small bowel and transverse colon, likely due to portal enteropathy/colopathy. Enterocolitis could appear similarly. Aortic Atherosclerosis (ICD10-I70.0). Electronically Signed   By: Minerva Fester M.D.   On: 09/10/2023 23:13   DG Chest Portable 1 View Result Date: 09/10/2023 CLINICAL DATA:  Shortness of breath. History of lung cancer. Left-sided abdominal pain. EXAM: PORTABLE CHEST 1 VIEW COMPARISON:  Head CT 06/26/2023 FINDINGS: Right chest port in place. Volume loss in the right hemithorax is likely post treatment related change, right hilar surgical clips. Heart is upper normal in size. There are calcified mediastinal and hilar lymph nodes. Suggestion of streaky left lung base opacity. No pulmonary edema, large pleural effusion or pneumothorax. Left pleural calcifications on prior PET are not well seen by radiograph. Skin fold projects over the left hemithorax. IMPRESSION: 1. Suggestion of streaky left lung base opacity, may represent atelectasis or pneumonia. 2. Volume loss in the right hemithorax is likely post treatment  related change. Electronically Signed   By: Narda Rutherford M.D.   On: 09/10/2023 21:11     EKG: Independently reviewed. Hard to interpret rhythm I will repeat EKG in AM    Assessment and Plan: * Anasarca Patient not chronically on diuretics. is noted to have systemic fluid overload manifesting as crackles heard on lung abdominal distention as well as edema, marked show seen on lower extremity. New hypoxia 6lpm oxygen use. Etiology of fluid overload under workup.  I will order an echo, LFTs noted for slight elevation of bilirubin, albumin is low.  Follow-up INR and PTT in the morning.  CAT scan is already showing cirrhosis changes of the liver.  I will also check urine protein creatinine ratio. Troponin is within normal limits.  We will start treatment for patient anasarca with Lasix 40 mg now as well as 10 mg/h infusion after that.  Monitor intake output and daily weights.  Patient potassium was noted to be 3.0.  Patient ordered for 20 mEq IV KCl by ER provider.  I will order additional 60 mEq KCl p.o. now plus another dose after 4 hours.  telemetry  Cachexia [facility]) Based on physical exam. Will get nutrition eval. Check phos. Empiric thiamine PO  Cirrhosis (HCC) Diagnossi based on CT scan demonstration, finding of ascites. Thrombocytoepnia. Will check hepatitis, panel, ferritin to ascertai cause.  Esophageal varices (HCC) Seen incidentally on CT abd pelvis. Start nadolol 20 mg daily . Uptitrate as able.  Ascites Large-volume.  No associated with fever or elevated white count compared to baseline.  Therefore less likely spontaneous bacterial peritonitis.  CAT scan shows portal vein is patent.  ER attending has requested IR paracentesis in the morning.  Will follow-up same.  Nicotine abuse Current. Advised  to quit especially due to oxygen use. Nicotine replacement being ordered  Colon cancer [facility]) Based on onc note 08/03/2023:  Principle Diagnosis:  Stage IIIB ([id number])  adenocarcinoma of the right middle lung History of stage Ia ([id number]) adenocarcinoma the right lung - resected in March 2011 History of stage II adenocarcinoma of the colon-high risk - April 2010 Metastatic adenocarcinoma of the lung --retroperitoneal nodal metastasis and new left lung nodule  --high PD-L1   Past Therapy:        Carboplatin/Alimta/Pembrolizumab - started 05/10/2020- s/p cycle 6 Radiation with weekly carboplatin/Taxol-start on 11/01/2020 -- completed on 12/20/2020 Yervoy/Opdivo --s/p cycle #4  -- start on 09/15/2021   Current Therapy:  Nivolumab -- maintenance -- s/p cycle #10 -- start on 01/17/2022 -- d/c on 08/03/2023  Patient has been getting radiation therapy since then - I am not sure to which part of the body however.    Anxiety -patient reports taking 0.5 mg every 8 hours as needed Ativan for anxiety.  I will reduce this to 0.25 mg as given cirrhosis, he is more likely to have complications.  Obviously we will have to monitor this clinically  Repeat EKG tomorrow AM pending given difficulty interpreting rhythm in last EKG. Check magnesium in AM  Med rec pending pharmacy input.  Given multiple advance medical issues as above, I am afraid patient near term prognosis is guarded - at least till above issues can be significantly reversed. Patinet has previously had goals of care discussions with his providers.   Advance Care Planning:   Code Status: Prior full code per patient and son.  Consults: IR eval in AM  Family Communication: son at bedside. All questions answered.  Severity of Illness: The appropriate patient status for this patient is INPATIENT. Inpatient status is judged to be reasonable and necessary in order to provide the required intensity of service to ensure the patient's safety. The patient's presenting symptoms, physical exam findings, and initial radiographic and laboratory data in the context of their chronic comorbidities is felt to place them at  high risk for further clinical deterioration. Furthermore, it is not anticipated that the patient will be medically stable for discharge from the hospital within 2 midnights of admission.   * I certify that at the point of admission it is my clinical judgment that the patient will require inpatient hospital care spanning beyond 2 midnights from the point of admission due to high intensity of service, high risk for further deterioration and high frequency of surveillance required.*  Author: Nolberto Hanlon, MD 09/10/2023 11:52 PM  For on call review [email or web address].

## 2023-09-10 NOTE — H&P (Incomplete)
History and Physical    Patient: Jerome Kelley WJX:[id number] DOB: March 25, 1941 DOA: 09/10/2023 DOS: the patient was seen and examined on 09/10/2023 PCP: Irven Coe, MD  Patient coming from: Home  Chief Complaint:  Chief Complaint  Patient presents with  . Abdominal Pain  . Shortness of Breath   HPI: Jerome Kelley is a 83 y.o. male with medical history significant of lung cancer.  He also has metastatic adenocarcinoma of the lung.  Patient chronically uses 2 L/min of supplementary oxygen.  Patient was in his usual state of health till about 4 weeks ago.  Since that time patient is describing progressive lower extremity swelling initially starting around ankles progressing to the thighs.  For the last for 5 days patient has had progressive abdominal distention as well and for last 2 days he has been having exertional shortness of breath.  Shortness of breath has gotten so bad that the patient is requiring 6 L/min of oxygen at home to be comfortable and is still short of breath now.  There is no history of any fever cough expectoration chest pain palpitation or similar complaints in the past. Patient has mild genrealised abd disocmfort. No aggravating/reliving factor. No dysuria.  ER workup is notable for patient requiring 6 L/min of supplementary oxygen, marked abdominal distention noted.  Medical evaluation is sought. Review of Systems: As mentioned in the history of present illness. All other systems reviewed and are negative. Past Medical History:  Diagnosis Date  . Colon cancer (HCC) 09/18/2011  . Goals of care, counseling/discussion 03/15/2020  . History of chemotherapy   . History of radiation therapy 11/04/2020-12/15/2020    mediastinal and right hilar region    Dr Antony Blackbird  . Hypertension   . Lung cancer, upper lobe (HCC) 09/18/2011  . Malignant neoplasm of upper lobe of right lung (HCC) 04/30/2020   Past Surgical History:  Procedure Laterality Date  . BRONCHIAL WASHINGS  03/19/2020    Procedure: BRONCHIAL WASHINGS;  Surgeon: Lupita Leash, MD;  Location: [facility];  Service: Cardiopulmonary;;  . COLON SURGERY  2010  . ENDOBRONCHIAL ULTRASOUND N/A 03/19/2020   Procedure: ENDOBRONCHIAL ULTRASOUND;  Surgeon: Lupita Leash, MD;  Location: [facility];  Service: Cardiopulmonary;  Laterality: N/A;  . FINE NEEDLE ASPIRATION  03/19/2020   Procedure: FINE NEEDLE ASPIRATION;  Surgeon: Lupita Leash, MD;  Location: [facility];  Service: Cardiopulmonary;;  . FINE NEEDLE ASPIRATION BIOPSY  04/02/2020   Procedure: FINE NEEDLE ASPIRATION BIOPSY;  Surgeon: Tomma Lightning, MD;  Location: [facility];  Service: Pulmonary;;  . IR IMAGING GUIDED PORT INSERTION  05/07/2020  . LUNG SURGERY     right side   . port a cath insertion    . port a cath removed    . TOTAL HIP ARTHROPLASTY Right 10/18/2018   Procedure: TOTAL HIP ARTHROPLASTY ANTERIOR APPROACH;  Surgeon: Kathryne Hitch, MD;  Location: [facility];  Service: Orthopedics;  Laterality: Right;  Marland Kitchen VIDEO BRONCHOSCOPY N/A 03/19/2020   Procedure: VIDEO BRONCHOSCOPY WITHOUT FLUORO;  Surgeon: Lupita Leash, MD;  Location: [facility];  Service: Cardiopulmonary;  Laterality: N/A;  . VIDEO BRONCHOSCOPY N/A 04/02/2020   Procedure: VIDEO BRONCHOSCOPY WITH EBUS;  Surgeon: Tomma Lightning, MD;  Location: [facility];  Service: Pulmonary;  Laterality: N/A;   Social History:  reports that he has been smoking cigarettes. He started smoking about 67 years ago. He has a 67 pack-year smoking history. He has never used smokeless tobacco. He reports current alcohol  use of about 3.0 standard drinks of alcohol per week. He reports that he does not use drugs.  No Known Allergies  Family History  Problem Relation Age of Onset  . Stomach cancer Brother     Prior to Admission medications   Medication Sig Start Date End Date Taking? Authorizing Provider  aspirin 81 MG chewable tablet Chew 1 tablet (81 mg total) by mouth 2 (two)  times daily. Patient taking differently: Chew 81 mg by mouth daily. 10/20/18   Kirtland Bouchard, PA-C  docusate sodium (STOOL SOFTENER) 100 MG capsule Take 100 mg by mouth daily as needed for mild constipation.    [provider]  levothyroxine (SYNTHROID) 50 MCG tablet TAKE 1 TABLET BY MOUTH DAILY BEFORE BREAKFAST 09/04/23   Josph Macho, MD  lidocaine (LIDODERM) 5 % Place 1 patch onto the skin daily. Remove & Discard patch within 12 hours or as directed by MD    [provider]  lidocaine-prilocaine (EMLA) cream APPLY TO AFFECTED AREA ONCE AS DIRECTED 02/20/23   Josph Macho, MD  LORazepam (ATIVAN) 0.5 MG tablet TAKE 1 TABLET BY MOUTH EVERY 8 HOURS AS NEEDED FOR ANXIETY 08/31/23   Josph Macho, MD  megestrol (MEGACE) 400 MG/10ML suspension Take 10 mLs (400 mg total) by mouth 2 (two) times daily. 08/03/23   Josph Macho, MD  potassium chloride SA (KLOR-CON M) 20 MEQ tablet Take 1 tablet (20 mEq total) by mouth daily. Take for 5 days then stop. Patient not taking: Reported on 08/13/2023 07/06/23   Myrtis Ser, NP  vitamin B-12 (CYANOCOBALAMIN) 1000 MCG tablet Take 1 tablet (1,000 mcg total) by mouth daily. 10/20/18   Tyrone Nine, MD    Physical Exam: Vitals:   09/10/23 1955 09/10/23 1959 09/10/23 2245 09/10/23 2346  BP: (!) 122/54  (!) 158/76   Pulse: 77  77   Resp: 20  15   Temp:  (!) 97.4 F (36.3 C)  97.9 F (36.6 C)  TempSrc:  Oral  Temporal  SpO2: 96%  100%    General: Thin appearing gentleman in terms of bitemporal wasting guttering of forearms.  However abdominal distention is obvious.  Patient is alert and awake, gives a reasonably coherent account.  Although defers a lot of history taking to his son at the bedside Respiratory exam: Right basilar crackles are heard up to interscapular area Abdomen distended, soft non tender Extremities 1+ pitting edema up to just below the pelvic brim. Skin - no rash. Cvs -s1s2 normal.  Data Reviewed: {Tip  this will not be part of the note when signed- Document your independent interpretation of telemetry tracing, EKG, lab, Radiology test or any other diagnostic tests. Add any new diagnostic test ordered today. (Optional):[id number]} Labs on Admission:  Results for orders placed or performed during the hospital encounter of 09/10/23 (from the past 24 hours)  CBC with Differential     Status: Abnormal   Collection Time: 09/10/23  8:20 PM  Result Value Ref Range   WBC 8.3 4.0 - 10.5 K/uL   RBC 3.13 (L) 4.22 - 5.81 MIL/uL   Hemoglobin 10.4 (L) 13.0 - 17.0 g/dL   HCT 16.1 (L) 09.6 - 04.5 %   MCV 102.2 (H) 80.0 - 100.0 fL   MCH 33.2 26.0 - 34.0 pg   MCHC 32.5 30.0 - 36.0 g/dL   RDW 40.9 (H) 81.1 - 91.4 %   Platelets 104 (L) 150 - 400 K/uL   nRBC 0.0 0.0 -  0.2 %   Neutrophils Relative % 80 %   Neutro Abs 6.6 1.7 - 7.7 K/uL   Lymphocytes Relative 9 %   Lymphs Abs 0.8 0.7 - 4.0 K/uL   Monocytes Relative 9 %   Monocytes Absolute 0.8 0.1 - 1.0 K/uL   Eosinophils Relative 1 %   Eosinophils Absolute 0.1 0.0 - 0.5 K/uL   Basophils Relative 0 %   Basophils Absolute 0.0 0.0 - 0.1 K/uL   Immature Granulocytes 1 %   Abs Immature Granulocytes 0.06 0.00 - 0.07 K/uL  Comprehensive metabolic panel     Status: Abnormal   Collection Time: 09/10/23  8:20 PM  Result Value Ref Range   Sodium 137 135 - 145 mmol/L   Potassium 3.0 (L) 3.5 - 5.1 mmol/L   Chloride 111 98 - 111 mmol/L   CO2 20 (L) 22 - 32 mmol/L   Glucose, Bld 122 (H) 70 - 99 mg/dL   BUN 17 8 - 23 mg/dL   Creatinine, Ser 1.47 0.61 - 1.24 mg/dL   Calcium 7.8 (L) 8.9 - 10.3 mg/dL   Total Protein 5.3 (L) 6.5 - 8.1 g/dL   Albumin 2.2 (L) 3.5 - 5.0 g/dL   AST 18 15 - 41 U/L   ALT 15 0 - 44 U/L   Alkaline Phosphatase 85 38 - 126 U/L   Total Bilirubin 1.6 (H) 0.0 - 1.2 mg/dL   GFR, Estimated >82 >95 mL/min   Anion gap 6 5 - 15  Troponin I (High Sensitivity)     Status: None   Collection Time: 09/10/23  8:20 PM  Result Value Ref Range   Troponin  I (High Sensitivity) 14 <18 ng/L  Resp panel by RT-PCR (RSV, Flu A&B, Covid) Anterior Nasal Swab     Status: None   Collection Time: 09/10/23  8:20 PM   Specimen: Anterior Nasal Swab  Result Value Ref Range   SARS Coronavirus 2 by RT PCR NEGATIVE NEGATIVE   Influenza A by PCR NEGATIVE NEGATIVE   Influenza B by PCR NEGATIVE NEGATIVE   Resp Syncytial Virus by PCR NEGATIVE NEGATIVE   Basic Metabolic Panel: Recent Labs  Lab 09/10/23 2020  NA 137  K 3.0*  CL 111  CO2 20*  GLUCOSE 122*  BUN 17  CREATININE 0.93  CALCIUM 7.8*   Liver Function Tests: Recent Labs  Lab 09/10/23 2020  AST 18  ALT 15  ALKPHOS 85  BILITOT 1.6*  PROT 5.3*  ALBUMIN 2.2*   No results for input(s): "LIPASE", "AMYLASE" in the last 168 hours. No results for input(s): "AMMONIA" in the last 168 hours. CBC: Recent Labs  Lab 09/10/23 2020  WBC 8.3  NEUTROABS 6.6  HGB 10.4*  HCT 32.0*  MCV 102.2*  PLT 104*   Cardiac Enzymes: Recent Labs  Lab 09/10/23 2020  TROPONINIHS 14    BNP (last 3 results) No results for input(s): "PROBNP" in the last 8760 hours. CBG: No results for input(s): "GLUCAP" in the last 168 hours.  Radiological Exams on Admission:  CT ABDOMEN PELVIS W CONTRAST Result Date: 09/10/2023 CLINICAL DATA:  History of lung cancer, on chronic oxygen, left-sided abdominal pain for 2 weeks. Shortness of breath. EXAM: CT ABDOMEN AND PELVIS WITH CONTRAST TECHNIQUE: Multidetector CT imaging of the abdomen and pelvis was performed using the standard protocol following bolus administration of intravenous contrast. RADIATION DOSE REDUCTION: This exam was performed according to the departmental dose-optimization program which includes automated exposure control, adjustment of the mA and/or kV  according to patient size and/or use of iterative reconstruction technique. CONTRAST:  75mL OMNIPAQUE IOHEXOL 350 MG/ML SOLN COMPARISON:  Same day chest radiograph and PET/CT 06/26/2023 FINDINGS: Lower chest:  Scarring/atelectasis in the lower lungs. No acute abnormality. Hepatobiliary: Shrunken liver with nodular contour compatible with cirrhosis. Portal vein is patent. Gallbladder and biliary tree are unremarkable. Pancreas: Unremarkable. Spleen: Borderline splenomegaly measuring 14.4 cm in craniocaudal dimension. Adrenals/Urinary Tract: Normal adrenal glands. No urinary calculi or hydronephrosis. The bladder is unremarkable. Stomach/Bowel: Small hiatal hernia. Diffuse wall thickening about the small bowel. Additional wall thickening about the transverse colon postoperative change about the right colon with ileocolic anastomosis at the hepatic flexure. Vascular/Lymphatic: Aortic atherosclerosis. Paraesophageal varices. No enlarged abdominal or pelvic lymph nodes. Reproductive: No acute abnormality. Other: Large volume abdominopelvic ascites. No free intraperitoneal air. Musculoskeletal: No acute fracture.  Right THA.  Body wall edema. IMPRESSION: 1. Cirrhosis with sequela of portal hypertension including large volume abdominopelvic ascites, borderline splenomegaly, and paraesophageal varices. 2. Diffuse wall thickening about the small bowel and transverse colon, likely due to portal enteropathy/colopathy. Enterocolitis could appear similarly. Aortic Atherosclerosis (ICD10-I70.0). Electronically Signed   By: Minerva Fester M.D.   On: 09/10/2023 23:13   DG Chest Portable 1 View Result Date: 09/10/2023 CLINICAL DATA:  Shortness of breath. History of lung cancer. Left-sided abdominal pain. EXAM: PORTABLE CHEST 1 VIEW COMPARISON:  Head CT 06/26/2023 FINDINGS: Right chest port in place. Volume loss in the right hemithorax is likely post treatment related change, right hilar surgical clips. Heart is upper normal in size. There are calcified mediastinal and hilar lymph nodes. Suggestion of streaky left lung base opacity. No pulmonary edema, large pleural effusion or pneumothorax. Left pleural calcifications on prior PET are  not well seen by radiograph. Skin fold projects over the left hemithorax. IMPRESSION: 1. Suggestion of streaky left lung base opacity, may represent atelectasis or pneumonia. 2. Volume loss in the right hemithorax is likely post treatment related change. Electronically Signed   By: Narda Rutherford M.D.   On: 09/10/2023 21:11     EKG: Independently reviewed. Hard to interpret rhythm I will repeat EKG in AM    Assessment and Plan: * Anasarca Patient not chronically on diuretics. is noted to have systemic fluid overload manifesting as crackles heard on lung abdominal distention as well as edema, marked show seen on lower extremity.  Etiology of fluid overload under workup.  I will order an echo, LFTs noted for slight elevation of bilirubin, albumin is low.  Follow-up INR and PTT in the morning.  CAT scan is already showing cirrhosis changes of the liver.  I will also check urine protein creatinine ratio. Troponin is within normal limits.  We will start treatment for patient anasarca with Lasix 40 mg now as well as 10 mg/h infusion after that.  Monitor intake output and daily weights.  Patient potassium was noted to be 3.0.  Patient ordered for 20 mEq IV KCl by ER provider.  I will order additional 60 mEq KCl p.o. now plus another dose after 4 hours.  telemetry  Ascites Large-volume.  No associated with fever or elevated white count compared to baseline.  Therefore less likely spontaneous bacterial peritonitis.  CAT scan shows portal vein is patent.  ER attending has requested IR paracentesis in the morning.  Will follow-up same.  Nicotine abuse Current. Advised to quit especially due to oxygen use. Nicotine replacement being ordered   Repeat EKG tomorrow AM pending given difficulty interpreting rhythm in last EKG.  Check magnesium in AM    Advance Care Planning:   Code Status: Prior full code per patient and son.  Consults: IR eval in AM  Family Communication: son at bedside. All  questions answered.  Severity of Illness: The appropriate patient status for this patient is INPATIENT. Inpatient status is judged to be reasonable and necessary in order to provide the required intensity of service to ensure the patient's safety. The patient's presenting symptoms, physical exam findings, and initial radiographic and laboratory data in the context of their chronic comorbidities is felt to place them at high risk for further clinical deterioration. Furthermore, it is not anticipated that the patient will be medically stable for discharge from the hospital within 2 midnights of admission.   * I certify that at the point of admission it is my clinical judgment that the patient will require inpatient hospital care spanning beyond 2 midnights from the point of admission due to high intensity of service, high risk for further deterioration and high frequency of surveillance required.*  Author: Nolberto Hanlon, MD 09/10/2023 11:52 PM  For on call review [email or web address].

## 2023-09-10 NOTE — ED Provider Notes (Addendum)
[place] EMERGENCY DEPARTMENT AT [facility] Provider Note   CSN: [id number] Arrival date & time: 09/10/23  1945     History Chief Complaint  Patient presents with   Abdominal Pain   Shortness of Breath    HPI Jerome Kelley is a 83 y.o. male presenting for chief complaint of abdominal pain and SOB. Son at bedside provides most of the history.  He has a history of lung cancer status postchemotherapy as well as radiation therapy.  Recently a new lesion was detected on PET scan.  Stereotactic radiation was initiated.  However over the last 4 days has had substantial abdominal swelling, shortness of breath and fatigue required increasing of his oxygen from 4 L to 6 L. Very poor p.o. intake over the last 48 hours in the setting of this abdominal pain and swelling.  Patient's recorded medical, surgical, social, medication list and allergies were reviewed in the Snapshot window as part of the initial history.   Review of Systems   Review of Systems  Constitutional:  Negative for chills and fever.  HENT:  Negative for ear pain and sore throat.   Eyes:  Negative for pain and visual disturbance.  Respiratory:  Negative for cough and shortness of breath.   Cardiovascular:  Negative for chest pain and palpitations.  Gastrointestinal:  Positive for abdominal pain and nausea. Negative for vomiting.  Genitourinary:  Negative for dysuria and hematuria.  Musculoskeletal:  Negative for arthralgias and back pain.  Skin:  Negative for color change and rash.  Neurological:  Negative for seizures and syncope.  All other systems reviewed and are negative.   Physical Exam Updated Vital Signs BP (!) 158/76   Pulse 77   Temp (!) 97.4 F (36.3 C) (Oral)   Resp 15   SpO2 100%  Physical Exam Vitals and nursing note reviewed.  Constitutional:      General: He is not in acute distress.    Appearance: He is well-developed.  HENT:     Head: Normocephalic and atraumatic.  Eyes:      Conjunctiva/sclera: Conjunctivae normal.  Cardiovascular:     Rate and Rhythm: Normal rate and regular rhythm.     Heart sounds: No murmur heard. Pulmonary:     Effort: Pulmonary effort is normal. No respiratory distress.     Breath sounds: Normal breath sounds.  Abdominal:     Palpations: Abdomen is soft.     Tenderness: There is no abdominal tenderness.  Musculoskeletal:        General: No swelling.     Cervical back: Neck supple.  Skin:    General: Skin is warm and dry.     Capillary Refill: Capillary refill takes less than 2 seconds.  Neurological:     Mental Status: He is alert.  Psychiatric:        Mood and Affect: Mood normal.      ED Course/ Medical Decision Making/ A&P    Procedures Procedures   Medications Ordered in ED Medications  fentaNYL (SUBLIMAZE) injection 50 mcg (50 mcg Intravenous Given 09/10/23 2033)  potassium chloride 10 mEq in 100 mL IVPB (0 mEq Intravenous Hold 09/10/23 2310)  potassium chloride SA (KLOR-CON M) CR tablet 40 mEq (40 mEq Oral Given 09/10/23 2206)  ondansetron (ZOFRAN) injection 4 mg (4 mg Intravenous Given 09/10/23 2033)  iohexol (OMNIPAQUE) 350 MG/ML injection 75 mL (75 mLs Intravenous Contrast Given 09/10/23 2244)   Medical Decision Making:   Jerome Kelley is a 83  y.o. male who presented to the ED today with abdominal pain, detailed above.    Additional history discussed with patient's family/caregivers.  Patient placed on continuous vitals and telemetry monitoring while in ED which was reviewed periodically.  Complete initial physical exam performed, notably the patient  was HDS in NAD.     Reviewed and confirmed nursing documentation for past medical history, family history, social history.    Initial Assessment:   With the patient's presentation of abdominal pain, most likely diagnosis is nonspecific etiology. Other diagnoses were considered including (but not limited to) gastroenteritis, colitis, small bowel obstruction,  appendicitis, cholecystitis, pancreatitis, nephrolithiasis, UTI, pyleonephritis, testicular torsion. These are considered less likely due to history of present illness and physical exam findings.   This is most consistent with an acute life/limb threatening illness complicated by underlying chronic conditions.   Initial Plan:  CBC/CMP to evaluate for underlying infectious/metabolic etiology for patient's abdominal pain  Lipase to evaluate for pancreatitis  EKG to evaluate for cardiac source of pain  CTAB/Pelvis with contrast to evaluate for structural/surgical etiology of patients' severe abdominal pain.  Urinalysis and repeat physical assessment to evaluate for UTI/Pyelonpehritis  Empiric management of symptoms with escalating pain control and antiemetics as needed.   Initial Study Results:   Laboratory  All laboratory results reviewed without evidence of clinically relevant pathology.    EKG EKG was reviewed independently. Rate, rhythm, axis, intervals all examined and without medically relevant abnormality. ST segments without concerns for elevations.    Radiology All images reviewed independently. Agree with radiology report at this time.   DG Chest Portable 1 View Result Date: 09/10/2023 CLINICAL DATA:  Shortness of breath. History of lung cancer. Left-sided abdominal pain. EXAM: PORTABLE CHEST 1 VIEW COMPARISON:  Head CT 06/26/2023 FINDINGS: Right chest port in place. Volume loss in the right hemithorax is likely post treatment related change, right hilar surgical clips. Heart is upper normal in size. There are calcified mediastinal and hilar lymph nodes. Suggestion of streaky left lung base opacity. No pulmonary edema, large pleural effusion or pneumothorax. Left pleural calcifications on prior PET are not well seen by radiograph. Skin fold projects over the left hemithorax. IMPRESSION: 1. Suggestion of streaky left lung base opacity, may represent atelectasis or pneumonia. 2. Volume loss  in the right hemithorax is likely post treatment related change. Electronically Signed   By: Narda Rutherford M.D.   On: 09/10/2023 21:11   Final Reassessment and Plan:   CT demonstrated large volume ascitic fluid collection.  This is new for the patient accumulated rapidly over 4 days.  His albumin is 2.2.  He does have substantial abdominal discomfort from the rapid accumulation and I believe he would benefit from therapeutic drainage as well as diagnostic evaluation. However over the albumin level, I believe that having it done in the IR suite in the inpatient setting would be more conducive to diagnostic and therapeutic care for the patient. Will admit to medicine in the interim for stabilization overnight treatment of his abdominal pain and nausea as well as poor p.o. intake  Disposition:   Based on the above findings, I believe this patient is stable for admission.    Patient/family educated about specific findings on our evaluation and explained exact reasons for admission.  Patient/family educated about clinical situation and time was allowed to answer questions.   Admission team communicated with and agreed with need for admission. Patient admitted. Patient ready to move at this time.     Emergency  Department Medication Summary:   Medications  fentaNYL (SUBLIMAZE) injection 50 mcg (50 mcg Intravenous Given 09/10/23 2033)  potassium chloride 10 mEq in 100 mL IVPB (0 mEq Intravenous Hold 09/10/23 2310)  potassium chloride SA (KLOR-CON M) CR tablet 40 mEq (40 mEq Oral Given 09/10/23 2206)  ondansetron (ZOFRAN) injection 4 mg (4 mg Intravenous Given 09/10/23 2033)  iohexol (OMNIPAQUE) 350 MG/ML injection 75 mL (75 mLs Intravenous Contrast Given 09/10/23 2244)            Clinical Impression:  1. Generalized abdominal pain      Data Unavailable   Final Clinical Impression(s) / ED Diagnoses Final diagnoses:  Generalized abdominal pain    Rx / DC Orders ED Discharge Orders      None         Glyn Ade, MD 09/10/23 1610    Glyn Ade, MD 09/10/23 2322

## 2023-09-11 ENCOUNTER — Inpatient Hospital Stay (HOSPITAL_COMMUNITY): Payer: Medicare Other

## 2023-09-11 DIAGNOSIS — R64 Cachexia: Secondary | ICD-10-CM | POA: Diagnosis present

## 2023-09-11 DIAGNOSIS — I8500 Esophageal varices without bleeding: Secondary | ICD-10-CM

## 2023-09-11 DIAGNOSIS — F17210 Nicotine dependence, cigarettes, uncomplicated: Secondary | ICD-10-CM | POA: Diagnosis present

## 2023-09-11 DIAGNOSIS — Z515 Encounter for palliative care: Secondary | ICD-10-CM | POA: Diagnosis not present

## 2023-09-11 DIAGNOSIS — Z79890 Hormone replacement therapy (postmenopausal): Secondary | ICD-10-CM | POA: Diagnosis not present

## 2023-09-11 DIAGNOSIS — K7460 Unspecified cirrhosis of liver: Secondary | ICD-10-CM | POA: Diagnosis present

## 2023-09-11 DIAGNOSIS — D529 Folate deficiency anemia, unspecified: Secondary | ICD-10-CM | POA: Diagnosis present

## 2023-09-11 DIAGNOSIS — I509 Heart failure, unspecified: Secondary | ICD-10-CM | POA: Diagnosis not present

## 2023-09-11 DIAGNOSIS — R531 Weakness: Secondary | ICD-10-CM | POA: Diagnosis not present

## 2023-09-11 DIAGNOSIS — Z66 Do not resuscitate: Secondary | ICD-10-CM | POA: Diagnosis not present

## 2023-09-11 DIAGNOSIS — Z1152 Encounter for screening for COVID-19: Secondary | ICD-10-CM | POA: Diagnosis not present

## 2023-09-11 DIAGNOSIS — J9811 Atelectasis: Secondary | ICD-10-CM | POA: Diagnosis present

## 2023-09-11 DIAGNOSIS — I8510 Secondary esophageal varices without bleeding: Secondary | ICD-10-CM | POA: Diagnosis present

## 2023-09-11 DIAGNOSIS — F05 Delirium due to known physiological condition: Secondary | ICD-10-CM | POA: Diagnosis present

## 2023-09-11 DIAGNOSIS — E43 Unspecified severe protein-calorie malnutrition: Secondary | ICD-10-CM | POA: Diagnosis present

## 2023-09-11 DIAGNOSIS — K7682 Hepatic encephalopathy: Secondary | ICD-10-CM | POA: Diagnosis present

## 2023-09-11 DIAGNOSIS — I10 Essential (primary) hypertension: Secondary | ICD-10-CM | POA: Diagnosis present

## 2023-09-11 DIAGNOSIS — R601 Generalized edema: Secondary | ICD-10-CM | POA: Diagnosis not present

## 2023-09-11 DIAGNOSIS — E876 Hypokalemia: Secondary | ICD-10-CM | POA: Diagnosis present

## 2023-09-11 DIAGNOSIS — D696 Thrombocytopenia, unspecified: Secondary | ICD-10-CM | POA: Diagnosis present

## 2023-09-11 DIAGNOSIS — N179 Acute kidney failure, unspecified: Secondary | ICD-10-CM | POA: Diagnosis present

## 2023-09-11 DIAGNOSIS — C3490 Malignant neoplasm of unspecified part of unspecified bronchus or lung: Secondary | ICD-10-CM | POA: Diagnosis not present

## 2023-09-11 DIAGNOSIS — E8770 Fluid overload, unspecified: Secondary | ICD-10-CM | POA: Diagnosis present

## 2023-09-11 DIAGNOSIS — C772 Secondary and unspecified malignant neoplasm of intra-abdominal lymph nodes: Secondary | ICD-10-CM | POA: Diagnosis present

## 2023-09-11 DIAGNOSIS — R188 Other ascites: Secondary | ICD-10-CM | POA: Diagnosis not present

## 2023-09-11 DIAGNOSIS — Z7401 Bed confinement status: Secondary | ICD-10-CM | POA: Diagnosis not present

## 2023-09-11 DIAGNOSIS — F1020 Alcohol dependence, uncomplicated: Secondary | ICD-10-CM | POA: Diagnosis present

## 2023-09-11 DIAGNOSIS — C7800 Secondary malignant neoplasm of unspecified lung: Secondary | ICD-10-CM | POA: Diagnosis present

## 2023-09-11 DIAGNOSIS — Z781 Physical restraint status: Secondary | ICD-10-CM | POA: Diagnosis not present

## 2023-09-11 DIAGNOSIS — J9601 Acute respiratory failure with hypoxia: Secondary | ICD-10-CM | POA: Diagnosis present

## 2023-09-11 DIAGNOSIS — Z7189 Other specified counseling: Secondary | ICD-10-CM | POA: Diagnosis not present

## 2023-09-11 HISTORY — PX: IR PARACENTESIS: IMG2679

## 2023-09-11 LAB — APTT: aPTT: 36 s (ref 24–36)

## 2023-09-11 LAB — COMPREHENSIVE METABOLIC PANEL
ALT: 12 U/L (ref 0–44)
AST: 17 U/L (ref 15–41)
Albumin: 2.1 g/dL — ABNORMAL LOW (ref 3.5–5.0)
Alkaline Phosphatase: 70 U/L (ref 38–126)
Anion gap: 6 (ref 5–15)
BUN: 16 mg/dL (ref 8–23)
CO2: 20 mmol/L — ABNORMAL LOW (ref 22–32)
Calcium: 7.7 mg/dL — ABNORMAL LOW (ref 8.9–10.3)
Chloride: 115 mmol/L — ABNORMAL HIGH (ref 98–111)
Creatinine, Ser: 0.91 mg/dL (ref 0.61–1.24)
GFR, Estimated: >60 mL/min (ref >60)
Glucose, Bld: 116 mg/dL — ABNORMAL HIGH (ref 70–99)
Potassium: 4.1 mmol/L (ref 3.5–5.1)
Sodium: 141 mmol/L (ref 135–145)
Total Bilirubin: 1.8 mg/dL — ABNORMAL HIGH (ref 0.0–1.2)
Total Protein: 5 g/dL — ABNORMAL LOW (ref 6.5–8.1)

## 2023-09-11 LAB — PROTIME-INR
INR: 1.2 (ref 0.8–1.2)
INR: 1.3 — ABNORMAL HIGH (ref 0.8–1.2)
Prothrombin Time: 15.7 s — ABNORMAL HIGH (ref 11.4–15.2)
Prothrombin Time: 16.1 s — ABNORMAL HIGH (ref 11.4–15.2)

## 2023-09-11 LAB — ECHOCARDIOGRAM COMPLETE
Area-P 1/2: 3.17 cm2
Est EF: 55
S' Lateral: 3.2 cm
Single Plane A4C EF: 66.9 %

## 2023-09-11 LAB — URINALYSIS, COMPLETE (UACMP) WITH MICROSCOPIC
Bacteria, UA: NONE SEEN
Bilirubin Urine: NEGATIVE
Glucose, UA: NEGATIVE mg/dL
Hgb urine dipstick: NEGATIVE
Ketones, ur: NEGATIVE mg/dL
Leukocytes,Ua: NEGATIVE
Nitrite: NEGATIVE
Protein, ur: NEGATIVE mg/dL
Specific Gravity, Urine: >1.046 — ABNORMAL HIGH (ref 1.005–1.030)
pH: 5 (ref 5.0–8.0)

## 2023-09-11 LAB — CBC
HCT: 30.1 % — ABNORMAL LOW (ref 39.0–52.0)
HCT: 29 % — ABNORMAL LOW (ref 39.0–52.0)
Hemoglobin: 9.9 g/dL — ABNORMAL LOW (ref 13.0–17.0)
Hemoglobin: 9.4 g/dL — ABNORMAL LOW (ref 13.0–17.0)
MCH: 33.3 pg (ref 26.0–34.0)
MCH: 33.2 pg (ref 26.0–34.0)
MCHC: 32.9 g/dL (ref 30.0–36.0)
MCHC: 32.4 g/dL (ref 30.0–36.0)
MCV: 101.3 fL — ABNORMAL HIGH (ref 80.0–100.0)
MCV: 102.5 fL — ABNORMAL HIGH (ref 80.0–100.0)
Platelets: 96 10*3/uL — ABNORMAL LOW (ref 150–400)
Platelets: 92 10*3/uL — ABNORMAL LOW (ref 150–400)
RBC: 2.97 MIL/uL — ABNORMAL LOW (ref 4.22–5.81)
RBC: 2.83 MIL/uL — ABNORMAL LOW (ref 4.22–5.81)
RDW: 16.9 % — ABNORMAL HIGH (ref 11.5–15.5)
RDW: 16.8 % — ABNORMAL HIGH (ref 11.5–15.5)
WBC: 7.1 10*3/uL (ref 4.0–10.5)
WBC: 6 10*3/uL (ref 4.0–10.5)
nRBC: 0 % (ref 0.0–0.2)
nRBC: 0 % (ref 0.0–0.2)

## 2023-09-11 LAB — GRAM STAIN

## 2023-09-11 LAB — MAGNESIUM: Magnesium: 2 mg/dL (ref 1.7–2.4)

## 2023-09-11 LAB — LACTATE DEHYDROGENASE, PLEURAL OR PERITONEAL FLUID: LD, Fluid: 30 U/L — ABNORMAL HIGH (ref 3–23)

## 2023-09-11 LAB — BODY FLUID CELL COUNT WITH DIFFERENTIAL
Eos, Fluid: 0 %
Lymphs, Fluid: 18 %
Monocyte-Macrophage-Serous Fluid: 32 % — ABNORMAL LOW (ref 50–90)
Neutrophil Count, Fluid: 50 % — ABNORMAL HIGH (ref 0–25)
Total Nucleated Cell Count, Fluid: 127 uL (ref 0–1000)

## 2023-09-11 LAB — CREATININE, SERUM
Creatinine, Ser: 0.95 mg/dL (ref 0.61–1.24)
GFR, Estimated: >60 mL/min (ref >60)

## 2023-09-11 LAB — HEPATITIS PANEL, ACUTE
HCV Ab: NONREACTIVE
Hep A IgM: NONREACTIVE
Hep B C IgM: NONREACTIVE
Hepatitis B Surface Ag: NONREACTIVE

## 2023-09-11 LAB — PROTEIN, PLEURAL OR PERITONEAL FLUID: Total protein, fluid: <3 g/dL

## 2023-09-11 LAB — PROTEIN / CREATININE RATIO, URINE
Creatinine, Urine: 245 mg/dL
Protein Creatinine Ratio: 0.06 mg/mg{creat} (ref 0.00–0.15)
Total Protein, Urine: 15 mg/dL

## 2023-09-11 LAB — AMMONIA: Ammonia: 42 umol/L — ABNORMAL HIGH (ref 9–35)

## 2023-09-11 LAB — FERRITIN: Ferritin: 149 ng/mL (ref 24–336)

## 2023-09-11 LAB — PHOSPHORUS: Phosphorus: 2.5 mg/dL (ref 2.5–4.6)

## 2023-09-11 MED ORDER — THIAMINE MONONITRATE 100 MG PO TABS
100.0000 mg | ORAL_TABLET | Freq: Two times a day (BID) | ORAL | Status: DC
  Administered 2023-09-11 – 2023-09-16 (×12): 100 mg via ORAL
  Filled 2023-09-11 (×12): qty 1

## 2023-09-11 MED ORDER — LACTULOSE 10 GM/15ML PO SOLN
30.0000 g | Freq: Two times a day (BID) | ORAL | Status: DC
  Administered 2023-09-11 – 2023-09-16 (×9): 30 g via ORAL
  Filled 2023-09-11 (×6): qty 45
  Filled 2023-09-11: qty 60
  Filled 2023-09-11 (×5): qty 45

## 2023-09-11 MED ORDER — POTASSIUM CHLORIDE CRYS ER 20 MEQ PO TBCR
20.0000 meq | EXTENDED_RELEASE_TABLET | Freq: Every day | ORAL | Status: DC
  Administered 2023-09-11 – 2023-09-16 (×6): 20 meq via ORAL
  Filled 2023-09-11 (×6): qty 1

## 2023-09-11 MED ORDER — SODIUM CHLORIDE 0.9% FLUSH
10.0000 mL | Freq: Two times a day (BID) | INTRAVENOUS | Status: DC
  Administered 2023-09-11 – 2023-09-16 (×7): 10 mL

## 2023-09-11 MED ORDER — ACETAMINOPHEN 650 MG RE SUPP
650.0000 mg | Freq: Four times a day (QID) | RECTAL | Status: DC | PRN
Start: 2023-09-11 — End: 2023-09-16

## 2023-09-11 MED ORDER — ENOXAPARIN SODIUM 40 MG/0.4ML IJ SOSY
40.0000 mg | PREFILLED_SYRINGE | INTRAMUSCULAR | Status: DC
  Administered 2023-09-12 – 2023-09-16 (×5): 40 mg via SUBCUTANEOUS
  Filled 2023-09-11 (×5): qty 0.4

## 2023-09-11 MED ORDER — ASPIRIN 81 MG PO CHEW
81.0000 mg | CHEWABLE_TABLET | Freq: Every day | ORAL | Status: DC
  Administered 2023-09-11 – 2023-09-16 (×6): 81 mg via ORAL
  Filled 2023-09-11 (×6): qty 1

## 2023-09-11 MED ORDER — GERHARDT'S BUTT CREAM
TOPICAL_CREAM | Freq: Every day | CUTANEOUS | Status: DC
  Filled 2023-09-11: qty 60

## 2023-09-11 MED ORDER — ACETAMINOPHEN 325 MG PO TABS: 650.0000 mg | ORAL_TABLET | Freq: Four times a day (QID) | ORAL | Status: DC | PRN

## 2023-09-11 MED ORDER — SODIUM CHLORIDE 0.9% FLUSH
3.0000 mL | Freq: Two times a day (BID) | INTRAVENOUS | Status: DC
  Administered 2023-09-11 – 2023-09-16 (×10): 3 mL via INTRAVENOUS

## 2023-09-11 MED ORDER — NADOLOL 20 MG PO TABS
20.0000 mg | ORAL_TABLET | Freq: Every day | ORAL | Status: DC
  Administered 2023-09-11 – 2023-09-16 (×6): 20 mg via ORAL
  Filled 2023-09-11 (×6): qty 1

## 2023-09-11 MED ORDER — POLYETHYLENE GLYCOL 3350 17 G PO PACK: 17.0000 g | PACK | Freq: Every day | ORAL | Status: DC | PRN

## 2023-09-11 MED ORDER — HALOPERIDOL LACTATE 5 MG/ML IJ SOLN: 2.0000 mg | Freq: Once | INTRAMUSCULAR | Status: DC

## 2023-09-11 MED ORDER — LORAZEPAM 0.5 MG PO TABS: 0.2500 mg | ORAL_TABLET | Freq: Three times a day (TID) | ORAL | Status: DC | PRN

## 2023-09-11 MED ORDER — CHLORHEXIDINE GLUCONATE CLOTH 2 % EX PADS
6.0000 | MEDICATED_PAD | Freq: Every day | CUTANEOUS | Status: DC
  Administered 2023-09-11 – 2023-09-16 (×6): 6 via TOPICAL

## 2023-09-11 NOTE — Procedures (Signed)
PROCEDURE SUMMARY:  Successful US guided paracentesis from right abdomen.  Yielded 5 L of clear yellow fluid.  No immediate complications.  Pt tolerated well.   Specimen sent for labs.  EBL < 2 mL  Mickie Kay, NP 09/11/2023 4:30 PM

## 2023-09-11 NOTE — ED Notes (Signed)
Son, Jovin Fester asked to be contacted for update as soon as possible.

## 2023-09-11 NOTE — Progress Notes (Signed)
   09/11/23 1815  Vitals  Temp 97.6 F (36.4 C)  Temp Source Axillary  BP (!) 123/49  MAP (mmHg) 71  BP Location Left Arm  BP Method Automatic  Patient Position (if appropriate) Lying  Pulse Rate 66  Pulse Rate Source Monitor  ECG Heart Rate 65  Resp (!) 21  Level of Consciousness  Level of Consciousness Alert  MEWS COLOR  MEWS Score Color Green  Oxygen Therapy  SpO2 95 %  O2 Device Nasal Cannula  O2 Flow Rate (L/min) 4 L/min  Pain Assessment  Pain Scale 0-10  Pain Score 0  Complaints & Interventions  Neuro symptoms relieved by Rest  Hot/Cold Interventions  Hot/Cold Interventions Warm blankets  PCA/Epidural/Spinal Assessment  Respiratory Pattern Regular;Unlabored;Symmetrical  Glasgow Coma Scale  Eye Opening 4  Best Verbal Response (NON-intubated) 5  Best Motor Response 6  Glasgow Coma Scale Score 15  MEWS Score  MEWS Temp 0  MEWS Systolic 0  MEWS Pulse 0  MEWS RR 1  MEWS LOC 0  MEWS Score 1   Pt arrived to unit from ED with personal belongings and on 4L [place]. Pt A&Ox3, MAEx4. 3+ pitting edema noted to BLE. Paracentesis site unremarkable with gauze drsg clean, dry, and intact. Skin assessment completed with second RN per hospital policy and existing pressure injuries noted. Stage 1 pressure injury noted to Bilateral heels, cleansed, foam drsg applied and elevated off bed. Stage 1 pressure injury noted to left buttocks, cleansed and foam drsg applied. Port drsg clean, dry, intact and infusing. Redness with moisture associated skin irritation noted to perineum. Condom cath present upon admission and working without complications. Tele applied and CCMD called. Pt oriented to the unit, room and call light system. Bed in lowest position with alarm on and call bell within reach. Report given to oncoming shift.

## 2023-09-11 NOTE — ED Notes (Signed)
Patient transported to IR

## 2023-09-11 NOTE — Assessment & Plan Note (Signed)
Seen incidentally on CT abd pelvis. Start nadolol 20 mg daily . Uptitrate as able.

## 2023-09-11 NOTE — Progress Notes (Addendum)
PROGRESS NOTE    Jerome Kelley  ZOX:[id number] DOB: 09/18/1940 DOA: 09/10/2023 PCP: Irven Coe, MD   Brief Narrative:  HPI: Jerome Kelley is a 83 y.o. male with medical history significant of lung cancer.  He also has metastatic adenocarcinoma of the lung.   Patient chronically uses 2 L/min of supplementary oxygen.  Patient was in his usual state of health till about 4 weeks ago.  Since that time patient is describing progressive lower extremity swelling initially starting around ankles progressing to the thighs.  For the last for 5 days patient has had progressive abdominal distention as well and for last 2 days he has been having exertional shortness of breath.  Shortness of breath has gotten so bad that the patient is requiring 6 L/min of oxygen at home to be comfortable and is still short of breath now.  There is no history of any fever cough expectoration chest pain palpitation or similar complaints in the past. Patient has mild genrealised abd disocmfort. No aggravating/reliving factor. No dysuria.   ER workup is notable for patient requiring 6 L/min of supplementary oxygen, marked abdominal distention noted.  Medical evaluation is sought.  Assessment & Plan:   Principal Problem:   Anasarca Active Problems:   Colon cancer (HCC)   Nicotine abuse   Ascites   Esophageal varices (HCC)   Cirrhosis (HCC)   Cachexia (HCC)  Anasarca/cachexia Patient not chronically on diuretics. is noted to have systemic fluid overload manifesting as crackles heard on lung abdominal distention as well as edema, marked show seen on lower extremity. LFTs noted for slight elevation of bilirubin, albumin is low.  INR is elevated.  CAT scan is already showing cirrhosis changes of the liver.  Checking protein creatinine ratio. Troponin is within normal limits Patient was given 1 dose of Lasix 40 mg followed by 10 mg/h infusion after that.  Monitor intake output and daily weights.  Patient has had low urine output  compared to the amount of Lasix that he has received.  Will continue Lasix.  Monitor electrolytes.  Dietitian is consulted.  Acute encephalopathy/?  Acute hepatic encephalopathy: Patient alert and oriented to self but he knows that he is in [facility] but could not tell me the name of the hospital, month or the year.  Baseline mental status unknown.  Concerned about possible acute hepatic encephalopathy with new diagnosis of liver cirrhosis.  Will check ammonia level.  Hypokalemia: Replenished and normal today.   Acute on chronic hypoxic respiratory failure: Chest x-ray shows possibility of infiltrates.  Atelectasis may also be the cause due to abdominal distention.  Patient is afebrile so doubt pneumonia.  Oxygen requirement has improved and currently on 4 L.  Encouraged incentive spirometry and wean as able to.  Hopefully he will improve further after paracentesis.  Echo is pending as well.     Liver cirrhosis (HCC)/ascites New Diagnossi based on CT scan demonstration, finding of ascites. Thrombocytoepnia.  Ferritin normal, viral hepatitis panel ruled out.  IR is consulted for paracentesis.  With no abdominal tenderness and no fever, doubt SBP.   Esophageal varices (HCC) Seen incidentally on CT abd pelvis. Started nadolol 20 mg daily . Uptitrate as able.   Nicotine abuse Current. Advised to quit especially due to oxygen use. Nicotine replacement being ordered   Colon cancer [facility]) Based on onc note 08/03/2023:   Principle Diagnosis:  Stage IIIB ([id number]) adenocarcinoma of the right middle lung History of stage Ia (T1aN0M0) adenocarcinoma the right lung -  resected in March 2011 History of stage II adenocarcinoma of the colon-high risk - April 2010 Metastatic adenocarcinoma of the lung --retroperitoneal nodal metastasis and new left lung nodule  --high PD-L1   Past Therapy:        Carboplatin/Alimta/Pembrolizumab - started 05/10/2020- s/p cycle 6 Radiation with weekly  carboplatin/Taxol-start on 11/01/2020 -- completed on 12/20/2020 Yervoy/Opdivo --s/p cycle #4  -- start on 09/15/2021   Current Therapy:  Nivolumab -- maintenance -- s/p cycle #10 -- start on 01/17/2022 -- d/c on 08/03/2023   Patient has been getting radiation therapy since then - I am not sure to which part of the body however.   Anxiety -patient reports taking 0.5 mg every 8 hours as needed Ativan for anxiety.  Admitting hospitalist has reduced the dosage to 0.25 mg as given cirrhosis, he is more likely to have complications.  Obviously we will have to monitor this clinically  Anemia of chronic disease: Hemoglobin at baseline between 9-10.  Currently at baseline.  Thrombocytopenia: This is also chronic.  No signs of bleeding.  Stable.  Monitor.  DVT prophylaxis: enoxaparin (LOVENOX) injection 40 mg Start: 09/12/23 0800 SCDs Start: 09/11/23 0008   Code Status: Full Code  Family Communication:  None present at bedside.    Status is: Inpatient Remains inpatient appropriate because: Needs paracentesis and IV diuretics   Estimated body mass index is 24.48 kg/m as calculated from the following:   Height as of 08/13/23: 5\' 8"  (1.727 m).   Weight as of 08/13/23: 73 kg.    Nutritional Assessment: There is no height or weight on file to calculate BMI.. Seen by dietician.  I agree with the assessment and plan as outlined below: Nutrition Status:        . Skin Assessment: I have examined the patient's skin and I agree with the wound assessment as performed by the wound care RN as outlined below:    Consultants:  None  Procedures:  None  Antimicrobials:  Anti-infectives (From admission, onward)    None         Subjective: Patient seen and examined.  He is alert and oriented to self and place.  Could not tell me the month or the year.  Unknown baseline mental status.  Objective: Vitals:   09/11/23 0715 09/11/23 0730 09/11/23 0745 09/11/23 0804  BP: 130/80 135/77 (!)  141/74   Pulse: 85 80 75   Resp: 15 15 18    Temp:    97.6 F (36.4 C)  TempSrc:      SpO2: 100% 100% 100%     Intake/Output Summary (Last 24 hours) at 09/11/2023 0829 Last data filed at 09/11/2023 0610 Gross per 24 hour  Intake 240 ml  Output 1750 ml  Net -1510 ml   There were no vitals filed for this visit.  Examination:  General exam: Appears calm and comfortable  Respiratory system: Clear to auscultation. Respiratory effort normal. Cardiovascular system: S1 & S2 heard, RRR. No JVD, murmurs, rubs, gallops or clicks. No pedal edema. Gastrointestinal system: Abdomen is nondistended, soft and nontender. No organomegaly or masses felt. Normal bowel sounds heard. Central nervous system: Alert and oriented x 2. No focal neurological deficits. Extremities: Symmetric 5 x 5 power. Skin: No rashes, lesions or ulcers Psychiatry: Judgement and insight appear poor   Data Reviewed: I have personally reviewed following labs and imaging studies  CBC: Recent Labs  Lab 09/10/23 2020 09/11/23 0136 09/11/23 0415  WBC 8.3 7.1 6.0  NEUTROABS 6.6  --   --  HGB 10.4* 9.9* 9.4*  HCT 32.0* 30.1* 29.0*  MCV 102.2* 101.3* 102.5*  PLT 104* 96* 92*   Basic Metabolic Panel: Recent Labs  Lab 09/10/23 2020 09/11/23 0136 09/11/23 0415  NA 137  --  141  K 3.0*  --  4.1  CL 111  --  115*  CO2 20*  --  20*  GLUCOSE 122*  --  116*  BUN 17  --  16  CREATININE 0.93 0.95 0.91  CALCIUM 7.8*  --  7.7*  MG  --   --  2.0  PHOS  --   --  2.5   GFR: CrCl cannot be calculated (Unknown ideal weight.). Liver Function Tests: Recent Labs  Lab 09/10/23 2020 09/11/23 0415  AST 18 17  ALT 15 12  ALKPHOS 85 70  BILITOT 1.6* 1.8*  PROT 5.3* 5.0*  ALBUMIN 2.2* 2.1*   No results for input(s): "LIPASE", "AMYLASE" in the last 168 hours. No results for input(s): "AMMONIA" in the last 168 hours. Coagulation Profile: Recent Labs  Lab 09/11/23 0136 09/11/23 0415  INR 1.2 1.3*   Cardiac  Enzymes: No results for input(s): "CKTOTAL", "CKMB", "CKMBINDEX", "TROPONINI" in the last 168 hours. BNP (last 3 results) No results for input(s): "PROBNP" in the last 8760 hours. HbA1C: No results for input(s): "HGBA1C" in the last 72 hours. CBG: No results for input(s): "GLUCAP" in the last 168 hours. Lipid Profile: No results for input(s): "CHOL", "HDL", "LDLCALC", "TRIG", "CHOLHDL", "LDLDIRECT" in the last 72 hours. Thyroid Function Tests: No results for input(s): "TSH", "T4TOTAL", "FREET4", "T3FREE", "THYROIDAB" in the last 72 hours. Anemia Panel: Recent Labs    09/11/23 0415  FERRITIN 149   Sepsis Labs: No results for input(s): "PROCALCITON", "LATICACIDVEN" in the last 168 hours.  Recent Results (from the past 240 hours)  Resp panel by RT-PCR (RSV, Flu A&B, Covid) Anterior Nasal Swab     Status: None   Collection Time: 09/10/23  8:20 PM   Specimen: Anterior Nasal Swab  Result Value Ref Range Status   SARS Coronavirus 2 by RT PCR NEGATIVE NEGATIVE Final   Influenza A by PCR NEGATIVE NEGATIVE Final   Influenza B by PCR NEGATIVE NEGATIVE Final    Comment: (NOTE) The Xpert Xpress SARS-CoV-2/FLU/RSV plus assay is intended as an aid in the diagnosis of influenza from Nasopharyngeal swab specimens and should not be used as a sole basis for treatment. Nasal washings and aspirates are unacceptable for Xpert Xpress SARS-CoV-2/FLU/RSV testing.  Fact Sheet for Patients: BloggerCourse.com  Fact Sheet for Healthcare Providers: SeriousBroker.it  This test is not yet approved or cleared by the [place] FDA and has been authorized for detection and/or diagnosis of SARS-CoV-2 by FDA under an Emergency Use Authorization (EUA). This EUA will remain in effect (meaning this test can be used) for the duration of the COVID-19 declaration under Section 564(b)(1) of the Act, 21 U.S.C. section 360bbb-3(b)(1), unless the authorization is  terminated or revoked.     Resp Syncytial Virus by PCR NEGATIVE NEGATIVE Final    Comment: (NOTE) Fact Sheet for Patients: BloggerCourse.com  Fact Sheet for Healthcare Providers: SeriousBroker.it  This test is not yet approved or cleared by the [place] FDA and has been authorized for detection and/or diagnosis of SARS-CoV-2 by FDA under an Emergency Use Authorization (EUA). This EUA will remain in effect (meaning this test can be used) for the duration of the COVID-19 declaration under Section 564(b)(1) of the Act, 21 U.S.C. section 360bbb-3(b)(1), unless the authorization  is terminated or revoked.  Performed at [facility], [address] [place], [place] [address]      Radiology Studies: CT ABDOMEN PELVIS W CONTRAST Result Date: 09/10/2023 CLINICAL DATA:  History of lung cancer, on chronic oxygen, left-sided abdominal pain for 2 weeks. Shortness of breath. EXAM: CT ABDOMEN AND PELVIS WITH CONTRAST TECHNIQUE: Multidetector CT imaging of the abdomen and pelvis was performed using the standard protocol following bolus administration of intravenous contrast. RADIATION DOSE REDUCTION: This exam was performed according to the departmental dose-optimization program which includes automated exposure control, adjustment of the mA and/or kV according to patient size and/or use of iterative reconstruction technique. CONTRAST:  75mL OMNIPAQUE IOHEXOL 350 MG/ML SOLN COMPARISON:  Same day chest radiograph and PET/CT 06/26/2023 FINDINGS: Lower chest: Scarring/atelectasis in the lower lungs. No acute abnormality. Hepatobiliary: Shrunken liver with nodular contour compatible with cirrhosis. Portal vein is patent. Gallbladder and biliary tree are unremarkable. Pancreas: Unremarkable. Spleen: Borderline splenomegaly measuring 14.4 cm in craniocaudal dimension. Adrenals/Urinary Tract: Normal adrenal glands. No urinary calculi or hydronephrosis.  The bladder is unremarkable. Stomach/Bowel: Small hiatal hernia. Diffuse wall thickening about the small bowel. Additional wall thickening about the transverse colon postoperative change about the right colon with ileocolic anastomosis at the hepatic flexure. Vascular/Lymphatic: Aortic atherosclerosis. Paraesophageal varices. No enlarged abdominal or pelvic lymph nodes. Reproductive: No acute abnormality. Other: Large volume abdominopelvic ascites. No free intraperitoneal air. Musculoskeletal: No acute fracture.  Right THA.  Body wall edema. IMPRESSION: 1. Cirrhosis with sequela of portal hypertension including large volume abdominopelvic ascites, borderline splenomegaly, and paraesophageal varices. 2. Diffuse wall thickening about the small bowel and transverse colon, likely due to portal enteropathy/colopathy. Enterocolitis could appear similarly. Aortic Atherosclerosis (ICD10-I70.0). Electronically Signed   By: Minerva Fester M.D.   On: 09/10/2023 23:13   DG Chest Portable 1 View Result Date: 09/10/2023 CLINICAL DATA:  Shortness of breath. History of lung cancer. Left-sided abdominal pain. EXAM: PORTABLE CHEST 1 VIEW COMPARISON:  Head CT 06/26/2023 FINDINGS: Right chest port in place. Volume loss in the right hemithorax is likely post treatment related change, right hilar surgical clips. Heart is upper normal in size. There are calcified mediastinal and hilar lymph nodes. Suggestion of streaky left lung base opacity. No pulmonary edema, large pleural effusion or pneumothorax. Left pleural calcifications on prior PET are not well seen by radiograph. Skin fold projects over the left hemithorax. IMPRESSION: 1. Suggestion of streaky left lung base opacity, may represent atelectasis or pneumonia. 2. Volume loss in the right hemithorax is likely post treatment related change. Electronically Signed   By: Narda Rutherford M.D.   On: 09/10/2023 21:11    Scheduled Meds:  aspirin  81 mg Oral Daily   Chlorhexidine  Gluconate Cloth  6 each Topical Daily   [START ON 09/12/2023] enoxaparin (LOVENOX) injection  40 mg Subcutaneous Q24H   levothyroxine  50 mcg Oral QAC breakfast   nadolol  20 mg Oral Daily   potassium chloride SA  20 mEq Oral Daily   sodium chloride flush  10-40 mL Intracatheter Q12H   sodium chloride flush  3 mL Intravenous Q12H   thiamine  100 mg Oral BID   Continuous Infusions:  furosemide (LASIX) 200 mg in dextrose 5 % 100 mL (2 mg/mL) infusion 10 mg/hr (09/11/23 0119)     LOS: 0 days   Hughie Closs, MD [facility]  09/11/2023, 8:30 AM   *Please note that this is a verbal dictation therefore any spelling or grammatical errors  are due to the "Dragon Medical One" system interpretation.  Please page via Amion and do not message via secure chat for urgent patient care matters. Secure chat can be used for non urgent patient care matters.  How to contact the [facility] Attending or Consulting provider 7A - 7P or covering provider during after hours 7P -7A, for this patient?  Check the care team in [facility] and look for a) attending/consulting [facility] provider listed and b) the [facility] team listed. Page or secure chat 7A-7P. Log into [email or web address] and use [place]'s universal password to access. If you do not have the password, please contact the hospital operator. Locate the [facility] provider you are looking for under [facility] and page to a number that you can be directly reached. If you still have difficulty reaching the provider, please page the [facility] (Director on Call) for the Hospitalists listed on amion for assistance.

## 2023-09-11 NOTE — Assessment & Plan Note (Addendum)
Patient not chronically on diuretics. is noted to have systemic fluid overload manifesting as crackles heard on lung abdominal distention as well as edema, marked show seen on lower extremity. New hypoxia 6lpm oxygen use. Etiology of fluid overload under workup.  I will order an echo, LFTs noted for slight elevation of bilirubin, albumin is low.  Follow-up INR and PTT in the morning.  CAT scan is already showing cirrhosis changes of the liver.  I will also check urine protein creatinine ratio. Troponin is within normal limits.  We will start treatment for patient anasarca with Lasix 40 mg now as well as 10 mg/h infusion after that.  Monitor intake output and daily weights.  Patient potassium was noted to be 3.0.  Patient ordered for 20 mEq IV KCl by ER provider.  I will order additional 60 mEq KCl p.o. now plus another dose after 4 hours.  telemetry

## 2023-09-11 NOTE — Assessment & Plan Note (Signed)
Diagnossi based on CT scan demonstration, finding of ascites. Thrombocytoepnia. Will check hepatitis, panel, ferritin to ascertai cause.

## 2023-09-11 NOTE — Progress Notes (Signed)
  Echocardiogram 2D Echocardiogram has been performed.  Janalyn Harder 09/11/2023, 1:57 PM

## 2023-09-11 NOTE — Progress Notes (Signed)
Patient is currently refusing to take lactulose and thiamine medications from this RN, telling RN to get out of room. RN asked charge RN and another nurse to help administer medication but was unsuccessful. While in the room RN noticed patients Bps are trending down with the current BP being 82/64 (70). When RN went to fix Bp cuff patient started becoming increasingly agitated to the point he was grabbing staffs hands and trying to throw remote and hit staff for not leaving room. RN unsure how accurate Bp is since patient wont allow staffing to fix cuff.    2215-RN paged oncall hospitalist about new agitation and low BP.   2220-While waiting for orders patient removed tele monitoring, removed accessed chest port, IV and gown. This RN and tech cleaned patient up and placed patient back in bed.   2233-Was able to get a better BP on patient 114/88 map 98.   Afterwards patients son arrived to room. Patient calmed down once son arrived and was agreeable to taking medications and stated he wont pull anything out anymore. RN did receive orders for IM haldol however, patient is now calm again and no longer needed medication.

## 2023-09-11 NOTE — ED Notes (Signed)
Patient turned and assisted to bsc and cleaned after. Clean sheets applied.

## 2023-09-11 NOTE — Assessment & Plan Note (Addendum)
Based on onc note 08/03/2023:  Principle Diagnosis:  Stage IIIB ([id number]) adenocarcinoma of the right middle lung History of stage Ia (T1aN0M0) adenocarcinoma the right lung - resected in March 2011 History of stage II adenocarcinoma of the colon-high risk - April 2010 Metastatic adenocarcinoma of the lung --retroperitoneal nodal metastasis and new left lung nodule  --high PD-L1   Past Therapy:        Carboplatin/Alimta/Pembrolizumab - started 05/10/2020- s/p cycle 6 Radiation with weekly carboplatin/Taxol-start on 11/01/2020 -- completed on 12/20/2020 Yervoy/Opdivo --s/p cycle #4  -- start on 09/15/2021   Current Therapy:  Nivolumab -- maintenance -- s/p cycle #10 -- start on 01/17/2022 -- d/c on 08/03/2023  Patient has been getting radiation therapy since then - I am not sure to which part of the body however.

## 2023-09-11 NOTE — Plan of Care (Signed)
  Problem: Clinical Measurements: Goal: Will remain free from infection Outcome: Progressing Goal: Cardiovascular complication will be avoided Outcome: Progressing   Problem: Coping: Goal: Level of anxiety will decrease Outcome: Progressing   Problem: Elimination: Goal: Will not experience complications related to urinary retention Outcome: Progressing   Problem: Pain Managment: Goal: General experience of comfort will improve and/or be controlled Outcome: Progressing   Problem: Safety: Goal: Ability to remain free from injury will improve Outcome: Progressing

## 2023-09-11 NOTE — Progress Notes (Signed)
Chest port now re accessed and lasix drip restarted.

## 2023-09-11 NOTE — Assessment & Plan Note (Addendum)
Based on physical exam. Will get nutrition eval. Check phos. Empiric thiamine PO

## 2023-09-12 DIAGNOSIS — R601 Generalized edema: Secondary | ICD-10-CM | POA: Diagnosis not present

## 2023-09-12 DIAGNOSIS — E43 Unspecified severe protein-calorie malnutrition: Secondary | ICD-10-CM | POA: Insufficient documentation

## 2023-09-12 LAB — COMPREHENSIVE METABOLIC PANEL
ALT: 13 U/L (ref 0–44)
AST: 21 U/L (ref 15–41)
Albumin: 2.1 g/dL — ABNORMAL LOW (ref 3.5–5.0)
Alkaline Phosphatase: 72 U/L (ref 38–126)
Anion gap: 9 (ref 5–15)
BUN: 19 mg/dL (ref 8–23)
CO2: 23 mmol/L (ref 22–32)
Calcium: 7.7 mg/dL — ABNORMAL LOW (ref 8.9–10.3)
Chloride: 107 mmol/L (ref 98–111)
Creatinine, Ser: 1.08 mg/dL (ref 0.61–1.24)
GFR, Estimated: >60 mL/min (ref >60)
Glucose, Bld: 95 mg/dL (ref 70–99)
Potassium: 3.6 mmol/L (ref 3.5–5.1)
Sodium: 139 mmol/L (ref 135–145)
Total Bilirubin: 2.8 mg/dL — ABNORMAL HIGH (ref 0.0–1.2)
Total Protein: 4.9 g/dL — ABNORMAL LOW (ref 6.5–8.1)

## 2023-09-12 LAB — CBC WITH DIFFERENTIAL/PLATELET
Abs Immature Granulocytes: 0.1 10*3/uL — ABNORMAL HIGH (ref 0.00–0.07)
Basophils Absolute: 0 10*3/uL (ref 0.0–0.1)
Basophils Relative: 0 %
Eosinophils Absolute: 0 10*3/uL (ref 0.0–0.5)
Eosinophils Relative: 0 %
HCT: 31.9 % — ABNORMAL LOW (ref 39.0–52.0)
Hemoglobin: 10.6 g/dL — ABNORMAL LOW (ref 13.0–17.0)
Immature Granulocytes: 1 %
Lymphocytes Relative: 7 %
Lymphs Abs: 0.9 10*3/uL (ref 0.7–4.0)
MCH: 33 pg (ref 26.0–34.0)
MCHC: 33.2 g/dL (ref 30.0–36.0)
MCV: 99.4 fL (ref 80.0–100.0)
Monocytes Absolute: 1.4 10*3/uL — ABNORMAL HIGH (ref 0.1–1.0)
Monocytes Relative: 10 %
Neutro Abs: 11.2 10*3/uL — ABNORMAL HIGH (ref 1.7–7.7)
Neutrophils Relative %: 82 %
Platelets: 132 10*3/uL — ABNORMAL LOW (ref 150–400)
RBC: 3.21 MIL/uL — ABNORMAL LOW (ref 4.22–5.81)
RDW: 17.1 % — ABNORMAL HIGH (ref 11.5–15.5)
WBC: 13.7 10*3/uL — ABNORMAL HIGH (ref 4.0–10.5)
nRBC: 0 % (ref 0.0–0.2)

## 2023-09-12 LAB — MAGNESIUM: Magnesium: 1.9 mg/dL (ref 1.7–2.4)

## 2023-09-12 LAB — AMMONIA: Ammonia: 32 umol/L (ref 9–35)

## 2023-09-12 LAB — CYTOLOGY - NON PAP

## 2023-09-12 LAB — PROCALCITONIN: Procalcitonin: 0.22 ng/mL

## 2023-09-12 MED ORDER — ALBUMIN HUMAN 25 % IV SOLN
12.5000 g | Freq: Four times a day (QID) | INTRAVENOUS | Status: AC
  Administered 2023-09-12 (×3): 12.5 g via INTRAVENOUS
  Filled 2023-09-12 (×3): qty 50

## 2023-09-12 MED ORDER — ENSURE ENLIVE PO LIQD
237.0000 mL | Freq: Two times a day (BID) | ORAL | Status: DC
  Administered 2023-09-12 – 2023-09-15 (×4): 237 mL via ORAL

## 2023-09-12 MED ORDER — QUETIAPINE FUMARATE 50 MG PO TABS
25.0000 mg | ORAL_TABLET | ORAL | Status: DC
  Administered 2023-09-12 – 2023-09-15 (×4): 25 mg via ORAL
  Filled 2023-09-12 (×4): qty 1

## 2023-09-12 MED ORDER — ADULT MULTIVITAMIN W/MINERALS CH
1.0000 | ORAL_TABLET | Freq: Every day | ORAL | Status: DC
  Administered 2023-09-12 – 2023-09-16 (×5): 1 via ORAL
  Filled 2023-09-12 (×5): qty 1

## 2023-09-12 NOTE — Progress Notes (Addendum)
0415-Patient has now pulled off tele leads off, gown off and removed chest port again for the second time. Patient was previously confused/agitated but became oriented again once son was here and stayed that way up until now. However now patient is confused again but not agitated at the moment.   0419-Provider wanted to hold off on given ativan and haldol right now. RN received order for bilateral wrist restraints.    0539-Port now reestablished and lasix drip restarted

## 2023-09-12 NOTE — Progress Notes (Addendum)
Initial Nutrition Assessment  DOCUMENTATION CODES:   Severe malnutrition in context of chronic illness  INTERVENTION:   Liberalize diet to promote PO intake (Can be placed back on sodium restricted diet when PO intake increases) Room service with assist Went over menu options with patient and son Ensure Enlive po BID, each supplement provides 350 kcal and 20 grams of protein. Magic cup TID with meals, each supplement provides 290 kcal and 9 grams of protein Multivitamin with minerals daily  100 mg Thiamine daily Monitor magnesium, potassium, and phosphorus BID for at least 3 days, MD to replete as needed, as pt is at risk for refeeding syndrome given Severe malnutrition and minimal nutrition for >7 days.   NUTRITION DIAGNOSIS:   Severe Malnutrition related to chronic illness as evidenced by severe muscle depletion, severe fat depletion, energy intake < 75% for > 7 days.  GOAL:   Patient will meet greater than or equal to 90% of their needs  MONITOR:   PO intake, Weight trends, Supplement acceptance, I & O's, Labs  REASON FOR ASSESSMENT:   Consult Assessment of nutrition requirement/status  ASSESSMENT:  83 y.o Male with PMH of lung cancer, colon cancer, tobacco use,on 2L oxygen at home, started treatment 04/2020 and has been getting radiation therapy recently. Found to have anasarca/cachexia, acute encephalopathy, hypoxic respiratory failure, new liver cirrhosis/ascites   1/27 - Admitted 1/28 - IR paracentesis of right abdomen, pulled 5L  Patient lying in bed, is a poor historian. Appears very frail and malnourished, in restraints. Was able to take his lunch order and he was able to tell me he has no teeth. Kept statign he was thirsty. Called son, Darell for nutritional history. Patient lives with wife who does the cooking. He was eating ok before his swelling started 4 weeks ago and had a noticeable  decrease in his PO intake 2 weeks before hospitalization. His son reports the  only nutrition he had during this time was 1/2 egg sandwich and 1/2 cheeseburger. He was also bed bound the week before being admitted.    Reviewed weight history and his lowest weight was 142 lbs in August 2024. His son reports he has been gaining weight, both agreed that his weight gain may be caused by fluid accumulation.   Patient did not receive breakfast today due to being in restraints and being appropriate for room service.  Spoke to MD about nutrition support if continued poor PO intake. MD in agreement but wants to wait until Friday to give him time since 5L was just pulled from him yesterday.   This afternoon spoke to RN and she reports he had 1/2 his meal, 1 Ensure, and a couple bites of a magic cup. Also spoke to son at beside and went over menu options, answered any questions they might have. He would not like a feeding tube unless it was the last option. Will hold off on calorie count for now as his intake is improving.   Admit weight: 73 kg -Elevated to due fluid  Current weight: 69.2 kg + 3 RLE, LLE, Perineal edema  Weight History:  08/13/23 73 kg  08/03/23 71.7 kg  07/06/23 70.7 kg  05/09/23 66.7 kg  04/11/23 64 kg  03/14/23 66.2 kg  02/14/23 66.2 kg  07/03/22 70.3 kg  06/07/22 70.3 kg  05/10/22 70.3 kg   Average Meal Intake: No meals recorded  Nutritionally Relevant Medications: Scheduled Meds:  lactulose  30 g Oral BID   levothyroxine  50 mcg Oral  QAC breakfast   nadolol  20 mg Oral Daily   potassium chloride SA  20 mEq Oral Daily   thiamine  100 mg Oral BID   Continuous Infusions:  albumin human 12.5 g (09/12/23 0936)   furosemide (LASIX) 200 mg in dextrose 5 % 100 mL (2 mg/mL) infusion 10 mg/hr (09/12/23 0539)   Labs Reviewed: Calcium 7.7,  BG ranges from 95-116 mg/dL over the last 24 hours  NUTRITION - FOCUSED PHYSICAL EXAM:  Flowsheet Row Most Recent Value  Orbital Region Severe depletion  Upper Arm Region Severe depletion  Thoracic and Lumbar  Region Severe depletion  Buccal Region Severe depletion  Temple Region Severe depletion  Clavicle Bone Region Severe depletion  Clavicle and Acromion Bone Region Severe depletion  Scapular Bone Region Severe depletion  Dorsal Hand Severe depletion  Patellar Region Severe depletion  Anterior Thigh Region Severe depletion  Posterior Calf Region Severe depletion  Edema (RD Assessment) Mild  [Legs]  Hair Reviewed  Eyes Reviewed  Mouth Reviewed  [No teeth]  Skin Reviewed  Nails Reviewed       Diet Order:   Diet Order             Diet regular Room service appropriate? Yes with Assist; Fluid consistency: Thin; Fluid restriction: 1800 mL Fluid  Diet effective now                   EDUCATION NEEDS:   Not appropriate for education at this time  Skin:  Skin Assessment: Skin Integrity Issues: Skin Integrity Issues:: Stage I Stage I: Heel and buttocks  Last BM:  PTA  Height:   Ht Readings from Last 1 Encounters:  08/13/23 5\' 8"  (1.727 m)    Weight:   Wt Readings from Last 1 Encounters:  09/12/23 69.2 kg    Ideal Body Weight:  70 kg  BMI:  Body mass index is 23.2 kg/m.  Estimated Nutritional Needs:   Kcal:  1700-1800 kcal  Protein:  100-120 gm  Fluid:  >1.7L  Elliot Dally, RD Registered Dietitian  See Amion for more information

## 2023-09-12 NOTE — Progress Notes (Signed)
PROGRESS NOTE    Jerome Kelley  ZOX:[id number] DOB: 1941-05-19 DOA: 09/10/2023 PCP: Jerome Coe, MD   Brief Narrative:  HPI: Jerome Kelley is a 83 y.o. male with medical history significant of lung cancer.  He also has metastatic adenocarcinoma of the lung.   Patient chronically uses 2 L/min of supplementary oxygen.  Patient was in his usual state of health till about 4 weeks ago.  Since that time patient is describing progressive lower extremity swelling initially starting around ankles progressing to the thighs.  For the last for 5 days patient has had progressive abdominal distention as well and for last 2 days he has been having exertional shortness of breath.  Shortness of breath has gotten so bad that the patient is requiring 6 L/min of oxygen at home to be comfortable and is still short of breath now.  There is no history of any fever cough expectoration chest pain palpitation or similar complaints in the past. Patient has mild genrealised abd disocmfort. No aggravating/reliving factor. No dysuria.   ER workup is notable for patient requiring 6 L/min of supplementary oxygen, marked abdominal distention noted.  Medical evaluation is sought.  Assessment & Plan:   Principal Problem:   Anasarca Active Problems:   Colon cancer (HCC)   Nicotine abuse   Ascites   Esophageal varices (HCC)   Cirrhosis (HCC)   Cachexia (HCC)   Protein-calorie malnutrition, severe  Anasarca/cachexia Patient not chronically on diuretics. is noted to have systemic fluid overload manifesting as crackles heard on lung abdominal distention as well as edema, marked show seen on lower extremity. LFTs noted for slight elevation of bilirubin, albumin is low.  INR is elevated.  CAT scan is already showing cirrhosis changes of the liver. Troponin is within normal limits. Patient was given 1 dose of Lasix 40 mg followed by 5 mg/h infusion after that.  Monitor intake output and daily weights.  He has had decent diuresis.   Dietitian is consulted.  Acute encephalopathy/?  Acute hepatic encephalopathy/delirium: Patient was only slightly confused yesterday.  Ammonia was found to be elevated indicating of acute hepatic encephalopathy contributing, lactulose started.  However overnight, patient was more confused and agitated, requiring Haldol.  He is now having superimposed delirium on top of acute hepatic encephalopathy.  Will continue with lactulose and start on Seroquel tonight. 1. Avoid benzodiazepines, antihistamines, anticholinergics, and minimize opiate use as these may worsen delirium. 2: Assess, prevent and manage pain as lack of treatment can result in delirium.  3: Provide appropriate lighting and clear signage; a clock and calendar should be easily visible to the patient. 4: Monitor environmental factors. Reduce light and noise at night (close shades, turn off lights, turn off TV, ect). Correct any alterations in sleep cycle. 5: Reorient the patient to person, place, time and situation on each encounter.  6: Correct sensory deficits if possible (replace eye glasses, hearing aids, ect). 7: Avoid restraints if able. Severely delirious patients benefit from constant observation by a sitter.  Hypokalemia: Resolved   Acute on chronic hypoxic respiratory failure: Chest x-ray shows possibility of infiltrates.  Atelectasis may also be the cause due to abdominal distention.  Patient is afebrile so doubt pneumonia.  Echo showed grade 1 diastolic dysfunction, systolic function is normal.  Respiratory status has improved post paracentesis, currently on room air.  Encourage incentive spirometry.   Liver cirrhosis (HCC)/ascites/hypotension New Diagnosis based on CT scan demonstration, finding of ascites. Thrombocytoepnia.  Ferritin normal, viral hepatitis panel ruled out.  IR is consulted patient underwent diagnostic and therapeutic paracentesis 09/11/2023 with 5 L out.  Slightly hypotensive post paracentesis, will give him  albumin x 3 doses today.  Chronic alcoholism: Patient told me that he drinks 3 beers a day for last more than 25 years.  The send confirmed that the patient has been drinking since he was born and he is 83 years old.  However he has cut down since 2 months since he was diagnosed with colon cancer.   Esophageal varices (HCC) Seen incidentally on CT abd pelvis. Started nadolol 20 mg daily . Uptitrate as able.   Nicotine abuse Current. Advised to quit especially due to oxygen use. Nicotine replacement being ordered   Colon cancer [facility]) Based on onc note 08/03/2023:   Principle Diagnosis:  Stage IIIB ([id number]) adenocarcinoma of the right middle lung History of stage Ia ([id number]) adenocarcinoma the right lung - resected in March 2011 History of stage II adenocarcinoma of the colon-high risk - April 2010 Metastatic adenocarcinoma of the lung --retroperitoneal nodal metastasis and new left lung nodule  --high PD-L1   Past Therapy:        Carboplatin/Alimta/Pembrolizumab - started 05/10/2020- s/p cycle 6 Radiation with weekly carboplatin/Taxol-start on 11/01/2020 -- completed on 12/20/2020 Yervoy/Opdivo --s/p cycle #4  -- start on 09/15/2021   Current Therapy:  Nivolumab -- maintenance -- s/p cycle #10 -- start on 01/17/2022 -- d/c on 08/03/2023   Patient has been getting radiation therapy since then - I am not sure to which part of the body however.   Anxiety -patient reports taking 0.5 mg every 8 hours as needed Ativan for anxiety.  Admitting hospitalist has reduced the dosage to 0.25 mg as given cirrhosis, however with him having delirium, I would discontinue benzodiazepines.  Anemia of chronic disease: Hemoglobin at baseline between 9-10.  Currently at baseline.  Thrombocytopenia: This is also chronic.  No signs of bleeding.  Stable.  Monitor.  Severe protein calorie malnutrition: Seen by dietitian.  Plan is to watch his p.o. intake for next 1 to 2 days, if p.o. intake remains poor, they  suggested tube feedings through [employer].  DVT prophylaxis: enoxaparin (LOVENOX) injection 40 mg Start: 09/12/23 0800 SCDs Start: 09/11/23 0008   Code Status: Full Code  Family Communication:  None present at bedside.  Discussed in length with the son and answered several questions.  Status is: Inpatient Remains inpatient appropriate because: Delirious/confused  Estimated body mass index is 23.2 kg/m as calculated from the following:   Height as of 08/13/23: 5\' 8"  (1.727 m).   Weight as of this encounter: 69.2 kg.  Pressure Injury 09/11/23 Heel Bilateral Stage 1 -  Intact skin with non-blanchable redness of a localized area usually over a bony prominence. Reddend area non-blanchable (Active)  09/11/23 1815  Location: Heel  Location Orientation: Bilateral  Staging: Stage 1 -  Intact skin with non-blanchable redness of a localized area usually over a bony prominence.  Wound Description (Comments): Reddend area non-blanchable  Present on Admission: Yes  Dressing Type Foam - Lift dressing to assess site every shift 09/12/23 0741     Pressure Injury 09/11/23 Buttocks Bilateral Stage 1 -  Intact skin with non-blanchable redness of a localized area usually over a bony prominence. reddend area non-blanchable (Active)  09/11/23 1815  Location: Buttocks  Location Orientation: Bilateral  Staging: Stage 1 -  Intact skin with non-blanchable redness of a localized area usually over a bony prominence.  Wound Description (Comments): reddend area non-blanchable  Present on Admission: Yes  Dressing Type Foam - Lift dressing to assess site every shift 09/12/23 0741   Nutritional Assessment: Body mass index is 23.2 kg/m.Marland Kitchen Seen by dietician.  I agree with the assessment and plan as outlined below: Nutrition Status: Nutrition Problem: Severe Malnutrition Etiology: chronic illness Signs/Symptoms: severe muscle depletion, severe fat depletion, energy intake < 75% for > 7 days Interventions: Refer to  RD note for recommendations  . Skin Assessment: I have examined the patient's skin and I agree with the wound assessment as performed by the wound care RN as outlined below: Pressure Injury 09/11/23 Heel Bilateral Stage 1 -  Intact skin with non-blanchable redness of a localized area usually over a bony prominence. Reddend area non-blanchable (Active)  09/11/23 1815  Location: Heel  Location Orientation: Bilateral  Staging: Stage 1 -  Intact skin with non-blanchable redness of a localized area usually over a bony prominence.  Wound Description (Comments): Reddend area non-blanchable  Present on Admission: Yes  Dressing Type Foam - Lift dressing to assess site every shift 09/12/23 0741     Pressure Injury 09/11/23 Buttocks Bilateral Stage 1 -  Intact skin with non-blanchable redness of a localized area usually over a bony prominence. reddend area non-blanchable (Active)  09/11/23 1815  Location: Buttocks  Location Orientation: Bilateral  Staging: Stage 1 -  Intact skin with non-blanchable redness of a localized area usually over a bony prominence.  Wound Description (Comments): reddend area non-blanchable  Present on Admission: Yes  Dressing Type Foam - Lift dressing to assess site every shift 09/12/23 0741    Consultants:  None  Procedures:  None  Antimicrobials:  Anti-infectives (From admission, onward)    None         Subjective: Patient seen and examined.  He was in mittens and soft wrist restraints.  Patient was alert and oriented to self, month and the year but he could not tell me the place.  However he was able to tell me that he has been drinking 3 beers daily for more than 25 years.  Objective: Vitals:   09/12/23 0542 09/12/23 0741 09/12/23 1100 09/12/23 1202  BP: 107/78 (!) 95/52  (!) 99/47  Pulse: 75 79  75  Resp: 19 18  20   Temp:  97.6 F (36.4 C) 98.1 F (36.7 C) 98.1 F (36.7 C)  TempSrc:  Oral Oral Oral  SpO2:  100%  100%  Weight:    69.2 kg     Intake/Output Summary (Last 24 hours) at 09/12/2023 1305 Last data filed at 09/12/2023 0317 Gross per 24 hour  Intake 118.06 ml  Output 3475 ml  Net -3356.94 ml   Filed Weights   09/12/23 1202  Weight: 69.2 kg    Examination:  General exam: Appears calm and comfortable, with mittens and soft restraints Respiratory system: Clear to auscultation. Respiratory effort normal. Cardiovascular system: S1 & S2 heard, RRR. No JVD, murmurs, rubs, gallops or clicks. No pedal edema. Gastrointestinal system: Abdomen is nondistended, soft and nontender. No organomegaly or masses felt. Normal bowel sounds heard. Central nervous system: Alert and oriented x 2. No focal neurological deficits. Extremities: Symmetric 5 x 5 power. Skin: No rashes, lesions or ulcers.  Psychiatry: Judgement and insight appear poor   Data Reviewed: I have personally reviewed following labs and imaging studies  CBC: Recent Labs  Lab 09/10/23 2020 09/11/23 0136 09/11/23 0415 09/12/23 0316  WBC 8.3 7.1 6.0 13.7*  NEUTROABS 6.6  --   --  11.2*  HGB 10.4* 9.9* 9.4* 10.6*  HCT 32.0* 30.1* 29.0* 31.9*  MCV 102.2* 101.3* 102.5* 99.4  PLT 104* 96* 92* 132*   Basic Metabolic Panel: Recent Labs  Lab 09/10/23 2020 09/11/23 0136 09/11/23 0415 09/12/23 0316  NA 137  --  141 139  K 3.0*  --  4.1 3.6  CL 111  --  115* 107  CO2 20*  --  20* 23  GLUCOSE 122*  --  116* 95  BUN 17  --  16 19  CREATININE 0.93 0.95 0.91 1.08  CALCIUM 7.8*  --  7.7* 7.7*  MG  --   --  2.0 1.9  PHOS  --   --  2.5  --    GFR: Estimated Creatinine Clearance: 51 mL/min (by C-G formula based on SCr of 1.08 mg/dL). Liver Function Tests: Recent Labs  Lab 09/10/23 2020 09/11/23 0415 09/12/23 0316  AST 18 17 21   ALT 15 12 13   ALKPHOS 85 70 72  BILITOT 1.6* 1.8* 2.8*  PROT 5.3* 5.0* 4.9*  ALBUMIN 2.2* 2.1* 2.1*   No results for input(s): "LIPASE", "AMYLASE" in the last 168 hours. Recent Labs  Lab 09/11/23 1029 09/12/23 0526   AMMONIA 42* 32   Coagulation Profile: Recent Labs  Lab 09/11/23 0136 09/11/23 0415  INR 1.2 1.3*   Cardiac Enzymes: No results for input(s): "CKTOTAL", "CKMB", "CKMBINDEX", "TROPONINI" in the last 168 hours. BNP (last 3 results) No results for input(s): "PROBNP" in the last 8760 hours. HbA1C: No results for input(s): "HGBA1C" in the last 72 hours. CBG: No results for input(s): "GLUCAP" in the last 168 hours. Lipid Profile: No results for input(s): "CHOL", "HDL", "LDLCALC", "TRIG", "CHOLHDL", "LDLDIRECT" in the last 72 hours. Thyroid Function Tests: No results for input(s): "TSH", "T4TOTAL", "FREET4", "T3FREE", "THYROIDAB" in the last 72 hours. Anemia Panel: Recent Labs    09/11/23 0415  FERRITIN 149   Sepsis Labs: Recent Labs  Lab 09/12/23 0316  PROCALCITON 0.22    Recent Results (from the past 240 hours)  Resp panel by RT-PCR (RSV, Flu A&B, Covid) Anterior Nasal Swab     Status: None   Collection Time: 09/10/23  8:20 PM   Specimen: Anterior Nasal Swab  Result Value Ref Range Status   SARS Coronavirus 2 by RT PCR NEGATIVE NEGATIVE Final   Influenza A by PCR NEGATIVE NEGATIVE Final   Influenza B by PCR NEGATIVE NEGATIVE Final    Comment: (NOTE) The Xpert Xpress SARS-CoV-2/FLU/RSV plus assay is intended as an aid in the diagnosis of influenza from Nasopharyngeal swab specimens and should not be used as a sole basis for treatment. Nasal washings and aspirates are unacceptable for Xpert Xpress SARS-CoV-2/FLU/RSV testing.  Fact Sheet for Patients: BloggerCourse.com  Fact Sheet for Healthcare Providers: SeriousBroker.it  This test is not yet approved or cleared by the [place] FDA and has been authorized for detection and/or diagnosis of SARS-CoV-2 by FDA under an Emergency Use Authorization (EUA). This EUA will remain in effect (meaning this test can be used) for the duration of the COVID-19 declaration  under Section 564(b)(1) of the Act, 21 U.S.C. section 360bbb-3(b)(1), unless the authorization is terminated or revoked.     Resp Syncytial Virus by PCR NEGATIVE NEGATIVE Final    Comment: (NOTE) Fact Sheet for Patients: BloggerCourse.com  Fact Sheet for Healthcare Providers: SeriousBroker.it  This test is not yet approved or cleared by the [place] FDA and has been authorized for detection and/or diagnosis of SARS-CoV-2 by FDA under  an Emergency Use Authorization (EUA). This EUA will remain in effect (meaning this test can be used) for the duration of the COVID-19 declaration under Section 564(b)(1) of the Act, 21 U.S.C. section 360bbb-3(b)(1), unless the authorization is terminated or revoked.  Performed at [facility], [address], [place], [place] [address]   Culture, body fluid w Gram Stain-bottle     Status: None (Preliminary result)   Collection Time: 09/11/23 11:09 AM   Specimen: Peritoneal Washings  Result Value Ref Range Status   Specimen Description PERITONEAL  Final   Special Requests NONE  Final   Culture   Final    NO GROWTH < 24 HOURS Performed at [facility], [address] [place], [place] [address]    Report Status PENDING  Incomplete  Gram stain     Status: None   Collection Time: 09/11/23 11:09 AM   Specimen: Peritoneal Washings  Result Value Ref Range Status   Specimen Description PERITONEAL  Final   Special Requests NONE  Final   Gram Stain   Final    WBC PRESENT,BOTH PMN AND MONONUCLEAR NO ORGANISMS SEEN CYTOSPIN SMEAR Performed at [facility], [address], [place], [place] [address]    Report Status 09/11/2023 FINAL  Final     Radiology Studies: IR Paracentesis Result Date: 09/11/2023 INDICATION: Patient with a history of metastatic lung cancer presents today with ascites. Interventional radiology asked to perform a diagnostic and therapeutic paracentesis.  EXAM: ULTRASOUND GUIDED PARACENTESIS MEDICATIONS: 1% lidocaine 10 mL COMPLICATIONS: None immediate. PROCEDURE: Informed written consent was obtained from the patient after a discussion of the risks, benefits and alternatives to treatment. A timeout was performed prior to the initiation of the procedure. Initial ultrasound scanning demonstrates a large amount of ascites within the right lower abdominal quadrant. The right lower abdomen was prepped and draped in the usual sterile fashion. 1% lidocaine was used for local anesthesia. Following this, a 19 gauge, 7-cm, Yueh catheter was introduced. An ultrasound image was saved for documentation purposes. The paracentesis was performed. The catheter was removed and a dressing was applied. The patient tolerated the procedure well without immediate post procedural complication. Patient received post-procedure intravenous albumin; see nursing notes for details. FINDINGS: A total of approximately 5 L of clear yellow fluid was removed. Samples were sent to the laboratory as requested by the clinical team. IMPRESSION: Successful ultrasound-guided paracentesis yielding 5 liters of peritoneal fluid. Procedure performed by Alwyn Ren NP Electronically Signed   By: Marliss Coots M.D.   On: 09/11/2023 20:49   ECHOCARDIOGRAM COMPLETE Result Date: 09/11/2023    ECHOCARDIOGRAM REPORT   Patient Name:   SHRIHAAN PORZIO Date of Exam: 09/11/2023 Medical Rec #:  [id number]    Height:       68.0 in Accession #:    [id number]   Weight:       161.0 lb Date of Birth:  May 01, 1941    BSA:          1.864 m Patient Age:    82 years     BP:           104/70 mmHg Patient Gender: M            HR:           65 bpm. Exam Location:  Inpatient Procedure: 2D Echo, Cardiac Doppler and Color Doppler Indications:    I50.40* Unspecified combined systolic (congestive) and diastolic                 (  congestive) heart failure  History:        Patient has no prior history of Echocardiogram examinations.                  Risk Factors:Current Smoker and Hypertension. Lung cancer.  Sonographer:    Sheralyn Boatman RDCS Referring Phys: [id number] [facility] GOEL  Sonographer Comments: Image acquisition challenging due to uncooperative patient. Patient rolled away from probe and said I'm getting water, I tried to tell him I need to finish exam, he said he was going to "run me out" Exam was ended. IMPRESSIONS  1. Left ventricular ejection fraction, by estimation, is 55%. The left ventricle has normal function. The left ventricle has no regional wall motion abnormalities. There is mild concentric left ventricular hypertrophy. Left ventricular diastolic parameters are consistent with Grade I diastolic dysfunction (impaired relaxation).  2. Right ventricular systolic function is normal. The right ventricular size is normal. Tricuspid regurgitation signal is inadequate for assessing PA pressure.  3. The mitral valve is normal in structure. Mild mitral valve regurgitation. No evidence of mitral stenosis.  4. The aortic valve is tricuspid. There is mild calcification of the aortic valve. Aortic valve regurgitation is not visualized. No aortic stenosis is present.  5. Aortic dilatation noted. There is mild dilatation of the aortic root, measuring 40 mm.  6. IVC not visualized. FINDINGS  Left Ventricle: Left ventricular ejection fraction, by estimation, is 55%. The left ventricle has normal function. The left ventricle has no regional wall motion abnormalities. The left ventricular internal cavity size was normal in size. There is mild concentric left ventricular hypertrophy. Left ventricular diastolic parameters are consistent with Grade I diastolic dysfunction (impaired relaxation). Right Ventricle: The right ventricular size is normal. No increase in right ventricular wall thickness. Right ventricular systolic function is normal. Tricuspid regurgitation signal is inadequate for assessing PA pressure. Left Atrium: Left atrial size was normal in size.  Right Atrium: Right atrial size was normal in size. Pericardium: There is no evidence of pericardial effusion. Mitral Valve: The mitral valve is normal in structure. Mild mitral annular calcification. Mild mitral valve regurgitation. No evidence of mitral valve stenosis. Tricuspid Valve: The tricuspid valve is normal in structure. Tricuspid valve regurgitation is not demonstrated. Aortic Valve: The aortic valve is tricuspid. There is mild calcification of the aortic valve. Aortic valve regurgitation is not visualized. No aortic stenosis is present. Pulmonic Valve: The pulmonic valve was normal in structure. Pulmonic valve regurgitation is trivial. Aorta: Aortic dilatation noted. There is mild dilatation of the aortic root, measuring 40 mm. Venous: The inferior vena cava was not well visualized. IAS/Shunts: No atrial level shunt detected by color flow Doppler.  LEFT VENTRICLE PLAX 2D LVIDd:         4.40 cm     Diastology LVIDs:         3.20 cm     LV e' medial:    4.03 cm/s LV PW:         1.20 cm     LV E/e' medial:  12.0 LV IVS:        1.10 cm     LV e' lateral:   5.44 cm/s LVOT diam:     2.50 cm     LV E/e' lateral: 8.9 LV SV:         85 LV SV Index:   46 LVOT Area:     4.91 cm  LV Volumes (MOD) LV vol d, MOD A4C: 35.7 ml LV vol  s, MOD A4C: 11.8 ml LV SV MOD A4C:     35.7 ml RIGHT VENTRICLE RV S prime:     10.54 cm/s TAPSE (M-mode): 2.0 cm LEFT ATRIUM             Index        RIGHT ATRIUM           Index LA diam:        2.80 cm 1.50 cm/m   RA Area:     11.20 cm LA Vol (A2C):   20.2 ml 10.84 ml/m  RA Volume:   20.90 ml  11.21 ml/m LA Vol (A4C):   10.5 ml 5.63 ml/m LA Biplane Vol: 16.0 ml 8.58 ml/m  AORTIC VALVE             PULMONIC VALVE LVOT Vmax:   84.40 cm/s  PR End Diast Vel: 1.25 msec LVOT Vmean:  52.800 cm/s LVOT VTI:    0.174 m  AORTA Ao Root diam: 4.00 cm Ao Asc diam:  3.70 cm MITRAL VALVE MV Area (PHT): 3.17 cm    SHUNTS MV Decel Time: 239 msec    Systemic VTI:  0.17 m MV E velocity: 48.50 cm/s   Systemic Diam: 2.50 cm MV A velocity: 81.60 cm/s MV E/A ratio:  0.59 Dalton McleanMD Electronically signed by Wilfred Lacy Signature Date/Time: 09/11/2023/2:02:15 PM    Final    CT ABDOMEN PELVIS W CONTRAST Result Date: 09/10/2023 CLINICAL DATA:  History of lung cancer, on chronic oxygen, left-sided abdominal pain for 2 weeks. Shortness of breath. EXAM: CT ABDOMEN AND PELVIS WITH CONTRAST TECHNIQUE: Multidetector CT imaging of the abdomen and pelvis was performed using the standard protocol following bolus administration of intravenous contrast. RADIATION DOSE REDUCTION: This exam was performed according to the departmental dose-optimization program which includes automated exposure control, adjustment of the mA and/or kV according to patient size and/or use of iterative reconstruction technique. CONTRAST:  75mL OMNIPAQUE IOHEXOL 350 MG/ML SOLN COMPARISON:  Same day chest radiograph and PET/CT 06/26/2023 FINDINGS: Lower chest: Scarring/atelectasis in the lower lungs. No acute abnormality. Hepatobiliary: Shrunken liver with nodular contour compatible with cirrhosis. Portal vein is patent. Gallbladder and biliary tree are unremarkable. Pancreas: Unremarkable. Spleen: Borderline splenomegaly measuring 14.4 cm in craniocaudal dimension. Adrenals/Urinary Tract: Normal adrenal glands. No urinary calculi or hydronephrosis. The bladder is unremarkable. Stomach/Bowel: Small hiatal hernia. Diffuse wall thickening about the small bowel. Additional wall thickening about the transverse colon postoperative change about the right colon with ileocolic anastomosis at the hepatic flexure. Vascular/Lymphatic: Aortic atherosclerosis. Paraesophageal varices. No enlarged abdominal or pelvic lymph nodes. Reproductive: No acute abnormality. Other: Large volume abdominopelvic ascites. No free intraperitoneal air. Musculoskeletal: No acute fracture.  Right THA.  Body wall edema. IMPRESSION: 1. Cirrhosis with sequela of portal  hypertension including large volume abdominopelvic ascites, borderline splenomegaly, and paraesophageal varices. 2. Diffuse wall thickening about the small bowel and transverse colon, likely due to portal enteropathy/colopathy. Enterocolitis could appear similarly. Aortic Atherosclerosis (ICD10-I70.0). Electronically Signed   By: Minerva Fester M.D.   On: 09/10/2023 23:13   DG Chest Portable 1 View Result Date: 09/10/2023 CLINICAL DATA:  Shortness of breath. History of lung cancer. Left-sided abdominal pain. EXAM: PORTABLE CHEST 1 VIEW COMPARISON:  Head CT 06/26/2023 FINDINGS: Right chest port in place. Volume loss in the right hemithorax is likely post treatment related change, right hilar surgical clips. Heart is upper normal in size. There are calcified mediastinal and hilar lymph nodes. Suggestion of streaky left lung base  opacity. No pulmonary edema, large pleural effusion or pneumothorax. Left pleural calcifications on prior PET are not well seen by radiograph. Skin fold projects over the left hemithorax. IMPRESSION: 1. Suggestion of streaky left lung base opacity, may represent atelectasis or pneumonia. 2. Volume loss in the right hemithorax is likely post treatment related change. Electronically Signed   By: Narda Rutherford M.D.   On: 09/10/2023 21:11    Scheduled Meds:  aspirin  81 mg Oral Daily   Chlorhexidine Gluconate Cloth  6 each Topical Daily   enoxaparin (LOVENOX) injection  40 mg Subcutaneous Q24H   feeding supplement  237 mL Oral BID BM   Gerhardt's butt cream   Topical Daily   lactulose  30 g Oral BID   levothyroxine  50 mcg Oral QAC breakfast   multivitamin with minerals  1 tablet Oral Daily   nadolol  20 mg Oral Daily   potassium chloride SA  20 mEq Oral Daily   QUEtiapine  25 mg Oral Q24H   sodium chloride flush  10-40 mL Intracatheter Q12H   sodium chloride flush  3 mL Intravenous Q12H   thiamine  100 mg Oral BID   Continuous Infusions:  albumin human 12.5 g (09/12/23  0936)   furosemide (LASIX) 200 mg in dextrose 5 % 100 mL (2 mg/mL) infusion 10 mg/hr (09/12/23 0539)     LOS: 1 day   Hughie Closs, MD [facility]  09/12/2023, 1:05 PM   *Please note that this is a verbal dictation therefore any spelling or grammatical errors are due to the "Dragon Medical One" system interpretation.  Please page via Amion and do not message via secure chat for urgent patient care matters. Secure chat can be used for non urgent patient care matters.  How to contact the [facility] Attending or Consulting provider 7A - 7P or covering provider during after hours 7P -7A, for this patient?  Check the care team in [facility] and look for a) attending/consulting TR[facility] provider listed and b) the [facility] team listed. Page or secure chat 7A-7P. Log into [email or web address] and use [facility] universal password to access. If you do not have the password, please contact the hospital operator. Locate the [facility] provider you are looking for under [facility] and page to a number that you can be directly reached. If you still have difficulty reaching the provider, please page the [facility] (Director on Call) for the Hospitalists listed on amion for assistance.

## 2023-09-13 DIAGNOSIS — R601 Generalized edema: Secondary | ICD-10-CM | POA: Diagnosis not present

## 2023-09-13 LAB — COMPREHENSIVE METABOLIC PANEL
ALT: 11 U/L (ref 0–44)
AST: 17 U/L (ref 15–41)
Albumin: 2.3 g/dL — ABNORMAL LOW (ref 3.5–5.0)
Alkaline Phosphatase: 61 U/L (ref 38–126)
Anion gap: 9 (ref 5–15)
BUN: 28 mg/dL — ABNORMAL HIGH (ref 8–23)
CO2: 25 mmol/L (ref 22–32)
Calcium: 7.6 mg/dL — ABNORMAL LOW (ref 8.9–10.3)
Chloride: 105 mmol/L (ref 98–111)
Creatinine, Ser: 1.32 mg/dL — ABNORMAL HIGH (ref 0.61–1.24)
GFR, Estimated: 54 mL/min — ABNORMAL LOW (ref >60)
Glucose, Bld: 115 mg/dL — ABNORMAL HIGH (ref 70–99)
Potassium: 2.8 mmol/L — ABNORMAL LOW (ref 3.5–5.1)
Sodium: 139 mmol/L (ref 135–145)
Total Bilirubin: 1.6 mg/dL — ABNORMAL HIGH (ref 0.0–1.2)
Total Protein: 4.7 g/dL — ABNORMAL LOW (ref 6.5–8.1)

## 2023-09-13 LAB — POTASSIUM: Potassium: 3.2 mmol/L — ABNORMAL LOW (ref 3.5–5.1)

## 2023-09-13 LAB — MAGNESIUM
Magnesium: 1.9 mg/dL (ref 1.7–2.4)
Magnesium: 1.9 mg/dL (ref 1.7–2.4)

## 2023-09-13 LAB — PHOSPHORUS
Phosphorus: 2.7 mg/dL (ref 2.5–4.6)
Phosphorus: 2.6 mg/dL (ref 2.5–4.6)

## 2023-09-13 LAB — LACTIC ACID, PLASMA: Lactic Acid, Venous: 1.7 mmol/L (ref 0.5–1.9)

## 2023-09-13 LAB — AMMONIA: Ammonia: 41 umol/L — ABNORMAL HIGH (ref 9–35)

## 2023-09-13 MED ORDER — MIDODRINE HCL 5 MG PO TABS
5.0000 mg | ORAL_TABLET | Freq: Three times a day (TID) | ORAL | Status: DC
  Administered 2023-09-13 – 2023-09-14 (×3): 5 mg via ORAL
  Filled 2023-09-13 (×3): qty 1

## 2023-09-13 MED ORDER — ALBUMIN HUMAN 25 % IV SOLN
25.0000 g | Freq: Once | INTRAVENOUS | Status: AC
  Administered 2023-09-13: 12.5 g via INTRAVENOUS
  Filled 2023-09-13: qty 100

## 2023-09-13 MED ORDER — POTASSIUM CHLORIDE CRYS ER 20 MEQ PO TBCR
40.0000 meq | EXTENDED_RELEASE_TABLET | ORAL | Status: AC
  Administered 2023-09-13 (×3): 40 meq via ORAL
  Filled 2023-09-13 (×3): qty 2

## 2023-09-13 MED ORDER — ALBUMIN HUMAN 25 % IV SOLN
12.5000 g | Freq: Four times a day (QID) | INTRAVENOUS | Status: DC
  Administered 2023-09-13 (×2): 12.5 g via INTRAVENOUS
  Filled 2023-09-13 (×2): qty 50

## 2023-09-13 MED ORDER — MIDODRINE HCL 5 MG PO TABS
5.0000 mg | ORAL_TABLET | Freq: Once | ORAL | Status: AC
  Administered 2023-09-13: 5 mg via ORAL
  Filled 2023-09-13: qty 1

## 2023-09-13 NOTE — Progress Notes (Signed)
1219- Pt BP 99/30 (52), HR 70, RR 20, SpO2 100% pm RA T98. Pt continues to be fatigue but easily arousable to voice oriented x 2. Dr. Jacqulyn Bath notified see new order for STAT lactic acid.   1330- Lactic acid resulted at 1.7 Dr. Jacqulyn Bath informed. See new order for midodrine.

## 2023-09-13 NOTE — Evaluation (Signed)
Physical Therapy Evaluation Patient Details Name: Jerome Kelley MRN: [id number] DOB: [birth year] Today's Date: 09/13/2023  History of Present Illness  83 y.o. male presented 09/10/23 with bil Leg and abdominal edema. +anasarca, 1/28 paracentesis, encephalopathy/agitation,   PMH significant of lung cancer, chronically uses 2 L/min of supplementary oxygen, colon cancer, cirrhosis, cachexia  Clinical Impression   Pt admitted secondary to problem above with deficits below. PTA patient was living at home with wife in a mobile home with an addition that requires 2 steps up with bil rails. Pt was ambulating occasionally with a cane (mostly without device). Pt currently with low BP (84/48) and unable to fully assess his mobility needs. Bed-level exercises completed as pt could tolerate. Discussed discharge plan and pt in agreement although he hopes to be able to go straight home. Anticipate patient will benefit from PT to address problems listed below.Will continue to follow acutely to maximize functional mobility independence and safety.  Patient will benefit from continued inpatient follow up therapy, <3 hours/day          If plan is discharge home, recommend the following: Two people to help with walking and/or transfers;Two people to help with bathing/dressing/bathroom;Assistance with cooking/housework;Assist for transportation;Help with stairs or ramp for entrance   Can travel by private vehicle   No    Equipment Recommendations Wheelchair (measurements PT);Wheelchair cushion (measurements PT)  Recommendations for Other Services       Functional Status Assessment Patient has had a recent decline in their functional status and demonstrates the ability to make significant improvements in function in a reasonable and predictable amount of time.     Precautions / Restrictions Precautions Precautions: Fall;Other (comment) Precaution Comments: fell at home just PTA; rectal pouch      Mobility   Bed Mobility               General bed mobility comments: unable to assess due to continuous diarrhea and gas into rectal pouch. Pt on his side and could not roll without dislodging pouch. Also could not sit EOB due to low BP 84/48 and pouch issues.    Transfers                        Ambulation/Gait                  Stairs            Wheelchair Mobility     Tilt Bed    Modified Rankin (Stroke Patients Only)       Balance                                             Pertinent Vitals/Pain Pain Assessment Pain Assessment: No/denies pain    Home Living Family/patient expects to be discharged to:: Private residence Living Arrangements: Spouse/significant other Available Help at Discharge: Family;Available 24 hours/day (wife on oxygen and can really only provide supervision) Type of Home: House Home Access: Level entry     Alternate Level Stairs-Number of Steps: 2 Home Layout: Multi-level;Bed/bath upstairs Home Equipment: Cane - single point;Shower seat      Prior Function Prior Level of Function : Independent/Modified Independent             Mobility Comments: occasionally uses cane; 1 fall PTA "just fell out"       Extremity/Trunk Assessment  Upper Extremity Assessment Upper Extremity Assessment: Generalized weakness    Lower Extremity Assessment Lower Extremity Assessment: Generalized weakness (difficult to assess in sidelying due to rectal pouch full of air; "milked" and immediately pt passing gas and it refills)    Cervical / Trunk Assessment Cervical / Trunk Assessment: Kyphotic;Other exceptions Cervical / Trunk Exceptions: cachectic  Communication   Communication Communication: No apparent difficulties  Cognition Arousal: Alert Behavior During Therapy: Flat affect Overall Cognitive Status: Impaired/Different from baseline Area of Impairment: Problem solving                              Problem Solving: Slow processing          General Comments      Exercises General Exercises - Lower Extremity Ankle Circles/Pumps: AROM, Both, 10 reps Hip ABduction/ADduction: AROM, Left, 5 reps, Sidelying Other Exercises Other Exercises: sidelying knee flexion x 5 reps each leg   Assessment/Plan    PT Assessment Patient needs continued PT services  PT Problem List Decreased strength;Decreased activity tolerance;Decreased mobility;Decreased knowledge of use of DME;Cardiopulmonary status limiting activity       PT Treatment Interventions DME instruction;Gait training;Functional mobility training;Therapeutic activities;Therapeutic exercise;Patient/family education;Cognitive remediation    PT Goals (Current goals can be found in the Care Plan section)  Acute Rehab PT Goals Patient Stated Goal: return home PT Goal Formulation: With patient Time For Goal Achievement: 09/27/23 Potential to Achieve Goals: Fair    Frequency Min 1X/week     Co-evaluation               AM-PAC PT "6 Clicks" Mobility  Outcome Measure Help needed turning from your back to your side while in a flat bed without using bedrails?: A Little Help needed moving from lying on your back to sitting on the side of a flat bed without using bedrails?: Total Help needed moving to and from a bed to a chair (including a wheelchair)?: Total Help needed standing up from a chair using your arms (e.g., wheelchair or bedside chair)?: Total Help needed to walk in hospital room?: Total Help needed climbing 3-5 steps with a railing? : Total 6 Click Score: 8    End of Session   Activity Tolerance: Treatment limited secondary to medical complications (Comment) (hypotension and rectal pouch filling to point of dislodging) Patient left: in bed;with call bell/phone within reach;with bed alarm set Nurse Communication: Mobility status;Other (comment) (rectal pouch full of air) PT Visit Diagnosis: Muscle weakness  (generalized) (M62.81)    Time: 1914-7829 PT Time Calculation (min) (ACUTE ONLY): 13 min   Charges:   PT Evaluation $PT Eval Low Complexity: 1 Low   PT General Charges $$ ACUTE PT VISIT: 1 Visit          Jerolyn Center, PT Acute Rehabilitation Services  Office [phone]   Zena Amos 09/13/2023, 1:58 PM

## 2023-09-13 NOTE — Progress Notes (Signed)
   09/13/23 1715  Charting Type  Focused Reassessment Changes Noted Respiratory  Respiratory  Respiratory Pattern Regular;Unlabored  Chest Assessment Chest expansion symmetrical  Bilateral Breath Sounds Coarse crackles;Expiratory wheezes (Dr. Jacqulyn Bath notified)  Cough Non-productive;Congested  Respiratory (WDL) X  Respiratory Interventions Cough and deep breathe w/ teach back  ECG Monitoring  ECG Heart Rate 63   Pt crackles worsening and now with expiratory wheezing. No changes to VS. Pt cont to be on RA with no signs of resp distress. Dr. Jacqulyn Bath notified. No new orders at this time.

## 2023-09-13 NOTE — Progress Notes (Addendum)
    Patient Name: Jerome Kelley           DOB: Dec 31, 1940  MRN: [id number]      Admission Date: 09/10/2023  Attending Provider: Hughie Closs, MD  Primary Diagnosis: Anasarca   Level of care: Telemetry Medical    CROSS COVER NOTE   Date of Service   09/13/2023   [name], 83 y.o. male, was admitted on 09/10/2023 for Anasarca.    HPI/Events of Note   Hypotensive, SBP 80-90s Alert, asymptomatic.  All other vital stable. BP borderline/ low earlier as well.  Lasix gtt was discontinued on 1/30.   Today, he received additional IV albumin and was started on midodrine.     Interventions/ Plan   Additional 5 mg midodrine ordered IV albumin x 1        Anthoney Harada, DNP, [employer] [facility] [employer]

## 2023-09-13 NOTE — Progress Notes (Addendum)
Nutrition Follow-up  DOCUMENTATION CODES:   Severe malnutrition in context of chronic illness  INTERVENTION:   Encourage PO intake, Room service with assist Ensure Enlive po BID, each supplement provides 350 kcal and 20 grams of protein. Magic cup TID with meals, each supplement provides 290 kcal and 9 grams of protein Multivitamin with minerals daily  100 mg Thiamine daily Monitor magnesium, potassium, and phosphorus BID MD to replete as needed, as pt is at risk for refeeding syndrome Patient meeting >60% of nutritional needs with mostly oral nutritional supplements. IF continued poor PO intake and decrease in oral nutritional supplements recommend nutrition support if within GOC  NUTRITION DIAGNOSIS:   Severe Malnutrition related to chronic illness as evidenced by severe muscle depletion, severe fat depletion, energy intake < 75% for > 7 days.  - Still applicable   GOAL:   Patient will meet greater than or equal to 90% of their needs  - Ongoing  MONITOR:   PO intake, Weight trends, Supplement acceptance, I & O's, Labs  REASON FOR ASSESSMENT:   Consult Assessment of nutrition requirement/status  ASSESSMENT:   83 y.o Male with PMH of lung cancer, colon cancer, tobacco use,on 2L oxygen at home, started treatment 04/2020 and has been getting radiation therapy recently. Found to have anasarca/cachexia, acute encephalopathy, hypoxic respiratory failure, new liver cirrhosis/ascites   1/27 - Admitted 1/28 - IR paracentesis of right abdomen, pulled 5L  Calorie Count note below. His intake alone without oral nutritional supplements is poor. However he is drinking at least 1 Ensure and 1-2 magic cups per day, these are making it so he is meeting at least 60% of his calorie and protein needs. Do not think he needs nutrition support at this time but if his intake continues to be poor and he stops taking oral nutritional supplements may want to reconsider if within GOC.   Patient's  mental status has improved. Breakfast tray noted at bedside, patient said he was hungry. Presented breakfast and helped patient with eating.   Potassium has been trending low, MD supplementing. Today Phos low. Needs another paracentesis. Palliative following for GOC.   Calorie Count  1/29: Breakfast: No meal ordered, not able to place order as room service appropriate  Lunch: 1/2 spaghetti with meat sauce, 1/4 chocolate pudding, 1/2 apple juice: 208 kcal, 7 gm protein  Dinner: 1/2 Pot roast, 1/4 vanilla wafers, 1/4 gingerale, 152 kcal, 9 gm protein  Supplements: 1 Ensure, 1 Magic cup 640 kcal, 29 gm protein  Total intake: 1000 kcal (62% of minimum estimated needs)  45 protein (50% of minimum estimated needs)  1/30: Breakfast: 1/2 [place] toast, orange juice, syrup 151 kcal, 2 gm protein Lunch:  1/2 [place] sandwich, vanilla wafers, gingerale 200 kcal, 9 gm protein Dinner: 1/4 meatloaf, cake, gingerale 100 kcal, 4 gm protein  Supplements: 1 Ensure, 2 Magic cup 930 kcal, 38 gm protein   Total intake: 1381 kcal (86% of minimum estimated needs)  53 gm protein (58% of minimum estimated needs)  Admit weight: 73 kg - -Elevated to due fluid  Current weight: 70.9 kg    Average Meal Intake: 1/29-1/30: 32% intake x 5 recorded meals  Nutritionally Relevant Medications: Scheduled Meds:  feeding supplement  237 mL Oral BID BM   lactulose  30 g Oral BID   levothyroxine  50 mcg Oral QAC breakfast   midodrine  10 mg Oral TID WC   multivitamin with minerals  1 tablet Oral Daily   nadolol  20 mg  Oral Daily   potassium chloride SA  20 mEq Oral Daily   thiamine  100 mg Oral BID   Labs Reviewed: BUN 26, Calcium 7.6, Phosphorus 1.9, BG ranges from 101-115 mg/dL over the last 24 hours  Diet Order:   Diet Order             Diet regular Room service appropriate? Yes with Assist; Fluid consistency: Thin; Fluid restriction: 1800 mL Fluid  Diet effective now                   EDUCATION  NEEDS:   Not appropriate for education at this time  Skin:  Skin Assessment: Skin Integrity Issues: Skin Integrity Issues:: Stage I Stage I: Heel and buttocks  Last BM:  09/14/2023 type 6  Height:   Ht Readings from Last 1 Encounters:  08/13/23 5\' 8"  (1.727 m)    Weight:   Wt Readings from Last 1 Encounters:  09/14/23 70.9 kg    Ideal Body Weight:  70 kg  BMI:  Body mass index is 23.77 kg/m.  Estimated Nutritional Needs:   Kcal:  1600-1800 kcal  Protein:  90-120 kcal  Fluid:  >1.6L  Elliot Dally, RD Registered Dietitian  See Amion for more information

## 2023-09-13 NOTE — Progress Notes (Signed)
Order for additional dose of midodrine and albumin ordered    09/13/23 2000  Vitals  BP (!) 92/42 Virgel Manifold, NP notified)  MAP (mmHg) (!) 58  Pulse Rate 74  ECG Heart Rate 75  Resp 18  MEWS COLOR  MEWS Score Color Green  Oxygen Therapy  SpO2 98 %

## 2023-09-13 NOTE — Progress Notes (Signed)
PROGRESS NOTE    Jerome Kelley  ZOX:[id number] DOB: [birth year] DOA: 09/10/2023 PCP: Irven Coe, MD   Brief Narrative:  HPI: Jerome Kelley is a 83 y.o. male with medical history significant of lung cancer.  He also has metastatic adenocarcinoma of the lung.   Patient chronically uses 2 L/min of supplementary oxygen.  Patient was in his usual state of health till about 4 weeks ago.  Since that time patient is describing progressive lower extremity swelling initially starting around ankles progressing to the thighs.  For the last for 5 days patient has had progressive abdominal distention as well and for last 2 days he has been having exertional shortness of breath.  Shortness of breath has gotten so bad that the patient is requiring 6 L/min of oxygen at home to be comfortable and is still short of breath now.  There is no history of any fever cough expectoration chest pain palpitation or similar complaints in the past. Patient has mild genrealised abd disocmfort. No aggravating/reliving factor. No dysuria.   ER workup is notable for patient requiring 6 L/min of supplementary oxygen, marked abdominal distention noted.  Medical evaluation is sought.  Assessment & Plan:   Principal Problem:   Anasarca Active Problems:   Colon cancer (HCC)   Nicotine abuse   Ascites   Esophageal varices (HCC)   Cirrhosis (HCC)   Cachexia (HCC)   Protein-calorie malnutrition, severe  Anasarca/cachexia Patient not chronically on diuretics. is noted to have systemic fluid overload manifesting as crackles heard on lung abdominal distention as well as edema, marked show seen on lower extremity. LFTs noted for slight elevation of bilirubin, albumin is low.  INR is elevated.  CAT scan is already showing cirrhosis changes of the liver. Troponin is within normal limits. Patient was given 1 dose of Lasix 40 mg followed by 5 mg/h infusion after that.  Monitor intake output and daily weights.  He has had decent diuresis  with net -5.2 L since admission +5 L of paracentesis fluid.  Creatinine jumping now so we will hold off further Lasix.   Acute encephalopathy/?  Acute hepatic encephalopathy/delirium: Ammonia slightly elevated despite of taking lactulose, patient's mental status improved somewhat today.  Continue nightly Seroquel. 1. Avoid benzodiazepines, antihistamines, anticholinergics, and minimize opiate use as these may worsen delirium. 2: Assess, prevent and manage pain as lack of treatment can result in delirium.  3: Provide appropriate lighting and clear signage; a clock and calendar should be easily visible to the patient. 4: Monitor environmental factors. Reduce light and noise at night (close shades, turn off lights, turn off TV, ect). Correct any alterations in sleep cycle. 5: Reorient the patient to person, place, time and situation on each encounter.  6: Correct sensory deficits if possible (replace eye glasses, hearing aids, ect). 7: Avoid restraints if able. Severely delirious patients benefit from constant observation by a sitter.  Hypokalemia: Very low at 2.8 today and again, will aggressively replenish.   Acute on chronic hypoxic respiratory failure: Chest x-ray shows possibility of infiltrates.  Atelectasis may also be the cause due to abdominal distention.  Patient is afebrile so doubt pneumonia.  Echo showed grade 1 diastolic dysfunction, systolic function is normal.  Respiratory status has improved post paracentesis, currently on room air.  Encourage incentive spirometry.   Liver cirrhosis (HCC)/ascites/hypotension New Diagnosis based on CT scan demonstration, finding of ascites. Thrombocytoepnia.  Ferritin normal, viral hepatitis panel ruled out.  IR is consulted patient underwent diagnostic and therapeutic paracentesis  09/11/2023 with 5 L out.  Gave him 3 doses of albumin 09/12/2023, blood pressure still on the low side with rising creatinine so we will repeat 3 more doses today.  AKI:  Creatinine jumped from 1.0-1.32 today.  Will avoid nephrotoxic agents, discontinue Lasix, repeat labs in the morning.  Chronic alcoholism: Patient told me that he drinks 3 beers a day for last more than 25 years.  The send confirmed that the patient has been drinking since he was born and he is 83 years old.  However he has cut down since 2 months since he was diagnosed with colon cancer.   Esophageal varices (HCC) Seen incidentally on CT abd pelvis. Started nadolol 20 mg daily . Uptitrate as able.   Nicotine abuse Current. Advised to quit especially due to oxygen use. Nicotine replacement being ordered   Colon cancer [facility]) Based on onc note 08/03/2023:   Principle Diagnosis:  Stage IIIB (I4PP2R5) adenocarcinoma of the right middle lung History of stage Ia (T1aN0M0) adenocarcinoma the right lung - resected in March 2011 History of stage II adenocarcinoma of the colon-high risk - April 2010 Metastatic adenocarcinoma of the lung --retroperitoneal nodal metastasis and new left lung nodule  --high PD-L1   Past Therapy:        Carboplatin/Alimta/Pembrolizumab - started 05/10/2020- s/p cycle 6 Radiation with weekly carboplatin/Taxol-start on 11/01/2020 -- completed on 12/20/2020 Yervoy/Opdivo --s/p cycle #4  -- start on 09/15/2021   Current Therapy:  Nivolumab -- maintenance -- s/p cycle #10 -- start on 01/17/2022 -- d/c on 08/03/2023   Patient has been getting radiation therapy since then - I am not sure to which part of the body however.   Anxiety -patient reports taking 0.5 mg every 8 hours as needed Ativan for anxiety.  Admitting hospitalist has reduced the dosage to 0.25 mg as given cirrhosis, however with him having delirium, benzodiazepines were discontinued 09/12/2023.  Anemia of chronic disease: Hemoglobin at baseline between 9-10.  Currently at baseline.  Thrombocytopenia: This is also chronic.  No signs of bleeding.  Stable.  Monitor.  Severe protein calorie malnutrition: Seen by  dietitian.  Plan is to watch his p.o. intake for next 1 to 2 days, if p.o. intake remains poor, they suggested tube feedings through [employer].  GOC: I believe due to multiple medical problems and new diagnosis of cirrhosis, patient appears to have poor short-term and long-term prognosis.  I have consulted palliative care to help lead GOC conversations with family.  DVT prophylaxis: enoxaparin (LOVENOX) injection 40 mg Start: 09/12/23 0800 SCDs Start: 09/11/23 0008   Code Status: Full Code  Family Communication:  None present at bedside.  Discussed in length with the son over the phone 09/12/2023  Status is: Inpatient Remains inpatient appropriate because: Delirious/confused  Estimated body mass index is 23.2 kg/m as calculated from the following:   Height as of 08/13/23: 5\' 8"  (1.727 m).   Weight as of this encounter: 69.2 kg.  Pressure Injury 09/11/23 Heel Bilateral Stage 1 -  Intact skin with non-blanchable redness of a localized area usually over a bony prominence. Reddend area non-blanchable (Active)  09/11/23 1815  Location: Heel  Location Orientation: Bilateral  Staging: Stage 1 -  Intact skin with non-blanchable redness of a localized area usually over a bony prominence.  Wound Description (Comments): Reddend area non-blanchable  Present on Admission: Yes  Dressing Type Foam - Lift dressing to assess site every shift 09/12/23 0741     Pressure Injury 09/11/23 Buttocks Bilateral Stage  1 -  Intact skin with non-blanchable redness of a localized area usually over a bony prominence. reddend area non-blanchable (Active)  09/11/23 1815  Location: Buttocks  Location Orientation: Bilateral  Staging: Stage 1 -  Intact skin with non-blanchable redness of a localized area usually over a bony prominence.  Wound Description (Comments): reddend area non-blanchable  Present on Admission: Yes  Dressing Type Foam - Lift dressing to assess site every shift 09/12/23 1305   Nutritional  Assessment: Body mass index is 23.2 kg/m.Marland Kitchen Seen by dietician.  I agree with the assessment and plan as outlined below: Nutrition Status: Nutrition Problem: Severe Malnutrition Etiology: chronic illness Signs/Symptoms: severe muscle depletion, severe fat depletion, energy intake < 75% for > 7 days Interventions: Refer to RD note for recommendations  . Skin Assessment: I have examined the patient's skin and I agree with the wound assessment as performed by the wound care RN as outlined below: Pressure Injury 09/11/23 Heel Bilateral Stage 1 -  Intact skin with non-blanchable redness of a localized area usually over a bony prominence. Reddend area non-blanchable (Active)  09/11/23 1815  Location: Heel  Location Orientation: Bilateral  Staging: Stage 1 -  Intact skin with non-blanchable redness of a localized area usually over a bony prominence.  Wound Description (Comments): Reddend area non-blanchable  Present on Admission: Yes  Dressing Type Foam - Lift dressing to assess site every shift 09/12/23 0741     Pressure Injury 09/11/23 Buttocks Bilateral Stage 1 -  Intact skin with non-blanchable redness of a localized area usually over a bony prominence. reddend area non-blanchable (Active)  09/11/23 1815  Location: Buttocks  Location Orientation: Bilateral  Staging: Stage 1 -  Intact skin with non-blanchable redness of a localized area usually over a bony prominence.  Wound Description (Comments): reddend area non-blanchable  Present on Admission: Yes  Dressing Type Foam - Lift dressing to assess site every shift 09/12/23 1305    Consultants:  None  Procedures:  None  Antimicrobials:  Anti-infectives (From admission, onward)    None         Subjective: Patient seen and examined.  Slightly drowsy but arousable.  He is more oriented today than yesterday.  Able to tell me his name, current month and the year.  Objective: Vitals:   09/12/23 2320 09/13/23 0308 09/13/23 0312  09/13/23 0729  BP: (!) 104/55 (!) 96/54 (!) 117/46 (!) 95/51  Pulse: 82  74 75  Resp: (!) 22  (!) 21 19  Temp:   98.2 F (36.8 C) 99.7 F (37.6 C)  TempSrc:   Oral Oral  SpO2: 100%  99% 99%  Weight:        Intake/Output Summary (Last 24 hours) at 09/13/2023 0808 Last data filed at 09/13/2023 0400 Gross per 24 hour  Intake 800.17 ml  Output 1200 ml  Net -399.83 ml   Filed Weights   09/12/23 1202  Weight: 69.2 kg    Examination:  General exam: Appears calm and comfortable and slightly drowsy Respiratory system: Clear to auscultation. Respiratory effort normal. Cardiovascular system: S1 & S2 heard, RRR. No JVD, murmurs, rubs, gallops or clicks. No pedal edema. Gastrointestinal system: Abdomen is nondistended, soft and nontender. No organomegaly or masses felt. Normal bowel sounds heard. Central nervous system: Alert and oriented . No focal neurological deficits. Extremities: Symmetric 5 x 5 power. Skin: No rashes, lesions or ulcers.  Psychiatry: Judgement and insight appear poor Data Reviewed: I have personally reviewed following labs and imaging studies  CBC: Recent Labs  Lab 09/10/23 2020 09/11/23 0136 09/11/23 0415 09/12/23 0316  WBC 8.3 7.1 6.0 13.7*  NEUTROABS 6.6  --   --  11.2*  HGB 10.4* 9.9* 9.4* 10.6*  HCT 32.0* 30.1* 29.0* 31.9*  MCV 102.2* 101.3* 102.5* 99.4  PLT 104* 96* 92* 132*   Basic Metabolic Panel: Recent Labs  Lab 09/10/23 2020 09/11/23 0136 09/11/23 0415 09/12/23 0316 09/13/23 0513  NA 137  --  141 139 139  K 3.0*  --  4.1 3.6 2.8*  CL 111  --  115* 107 105  CO2 20*  --  20* 23 25  GLUCOSE 122*  --  116* 95 115*  BUN 17  --  16 19 28*  CREATININE 0.93 0.95 0.91 1.08 1.32*  CALCIUM 7.8*  --  7.7* 7.7* 7.6*  MG  --   --  2.0 1.9 1.9  PHOS  --   --  2.5  --  2.7   GFR: Estimated Creatinine Clearance: 41.7 mL/min (A) (by C-G formula based on SCr of 1.32 mg/dL (H)). Liver Function Tests: Recent Labs  Lab 09/10/23 2020 09/11/23 0415  09/12/23 0316 09/13/23 0513  AST 18 17 21 17   ALT 15 12 13 11   ALKPHOS 85 70 72 61  BILITOT 1.6* 1.8* 2.8* 1.6*  PROT 5.3* 5.0* 4.9* 4.7*  ALBUMIN 2.2* 2.1* 2.1* 2.3*   No results for input(s): "LIPASE", "AMYLASE" in the last 168 hours. Recent Labs  Lab 09/11/23 1029 09/12/23 0526 09/13/23 0513  AMMONIA 42* 32 41*   Coagulation Profile: Recent Labs  Lab 09/11/23 0136 09/11/23 0415  INR 1.2 1.3*   Cardiac Enzymes: No results for input(s): "CKTOTAL", "CKMB", "CKMBINDEX", "TROPONINI" in the last 168 hours. BNP (last 3 results) No results for input(s): "PROBNP" in the last 8760 hours. HbA1C: No results for input(s): "HGBA1C" in the last 72 hours. CBG: No results for input(s): "GLUCAP" in the last 168 hours. Lipid Profile: No results for input(s): "CHOL", "HDL", "LDLCALC", "TRIG", "CHOLHDL", "LDLDIRECT" in the last 72 hours. Thyroid Function Tests: No results for input(s): "TSH", "T4TOTAL", "FREET4", "T3FREE", "THYROIDAB" in the last 72 hours. Anemia Panel: Recent Labs    09/11/23 0415  FERRITIN 149   Sepsis Labs: Recent Labs  Lab 09/12/23 0316  PROCALCITON 0.22    Recent Results (from the past 240 hours)  Resp panel by RT-PCR (RSV, Flu A&B, Covid) Anterior Nasal Swab     Status: None   Collection Time: 09/10/23  8:20 PM   Specimen: Anterior Nasal Swab  Result Value Ref Range Status   SARS Coronavirus 2 by RT PCR NEGATIVE NEGATIVE Final   Influenza A by PCR NEGATIVE NEGATIVE Final   Influenza B by PCR NEGATIVE NEGATIVE Final    Comment: (NOTE) The Xpert Xpress SARS-CoV-2/FLU/RSV plus assay is intended as an aid in the diagnosis of influenza from Nasopharyngeal swab specimens and should not be used as a sole basis for treatment. Nasal washings and aspirates are unacceptable for Xpert Xpress SARS-CoV-2/FLU/RSV testing.  Fact Sheet for Patients: BloggerCourse.com  Fact Sheet for Healthcare  Providers: SeriousBroker.it  This test is not yet approved or cleared by the [place] FDA and has been authorized for detection and/or diagnosis of SARS-CoV-2 by FDA under an Emergency Use Authorization (EUA). This EUA will remain in effect (meaning this test can be used) for the duration of the COVID-19 declaration under Section 564(b)(1) of the Act, 21 U.S.C. section 360bbb-3(b)(1), unless the authorization is terminated or revoked.  Resp Syncytial Virus by PCR NEGATIVE NEGATIVE Final    Comment: (NOTE) Fact Sheet for Patients: BloggerCourse.com  Fact Sheet for Healthcare Providers: SeriousBroker.it  This test is not yet approved or cleared by the [place] FDA and has been authorized for detection and/or diagnosis of SARS-CoV-2 by FDA under an Emergency Use Authorization (EUA). This EUA will remain in effect (meaning this test can be used) for the duration of the COVID-19 declaration under Section 564(b)(1) of the Act, 21 U.S.C. section 360bbb-3(b)(1), unless the authorization is terminated or revoked.  Performed at [facility], [address], [place], [place] [address]   Culture, body fluid w Gram Stain-bottle     Status: None (Preliminary result)   Collection Time: 09/11/23 11:09 AM   Specimen: Peritoneal Washings  Result Value Ref Range Status   Specimen Description PERITONEAL  Final   Special Requests NONE  Final   Culture   Final    NO GROWTH < 24 HOURS Performed at [facility], [address] [place], [place] [address]    Report Status PENDING  Incomplete  Gram stain     Status: None   Collection Time: 09/11/23 11:09 AM   Specimen: Peritoneal Washings  Result Value Ref Range Status   Specimen Description PERITONEAL  Final   Special Requests NONE  Final   Gram Stain   Final    WBC PRESENT,BOTH PMN AND MONONUCLEAR NO ORGANISMS SEEN CYTOSPIN SMEAR Performed  at [facility], [address], [place], [place] [address]    Report Status 09/11/2023 FINAL  Final     Radiology Studies: IR Paracentesis Result Date: 09/11/2023 INDICATION: Patient with a history of metastatic lung cancer presents today with ascites. Interventional radiology asked to perform a diagnostic and therapeutic paracentesis. EXAM: ULTRASOUND GUIDED PARACENTESIS MEDICATIONS: 1% lidocaine 10 mL COMPLICATIONS: None immediate. PROCEDURE: Informed written consent was obtained from the patient after a discussion of the risks, benefits and alternatives to treatment. A timeout was performed prior to the initiation of the procedure. Initial ultrasound scanning demonstrates a large amount of ascites within the right lower abdominal quadrant. The right lower abdomen was prepped and draped in the usual sterile fashion. 1% lidocaine was used for local anesthesia. Following this, a 19 gauge, 7-cm, Yueh catheter was introduced. An ultrasound image was saved for documentation purposes. The paracentesis was performed. The catheter was removed and a dressing was applied. The patient tolerated the procedure well without immediate post procedural complication. Patient received post-procedure intravenous albumin; see nursing notes for details. FINDINGS: A total of approximately 5 L of clear yellow fluid was removed. Samples were sent to the laboratory as requested by the clinical team. IMPRESSION: Successful ultrasound-guided paracentesis yielding 5 liters of peritoneal fluid. Procedure performed by Alwyn Ren NP Electronically Signed   By: Marliss Coots M.D.   On: 09/11/2023 20:49   ECHOCARDIOGRAM COMPLETE Result Date: 09/11/2023    ECHOCARDIOGRAM REPORT   Patient Name:   CULLAN LAUNER Date of Exam: 09/11/2023 Medical Rec #:  [id number]    Height:       68.0 in Accession #:    [id number]   Weight:       161.0 lb Date of Birth:  August 02, 1941    BSA:          1.864 m Patient Age:    82 years     BP:            104/70 mmHg Patient Gender: M  HR:           65 bpm. Exam Location:  Inpatient Procedure: 2D Echo, Cardiac Doppler and Color Doppler Indications:    I50.40* Unspecified combined systolic (congestive) and diastolic                 (congestive) heart failure  History:        Patient has no prior history of Echocardiogram examinations.                 Risk Factors:Current Smoker and Hypertension. Lung cancer.  Sonographer:    Sheralyn Boatman RDCS Referring Phys: [id number] [facility] GOEL  Sonographer Comments: Image acquisition challenging due to uncooperative patient. Patient rolled away from probe and said I'm getting water, I tried to tell him I need to finish exam, he said he was going to "run me out" Exam was ended. IMPRESSIONS  1. Left ventricular ejection fraction, by estimation, is 55%. The left ventricle has normal function. The left ventricle has no regional wall motion abnormalities. There is mild concentric left ventricular hypertrophy. Left ventricular diastolic parameters are consistent with Grade I diastolic dysfunction (impaired relaxation).  2. Right ventricular systolic function is normal. The right ventricular size is normal. Tricuspid regurgitation signal is inadequate for assessing PA pressure.  3. The mitral valve is normal in structure. Mild mitral valve regurgitation. No evidence of mitral stenosis.  4. The aortic valve is tricuspid. There is mild calcification of the aortic valve. Aortic valve regurgitation is not visualized. No aortic stenosis is present.  5. Aortic dilatation noted. There is mild dilatation of the aortic root, measuring 40 mm.  6. IVC not visualized. FINDINGS  Left Ventricle: Left ventricular ejection fraction, by estimation, is 55%. The left ventricle has normal function. The left ventricle has no regional wall motion abnormalities. The left ventricular internal cavity size was normal in size. There is mild concentric left ventricular hypertrophy. Left ventricular diastolic  parameters are consistent with Grade I diastolic dysfunction (impaired relaxation). Right Ventricle: The right ventricular size is normal. No increase in right ventricular wall thickness. Right ventricular systolic function is normal. Tricuspid regurgitation signal is inadequate for assessing PA pressure. Left Atrium: Left atrial size was normal in size. Right Atrium: Right atrial size was normal in size. Pericardium: There is no evidence of pericardial effusion. Mitral Valve: The mitral valve is normal in structure. Mild mitral annular calcification. Mild mitral valve regurgitation. No evidence of mitral valve stenosis. Tricuspid Valve: The tricuspid valve is normal in structure. Tricuspid valve regurgitation is not demonstrated. Aortic Valve: The aortic valve is tricuspid. There is mild calcification of the aortic valve. Aortic valve regurgitation is not visualized. No aortic stenosis is present. Pulmonic Valve: The pulmonic valve was normal in structure. Pulmonic valve regurgitation is trivial. Aorta: Aortic dilatation noted. There is mild dilatation of the aortic root, measuring 40 mm. Venous: The inferior vena cava was not well visualized. IAS/Shunts: No atrial level shunt detected by color flow Doppler.  LEFT VENTRICLE PLAX 2D LVIDd:         4.40 cm     Diastology LVIDs:         3.20 cm     LV e' medial:    4.03 cm/s LV PW:         1.20 cm     LV E/e' medial:  12.0 LV IVS:        1.10 cm     LV e' lateral:   5.44 cm/s LVOT diam:  2.50 cm     LV E/e' lateral: 8.9 LV SV:         85 LV SV Index:   46 LVOT Area:     4.91 cm  LV Volumes (MOD) LV vol d, MOD A4C: 35.7 ml LV vol s, MOD A4C: 11.8 ml LV SV MOD A4C:     35.7 ml RIGHT VENTRICLE RV S prime:     10.54 cm/s TAPSE (M-mode): 2.0 cm LEFT ATRIUM             Index        RIGHT ATRIUM           Index LA diam:        2.80 cm 1.50 cm/m   RA Area:     11.20 cm LA Vol (A2C):   20.2 ml 10.84 ml/m  RA Volume:   20.90 ml  11.21 ml/m LA Vol (A4C):   10.5 ml 5.63  ml/m LA Biplane Vol: 16.0 ml 8.58 ml/m  AORTIC VALVE             PULMONIC VALVE LVOT Vmax:   84.40 cm/s  PR End Diast Vel: 1.25 msec LVOT Vmean:  52.800 cm/s LVOT VTI:    0.174 m  AORTA Ao Root diam: 4.00 cm Ao Asc diam:  3.70 cm MITRAL VALVE MV Area (PHT): 3.17 cm    SHUNTS MV Decel Time: 239 msec    Systemic VTI:  0.17 m MV E velocity: 48.50 cm/s  Systemic Diam: 2.50 cm MV A velocity: 81.60 cm/s MV E/A ratio:  0.59 Dalton McleanMD Electronically signed by Wilfred Lacy Signature Date/Time: 09/11/2023/2:02:15 PM    Final     Scheduled Meds:  aspirin  81 mg Oral Daily   Chlorhexidine Gluconate Cloth  6 each Topical Daily   enoxaparin (LOVENOX) injection  40 mg Subcutaneous Q24H   feeding supplement  237 mL Oral BID BM   Gerhardt's butt cream   Topical Daily   lactulose  30 g Oral BID   levothyroxine  50 mcg Oral QAC breakfast   multivitamin with minerals  1 tablet Oral Daily   nadolol  20 mg Oral Daily   potassium chloride SA  20 mEq Oral Daily   potassium chloride  40 mEq Oral Q4H   QUEtiapine  25 mg Oral Q24H   sodium chloride flush  10-40 mL Intracatheter Q12H   sodium chloride flush  3 mL Intravenous Q12H   thiamine  100 mg Oral BID   Continuous Infusions:  albumin human     furosemide (LASIX) 200 mg in dextrose 5 % 100 mL (2 mg/mL) infusion 10 mg/hr (09/12/23 1630)     LOS: 2 days   Hughie Closs, MD [facility]  09/13/2023, 8:08 AM   *Please note that this is a verbal dictation therefore any spelling or grammatical errors are due to the "Dragon Medical One" system interpretation.  Please page via Amion and do not message via secure chat for urgent patient care matters. Secure chat can be used for non urgent patient care matters.  How to contact the [facility] Attending or Consulting provider 7A - 7P or covering provider during after hours 7P -7A, for this patient?  Check the care team in [facility] and look for a) attending/consulting [facility] provider listed and b) the [facility] team  listed. Page or secure chat 7A-7P. Log into [email or web address] and use [facility] universal password to access. If you do not have the password, please contact the hospital  operator. Locate the [facility] provider you are looking for under [facility] and page to a number that you can be directly reached. If you still have difficulty reaching the provider, please page the [facility] (Director on Call) for the Hospitalists listed on amion for assistance.

## 2023-09-14 ENCOUNTER — Inpatient Hospital Stay: Payer: Medicare Other

## 2023-09-14 ENCOUNTER — Telehealth: Payer: Self-pay | Admitting: *Deleted

## 2023-09-14 ENCOUNTER — Inpatient Hospital Stay: Payer: Medicare Other | Admitting: Hematology & Oncology

## 2023-09-14 ENCOUNTER — Inpatient Hospital Stay (HOSPITAL_COMMUNITY): Payer: Medicare Other

## 2023-09-14 DIAGNOSIS — R601 Generalized edema: Secondary | ICD-10-CM | POA: Diagnosis not present

## 2023-09-14 LAB — BASIC METABOLIC PANEL
Anion gap: 6 (ref 5–15)
BUN: 26 mg/dL — ABNORMAL HIGH (ref 8–23)
CO2: 24 mmol/L (ref 22–32)
Calcium: 7.6 mg/dL — ABNORMAL LOW (ref 8.9–10.3)
Chloride: 110 mmol/L (ref 98–111)
Creatinine, Ser: 1.01 mg/dL (ref 0.61–1.24)
GFR, Estimated: >60 mL/min (ref >60)
Glucose, Bld: 101 mg/dL — ABNORMAL HIGH (ref 70–99)
Potassium: 3.7 mmol/L (ref 3.5–5.1)
Sodium: 140 mmol/L (ref 135–145)

## 2023-09-14 LAB — CBC WITH DIFFERENTIAL/PLATELET
Abs Immature Granulocytes: 0.02 10*3/uL (ref 0.00–0.07)
Abs Immature Granulocytes: 0.02 10*3/uL (ref 0.00–0.07)
Basophils Absolute: 0 10*3/uL (ref 0.0–0.1)
Basophils Absolute: 0 10*3/uL (ref 0.0–0.1)
Basophils Relative: 0 %
Basophils Relative: 0 %
Eosinophils Absolute: 0 10*3/uL (ref 0.0–0.5)
Eosinophils Absolute: 0.1 10*3/uL (ref 0.0–0.5)
Eosinophils Relative: 1 %
Eosinophils Relative: 1 %
HCT: 22.1 % — ABNORMAL LOW (ref 39.0–52.0)
HCT: 23.9 % — ABNORMAL LOW (ref 39.0–52.0)
Hemoglobin: 7.4 g/dL — ABNORMAL LOW (ref 13.0–17.0)
Hemoglobin: 8 g/dL — ABNORMAL LOW (ref 13.0–17.0)
Immature Granulocytes: 0 %
Immature Granulocytes: 0 %
Lymphocytes Relative: 12 %
Lymphocytes Relative: 10 %
Lymphs Abs: 0.5 10*3/uL — ABNORMAL LOW (ref 0.7–4.0)
Lymphs Abs: 0.5 10*3/uL — ABNORMAL LOW (ref 0.7–4.0)
MCH: 33.6 pg (ref 26.0–34.0)
MCH: 33.8 pg (ref 26.0–34.0)
MCHC: 33.5 g/dL (ref 30.0–36.0)
MCHC: 33.5 g/dL (ref 30.0–36.0)
MCV: 100.5 fL — ABNORMAL HIGH (ref 80.0–100.0)
MCV: 100.8 fL — ABNORMAL HIGH (ref 80.0–100.0)
Monocytes Absolute: 0.5 10*3/uL (ref 0.1–1.0)
Monocytes Absolute: 0.6 10*3/uL (ref 0.1–1.0)
Monocytes Relative: 12 %
Monocytes Relative: 11 %
Neutro Abs: 3.3 10*3/uL (ref 1.7–7.7)
Neutro Abs: 3.9 10*3/uL (ref 1.7–7.7)
Neutrophils Relative %: 75 %
Neutrophils Relative %: 78 %
Platelets: 76 10*3/uL — ABNORMAL LOW (ref 150–400)
Platelets: 82 10*3/uL — ABNORMAL LOW (ref 150–400)
RBC: 2.2 MIL/uL — ABNORMAL LOW (ref 4.22–5.81)
RBC: 2.37 MIL/uL — ABNORMAL LOW (ref 4.22–5.81)
RDW: 16.9 % — ABNORMAL HIGH (ref 11.5–15.5)
RDW: 17.1 % — ABNORMAL HIGH (ref 11.5–15.5)
WBC: 4.5 10*3/uL (ref 4.0–10.5)
WBC: 5 10*3/uL (ref 4.0–10.5)
nRBC: 0 % (ref 0.0–0.2)
nRBC: 0 % (ref 0.0–0.2)

## 2023-09-14 LAB — PHOSPHORUS
Phosphorus: 1.9 mg/dL — ABNORMAL LOW (ref 2.5–4.6)
Phosphorus: 2.2 mg/dL — ABNORMAL LOW (ref 2.5–4.6)

## 2023-09-14 LAB — MAGNESIUM
Magnesium: 1.9 mg/dL (ref 1.7–2.4)
Magnesium: 2.1 mg/dL (ref 1.7–2.4)

## 2023-09-14 LAB — IRON AND TIBC: Iron: 18 ug/dL — ABNORMAL LOW (ref 45–182)

## 2023-09-14 LAB — FOLATE: Folate: 5.3 ng/mL — ABNORMAL LOW (ref >5.9)

## 2023-09-14 LAB — RETICULOCYTES
Immature Retic Fract: 19.4 % — ABNORMAL HIGH (ref 2.3–15.9)
RBC.: 2.38 MIL/uL — ABNORMAL LOW (ref 4.22–5.81)
Retic Count, Absolute: 75.2 10*3/uL (ref 19.0–186.0)
Retic Ct Pct: 3.2 % — ABNORMAL HIGH (ref 0.4–3.1)

## 2023-09-14 LAB — POTASSIUM: Potassium: 3.7 mmol/L (ref 3.5–5.1)

## 2023-09-14 LAB — VITAMIN B12: Vitamin B-12: 747 pg/mL (ref 180–914)

## 2023-09-14 LAB — OCCULT BLOOD X 1 CARD TO LAB, STOOL: Fecal Occult Bld: NEGATIVE

## 2023-09-14 LAB — TRANSFERRIN: Transferrin: <70 mg/dL — ABNORMAL LOW (ref 180–329)

## 2023-09-14 MED ORDER — FOLIC ACID 1 MG PO TABS
1.0000 mg | ORAL_TABLET | Freq: Every day | ORAL | Status: DC
  Administered 2023-09-14 – 2023-09-16 (×3): 1 mg via ORAL
  Filled 2023-09-14 (×3): qty 1

## 2023-09-14 MED ORDER — K PHOS MONO-SOD PHOS DI & MONO 155-852-130 MG PO TABS
250.0000 mg | ORAL_TABLET | Freq: Two times a day (BID) | ORAL | Status: DC
  Administered 2023-09-14 – 2023-09-16 (×5): 250 mg via ORAL
  Filled 2023-09-14 (×6): qty 1

## 2023-09-14 MED ORDER — MIDODRINE HCL 5 MG PO TABS
10.0000 mg | ORAL_TABLET | Freq: Three times a day (TID) | ORAL | Status: DC
  Administered 2023-09-14 – 2023-09-16 (×7): 10 mg via ORAL
  Filled 2023-09-14 (×8): qty 2

## 2023-09-14 NOTE — Evaluation (Signed)
Occupational Therapy Evaluation Patient Details Name: Jerome Kelley MRN: [id number] DOB: Oct 20, 1940 Today's Date: 09/14/2023   History of Present Illness 83 y.o. male presented 09/10/23 with bil Leg and abdominal edema. +anasarca, 1/28 paracentesis, encephalopathy/agitation,   PMH significant of lung cancer, chronically uses 2 L/min of supplementary oxygen, colon cancer, cirrhosis, cachexia   Clinical Impression   PT admitted with s/p fall with abdominal distention. Pt currently with functional limitiations due to the deficits listed below (see OT problem list). Pt was walking with cane and indep with adls. Pt at this time requires (A) for basic bed mobility and decreased BP for upright posture. Pt high risk for skin bread down and educated on repositioning frequently for skin integrity. Pt ending session on L side lying position. Recommendation for palliative care consult. Attempting to call son unsuccessfully and notified RN that number is stating "number can not completed as dialed" so question accuracy of number. Pt will benefit from skilled OT to increase their independence and safety with adls and balance to allow discharge skilled inpatient follow up therapy, <3 hours/day unless family can give 24/7 at home.        If plan is discharge home, recommend the following: Other (comment) (stand pivot transfer to w/c)    Functional Status Assessment  Patient has had a recent decline in their functional status and demonstrates the ability to make significant improvements in function in a reasonable and predictable amount of time.  Equipment Recommendations  Wheelchair (measurements OT);Wheelchair cushion (measurements OT);Hospital bed (air mattress overlay)    Recommendations for Other Services Other (comment) (palliative team)     Precautions / Restrictions Precautions Precautions: Fall;Other (comment)      Mobility Bed Mobility Overal bed mobility: Needs Assistance Bed Mobility:  Rolling, Supine to Sit, Sit to Supine Rolling: Min assist   Supine to sit: Min assist Sit to supine: Mod assist   General bed mobility comments: pt with (A) to elevate trunk from bed surface and requires (A) to place bil LE back on bed surface.    Transfers Overall transfer level: Needs assistance   Transfers: Sit to/from Stand Sit to Stand: Mod assist           General transfer comment: pt powere dup with a posture bias and unable to sustain standing. pt return to sitting and noted to have decreased BP with SCD on. recommenation for ted hose      Balance Overall balance assessment: Needs assistance Sitting-balance support: Bilateral upper extremity supported, Feet supported Sitting balance-Leahy Scale: Poor   Postural control: Posterior lean Standing balance support: Bilateral upper extremity supported, During functional activity, Reliant on assistive device for balance Standing balance-Leahy Scale: Poor                             ADL either performed or assessed with clinical judgement   ADL Overall ADL's : Needs assistance/impaired Eating/Feeding: Moderate assistance;Bed level   Grooming: Moderate assistance;Bed level   Upper Body Bathing: Maximal assistance;Bed level   Lower Body Bathing: Total assistance;Bed level       Lower Body Dressing: Maximal assistance;Bed level                 General ADL Comments: pt supine on arrival and agreeable to OOB. pt with SCD sustained due to prior noted of lower BP. pt with SCD demonstrates inability to sustain EOB static standing and return to sitting with BP 81/45 with dizziness.  pt reuqesting to return to supine     Vision         Perception         Praxis         Pertinent Vitals/Pain Pain Assessment Pain Assessment: No/denies pain     Extremity/Trunk Assessment Upper Extremity Assessment Upper Extremity Assessment: Generalized weakness   Lower Extremity Assessment Lower Extremity  Assessment: Generalized weakness   Cervical / Trunk Assessment Cervical / Trunk Assessment: Kyphotic;Other exceptions Cervical / Trunk Exceptions: cachectic   Communication Communication Communication: No apparent difficulties   Cognition Arousal: Alert Behavior During Therapy: Flat affect Overall Cognitive Status: Impaired/Different from baseline Area of Impairment: Problem solving                             Problem Solving: Slow processing       General Comments  high risk for skin break down    Exercises     Shoulder Instructions      Home Living Family/patient expects to be discharged to:: Private residence Living Arrangements: Spouse/significant other Available Help at Discharge: Family;Available 24 hours/day Type of Home: House Home Access: Level entry     Home Layout: Multi-level;Bed/bath upstairs Alternate Level Stairs-Number of Steps: 2 Alternate Level Stairs-Rails: Right;Left;Can reach both Bathroom Shower/Tub: Producer, television/film/video: Standard     Home Equipment: Cane - single point;Shower seat          Prior Functioning/Environment Prior Level of Function : Independent/Modified Independent             Mobility Comments: occasionally uses cane; 1 fall PTA "just fell out"          OT Problem List: Decreased strength;Decreased activity tolerance;Impaired balance (sitting and/or standing);Decreased safety awareness;Decreased knowledge of use of DME or AE;Decreased knowledge of precautions;Cardiopulmonary status limiting activity      OT Treatment/Interventions: Self-care/ADL training;Energy conservation;DME and/or AE instruction;Manual therapy;Therapeutic activities;Balance training;Patient/family education    OT Goals(Current goals can be found in the care plan section) Acute Rehab OT Goals Patient Stated Goal: to take nap OT Goal Formulation: With patient Time For Goal Achievement: 09/28/23 Potential to Achieve  Goals: Fair  OT Frequency: Min 1X/week    Co-evaluation              AM-PAC OT "6 Clicks" Daily Activity     Outcome Measure Help from another person eating meals?: A Lot Help from another person taking care of personal grooming?: A Lot Help from another person toileting, which includes using toliet, bedpan, or urinal?: A Lot Help from another person bathing (including washing, rinsing, drying)?: A Lot Help from another person to put on and taking off regular upper body clothing?: A Lot Help from another person to put on and taking off regular lower body clothing?: A Lot 6 Click Score: 12   End of Session Nurse Communication: Mobility status;Precautions  Activity Tolerance: Treatment limited secondary to medical complications (Comment) (decr BP) Patient left: in bed;with call bell/phone within reach;with bed alarm set;with SCD's reapplied  OT Visit Diagnosis: Unsteadiness on feet (R26.81);Muscle weakness (generalized) (M62.81)                Time: 1610-9604 OT Time Calculation (min): 26 min Charges:  OT General Charges $OT Visit: 1 Visit OT Evaluation $OT Eval Moderate Complexity: 1 Mod   Brynn, OTR/L  Acute Rehabilitation Services Office: [phone] .   Mateo Flow 09/14/2023, 1:28 PM

## 2023-09-14 NOTE — Procedures (Signed)
Patient presents for therapeutic paracentesis [name] shows trace amount of fluid noted in abdomen. Insufficient to perform a safe paracentesis. Procedure not performed.   Thank you for allowing our service to participate in Jerome Kelley 's care.  Electronically Signed: Loman Brooklyn, PA-C   09/14/2023, 2:34 PM

## 2023-09-14 NOTE — [place] FL2 (Signed)
[place] MEDICAID FL2 LEVEL OF CARE FORM     IDENTIFICATION  Patient Name: Jerome Kelley Birthdate: 1941-05-23 Sex: male Admission Date (Current Location): 09/10/2023  [facility] and [place] Number:  [employer] and Address:  [facility] [address], [place], [place] [address]      Provider Number: [id number]  Attending Physician Name and Address:  Hughie Closs, MD  Relative Name and Phone Number:       Current Level of Care: Hospital Recommended Level of Care: Skilled Nursing Facility Prior Approval Number:    Date Approved/Denied:   PASRR Number:    Discharge Plan: SNF    Current Diagnoses: Patient Active Problem List   Diagnosis Date Noted   Protein-calorie malnutrition, severe 09/12/2023   Esophageal varices (HCC) 09/11/2023   Cirrhosis (HCC) 09/11/2023   Cachexia (HCC) 09/11/2023   Nicotine abuse 09/10/2023   Ascites 09/10/2023   Anasarca 09/10/2023   COPD, mild (HCC) 01/24/2021   Port-A-Cath in place 10/29/2020   Malignant neoplasm of upper lobe of right lung (HCC) 04/30/2020   Goals of care, counseling/discussion 03/15/2020   Hip fracture (HCC) 10/18/2018   Essential hypertension 10/18/2018   Closed fracture of neck of right femur (HCC)    Colon cancer (HCC) 09/18/2011    Orientation RESPIRATION BLADDER Height & Weight     Self  Normal Continent Weight: 156 lb 4.9 oz (70.9 kg) Height:     BEHAVIORAL SYMPTOMS/MOOD NEUROLOGICAL BOWEL NUTRITION STATUS      Incontinent Diet (please see discharge summary)  AMBULATORY STATUS COMMUNICATION OF NEEDS Skin   Limited Assist Verbally Other (Comment) (pressue injury heel bilateral stage I, Pressire injury Bilateral Buttocks stage I)                       Personal Care Assistance Level of Assistance  Bathing, Feeding, Dressing Bathing Assistance: Limited assistance Feeding assistance: Limited assistance Dressing Assistance: Limited assistance     Functional  Limitations Info  Sight, Hearing, Speech Sight Info: Adequate Hearing Info: Adequate Speech Info: Impaired (difficult speaking)    SPECIAL CARE FACTORS FREQUENCY  PT (By licensed PT), OT (By licensed OT)     PT Frequency: 5x per week OT Frequency: 5x per week            Contractures Contractures Info: Not present    Additional Factors Info  Code Status, Allergies Code Status Info: FULL Allergies Info: NKA           Current Medications (09/14/2023):  This is the current hospital active medication list Current Facility-Administered Medications  Medication Dose Route Frequency Provider Last Rate Last Admin   acetaminophen (TYLENOL) tablet 650 mg  650 mg Oral Q6H PRN Nolberto Hanlon, MD       Or   acetaminophen (TYLENOL) suppository 650 mg  650 mg Rectal Q6H PRN Nolberto Hanlon, MD       aspirin chewable tablet 81 mg  81 mg Oral Daily Nolberto Hanlon, MD   81 mg at 09/14/23 4696   Chlorhexidine Gluconate Cloth 2 % PADS 6 each  6 each Topical Daily Nolberto Hanlon, MD   6 each at 09/14/23 0813   enoxaparin (LOVENOX) injection 40 mg  40 mg Subcutaneous Q24H Nolberto Hanlon, MD   40 mg at 09/14/23 2952   feeding supplement (ENSURE ENLIVE / ENSURE PLUS) liquid 237 mL  237 mL Oral BID BM Hughie Closs, MD   237 mL at 09/14/23 [phone]  fentaNYL (SUBLIMAZE) injection 50 mcg  50 mcg Intravenous Q1H PRN Nolberto Hanlon, MD   50 mcg at 09/10/23 2033   folic acid (FOLVITE) tablet 1 mg  1 mg Oral Daily Hughie Closs, MD   1 mg at 09/14/23 1219   Gerhardt's butt cream   Topical Daily Hughie Closs, MD   Given at 09/14/23 1610   lactulose (CHRONULAC) 10 GM/15ML solution 30 g  30 g Oral BID Hughie Closs, MD   30 g at 09/14/23 9604   levothyroxine (SYNTHROID) tablet 50 mcg  50 mcg Oral QAC breakfast Nolberto Hanlon, MD   50 mcg at 09/14/23 5409   midodrine (PROAMATINE) tablet 10 mg  10 mg Oral TID WC Hughie Closs, MD   10 mg at 09/14/23 1219   multivitamin with minerals tablet 1 tablet  1 tablet Oral Daily Hughie Closs,  MD   1 tablet at 09/14/23 8119   nadolol (CORGARD) tablet 20 mg  20 mg Oral Daily Nolberto Hanlon, MD   20 mg at 09/14/23 1478   nicotine polacrilex (NICORETTE) gum 2 mg  2 mg Oral PRN Nolberto Hanlon, MD       phosphorus (K PHOS NEUTRAL) tablet 250 mg  250 mg Oral BID Hughie Closs, MD   250 mg at 09/14/23 0905   polyethylene glycol (MIRALAX / GLYCOLAX) packet 17 g  17 g Oral Daily PRN Nolberto Hanlon, MD       potassium chloride SA (KLOR-CON M) CR tablet 20 mEq  20 mEq Oral Daily Nolberto Hanlon, MD   20 mEq at 09/14/23 2956   QUEtiapine (SEROQUEL) tablet 25 mg  25 mg Oral Q24H Hughie Closs, MD   25 mg at 09/13/23 2104   sodium chloride flush (NS) 0.9 % injection 10-40 mL  10-40 mL Intracatheter Q12H Nolberto Hanlon, MD   10 mL at 09/14/23 0813   sodium chloride flush (NS) 0.9 % injection 3 mL  3 mL Intravenous Conchita Paris, MD   3 mL at 09/14/23 0813   thiamine (VITAMIN B1) tablet 100 mg  100 mg Oral BID Nolberto Hanlon, MD   100 mg at 09/14/23 2130     Discharge Medications: Please see discharge summary for a list of discharge medications.  Relevant Imaging Results:  Relevant Lab Results:   Additional Information SSN [id number]  Eduard Roux, LCSW

## 2023-09-14 NOTE — Progress Notes (Signed)
PROGRESS NOTE    Jerome Kelley  WUJ:[id number] DOB: 04/05/1941 DOA: 09/10/2023 PCP: Irven Coe, MD   Brief Narrative:  HPI: Jerome Kelley is a 83 y.o. male with medical history significant of lung cancer.  He also has metastatic adenocarcinoma of the lung.   Patient chronically uses 2 L/min of supplementary oxygen.  Patient was in his usual state of health till about 4 weeks ago.  Since that time patient is describing progressive lower extremity swelling initially starting around ankles progressing to the thighs.  For the last for 5 days patient has had progressive abdominal distention as well and for last 2 days he has been having exertional shortness of breath.  Shortness of breath has gotten so bad that the patient is requiring 6 L/min of oxygen at home to be comfortable and is still short of breath now.  There is no history of any fever cough expectoration chest pain palpitation or similar complaints in the past. Patient has mild genrealised abd disocmfort. No aggravating/reliving factor. No dysuria.   ER workup is notable for patient requiring 6 L/min of supplementary oxygen, marked abdominal distention noted.  Medical evaluation is sought.  Assessment & Plan:   Principal Problem:   Anasarca Active Problems:   Colon cancer (HCC)   Nicotine abuse   Ascites   Esophageal varices (HCC)   Cirrhosis (HCC)   Cachexia (HCC)   Protein-calorie malnutrition, severe  Anasarca/cachexia Patient not chronically on diuretics. is noted to have systemic fluid overload manifesting as crackles heard on lung and abdominal distention as well as edema. LFTs noted for slight elevation of bilirubin, albumin is low.  INR is elevated.  CAT scan is already showing cirrhosis changes of the liver. Troponin is within normal limits. Patient was given 1 dose of Lasix 40 mg followed by 5 mg/h infusion after that.  Monitor intake output and daily weights.  He has had decent diuresis with net -5.2 L since admission +5 L  of paracentesis fluid.  Due to rising creatinine, Lasix drip was discontinued 09/13/2023.  Acute encephalopathy/?  Acute hepatic encephalopathy/delirium: Ammonia slightly elevated yesterday despite of taking lactulose.  Patient's mental status has improved, he is fully alert and oriented so will not check ammonia today.  Continue nightly Seroquel. 1. Avoid benzodiazepines, antihistamines, anticholinergics, and minimize opiate use as these may worsen delirium. 2: Assess, prevent and manage pain as lack of treatment can result in delirium.  3: Provide appropriate lighting and clear signage; a clock and calendar should be easily visible to the patient. 4: Monitor environmental factors. Reduce light and noise at night (close shades, turn off lights, turn off TV, ect). Correct any alterations in sleep cycle. 5: Reorient the patient to person, place, time and situation on each encounter.  6: Correct sensory deficits if possible (replace eye glasses, hearing aids, ect). 7: Avoid restraints if able. Severely delirious patients benefit from constant observation by a sitter.  Hypokalemia: Replenished and resolved.   Acute on chronic hypoxic respiratory failure: Chest x-ray shows possibility of infiltrates.  Atelectasis may also be the cause due to abdominal distention.  Patient is afebrile so doubt pneumonia.  Echo showed grade 1 diastolic dysfunction, systolic function is normal.  Respiratory status has improved post paracentesis, currently on room air.  Encourage incentive spirometry.   Liver cirrhosis (HCC)/ascites/hypotension New Diagnosis based on CT scan demonstration, finding of ascites. Thrombocytoepnia.  Ferritin normal, viral hepatitis panel ruled out.  IR is consulted patient underwent diagnostic and therapeutic paracentesis 09/11/2023  with 5 L out.  Gave him 3 doses of albumin 09/12/2023, blood pressure remains low and creatinine was rising so he received another 2 units of albumin on 09/13/2023 making  a total of 5 doses.  Due to crackles noted in the afternoon of 09/13/2023, 6th dose of albumin was discontinued.  Patient does endorse some shortness of breath after asking but appears very comfortable.  He does have crackles on examination and has slightly more fluid shift/ascites today as well.  Due to low blood pressure, will not give Lasix, I have consulted IR again for another potential therapeutic paracentesis.  Blood pressure is low, started on midodrine 5 mg yesterday, will bump to 10 mg today.  AKI: Creatinine jumped from 1.0-1.32 yesterday and back to normal today.  Avoid nephrotoxic agents.  Chronic alcoholism: Patient told me that he drinks 3 beers a day for last more than 25 years.  The send confirmed that the patient has been drinking since he was born and he is 83 years old.  However he has cut down since 2 months since he was diagnosed with colon cancer.   Esophageal varices (HCC) Seen incidentally on CT abd pelvis. Started nadolol 20 mg daily . Uptitrate as able.   Nicotine abuse Current. Advised to quit especially due to oxygen use. Nicotine replacement being ordered   Colon cancer [facility]) Based on onc note 08/03/2023:   Principle Diagnosis:  Stage IIIB ([id number]) adenocarcinoma of the right middle lung History of stage Ia (T1aN0M0) adenocarcinoma the right lung - resected in March 2011 History of stage II adenocarcinoma of the colon-high risk - April 2010 Metastatic adenocarcinoma of the lung --retroperitoneal nodal metastasis and new left lung nodule  --high PD-L1   Past Therapy:        Carboplatin/Alimta/Pembrolizumab - started 05/10/2020- s/p cycle 6 Radiation with weekly carboplatin/Taxol-start on 11/01/2020 -- completed on 12/20/2020 Yervoy/Opdivo --s/p cycle #4  -- start on 09/15/2021   Current Therapy:  Nivolumab -- maintenance -- s/p cycle #10 -- start on 01/17/2022 -- d/c on 08/03/2023   Patient has been getting radiation therapy since then - I am not sure to which  part of the body however.   Anxiety -patient reports taking 0.5 mg every 8 hours as needed Ativan for anxiety.  Admitting hospitalist has reduced the dosage to 0.25 mg as given cirrhosis, however with him having delirium, benzodiazepines were discontinued 09/12/2023.  Anemia of chronic disease: Hemoglobin at baseline between 9-10.  Hemoglobin this morning dropped to 7.4.  I noticed that white blood cells and platelets also appear to have dropped.  Raises concern for possible error in the lab.  Repeating CBC.  Checking iron studies, B12, folate and FOBT.  He is at high risk of GI bleeding due to esophageal varices.  Thrombocytopenia: This is also chronic.  No signs of bleeding.  Stable.  Monitor.  Severe protein calorie malnutrition: Seen by dietitian.  Plan is to watch his p.o. intake for next 1 to 2 days, if p.o. intake remains poor, they suggested tube feedings through [employer].  GOC: I believe due to multiple medical problems and new diagnosis of cirrhosis, patient appears to have poor short-term and long-term prognosis.  I have consulted palliative care to help lead GOC conversations with family.  Hypophosphatemia: Replenished.  CODE STATUS: Now that patient is fully alert and oriented, discussed with him about CODE STATUS, he was very clear that he does not want to be full code and wanted to be DNR.  Changed  to DNR per patient's wishes.  DVT prophylaxis: enoxaparin (LOVENOX) injection 40 mg Start: 09/12/23 0800 SCDs Start: 09/11/23 0008   Code Status: Limited: Do not attempt resuscitation (DNR) -DNR-LIMITED -Do Not Intubate/DNI   Family Communication:  None present at bedside.  Discussed in length with the son over the phone 09/12/2023  Status is: Inpatient Remains inpatient appropriate because: Very weak, needs another paracentesis, hemoglobin also dropping.  Estimated body mass index is 23.77 kg/m as calculated from the following:   Height as of 08/13/23: 5\' 8"  (1.727 m).   Weight as  of this encounter: 70.9 kg.  Pressure Injury 09/11/23 Heel Bilateral Stage 1 -  Intact skin with non-blanchable redness of a localized area usually over a bony prominence. Reddend area non-blanchable (Active)  09/11/23 1815  Location: Heel  Location Orientation: Bilateral  Staging: Stage 1 -  Intact skin with non-blanchable redness of a localized area usually over a bony prominence.  Wound Description (Comments): Reddend area non-blanchable  Present on Admission: Yes  Dressing Type Foam - Lift dressing to assess site every shift 09/14/23 0800     Pressure Injury 09/11/23 Buttocks Bilateral Stage 1 -  Intact skin with non-blanchable redness of a localized area usually over a bony prominence. reddend area non-blanchable (Active)  09/11/23 1815  Location: Buttocks  Location Orientation: Bilateral  Staging: Stage 1 -  Intact skin with non-blanchable redness of a localized area usually over a bony prominence.  Wound Description (Comments): reddend area non-blanchable  Present on Admission: Yes  Dressing Type Foam - Lift dressing to assess site every shift 09/14/23 0800   Nutritional Assessment: Body mass index is 23.77 kg/m.Marland Kitchen Seen by dietician.  I agree with the assessment and plan as outlined below: Nutrition Status: Nutrition Problem: Severe Malnutrition Etiology: chronic illness Signs/Symptoms: severe muscle depletion, severe fat depletion, energy intake < 75% for > 7 days Interventions: Refer to RD note for recommendations  . Skin Assessment: I have examined the patient's skin and I agree with the wound assessment as performed by the wound care RN as outlined below: Pressure Injury 09/11/23 Heel Bilateral Stage 1 -  Intact skin with non-blanchable redness of a localized area usually over a bony prominence. Reddend area non-blanchable (Active)  09/11/23 1815  Location: Heel  Location Orientation: Bilateral  Staging: Stage 1 -  Intact skin with non-blanchable redness of a localized  area usually over a bony prominence.  Wound Description (Comments): Reddend area non-blanchable  Present on Admission: Yes  Dressing Type Foam - Lift dressing to assess site every shift 09/14/23 0800     Pressure Injury 09/11/23 Buttocks Bilateral Stage 1 -  Intact skin with non-blanchable redness of a localized area usually over a bony prominence. reddend area non-blanchable (Active)  09/11/23 1815  Location: Buttocks  Location Orientation: Bilateral  Staging: Stage 1 -  Intact skin with non-blanchable redness of a localized area usually over a bony prominence.  Wound Description (Comments): reddend area non-blanchable  Present on Admission: Yes  Dressing Type Foam - Lift dressing to assess site every shift 09/14/23 0800    Consultants:  None  Procedures:  None  Antimicrobials:  Anti-infectives (From admission, onward)    None         Subjective: Patient seen and examined, he is fully alert and oriented today, albeit slightly slow in responding due to significant weakness.  Objective: Vitals:   09/13/23 2300 09/14/23 0300 09/14/23 0500 09/14/23 0900  BP: (!) 105/51 (!) 95/39  (!) 102/51  Pulse: 66 61    Resp: 17 19  20   Temp: 99.3 F (37.4 C) 98.6 F (37 C)    TempSrc: Oral Oral    SpO2: 100% 100%  100%  Weight:   70.9 kg     Intake/Output Summary (Last 24 hours) at 09/14/2023 1117 Last data filed at 09/14/2023 1032 Gross per 24 hour  Intake 789.95 ml  Output 1450 ml  Net -660.05 ml   Filed Weights   09/12/23 1202 09/14/23 0500  Weight: 69.2 kg 70.9 kg    Examination:  General exam: Appears calm and comfortable, very debilitated Respiratory system: Clear to auscultation. Respiratory effort normal. Cardiovascular system: S1 & S2 heard, RRR. No JVD, murmurs, rubs, gallops or clicks. No pedal edema. Gastrointestinal system: Abdomen is distended with positive fluid shift but nontender and soft.. No organomegaly or masses felt. Normal bowel sounds  heard. Central nervous system: Alert and oriented. No focal neurological deficits. Extremities: Symmetric 5 x 5 power. Skin: No rashes, lesions or ulcers.  Psychiatry: Judgement and insight appear normal. Mood & affect appropriate.   Data Reviewed: I have personally reviewed following labs and imaging studies  CBC: Recent Labs  Lab 09/10/23 2020 09/11/23 0136 09/11/23 0415 09/12/23 0316 09/14/23 0502  WBC 8.3 7.1 6.0 13.7* 4.5  NEUTROABS 6.6  --   --  11.2* 3.3  HGB 10.4* 9.9* 9.4* 10.6* 7.4*  HCT 32.0* 30.1* 29.0* 31.9* 22.1*  MCV 102.2* 101.3* 102.5* 99.4 100.5*  PLT 104* 96* 92* 132* 76*   Basic Metabolic Panel: Recent Labs  Lab 09/10/23 2020 09/11/23 0136 09/11/23 0415 09/12/23 0316 09/13/23 0513 09/13/23 1705 09/14/23 0502  NA 137  --  141 139 139  --  140  K 3.0*  --  4.1 3.6 2.8* 3.2* 3.7  CL 111  --  115* 107 105  --  110  CO2 20*  --  20* 23 25  --  24  GLUCOSE 122*  --  116* 95 115*  --  101*  BUN 17  --  16 19 28*  --  26*  CREATININE 0.93 0.95 0.91 1.08 1.32*  --  1.01  CALCIUM 7.8*  --  7.7* 7.7* 7.6*  --  7.6*  MG  --   --  2.0 1.9 1.9 1.9 1.9  PHOS  --   --  2.5  --  2.7 2.6 1.9*   GFR: Estimated Creatinine Clearance: 54.6 mL/min (by C-G formula based on SCr of 1.01 mg/dL). Liver Function Tests: Recent Labs  Lab 09/10/23 2020 09/11/23 0415 09/12/23 0316 09/13/23 0513  AST 18 17 21 17   ALT 15 12 13 11   ALKPHOS 85 70 72 61  BILITOT 1.6* 1.8* 2.8* 1.6*  PROT 5.3* 5.0* 4.9* 4.7*  ALBUMIN 2.2* 2.1* 2.1* 2.3*   No results for input(s): "LIPASE", "AMYLASE" in the last 168 hours. Recent Labs  Lab 09/11/23 1029 09/12/23 0526 09/13/23 0513  AMMONIA 42* 32 41*   Coagulation Profile: Recent Labs  Lab 09/11/23 0136 09/11/23 0415  INR 1.2 1.3*   Cardiac Enzymes: No results for input(s): "CKTOTAL", "CKMB", "CKMBINDEX", "TROPONINI" in the last 168 hours. BNP (last 3 results) No results for input(s): "PROBNP" in the last 8760  hours. HbA1C: No results for input(s): "HGBA1C" in the last 72 hours. CBG: No results for input(s): "GLUCAP" in the last 168 hours. Lipid Profile: No results for input(s): "CHOL", "HDL", "LDLCALC", "TRIG", "CHOLHDL", "LDLDIRECT" in the last 72 hours. Thyroid Function Tests: No results for  input(s): "TSH", "T4TOTAL", "FREET4", "T3FREE", "THYROIDAB" in the last 72 hours. Anemia Panel: Recent Labs    09/14/23 0839  VITAMINB12 747  FOLATE 5.3*  RETICCTPCT 3.2*    Sepsis Labs: Recent Labs  Lab 09/12/23 0316 09/13/23 1230  PROCALCITON 0.22  --   LATICACIDVEN  --  1.7    Recent Results (from the past 240 hours)  Resp panel by RT-PCR (RSV, Flu A&B, Covid) Anterior Nasal Swab     Status: None   Collection Time: 09/10/23  8:20 PM   Specimen: Anterior Nasal Swab  Result Value Ref Range Status   SARS Coronavirus 2 by RT PCR NEGATIVE NEGATIVE Final   Influenza A by PCR NEGATIVE NEGATIVE Final   Influenza B by PCR NEGATIVE NEGATIVE Final    Comment: (NOTE) The Xpert Xpress SARS-CoV-2/FLU/RSV plus assay is intended as an aid in the diagnosis of influenza from Nasopharyngeal swab specimens and should not be used as a sole basis for treatment. Nasal washings and aspirates are unacceptable for Xpert Xpress SARS-CoV-2/FLU/RSV testing.  Fact Sheet for Patients: BloggerCourse.com  Fact Sheet for Healthcare Providers: SeriousBroker.it  This test is not yet approved or cleared by the [place] FDA and has been authorized for detection and/or diagnosis of SARS-CoV-2 by FDA under an Emergency Use Authorization (EUA). This EUA will remain in effect (meaning this test can be used) for the duration of the COVID-19 declaration under Section 564(b)(1) of the Act, 21 U.S.C. section 360bbb-3(b)(1), unless the authorization is terminated or revoked.     Resp Syncytial Virus by PCR NEGATIVE NEGATIVE Final    Comment: (NOTE) Fact Sheet for  Patients: BloggerCourse.com  Fact Sheet for Healthcare Providers: SeriousBroker.it  This test is not yet approved or cleared by the [place] FDA and has been authorized for detection and/or diagnosis of SARS-CoV-2 by FDA under an Emergency Use Authorization (EUA). This EUA will remain in effect (meaning this test can be used) for the duration of the COVID-19 declaration under Section 564(b)(1) of the Act, 21 U.S.C. section 360bbb-3(b)(1), unless the authorization is terminated or revoked.  Performed at [facility], [address] [place], [place] [address]   Culture, body fluid w Gram Stain-bottle     Status: None (Preliminary result)   Collection Time: 09/11/23 11:09 AM   Specimen: Peritoneal Washings  Result Value Ref Range Status   Specimen Description PERITONEAL  Final   Special Requests NONE  Final   Culture   Final    NO GROWTH 3 DAYS Performed at [facility], [address], [place], [place] [address]    Report Status PENDING  Incomplete  Gram stain     Status: None   Collection Time: 09/11/23 11:09 AM   Specimen: Peritoneal Washings  Result Value Ref Range Status   Specimen Description PERITONEAL  Final   Special Requests NONE  Final   Gram Stain   Final    WBC PRESENT,BOTH PMN AND MONONUCLEAR NO ORGANISMS SEEN CYTOSPIN SMEAR Performed at [facility], [address], [place], [place] [address]    Report Status 09/11/2023 FINAL  Final     Radiology Studies: No results found.   Scheduled Meds:  aspirin  81 mg Oral Daily   Chlorhexidine Gluconate Cloth  6 each Topical Daily   enoxaparin (LOVENOX) injection  40 mg Subcutaneous Q24H   feeding supplement  237 mL Oral BID BM   Gerhardt's butt cream   Topical Daily   lactulose  30  g Oral BID   levothyroxine  50 mcg Oral QAC breakfast   midodrine  10 mg Oral TID WC   multivitamin with minerals  1 tablet Oral Daily   nadolol  20 mg Oral  Daily   phosphorus  250 mg Oral BID   potassium chloride SA  20 mEq Oral Daily   QUEtiapine  25 mg Oral Q24H   sodium chloride flush  10-40 mL Intracatheter Q12H   sodium chloride flush  3 mL Intravenous Q12H   thiamine  100 mg Oral BID   Continuous Infusions:     LOS: 3 days   Hughie Closs, MD [facility]  09/14/2023, 11:17 AM   *Please note that this is a verbal dictation therefore any spelling or grammatical errors are due to the "Dragon Medical One" system interpretation.  Please page via Amion and do not message via secure chat for urgent patient care matters. Secure chat can be used for non urgent patient care matters.  How to contact the [facility] Attending or Consulting provider 7A - 7P or covering provider during after hours 7P -7A, for this patient?  Check the care team in [facility] and look for a) attending/consulting TRH provider listed and b) the [facility] team listed. Page or secure chat 7A-7P. Log into [email or web address] and use [facility]'s universal password to access. If you do not have the password, please contact the hospital operator. Locate the [employer] provider you are looking for under [facility] and page to a number that you can be directly reached. If you still have difficulty reaching the provider, please page the [facility] (Director on Call) for the Hospitalists listed on amion for assistance.

## 2023-09-14 NOTE — Telephone Encounter (Signed)
CALLED PATIENT TO INFORM OF APPT. WITH DR. Vassie Loll ON 10-22-23- ARRIVAL TIME- 10:45 AM @ [facility], SPOKE WITH PATIENT'S SON- Jerome Kelley AND HE IS AWARE OF THIS APPT.

## 2023-09-14 NOTE — TOC Initial Note (Signed)
Transition of Care [facility]) - Initial/Assessment Note    Patient Details  Name: Jerome Kelley MRN: [id number] Date of Birth: [birth year]  Transition [facility]) CM/SW Contact:    Eduard Roux, LCSW Phone Number: 09/14/2023, 2:44 PM  Clinical Narrative:                  Per chart review patient is alert Ax2. CSW received message form CMA, informed , patient's son was interested in [facility]. CSW called the patient's son,Darrell, he wants [facility] after the patient d/c from hospital. CSW informed family recommendations has been made for short term rehab at [facility]. Darrell states he is agreeable to rehab at [facility] and preferred SNF is [facility]. CSW explained the SNF process. All questions answered.  TOC will provide bed offers once available  TOC will continue to follow and assist with discharge planning.  Antony Blackbird, MSW, LCSW Clinical Social Worker    Expected Discharge Plan: Skilled Nursing Facility Barriers to Discharge: Continued Medical Work up, SNF Pending bed offer   Patient Goals and CMS Choice            Expected Discharge Plan and Services In-house Referral: Clinical Social Work     Living arrangements for the past 2 months: Single Family Home                                      Prior Living Arrangements/Services Living arrangements for the past 2 months: Single Family Home Lives with:: Self, Spouse          Need for Family Participation in Patient Care: Yes (Comment) Care giver support system in place?: Yes (comment)      Activities of Daily Living      Permission Sought/Granted                  Emotional Assessment         Alcohol / Substance Use: Not Applicable Psych Involvement: No (comment)  Admission diagnosis:  Anasarca [R60.1] Generalized abdominal pain [R10.84] Patient Active Problem List   Diagnosis Date Noted   Protein-calorie malnutrition, severe 09/12/2023   Esophageal varices (HCC) 09/11/2023   Cirrhosis (HCC)  09/11/2023   Cachexia (HCC) 09/11/2023   Nicotine abuse 09/10/2023   Ascites 09/10/2023   Anasarca 09/10/2023   COPD, mild (HCC) 01/24/2021   Port-A-Cath in place 10/29/2020   Malignant neoplasm of upper lobe of right lung (HCC) 04/30/2020   Goals of care, counseling/discussion 03/15/2020   Hip fracture (HCC) 10/18/2018   Essential hypertension 10/18/2018   Closed fracture of neck of right femur (HCC)    Colon cancer (HCC) 09/18/2011   PCP:  Irven Coe, MD Pharmacy:   [id number] - [place], [place] - [address] [place] [address] Phone: [phone] Fax: [phone]     Social Drivers of Health (SDOH) Social History: SDOH Screenings   Food Insecurity: Patient Unable To Answer (09/11/2023)  Housing: Patient Unable To Answer (09/11/2023)  Transportation Needs: Patient Unable To Answer (09/11/2023)  Utilities: Patient Unable To Answer (09/11/2023)  Depression (PHQ2-9): Low Risk  (08/13/2023)  Social Connections: Patient Unable To Answer (09/11/2023)  Tobacco Use: High Risk (09/10/2023)   SDOH Interventions:     Readmission Risk Interventions     No data to display

## 2023-09-14 NOTE — Care Management Important Message (Signed)
Important Message  Patient Details  Name: Jerome Kelley MRN: [id number] Date of Birth: 03-20-1941   Important Message Given:  Yes - Medicare IM     Dorena Bodo 09/14/2023, 2:33 PM

## 2023-09-14 NOTE — Telephone Encounter (Signed)
XXXXX

## 2023-09-15 DIAGNOSIS — Z515 Encounter for palliative care: Secondary | ICD-10-CM

## 2023-09-15 DIAGNOSIS — R601 Generalized edema: Secondary | ICD-10-CM | POA: Diagnosis not present

## 2023-09-15 DIAGNOSIS — Z7189 Other specified counseling: Secondary | ICD-10-CM

## 2023-09-15 LAB — BASIC METABOLIC PANEL
Anion gap: 10 (ref 5–15)
BUN: 24 mg/dL — ABNORMAL HIGH (ref 8–23)
CO2: 23 mmol/L (ref 22–32)
Calcium: 7.7 mg/dL — ABNORMAL LOW (ref 8.9–10.3)
Chloride: 108 mmol/L (ref 98–111)
Creatinine, Ser: 0.81 mg/dL (ref 0.61–1.24)
GFR, Estimated: >60 mL/min (ref >60)
Glucose, Bld: 104 mg/dL — ABNORMAL HIGH (ref 70–99)
Potassium: 3.7 mmol/L (ref 3.5–5.1)
Sodium: 141 mmol/L (ref 135–145)

## 2023-09-15 LAB — PHOSPHORUS
Phosphorus: 2.3 mg/dL — ABNORMAL LOW (ref 2.5–4.6)
Phosphorus: 2.6 mg/dL (ref 2.5–4.6)

## 2023-09-15 LAB — POTASSIUM: Potassium: 3.8 mmol/L (ref 3.5–5.1)

## 2023-09-15 LAB — MAGNESIUM
Magnesium: 2 mg/dL (ref 1.7–2.4)
Magnesium: 2 mg/dL (ref 1.7–2.4)

## 2023-09-15 MED ORDER — FUROSEMIDE 40 MG PO TABS
40.0000 mg | ORAL_TABLET | Freq: Every day | ORAL | Status: DC
  Administered 2023-09-15 – 2023-09-16 (×2): 40 mg via ORAL
  Filled 2023-09-15 (×2): qty 1

## 2023-09-15 MED ORDER — SPIRONOLACTONE 25 MG PO TABS
25.0000 mg | ORAL_TABLET | Freq: Every day | ORAL | Status: DC
  Administered 2023-09-15 – 2023-09-16 (×2): 25 mg via ORAL
  Filled 2023-09-15 (×2): qty 1

## 2023-09-15 NOTE — Consult Note (Addendum)
Palliative Care Consult Note                                  Date: 09/15/2023   Patient Name: Jerome Kelley  DOB: [birth year]  MRN: [id number]  Age / Sex: 83 y.o., male  PCP: Jerome Coe, MD Referring Physician: Hughie Closs, MD  Reason for Consultation: Establishing goals of care  HPI/Patient Profile: 83 y.o. male  with past medical history of lung cancer, chronically uses 2 L/min of supplementary oxygen, colon cancer, cirrhosis, cachexia admitted on 09/10/2023 with bilateral leg and abdominal edema and anasarca.   1/28 paracentesis 5 liters  Past Medical History:  Diagnosis Date   Colon cancer (HCC) 09/18/2011   Goals of care, counseling/discussion 03/15/2020   History of chemotherapy    History of radiation therapy 11/04/2020-12/15/2020    mediastinal and right hilar region    Dr Jerome Kelley   Hypertension    Lung cancer, upper lobe (HCC) 09/18/2011   Malignant neoplasm of upper lobe of right lung (HCC) 04/30/2020    Subjective:   I have reviewed medical records including EPIC notes, labs and imaging, assessed the patient and then met at patient's bedside with the patient and his son Jerome Kelley to discuss diagnosis prognosis, GOC, EOL wishes, disposition and options. I spoke to patient's wife Jerome Kelley and she is unable to attend the meeting but would like their son Jerome Kelley present.  I introduced Palliative Medicine as specialized medical care for people living with serious illness. It focuses on providing relief from symptoms and stress of a serious illness. The goal is to improve quality of life for both the patient and the family.  Patient faces treatment option decisions, advanced directive decisions, and anticipatory care needs.  Today's Discussion: The patient is lying in bed. He appears weak and chronically ill. The patient and his son have a good understanding of his cancer diagnosis which he has been receiving therapies. The patient  is just learning of his cirrhosis diagnosis. I reviewed my understanding of his diagnosis, including limitations of ongoing interventions and risk for further decline despite aggressive treatment efforts. We discuss his cachexia, esophageal varices, and now resolved encephalopathy. I shared my concern that the patient would have continuing and recurrent symptoms.  Patient lives in [place] with his wife Jerome Kelley who he has been married to for over 50 years. She has chronic illness and is on oxygen at home. 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 He uses a cane for balance. The patient's appetite has been poor and oncology had put him on megace to help.   A discussion was had today regarding advanced directives. If patient were unable to make decisions himself he would want his wife and son Jerome Kelley to make decisions. Confirmed patient's DNR status. The patient shares that once it is his time to go it is his time to go. Patient shared he would never want to be intubated-- confirmed DNI status.  The difference between a aggressive medical intervention path and  a palliative comfort care path for this patient at this time was had. We discussed hospice philosophy and options. The patient shares that he would like to die at home when it is time. He also shares that if he cannot be independent and participate in life then it will be his time to die. They are considering home hospice at discharge or at a minimum discharging with outpatient palliative. The son feels like it may be time for the patient to go home so he can spend time with [place]. Jerome Kelley was unable to be at the meeting today. I offered to call her to discuss the options but the patient's son wants to discuss with her first. Encouraged the son to call with questions, concerns, or if he wants me to reach out to  [place].  Discussed the importance of continued conversation with family and the medical providers regarding overall plan of care and treatment options, ensuring decisions are within the context of the patient's values and GOCs.  Questions and concerns were addressed. Hard Choices booklet left for review. The family was encouraged to call with questions or concerns. PMT will continue to support holistically.   Addendum 1430: Son Jerome Kelley called to share they have decided to proceed with home hospice. They would like to continue the current plan of care until discharge to optimize the patient. They will transition to comfort measures once they are home with hospice. Patient's son is working on getting an additional caregiver to help. All questions answered.   Review of Systems  Constitutional:  Positive for fatigue.  Cardiovascular:  Positive for leg swelling.  Gastrointestinal:  Positive for abdominal distention.    Objective:   Primary Diagnoses: Present on Admission:  Colon cancer [facility])   Physical Exam Constitutional:      Appearance: He is ill-appearing.  Cardiovascular:     Rate and Rhythm: Normal rate.  Pulmonary:     Effort: Pulmonary effort is normal.  Skin:    General: Skin is warm and dry.     Comments: Bruising on arms   Neurological:     Mental Status: He is alert and oriented to person, place, and time.  Psychiatric:        Mood and Affect: Mood normal.        Behavior: Behavior normal.     Vital Signs:  BP (!) 100/49 (BP Location: Left Arm)   Pulse 66   Temp (!) 97.5 F (36.4 C) (Axillary)   Resp (!) 23   Wt 67.9 kg   SpO2 100%   BMI 22.76 kg/m    Advanced Care Planning:   Existing Vynca/ACP Documentation: None  Primary Decision Maker: PATIENT  Code Status/Advance Care Planning: DNR   Assessment & Plan:   SUMMARY OF RECOMMENDATIONS   DNR Limited scope of treatment: DNI Continue current plan of care Discharge home with home  hospice Continued PMT support    Discussed with: bedside RN and Dr. Jacqulyn Kelley  Time Total: 105 minutes  Thank you for allowing [place] to participate in the care of Jerome Kelley PMT will continue to support holistically.   Signed by: Sarina Ser, NP Palliative Medicine Team  Team Phone # [phone] (Nights/Weekends)  09/15/2023, 10:13 AM

## 2023-09-15 NOTE — Progress Notes (Signed)
PROGRESS NOTE    AVI KERSCHNER  AVW:[id number] DOB: 1940/11/09 DOA: 09/10/2023 PCP: Irven Coe, MD   Brief Narrative:  HPI: Jerome Kelley is a 83 y.o. male with medical history significant of lung cancer.  He also has metastatic adenocarcinoma of the lung.   Patient chronically uses 2 L/min of supplementary oxygen.  Patient was in his usual state of health till about 4 weeks ago.  Since that time patient is describing progressive lower extremity swelling initially starting around ankles progressing to the thighs.  For the last for 5 days patient has had progressive abdominal distention as well and for last 2 days he has been having exertional shortness of breath.  Shortness of breath has gotten so bad that the patient is requiring 6 L/min of oxygen at home to be comfortable and is still short of breath now.  There is no history of any fever cough expectoration chest pain palpitation or similar complaints in the past. Patient has mild genrealised abd disocmfort. No aggravating/reliving factor. No dysuria.   ER workup is notable for patient requiring 6 L/min of supplementary oxygen, marked abdominal distention noted.  Medical evaluation is sought.  Assessment & Plan:   Principal Problem:   Anasarca Active Problems:   Colon cancer (HCC)   Nicotine abuse   Ascites   Esophageal varices (HCC)   Cirrhosis (HCC)   Cachexia (HCC)   Protein-calorie malnutrition, severe  Anasarca/cachexia Patient not chronically on diuretics. is noted to have systemic fluid overload manifesting as crackles heard on lung and abdominal distention as well as edema. LFTs noted for slight elevation of bilirubin, albumin is low.  INR is elevated.  CAT scan is already showing cirrhosis changes of the liver. Troponin is within normal limits. Patient was given 1 dose of Lasix 40 mg followed by 5 mg/h infusion after that.  Monitor intake output and daily weights.  He has had decent diuresis with net -5.2 L since admission +5 L  of paracentesis fluid.  Due to rising creatinine, Lasix drip was discontinued 09/13/2023.  Acute encephalopathy/?  Acute hepatic encephalopathy/delirium: Ammonia slightly elevated 09/13/2023 yesterday despite of taking lactulose.  Patient's mental status has improved and he remains alert and oriented since 09/14/2023.  No need to check repeat ammonia as long as he is alert and oriented but continue lactulose.  Continue nightly Seroquel. 1. Avoid benzodiazepines, antihistamines, anticholinergics, and minimize opiate use as these may worsen delirium. 2: Assess, prevent and manage pain as lack of treatment can result in delirium.  3: Provide appropriate lighting and clear signage; a clock and calendar should be easily visible to the patient. 4: Monitor environmental factors. Reduce light and noise at night (close shades, turn off lights, turn off TV, ect). Correct any alterations in sleep cycle. 5: Reorient the patient to person, place, time and situation on each encounter.  6: Correct sensory deficits if possible (replace eye glasses, hearing aids, ect). 7: Avoid restraints if able. Severely delirious patients benefit from constant observation by a sitter.  Hypokalemia: Replenished and resolved.   Acute on chronic hypoxic respiratory failure: Chest x-ray shows possibility of infiltrates.  Atelectasis may also be the cause due to abdominal distention.  Patient is afebrile so doubt pneumonia.  Echo showed grade 1 diastolic dysfunction, systolic function is normal.  Respiratory status has improved post paracentesis, currently on room air.  Encourage incentive spirometry.   Liver cirrhosis (HCC)/ascites/hypotension New Diagnosis based on CT scan demonstration, finding of ascites. Thrombocytoepnia.  Ferritin normal, viral  hepatitis panel ruled out.  IR is consulted patient underwent diagnostic and therapeutic paracentesis 09/11/2023 with 5 L out.  Gave him 3 doses of albumin 09/12/2023, blood pressure remains  low and creatinine was rising so he received another 2 units of albumin on 09/13/2023 making a total of 5 doses.  Due to crackles noted in the afternoon of 09/13/2023, 6th dose of albumin was discontinued.  He denies any shortness of breath but still has crackles.  On exam 09/14/2023, he did appear to have slightly more fluid shift and ascites so IR was consulted for paracentesis however per them, he did not have enough fluid and patient also wanted to wait.  Midodrine started 09/13/2023, increased dosage on 09/14/2023, blood pressure is improving.  Lactic acid is normal.  I believe we have some room to put him on scheduled daily oral Lasix and Aldactone so I will do that today and monitor blood pressure closely.   AKI: Creatinine jumped from 1.0-1.32 yesterday and back to normal today.  Avoid nephrotoxic agents.  Chronic alcoholism: Patient told me that he drinks 3 beers a day for last more than 25 years.  The send confirmed that the patient has been drinking since he was born and he is 83 years old.  However he has cut down since 2 months since he was diagnosed with colon cancer.   Esophageal varices (HCC) Seen incidentally on CT abd pelvis. Started nadolol 20 mg daily . Uptitrate as able.   Nicotine abuse Current. Advised to quit especially due to oxygen use. Nicotine replacement being ordered   Colon cancer [facility]) Based on onc note 08/03/2023:   Principle Diagnosis:  Stage IIIB ([id number]) adenocarcinoma of the right middle lung History of stage Ia ([id number]) adenocarcinoma the right lung - resected in March 2011 History of stage II adenocarcinoma of the colon-high risk - April 2010 Metastatic adenocarcinoma of the lung --retroperitoneal nodal metastasis and new left lung nodule  --high PD-L1   Past Therapy:        Carboplatin/Alimta/Pembrolizumab - started 05/10/2020- s/p cycle 6 Radiation with weekly carboplatin/Taxol-start on 11/01/2020 -- completed on 12/20/2020 Yervoy/Opdivo --s/p cycle #4  --  start on 09/15/2021   Current Therapy:  Nivolumab -- maintenance -- s/p cycle #10 -- start on 01/17/2022 -- d/c on 08/03/2023   Patient has been getting radiation therapy since then - I am not sure to which part of the body however.   Anxiety -patient reports taking 0.5 mg every 8 hours as needed Ativan for anxiety.  Admitting hospitalist has reduced the dosage to 0.25 mg as given cirrhosis, however with him having delirium, benzodiazepines were discontinued 09/12/2023.  Anemia of chronic disease: Hemoglobin at baseline between 9-10.  Hemoglobin this morning dropped to 7.4.  I noticed that white blood cells and platelets also appear to have dropped.  Raises concern for possible error in the lab.  Repeating CBC.  Checking iron studies, B12, folate and FOBT.  He is at high risk of GI bleeding due to esophageal varices.  Thrombocytopenia: This is also chronic.  No signs of bleeding.  Stable.  Monitor.  Severe protein calorie malnutrition: Seen by dietitian.  Plan is to watch his p.o. intake for next 1 to 2 days, if p.o. intake remains poor, they suggested tube feedings through [employer].  GOC: I believe due to multiple medical problems and new diagnosis of cirrhosis, patient appears to have poor short-term and long-term prognosis.  I have consulted palliative care to help lead GOC conversations with  family.  Hypophosphatemia: Open, continue supplement.  CODE STATUS: On 09/14/2023, patient was fully alert and oriented, discussed with him about CODE STATUS, he was very clear that he does not want to be full code and wanted to be DNR.  Changed to DNR per patient's wishes.  DVT prophylaxis: enoxaparin (LOVENOX) injection 40 mg Start: 09/12/23 0800 SCDs Start: 09/11/23 0008   Code Status: Limited: Do not attempt resuscitation (DNR) -DNR-LIMITED -Do Not Intubate/DNI   Family Communication:  None present at bedside.  Discussed in length with the son over the phone 09/12/2023  Status is: Inpatient Remains  inpatient appropriate because: Medically stable, needs SNF.   Estimated body mass index is 22.76 kg/m as calculated from the following:   Height as of 08/13/23: 5\' 8"  (1.727 m).   Weight as of this encounter: 67.9 kg.  Pressure Injury 09/11/23 Heel Bilateral Stage 1 -  Intact skin with non-blanchable redness of a localized area usually over a bony prominence. Reddend area non-blanchable (Active)  09/11/23 1815  Location: Heel  Location Orientation: Bilateral  Staging: Stage 1 -  Intact skin with non-blanchable redness of a localized area usually over a bony prominence.  Wound Description (Comments): Reddend area non-blanchable  Present on Admission: Yes  Dressing Type Foam - Lift dressing to assess site every shift 09/14/23 1957     Pressure Injury 09/11/23 Buttocks Bilateral Stage 1 -  Intact skin with non-blanchable redness of a localized area usually over a bony prominence. reddend area non-blanchable (Active)  09/11/23 1815  Location: Buttocks  Location Orientation: Bilateral  Staging: Stage 1 -  Intact skin with non-blanchable redness of a localized area usually over a bony prominence.  Wound Description (Comments): reddend area non-blanchable  Present on Admission: Yes  Dressing Type Foam - Lift dressing to assess site every shift 09/14/23 1957   Nutritional Assessment: Body mass index is 22.76 kg/m.Marland Kitchen Seen by dietician.  I agree with the assessment and plan as outlined below: Nutrition Status: Nutrition Problem: Severe Malnutrition Etiology: chronic illness Signs/Symptoms: severe muscle depletion, severe fat depletion, energy intake < 75% for > 7 days Interventions: Refer to RD note for recommendations  . Skin Assessment: I have examined the patient's skin and I agree with the wound assessment as performed by the wound care RN as outlined below: Pressure Injury 09/11/23 Heel Bilateral Stage 1 -  Intact skin with non-blanchable redness of a localized area usually over a  bony prominence. Reddend area non-blanchable (Active)  09/11/23 1815  Location: Heel  Location Orientation: Bilateral  Staging: Stage 1 -  Intact skin with non-blanchable redness of a localized area usually over a bony prominence.  Wound Description (Comments): Reddend area non-blanchable  Present on Admission: Yes  Dressing Type Foam - Lift dressing to assess site every shift 09/14/23 1957     Pressure Injury 09/11/23 Buttocks Bilateral Stage 1 -  Intact skin with non-blanchable redness of a localized area usually over a bony prominence. reddend area non-blanchable (Active)  09/11/23 1815  Location: Buttocks  Location Orientation: Bilateral  Staging: Stage 1 -  Intact skin with non-blanchable redness of a localized area usually over a bony prominence.  Wound Description (Comments): reddend area non-blanchable  Present on Admission: Yes  Dressing Type Foam - Lift dressing to assess site every shift 09/14/23 1957    Consultants:  None  Procedures:  None  Antimicrobials:  Anti-infectives (From admission, onward)    None         Subjective: Patient seen and  examined.  Significantly weak.  Denies any complaint.  Fully alert and oriented.  Objective: Vitals:   09/15/23 0500 09/15/23 0822 09/15/23 0900 09/15/23 1255  BP:  (!) 100/49  (!) 113/53  Pulse:  66 71   Resp:  (!) 23 (!) 21   Temp:  (!) 97.5 F (36.4 C)  97.6 F (36.4 C)  TempSrc:  Axillary  Oral  SpO2:  100% 100%   Weight: 67.9 kg       Intake/Output Summary (Last 24 hours) at 09/15/2023 1334 Last data filed at 09/15/2023 1100 Gross per 24 hour  Intake 480 ml  Output 400 ml  Net 80 ml   Filed Weights   09/12/23 1202 09/14/23 0500 09/15/23 0500  Weight: 69.2 kg 70.9 kg 67.9 kg    Examination:  General exam: Appears calm and comfortable, very debilitated Respiratory system: Clear to auscultation. Respiratory effort normal. Cardiovascular system: S1 & S2 heard, RRR. No JVD, murmurs, rubs, gallops or  clicks. No pedal edema. Gastrointestinal system: Abdomen is distended with positive fluid shift but nontender and soft.. No organomegaly or masses felt. Normal bowel sounds heard. Central nervous system: Alert and oriented. No focal neurological deficits. Extremities: Symmetric 5 x 5 power. Skin: No rashes, lesions or ulcers.  Psychiatry: Judgement and insight appear normal. Mood & affect appropriate.   Data Reviewed: I have personally reviewed following labs and imaging studies  CBC: Recent Labs  Lab 09/10/23 2020 09/11/23 0136 09/11/23 0415 09/12/23 0316 09/14/23 0502 09/14/23 0839  WBC 8.3 7.1 6.0 13.7* 4.5 5.0  NEUTROABS 6.6  --   --  11.2* 3.3 3.9  HGB 10.4* 9.9* 9.4* 10.6* 7.4* 8.0*  HCT 32.0* 30.1* 29.0* 31.9* 22.1* 23.9*  MCV 102.2* 101.3* 102.5* 99.4 100.5* 100.8*  PLT 104* 96* 92* 132* 76* 82*   Basic Metabolic Panel: Recent Labs  Lab 09/11/23 0415 09/12/23 0316 09/13/23 0513 09/13/23 1705 09/14/23 0502 09/14/23 1700 09/15/23 0535  NA 141 139 139  --  140  --  141  K 4.1 3.6 2.8* 3.2* 3.7 3.7 3.7  CL 115* 107 105  --  110  --  108  CO2 20* 23 25  --  24  --  23  GLUCOSE 116* 95 115*  --  101*  --  104*  BUN 16 19 28*  --  26*  --  24*  CREATININE 0.91 1.08 1.32*  --  1.01  --  0.81  CALCIUM 7.7* 7.7* 7.6*  --  7.6*  --  7.7*  MG 2.0 1.9 1.9 1.9 1.9 2.1 2.0  PHOS 2.5  --  2.7 2.6 1.9* 2.2* 2.3*   GFR: Estimated Creatinine Clearance: 67.5 mL/min (by C-G formula based on SCr of 0.81 mg/dL). Liver Function Tests: Recent Labs  Lab 09/10/23 2020 09/11/23 0415 09/12/23 0316 09/13/23 0513  AST 18 17 21 17   ALT 15 12 13 11   ALKPHOS 85 70 72 61  BILITOT 1.6* 1.8* 2.8* 1.6*  PROT 5.3* 5.0* 4.9* 4.7*  ALBUMIN 2.2* 2.1* 2.1* 2.3*   No results for input(s): "LIPASE", "AMYLASE" in the last 168 hours. Recent Labs  Lab 09/11/23 1029 09/12/23 0526 09/13/23 0513  AMMONIA 42* 32 41*   Coagulation Profile: Recent Labs  Lab 09/11/23 0136 09/11/23 0415   INR 1.2 1.3*   Cardiac Enzymes: No results for input(s): "CKTOTAL", "CKMB", "CKMBINDEX", "TROPONINI" in the last 168 hours. BNP (last 3 results) No results for input(s): "PROBNP" in the last 8760 hours. HbA1C: No results  for input(s): "HGBA1C" in the last 72 hours. CBG: No results for input(s): "GLUCAP" in the last 168 hours. Lipid Profile: No results for input(s): "CHOL", "HDL", "LDLCALC", "TRIG", "CHOLHDL", "LDLDIRECT" in the last 72 hours. Thyroid Function Tests: No results for input(s): "TSH", "T4TOTAL", "FREET4", "T3FREE", "THYROIDAB" in the last 72 hours. Anemia Panel: Recent Labs    09/14/23 0839  VITAMINB12 747  FOLATE 5.3*  TIBC NOT CALCULATED  IRON 18*  RETICCTPCT 3.2*    Sepsis Labs: Recent Labs  Lab 09/12/23 0316 09/13/23 1230  PROCALCITON 0.22  --   LATICACIDVEN  --  1.7    Recent Results (from the past 240 hours)  Resp panel by RT-PCR (RSV, Flu A&B, Covid) Anterior Nasal Swab     Status: None   Collection Time: 09/10/23  8:20 PM   Specimen: Anterior Nasal Swab  Result Value Ref Range Status   SARS Coronavirus 2 by RT PCR NEGATIVE NEGATIVE Final   Influenza A by PCR NEGATIVE NEGATIVE Final   Influenza B by PCR NEGATIVE NEGATIVE Final    Comment: (NOTE) The Xpert Xpress SARS-CoV-2/FLU/RSV plus assay is intended as an aid in the diagnosis of influenza from Nasopharyngeal swab specimens and should not be used as a sole basis for treatment. Nasal washings and aspirates are unacceptable for Xpert Xpress SARS-CoV-2/FLU/RSV testing.  Fact Sheet for Patients: BloggerCourse.com  Fact Sheet for Healthcare Providers: SeriousBroker.it  This test is not yet approved or cleared by the [place] FDA and has been authorized for detection and/or diagnosis of SARS-CoV-2 by FDA under an Emergency Use Authorization (EUA). This EUA will remain in effect (meaning this test can be used) for the duration of  the COVID-19 declaration under Section 564(b)(1) of the Act, 21 U.S.C. section 360bbb-3(b)(1), unless the authorization is terminated or revoked.     Resp Syncytial Virus by PCR NEGATIVE NEGATIVE Final    Comment: (NOTE) Fact Sheet for Patients: BloggerCourse.com  Fact Sheet for Healthcare Providers: SeriousBroker.it  This test is not yet approved or cleared by the [place] FDA and has been authorized for detection and/or diagnosis of SARS-CoV-2 by FDA under an Emergency Use Authorization (EUA). This EUA will remain in effect (meaning this test can be used) for the duration of the COVID-19 declaration under Section 564(b)(1) of the Act, 21 U.S.C. section 360bbb-3(b)(1), unless the authorization is terminated or revoked.  Performed at [facility], [address], [place], [place] [address]   Culture, body fluid w Gram Stain-bottle     Status: None (Preliminary result)   Collection Time: 09/11/23 11:09 AM   Specimen: Peritoneal Washings  Result Value Ref Range Status   Specimen Description PERITONEAL  Final   Special Requests NONE  Final   Culture   Final    NO GROWTH 4 DAYS Performed at [facility], [address], [place], [place] [address]    Report Status PENDING  Incomplete  Gram stain     Status: None   Collection Time: 09/11/23 11:09 AM   Specimen: Peritoneal Washings  Result Value Ref Range Status   Specimen Description PERITONEAL  Final   Special Requests NONE  Final   Gram Stain   Final    WBC PRESENT,BOTH PMN AND MONONUCLEAR NO ORGANISMS SEEN CYTOSPIN SMEAR Performed at [facility], [address] [place], [place] [address]    Report Status 09/11/2023 FINAL  Final     Radiology Studies: IR ABDOMEN US LIMITED Result Date: 09/14/2023 INDICATION: Metastatic  lung cancer with ascites IR asked to perform therapeutic paracentesis EXAM: ULTRASOUND ABDOMEN LIMITED FINDINGS: Imaging of all  4 quadrants of the abdomen reveal small pocket of ascites IMPRESSION: Small amount of ascites seen on ultrasound. After discussion of the risks versus benefits of the procedure the patient decided to defer paracentesis at this time. Performed By Theresa Mulligan, PA-C Electronically Signed   By: Gilmer Mor D.O.   On: 09/14/2023 15:42     Scheduled Meds:  aspirin  81 mg Oral Daily   Chlorhexidine Gluconate Cloth  6 each Topical Daily   enoxaparin (LOVENOX) injection  40 mg Subcutaneous Q24H   feeding supplement  237 mL Oral BID BM   folic acid  1 mg Oral Daily   Gerhardt's butt cream   Topical Daily   lactulose  30 g Oral BID   levothyroxine  50 mcg Oral QAC breakfast   midodrine  10 mg Oral TID WC   multivitamin with minerals  1 tablet Oral Daily   nadolol  20 mg Oral Daily   phosphorus  250 mg Oral BID   potassium chloride SA  20 mEq Oral Daily   QUEtiapine  25 mg Oral Q24H   sodium chloride flush  10-40 mL Intracatheter Q12H   sodium chloride flush  3 mL Intravenous Q12H   thiamine  100 mg Oral BID   Continuous Infusions:     LOS: 4 days   Hughie Closs, MD [facility]  09/15/2023, 1:34 PM   *Please note that this is a verbal dictation therefore any spelling or grammatical errors are due to the "Dragon Medical One" system interpretation.  Please page via Amion and do not message via secure chat for urgent patient care matters. Secure chat can be used for non urgent patient care matters.  How to contact the [facility] Attending or Consulting provider 7A - 7P or covering provider during after hours 7P -7A, for this patient?  Check the care team in [facility] and look for a) attending/consulting TRH provider listed and b) the [facility] team listed. Page or secure chat 7A-7P. Log into [email or web address] and use [place]'s universal password to access. If you do not have the password, please contact the hospital operator. Locate the [facility] provider you are looking for under [facility] and  page to a number that you can be directly reached. If you still have difficulty reaching the provider, please page the [facility] (Director on Call) for the Hospitalists listed on amion for assistance.

## 2023-09-15 NOTE — Plan of Care (Signed)
  Problem: Education: Goal: Required Educational Video(s) Outcome: Progressing   Problem: Clinical Measurements: Goal: Ability to maintain clinical measurements within normal limits will improve Outcome: Progressing Goal: Postoperative complications will be avoided or minimized Outcome: Progressing   Problem: Skin Integrity: Goal: Demonstration of wound healing without infection will improve Outcome: Progressing   Problem: Education: Goal: Knowledge of General Education information will improve Description: Including pain rating scale, medication(s)/side effects and non-pharmacologic comfort measures Outcome: Progressing   Problem: Health Behavior/Discharge Planning: Goal: Ability to manage health-related needs will improve Outcome: Progressing   Problem: Clinical Measurements: Goal: Ability to maintain clinical measurements within normal limits will improve Outcome: Progressing Goal: Will remain free from infection Outcome: Progressing Goal: Diagnostic test results will improve Outcome: Progressing Goal: Respiratory complications will improve Outcome: Progressing Goal: Cardiovascular complication will be avoided Outcome: Progressing   Problem: Activity: Goal: Risk for activity intolerance will decrease Outcome: Progressing   Problem: Nutrition: Goal: Adequate nutrition will be maintained Outcome: Progressing   Problem: Coping: Goal: Level of anxiety will decrease Outcome: Progressing   Problem: Elimination: Goal: Will not experience complications related to bowel motility Outcome: Progressing Goal: Will not experience complications related to urinary retention Outcome: Progressing   Problem: Pain Managment: Goal: General experience of comfort will improve and/or be controlled Outcome: Progressing   Problem: Safety: Goal: Ability to remain free from injury will improve Outcome: Progressing   Problem: Skin Integrity: Goal: Risk for impaired skin integrity  will decrease Outcome: Progressing

## 2023-09-16 DIAGNOSIS — R601 Generalized edema: Secondary | ICD-10-CM | POA: Diagnosis not present

## 2023-09-16 DIAGNOSIS — Z7189 Other specified counseling: Secondary | ICD-10-CM | POA: Diagnosis not present

## 2023-09-16 DIAGNOSIS — Z515 Encounter for palliative care: Secondary | ICD-10-CM | POA: Diagnosis not present

## 2023-09-16 LAB — PHOSPHORUS: Phosphorus: 2.7 mg/dL (ref 2.5–4.6)

## 2023-09-16 LAB — POTASSIUM: Potassium: 3.9 mmol/L (ref 3.5–5.1)

## 2023-09-16 LAB — CULTURE, BODY FLUID W GRAM STAIN -BOTTLE: Culture: NO GROWTH

## 2023-09-16 LAB — MAGNESIUM: Magnesium: 2 mg/dL (ref 1.7–2.4)

## 2023-09-16 MED ORDER — MIDODRINE HCL 10 MG PO TABS: 10.0000 mg | ORAL_TABLET | Freq: Three times a day (TID) | ORAL | 0 refills | Status: AC

## 2023-09-16 MED ORDER — NADOLOL 20 MG PO TABS: 20.0000 mg | ORAL_TABLET | Freq: Two times a day (BID) | ORAL | 0 refills | Status: AC

## 2023-09-16 MED ORDER — SPIRONOLACTONE 25 MG PO TABS: 25.0000 mg | ORAL_TABLET | Freq: Every day | ORAL | 0 refills | Status: AC

## 2023-09-16 MED ORDER — FOLIC ACID 1 MG PO TABS: 1.0000 mg | ORAL_TABLET | Freq: Every day | ORAL | 0 refills | Status: AC

## 2023-09-16 MED ORDER — LACTULOSE 10 GM/15ML PO SOLN: 30.0000 g | Freq: Two times a day (BID) | ORAL | 0 refills | Status: AC

## 2023-09-16 MED ORDER — FUROSEMIDE 40 MG PO TABS: 40.0000 mg | ORAL_TABLET | Freq: Every day | ORAL | 0 refills | Status: AC

## 2023-09-16 NOTE — TOC Progression Note (Addendum)
Transition of Care [facility]) - Progression Note    Patient Details  Name: Jerome Kelley MRN: [id number] Date of Birth: 10/13/1940  Transition of Care [facility]) CM/SW Contact  Ronny Bacon, RN Phone Number: 09/16/2023, 9:00 AM  Clinical Narrative:   Secure message from provider that family wants to do home with hospice. Spoke with [name] (Son), he does not know of a Hospice Facility to use and would like information on the ones in the area. Printed list of Hospice agencies, in the area of patients listed address, sent to unit to leave at patient's bedside for family to review when they arrive.  1205: Son at bedside, wants to use [facility]. Referral sent to hospital liaison for follow up.  1241: Spoke with Alba Cory RN with [facility]. They are able to accept patient under their care starting tomorrow. Provider made aware.  1350: Son aware that hospice start of care will begin tomorrow and that patient is being discharged today. Son reports that they have the following DME at home; [facility], cane and a bed where the head and the feet go up and down. Son thinks they have a wheelchair at home. PTAR forms completed and sent to th unit. Call to [facility] to arrange transportation.    Expected Discharge Plan: Skilled Nursing Facility Barriers to Discharge: Continued Medical Work up, SNF Pending bed offer  Expected Discharge Plan and Services In-house Referral: Clinical Social Work     Living arrangements for the past 2 months: Single Family Home                                       Social Determinants of Health (SDOH) Interventions SDOH Screenings   Food Insecurity: Patient Unable To Answer (09/11/2023)  Housing: Patient Unable To Answer (09/11/2023)  Transportation Needs: Patient Unable To Answer (09/11/2023)  Utilities: Patient Unable To Answer (09/11/2023)  Depression (PHQ2-9): Low Risk  (08/13/2023)  Social Connections: Patient Unable To  Answer (09/11/2023)  Tobacco Use: High Risk (09/10/2023)    Readmission Risk Interventions     No data to display

## 2023-09-16 NOTE — Discharge Summary (Signed)
Physician Discharge Summary  Jerome Kelley:[id number] DOB: [birth year] DOA: 09/10/2023  PCP: Irven Coe, MD  Admit date: 09/10/2023 Discharge date: 09/16/2023 30 Day Unplanned Readmission Risk Score    Flowsheet Row ED to Hosp-Admission (Current) from 09/10/2023 in [facility] [facility]  30 Day Unplanned Readmission Risk Score (%) 22.31 Filed at 09/16/2023 1200       This score is the patient's risk of an unplanned readmission within 30 days of being discharged (0 -100%). The score is based on dignosis, age, lab data, medications, orders, and past utilization.   Low:  0-14.9   Medium: 15-21.9   High: 22-29.9   Extreme: 30 and above          Admitted From: Home Disposition: Home with hospice  Recommendations for Outpatient Follow-up:  Follow up with PCP in 1-2 weeks Please obtain BMP/CBC in one week Please follow up with your PCP on the following pending results: Unresulted Labs (From admission, onward)     Start     Ordered   09/18/23 0500  Creatinine, serum  (enoxaparin (LOVENOX)    CrCl >/= 30 ml/min)  Weekly,   R     Comments: while on enoxaparin therapy    09/11/23 0008   09/14/23 0838  CBC with Differential/Platelet  Once,   R       Question:  Specimen collection method  Answer:  Unit=Unit collect   09/14/23 0837   09/13/23 0500  Potassium  5A & 5P,   R (with TIMED occurrences)     Question:  Specimen collection method  Answer:  IV Team=IV Team collect   09/12/23 1258   09/13/23 0500  Phosphorus  5A & 5P,   R (with TIMED occurrences)     Question:  Specimen collection method  Answer:  IV Team=IV Team collect   09/12/23 1258   09/13/23 0500  Magnesium  5A & 5P,   R (with TIMED occurrences)     Question:  Specimen collection method  Answer:  IV Team=IV Team collect   09/12/23 1258              Home Health: Yes Equipment/Devices: None  Discharge Condition: Stable with poor short and long-term prognosis CODE STATUS: DNR Diet recommendation:  Cardiac  Subjective: Seen and examined, patient very weak and deconditioned but still fully oriented.  He wants to go home.  He has no complaints.  Brief/Interim Summary: Jerome Kelley is a 83 y.o. male with medical history significant of lung cancer.  He also has metastatic adenocarcinoma of the lung.  Patient chronically uses 2 L/min of supplementary oxygen.  Patient was in his usual state of health till about 4 weeks ago.  Since that time patient is describing progressive lower extremity swelling initially starting around ankles progressing to the thighs.  For the last for 5 days patient has had progressive abdominal distention as well and for last 2 days he has been having exertional shortness of breath.  Shortness of breath has gotten so bad that the patient is requiring 6 L/min of oxygen at home to be comfortable and is still short of breath now.  There is no history of any fever cough expectoration chest pain palpitation or similar complaints in the past. Patient has mild genrealised abd disocmfort. No aggravating/reliving factor. No dysuria.   ER workup is notable for patient requiring 6 L/min of supplementary oxygen, marked abdominal distention noted.  Medical evaluation is sought.   Anasarca/cachexia Patient not  chronically on diuretics. is noted to have systemic fluid overload manifesting as crackles heard on lung and abdominal distention as well as edema. LFTs noted for slight elevation of bilirubin, albumin is low.  INR is elevated.  CAT scan is already showing cirrhosis changes of the liver. Troponin is within normal limits. Patient was given 1 dose of Lasix 40 mg followed by 5 mg/h infusion after that.  Monitor intake output and daily weights.  He has had decent diuresis with net -5.5 L since admission +5 L of paracentesis fluid.  Due to rising creatinine, Lasix drip was discontinued 09/13/2023.   Acute encephalopathy/?  Acute hepatic encephalopathy/delirium: Ammonia slightly elevated  09/13/2023 yesterday despite of taking lactulose.  Patient's mental status has improved and he remains alert and oriented since 09/14/2023.  No need to check repeat ammonia as long as he is alert and oriented but continue lactulose.  He was treated with nightly Seroquel and has remained alert and oriented for last 3 days.   Hypokalemia: Replenished and resolved.   Acute on chronic hypoxic respiratory failure: Chest x-ray shows possibility of infiltrates.  Atelectasis may also be the cause due to abdominal distention.  Patient is afebrile so doubt pneumonia.  Echo showed grade 1 diastolic dysfunction, systolic function is normal.  Respiratory status has improved post paracentesis, currently on room air and no crackles today.  Patient has been on room air for last 2 to 3 days.  However he has supplemental oxygen at home and he can use 1 to 2 L as he was using prior to coming.   Liver cirrhosis (HCC)/ascites/hypotension New Diagnosis based on CT scan demonstration, finding of ascites. Thrombocytoepnia.  Ferritin normal, viral hepatitis panel ruled out.  IR is consulted patient underwent diagnostic and therapeutic paracentesis 09/11/2023 with 5 L out.  Gave him 3 doses of albumin 09/12/2023, blood pressure remains low and creatinine was rising so he received another 2 units of albumin on 09/13/2023 making a total of 5 doses.  Due to crackles noted in the afternoon of 09/13/2023, 6th dose of albumin was discontinued.  He denies any shortness of breath but still has crackles.  On exam 09/14/2023, he did appear to have slightly more fluid shift and ascites so IR was consulted for paracentesis however per them, he did not have enough fluid and patient also wanted to wait.  Midodrine started 09/13/2023, increased dosage on 09/14/2023 due to low blood pressure, blood pressure is improving.  Lactic acid is normal.  Started him on oral Lasix and Aldactone yesterday.  Blood pressure has remained stable.  Discharging on both of  these medications as well as lactulose to prevent hepatic encephalopathy.   AKI: Creatinine jumped from 1.0-1.32 and now back to normal today.  Avoid nephrotoxic agents.   Chronic alcoholism: Patient told me that he drinks 3 beers a day for last more than 25 years.  The send confirmed that the patient has been drinking since he was born and he is 83 years old.  However he has cut down since 2 months since he was diagnosed with colon cancer.   Esophageal varices (HCC) Seen incidentally on CT abd pelvis. Started nadolol 20 mg daily which I am increasing dosage to twice daily at discharge.   Nicotine abuse I have discussed tobacco cessation with the patient.  I have counseled the patient regarding the negative impacts of continued tobacco use including but not limited to lung cancer, COPD, and cardiovascular disease.  I have discussed alternatives to tobacco  and modalities that may help facilitate tobacco cessation including but not limited to biofeedback, hypnosis, and medications.  Total time spent with tobacco counseling was 5 minutes.   Colon cancer [facility]) Follow-up with oncology as outpatient.   Anxiety -resume PTA Ativan   Anemia of chronic disease/folate deficiency anemia: Hemoglobin at baseline between 9-10.  Hemoglobin this morning dropped to 7.4 but improved to 8 without transfusion further workup revealed folate deficiency, he was supplemented and is being discharged on that.   Thrombocytopenia: This is also chronic.  No signs of bleeding.  Stable.  Monitor.   Severe protein calorie malnutrition: Seen by dietitian.  He is advised to increase protein intake.   GOC: I believe due to multiple medical problems and new diagnosis of cirrhosis, patient appears to have poor short-term and long-term prognosis.  Palliative care consulted, family and patient agreed to go home with hospice.   Hypophosphatemia: Supplemented in the hospital.   CODE STATUS: On 09/14/2023, patient was fully alert  and oriented, discussed with him about CODE STATUS, he was very clear that he does not want to be full code and wanted to be DNR.  Changed to DNR per patient's wishes.  Discharge plan was discussed with patient and/or family member and they verbalized understanding and agreed with it.  Discharge Diagnoses:  Principal Problem:   Anasarca Active Problems:   Colon cancer (HCC)   Nicotine abuse   Ascites   Esophageal varices (HCC)   Cirrhosis (HCC)   Cachexia (HCC)   Protein-calorie malnutrition, severe    Discharge Instructions   Allergies as of 09/16/2023   No Known Allergies      Medication List     TAKE these medications    aspirin 81 MG chewable tablet Chew 1 tablet (81 mg total) by mouth 2 (two) times daily. What changed: when to take this   cyanocobalamin 1000 MCG tablet Commonly known as: VITAMIN B12 Take 1 tablet (1,000 mcg total) by mouth daily.   folic acid 1 MG tablet Commonly known as: FOLVITE Take 1 tablet (1 mg total) by mouth daily. Start taking on: September 17, 2023   furosemide 40 MG tablet Commonly known as: LASIX Take 1 tablet (40 mg total) by mouth daily. Start taking on: September 17, 2023   lactulose 10 GM/15ML solution Commonly known as: CHRONULAC Take 45 mLs (30 g total) by mouth 2 (two) times daily.   levothyroxine 50 MCG tablet Commonly known as: SYNTHROID TAKE 1 TABLET BY MOUTH DAILY BEFORE BREAKFAST   lidocaine 5 % Commonly known as: LIDODERM Place 1 patch onto the skin daily. Remove & Discard patch within 12 hours or as directed by MD   lidocaine-prilocaine cream Commonly known as: EMLA APPLY TO AFFECTED AREA ONCE AS DIRECTED   LORazepam 0.5 MG tablet Commonly known as: ATIVAN TAKE 1 TABLET BY MOUTH EVERY 8 HOURS AS NEEDED FOR ANXIETY What changed: when to take this   megestrol 400 MG/10ML suspension Commonly known as: MEGACE Take 10 mLs (400 mg total) by mouth 2 (two) times daily.   midodrine 10 MG tablet Commonly known  as: PROAMATINE Take 1 tablet (10 mg total) by mouth 3 (three) times daily with meals.   nadolol 20 MG tablet Commonly known as: CORGARD Take 1 tablet (20 mg total) by mouth 2 (two) times daily.   spironolactone 25 MG tablet Commonly known as: ALDACTONE Take 1 tablet (25 mg total) by mouth daily. Start taking on: September 17, 2023   Stool Softener 100  MG capsule Generic drug: docusate sodium Take 100 mg by mouth daily as needed for mild constipation.        Follow-up Information     Connect with your PCP/Specialist as discussed. Schedule an appointment as soon as possible for a visit .   Contact information: [email or web address] Call our physician referral line at [phone].        Irven Coe, MD Follow up in 1 week(s).   Specialty: Family Medicine Contact information: [address] [place] [address] [phone]                No Known Allergies  Consultations: IR   Procedures/Studies: IR ABDOMEN US LIMITED Result Date: 09/14/2023 INDICATION: Metastatic lung cancer with ascites IR asked to perform therapeutic paracentesis EXAM: ULTRASOUND ABDOMEN LIMITED FINDINGS: Imaging of all 4 quadrants of the abdomen reveal small pocket of ascites IMPRESSION: Small amount of ascites seen on ultrasound. After discussion of the risks versus benefits of the procedure the patient decided to defer paracentesis at this time. Performed By Theresa Mulligan, PA-C Electronically Signed   By: Gilmer Mor D.O.   On: 09/14/2023 15:42   IR Paracentesis Result Date: 09/11/2023 INDICATION: Patient with a history of metastatic lung cancer presents today with ascites. Interventional radiology asked to perform a diagnostic and therapeutic paracentesis. EXAM: ULTRASOUND GUIDED PARACENTESIS MEDICATIONS: 1% lidocaine 10 mL COMPLICATIONS: None immediate. PROCEDURE: Informed written consent was obtained from the patient after a discussion of the  risks, benefits and alternatives to treatment. A timeout was performed prior to the initiation of the procedure. Initial ultrasound scanning demonstrates a large amount of ascites within the right lower abdominal quadrant. The right lower abdomen was prepped and draped in the usual sterile fashion. 1% lidocaine was used for local anesthesia. Following this, a 19 gauge, 7-cm, Yueh catheter was introduced. An ultrasound image was saved for documentation purposes. The paracentesis was performed. The catheter was removed and a dressing was applied. The patient tolerated the procedure well without immediate post procedural complication. Patient received post-procedure intravenous albumin; see nursing notes for details. FINDINGS: A total of approximately 5 L of clear yellow fluid was removed. Samples were sent to the laboratory as requested by the clinical team. IMPRESSION: Successful ultrasound-guided paracentesis yielding 5 liters of peritoneal fluid. Procedure performed by Alwyn Ren NP Electronically Signed   By: Marliss Coots M.D.   On: 09/11/2023 20:49   ECHOCARDIOGRAM COMPLETE Result Date: 09/11/2023    ECHOCARDIOGRAM REPORT   Patient Name:   ALIN CHAVIRA Date of Exam: 09/11/2023 Medical Rec #:  [id number]    Height:       68.0 in Accession #:    [id number]   Weight:       161.0 lb Date of Birth:  [birth year]    BSA:          1.864 m Patient Age:    82 years     BP:           104/70 mmHg Patient Gender: M            HR:           65 bpm. Exam Location:  Inpatient Procedure: 2D Echo, Cardiac Doppler and Color Doppler Indications:    I50.40* Unspecified combined systolic (congestive) and diastolic                 (congestive) heart failure  History:        Patient has no prior history  of Echocardiogram examinations.                 Risk Factors:Current Smoker and Hypertension. Lung cancer.  Sonographer:    Sheralyn Boatman RDCS Referring Phys: [id number] [facility] GOEL  Sonographer Comments: Image acquisition challenging due  to uncooperative patient. Patient rolled away from probe and said I'm getting water, I tried to tell him I need to finish exam, he said he was going to "run me out" Exam was ended. IMPRESSIONS  1. Left ventricular ejection fraction, by estimation, is 55%. The left ventricle has normal function. The left ventricle has no regional wall motion abnormalities. There is mild concentric left ventricular hypertrophy. Left ventricular diastolic parameters are consistent with Grade I diastolic dysfunction (impaired relaxation).  2. Right ventricular systolic function is normal. The right ventricular size is normal. Tricuspid regurgitation signal is inadequate for assessing PA pressure.  3. The mitral valve is normal in structure. Mild mitral valve regurgitation. No evidence of mitral stenosis.  4. The aortic valve is tricuspid. There is mild calcification of the aortic valve. Aortic valve regurgitation is not visualized. No aortic stenosis is present.  5. Aortic dilatation noted. There is mild dilatation of the aortic root, measuring 40 mm.  6. IVC not visualized. FINDINGS  Left Ventricle: Left ventricular ejection fraction, by estimation, is 55%. The left ventricle has normal function. The left ventricle has no regional wall motion abnormalities. The left ventricular internal cavity size was normal in size. There is mild concentric left ventricular hypertrophy. Left ventricular diastolic parameters are consistent with Grade I diastolic dysfunction (impaired relaxation). Right Ventricle: The right ventricular size is normal. No increase in right ventricular wall thickness. Right ventricular systolic function is normal. Tricuspid regurgitation signal is inadequate for assessing PA pressure. Left Atrium: Left atrial size was normal in size. Right Atrium: Right atrial size was normal in size. Pericardium: There is no evidence of pericardial effusion. Mitral Valve: The mitral valve is normal in structure. Mild mitral annular  calcification. Mild mitral valve regurgitation. No evidence of mitral valve stenosis. Tricuspid Valve: The tricuspid valve is normal in structure. Tricuspid valve regurgitation is not demonstrated. Aortic Valve: The aortic valve is tricuspid. There is mild calcification of the aortic valve. Aortic valve regurgitation is not visualized. No aortic stenosis is present. Pulmonic Valve: The pulmonic valve was normal in structure. Pulmonic valve regurgitation is trivial. Aorta: Aortic dilatation noted. There is mild dilatation of the aortic root, measuring 40 mm. Venous: The inferior vena cava was not well visualized. IAS/Shunts: No atrial level shunt detected by color flow Doppler.  LEFT VENTRICLE PLAX 2D LVIDd:         4.40 cm     Diastology LVIDs:         3.20 cm     LV e' medial:    4.03 cm/s LV PW:         1.20 cm     LV E/e' medial:  12.0 LV IVS:        1.10 cm     LV e' lateral:   5.44 cm/s LVOT diam:     2.50 cm     LV E/e' lateral: 8.9 LV SV:         85 LV SV Index:   46 LVOT Area:     4.91 cm  LV Volumes (MOD) LV vol d, MOD A4C: 35.7 ml LV vol s, MOD A4C: 11.8 ml LV SV MOD A4C:     35.7 ml RIGHT VENTRICLE  RV S prime:     10.54 cm/s TAPSE (M-mode): 2.0 cm LEFT ATRIUM             Index        RIGHT ATRIUM           Index LA diam:        2.80 cm 1.50 cm/m   RA Area:     11.20 cm LA Vol (A2C):   20.2 ml 10.84 ml/m  RA Volume:   20.90 ml  11.21 ml/m LA Vol (A4C):   10.5 ml 5.63 ml/m LA Biplane Vol: 16.0 ml 8.58 ml/m  AORTIC VALVE             PULMONIC VALVE LVOT Vmax:   84.40 cm/s  PR End Diast Vel: 1.25 msec LVOT Vmean:  52.800 cm/s LVOT VTI:    0.174 m  AORTA Ao Root diam: 4.00 cm Ao Asc diam:  3.70 cm MITRAL VALVE MV Area (PHT): 3.17 cm    SHUNTS MV Decel Time: 239 msec    Systemic VTI:  0.17 m MV E velocity: 48.50 cm/s  Systemic Diam: 2.50 cm MV A velocity: 81.60 cm/s MV E/A ratio:  0.59 Dalton McleanMD Electronically signed by Wilfred Lacy Signature Date/Time: 09/11/2023/2:02:15 PM    Final    CT  ABDOMEN PELVIS W CONTRAST Result Date: 09/10/2023 CLINICAL DATA:  History of lung cancer, on chronic oxygen, left-sided abdominal pain for 2 weeks. Shortness of breath. EXAM: CT ABDOMEN AND PELVIS WITH CONTRAST TECHNIQUE: Multidetector CT imaging of the abdomen and pelvis was performed using the standard protocol following bolus administration of intravenous contrast. RADIATION DOSE REDUCTION: This exam was performed according to the departmental dose-optimization program which includes automated exposure control, adjustment of the mA and/or kV according to patient size and/or use of iterative reconstruction technique. CONTRAST:  75mL OMNIPAQUE IOHEXOL 350 MG/ML SOLN COMPARISON:  Same day chest radiograph and PET/CT 06/26/2023 FINDINGS: Lower chest: Scarring/atelectasis in the lower lungs. No acute abnormality. Hepatobiliary: Shrunken liver with nodular contour compatible with cirrhosis. Portal vein is patent. Gallbladder and biliary tree are unremarkable. Pancreas: Unremarkable. Spleen: Borderline splenomegaly measuring 14.4 cm in craniocaudal dimension. Adrenals/Urinary Tract: Normal adrenal glands. No urinary calculi or hydronephrosis. The bladder is unremarkable. Stomach/Bowel: Small hiatal hernia. Diffuse wall thickening about the small bowel. Additional wall thickening about the transverse colon postoperative change about the right colon with ileocolic anastomosis at the hepatic flexure. Vascular/Lymphatic: Aortic atherosclerosis. Paraesophageal varices. No enlarged abdominal or pelvic lymph nodes. Reproductive: No acute abnormality. Other: Large volume abdominopelvic ascites. No free intraperitoneal air. Musculoskeletal: No acute fracture.  Right THA.  Body wall edema. IMPRESSION: 1. Cirrhosis with sequela of portal hypertension including large volume abdominopelvic ascites, borderline splenomegaly, and paraesophageal varices. 2. Diffuse wall thickening about the small bowel and transverse colon, likely due  to portal enteropathy/colopathy. Enterocolitis could appear similarly. Aortic Atherosclerosis (ICD10-I70.0). Electronically Signed   By: Minerva Fester M.D.   On: 09/10/2023 23:13   DG Chest Portable 1 View Result Date: 09/10/2023 CLINICAL DATA:  Shortness of breath. History of lung cancer. Left-sided abdominal pain. EXAM: PORTABLE CHEST 1 VIEW COMPARISON:  Head CT 06/26/2023 FINDINGS: Right chest port in place. Volume loss in the right hemithorax is likely post treatment related change, right hilar surgical clips. Heart is upper normal in size. There are calcified mediastinal and hilar lymph nodes. Suggestion of streaky left lung base opacity. No pulmonary edema, large pleural effusion or pneumothorax. Left pleural calcifications on prior PET are not  well seen by radiograph. Skin fold projects over the left hemithorax. IMPRESSION: 1. Suggestion of streaky left lung base opacity, may represent atelectasis or pneumonia. 2. Volume loss in the right hemithorax is likely post treatment related change. Electronically Signed   By: Narda Rutherford M.D.   On: 09/10/2023 21:11     Discharge Exam: Vitals:   09/15/23 1800 09/15/23 1932  BP: (!) 121/52 (!) 133/55  Pulse: 60 (!) 57  Resp: (!) 23 20  Temp:  97.8 F (36.6 C)  SpO2: 100% 100%   Vitals:   09/15/23 1800 09/15/23 1932 09/16/23 0500 09/16/23 0825  BP: (!) 121/52 (!) 133/55    Pulse: 60 (!) 57    Resp: (!) 23 20    Temp:  97.8 F (36.6 C)    TempSrc:  Oral  Oral  SpO2: 100% 100%    Weight:   67.3 kg     General: Pt is alert, awake, not in acute distress however appears severely deconditioned Cardiovascular: RRR, S1/S2 +, no rubs, no gallops Respiratory: CTA bilaterally, no wheezing, no rhonchi Abdominal: Soft, NT, ND, bowel sounds + with positive fluid shift, mild ascites Extremities: no edema, no cyanosis    The results of significant diagnostics from this hospitalization (including imaging, microbiology, ancillary and laboratory)  are listed below for reference.     Microbiology: Recent Results (from the past 240 hours)  Resp panel by RT-PCR (RSV, Flu A&B, Covid) Anterior Nasal Swab     Status: None   Collection Time: 09/10/23  8:20 PM   Specimen: Anterior Nasal Swab  Result Value Ref Range Status   SARS Coronavirus 2 by RT PCR NEGATIVE NEGATIVE Final   Influenza A by PCR NEGATIVE NEGATIVE Final   Influenza B by PCR NEGATIVE NEGATIVE Final    Comment: (NOTE) The Xpert Xpress SARS-CoV-2/FLU/RSV plus assay is intended as an aid in the diagnosis of influenza from Nasopharyngeal swab specimens and should not be used as a sole basis for treatment. Nasal washings and aspirates are unacceptable for Xpert Xpress SARS-CoV-2/FLU/RSV testing.  Fact Sheet for Patients: BloggerCourse.com  Fact Sheet for Healthcare Providers: SeriousBroker.it  This test is not yet approved or cleared by the [place] FDA and has been authorized for detection and/or diagnosis of SARS-CoV-2 by FDA under an Emergency Use Authorization (EUA). This EUA will remain in effect (meaning this test can be used) for the duration of the COVID-19 declaration under Section 564(b)(1) of the Act, 21 U.S.C. section 360bbb-3(b)(1), unless the authorization is terminated or revoked.     Resp Syncytial Virus by PCR NEGATIVE NEGATIVE Final    Comment: (NOTE) Fact Sheet for Patients: BloggerCourse.com  Fact Sheet for Healthcare Providers: SeriousBroker.it  This test is not yet approved or cleared by the [place] FDA and has been authorized for detection and/or diagnosis of SARS-CoV-2 by FDA under an Emergency Use Authorization (EUA). This EUA will remain in effect (meaning this test can be used) for the duration of the COVID-19 declaration under Section 564(b)(1) of the Act, 21 U.S.C. section 360bbb-3(b)(1), unless the authorization is  terminated or revoked.  Performed at [facility], [address], [place], [place] [address]   Culture, body fluid w Gram Stain-bottle     Status: None   Collection Time: 09/11/23 11:09 AM   Specimen: Peritoneal Washings  Result Value Ref Range Status   Specimen Description PERITONEAL  Final   Special Requests NONE  Final   Culture   Final  NO GROWTH 5 DAYS Performed at [facility], [address], [place], [place] [address]    Report Status 09/16/2023 FINAL  Final  Gram stain     Status: None   Collection Time: 09/11/23 11:09 AM   Specimen: Peritoneal Washings  Result Value Ref Range Status   Specimen Description PERITONEAL  Final   Special Requests NONE  Final   Gram Stain   Final    WBC PRESENT,BOTH PMN AND MONONUCLEAR NO ORGANISMS SEEN CYTOSPIN SMEAR Performed at [facility], [address] [place], [place] [address]    Report Status 09/11/2023 FINAL  Final     Labs: BNP (last 3 results) No results for input(s): "BNP" in the last 8760 hours. Basic Metabolic Panel: Recent Labs  Lab 09/11/23 0415 09/12/23 0316 09/13/23 0513 09/13/23 1705 09/14/23 0502 09/14/23 1700 09/15/23 0535 09/15/23 1605 09/16/23 0644  NA 141 139 139  --  140  --  141  --   --   K 4.1 3.6 2.8*   < > 3.7 3.7 3.7 3.8 3.9  CL 115* 107 105  --  110  --  108  --   --   CO2 20* 23 25  --  24  --  23  --   --   GLUCOSE 116* 95 115*  --  101*  --  104*  --   --   BUN 16 19 28*  --  26*  --  24*  --   --   CREATININE 0.91 1.08 1.32*  --  1.01  --  0.81  --   --   CALCIUM 7.7* 7.7* 7.6*  --  7.6*  --  7.7*  --   --   MG 2.0 1.9 1.9   < > 1.9 2.1 2.0 2.0 2.0  PHOS 2.5  --  2.7   < > 1.9* 2.2* 2.3* 2.6 2.7   < > = values in this interval not displayed.   Liver Function Tests: Recent Labs  Lab 09/10/23 2020 09/11/23 0415 09/12/23 0316 09/13/23 0513  AST 18 17 21 17   ALT 15 12 13 11   ALKPHOS 85 70 72 61  BILITOT 1.6* 1.8* 2.8* 1.6*  PROT 5.3* 5.0* 4.9* 4.7*   ALBUMIN 2.2* 2.1* 2.1* 2.3*   No results for input(s): "LIPASE", "AMYLASE" in the last 168 hours. Recent Labs  Lab 09/11/23 1029 09/12/23 0526 09/13/23 0513  AMMONIA 42* 32 41*   CBC: Recent Labs  Lab 09/10/23 2020 09/11/23 0136 09/11/23 0415 09/12/23 0316 09/14/23 0502 09/14/23 0839  WBC 8.3 7.1 6.0 13.7* 4.5 5.0  NEUTROABS 6.6  --   --  11.2* 3.3 3.9  HGB 10.4* 9.9* 9.4* 10.6* 7.4* 8.0*  HCT 32.0* 30.1* 29.0* 31.9* 22.1* 23.9*  MCV 102.2* 101.3* 102.5* 99.4 100.5* 100.8*  PLT 104* 96* 92* 132* 76* 82*   Cardiac Enzymes: No results for input(s): "CKTOTAL", "CKMB", "CKMBINDEX", "TROPONINI" in the last 168 hours. BNP: Invalid input(s): "POCBNP" CBG: No results for input(s): "GLUCAP" in the last 168 hours. D-Dimer No results for input(s): "DDIMER" in the last 72 hours. Hgb A1c No results for input(s): "HGBA1C" in the last 72 hours. Lipid Profile No results for input(s): "CHOL", "HDL", "LDLCALC", "TRIG", "CHOLHDL", "LDLDIRECT" in the last 72 hours. Thyroid function studies No results for input(s): "TSH", "T4TOTAL", "T3FREE", "THYROIDAB" in the last 72 hours.  Invalid input(s): "FREET3" Anemia work up Recent Labs    09/14/23 0839  VITAMINB12 747  FOLATE 5.3*  TIBC NOT CALCULATED  IRON 18*  RETICCTPCT 3.2*   Urinalysis    Component Value Date/Time   COLORURINE AMBER (A) 09/11/2023 0044   APPEARANCEUR CLEAR 09/11/2023 0044   LABSPEC >1.046 (H) 09/11/2023 0044   PHURINE 5.0 09/11/2023 0044   GLUCOSEU NEGATIVE 09/11/2023 0044   HGBUR NEGATIVE 09/11/2023 0044   BILIRUBINUR NEGATIVE 09/11/2023 0044   KETONESUR NEGATIVE 09/11/2023 0044   PROTEINUR NEGATIVE 09/11/2023 0044   UROBILINOGEN 1.0 11/04/2009 1017   NITRITE NEGATIVE 09/11/2023 0044   LEUKOCYTESUR NEGATIVE 09/11/2023 0044   Sepsis Labs Recent Labs  Lab 09/11/23 0415 09/12/23 0316 09/14/23 0502 09/14/23 0839  WBC 6.0 13.7* 4.5 5.0   Microbiology Recent Results (from the past 240 hours)   Resp panel by RT-PCR (RSV, Flu A&B, Covid) Anterior Nasal Swab     Status: None   Collection Time: 09/10/23  8:20 PM   Specimen: Anterior Nasal Swab  Result Value Ref Range Status   SARS Coronavirus 2 by RT PCR NEGATIVE NEGATIVE Final   Influenza A by PCR NEGATIVE NEGATIVE Final   Influenza B by PCR NEGATIVE NEGATIVE Final    Comment: (NOTE) The Xpert Xpress SARS-CoV-2/FLU/RSV plus assay is intended as an aid in the diagnosis of influenza from Nasopharyngeal swab specimens and should not be used as a sole basis for treatment. Nasal washings and aspirates are unacceptable for Xpert Xpress SARS-CoV-2/FLU/RSV testing.  Fact Sheet for Patients: BloggerCourse.com  Fact Sheet for Healthcare Providers: SeriousBroker.it  This test is not yet approved or cleared by the [place] FDA and has been authorized for detection and/or diagnosis of SARS-CoV-2 by FDA under an Emergency Use Authorization (EUA). This EUA will remain in effect (meaning this test can be used) for the duration of the COVID-19 declaration under Section 564(b)(1) of the Act, 21 U.S.C. section 360bbb-3(b)(1), unless the authorization is terminated or revoked.     Resp Syncytial Virus by PCR NEGATIVE NEGATIVE Final    Comment: (NOTE) Fact Sheet for Patients: BloggerCourse.com  Fact Sheet for Healthcare Providers: SeriousBroker.it  This test is not yet approved or cleared by the [place] FDA and has been authorized for detection and/or diagnosis of SARS-CoV-2 by FDA under an Emergency Use Authorization (EUA). This EUA will remain in effect (meaning this test can be used) for the duration of the COVID-19 declaration under Section 564(b)(1) of the Act, 21 U.S.C. section 360bbb-3(b)(1), unless the authorization is terminated or revoked.  Performed at [facility], [address] [place],  [place] [address]   Culture, body fluid w Gram Stain-bottle     Status: None   Collection Time: 09/11/23 11:09 AM   Specimen: Peritoneal Washings  Result Value Ref Range Status   Specimen Description PERITONEAL  Final   Special Requests NONE  Final   Culture   Final    NO GROWTH 5 DAYS Performed at [facility], [address] [place], [place] [address]    Report Status 09/16/2023 FINAL  Final  Gram stain     Status: None   Collection Time: 09/11/23 11:09 AM   Specimen: Peritoneal Washings  Result Value Ref Range Status   Specimen Description PERITONEAL  Final   Special Requests NONE  Final   Gram Stain   Final    WBC PRESENT,BOTH PMN AND MONONUCLEAR NO ORGANISMS SEEN CYTOSPIN SMEAR Performed at [facility], [address] [place], [place] [address]    Report Status 09/11/2023 FINAL  Final  FURTHER DISCHARGE INSTRUCTIONS:   Get Medicines reviewed and adjusted: Please take all your medications with you for your next visit with your Primary MD   Laboratory/radiological data: Please request your Primary MD to go over all hospital tests and procedure/radiological results at the follow up, please ask your Primary MD to get all Hospital records sent to his/her office.   In some cases, they will be blood work, cultures and biopsy results pending at the time of your discharge. Please request that your primary care M.D. goes through all the records of your hospital data and follows up on these results.   Also Note the following: If you experience worsening of your admission symptoms, develop shortness of breath, life threatening emergency, suicidal or homicidal thoughts you must seek medical attention immediately by calling 911 or calling your MD immediately  if symptoms less severe.   You must read complete instructions/literature along with all the possible adverse reactions/side effects for all the Medicines you take and that have been prescribed to you. Take any new  Medicines after you have completely understood and accpet all the possible adverse reactions/side effects.    Do not drive when taking Pain medications or sleeping medications (Benzodaizepines)   Do not take more than prescribed Pain, Sleep and Anxiety Medications. It is not advisable to combine anxiety,sleep and pain medications without talking with your primary care practitioner   Special Instructions: If you have smoked or chewed Tobacco  in the last 2 yrs please stop smoking, stop any regular Alcohol  and or any Recreational drug use.   Wear Seat belts while driving.   Please note: You were cared for by a hospitalist during your hospital stay. Once you are discharged, your primary care physician will handle any further medical issues. Please note that NO REFILLS for any discharge medications will be authorized once you are discharged, as it is imperative that you return to your primary care physician (or establish a relationship with a primary care physician if you do not have one) for your post hospital discharge needs so that they can reassess your need for medications and monitor your lab values  Time coordinating discharge: Over 30 minutes  SIGNED:   Hughie Closs, MD  [facility] 09/16/2023, 12:12 PM *Please note that this is a verbal dictation therefore any spelling or grammatical errors are due to the "Dragon Medical One" system interpretation. If 7PM-7AM, please contact night-coverage [email or web address]

## 2023-09-16 NOTE — Progress Notes (Signed)
Daily Progress Note   Patient Name: Jerome Kelley       Date: 09/16/2023 DOB: [birth year]  Age: 83 y.o. MRN#: [id number] Attending Physician: Jerome Closs, MD Primary Care Physician: Jerome Coe, MD Admit Date: 09/10/2023  Reason for Consultation/Follow-up: Establishing goals of care  Length of Stay: 5  Current Medications: Scheduled Meds:   aspirin  81 mg Oral Daily   Chlorhexidine Gluconate Cloth  6 each Topical Daily   enoxaparin (LOVENOX) injection  40 mg Subcutaneous Q24H   feeding supplement  237 mL Oral BID BM   folic acid  1 mg Oral Daily   furosemide  40 mg Oral Daily   Gerhardt's butt cream   Topical Daily   lactulose  30 g Oral BID   levothyroxine  50 mcg Oral QAC breakfast   midodrine  10 mg Oral TID WC   multivitamin with minerals  1 tablet Oral Daily   nadolol  20 mg Oral Daily   phosphorus  250 mg Oral BID   potassium chloride SA  20 mEq Oral Daily   QUEtiapine  25 mg Oral Q24H   sodium chloride flush  10-40 mL Intracatheter Q12H   sodium chloride flush  3 mL Intravenous Q12H   spironolactone  25 mg Oral Daily   thiamine  100 mg Oral BID    Continuous Infusions:   PRN Meds: acetaminophen **OR** acetaminophen, fentaNYL (SUBLIMAZE) injection, nicotine polacrilex, polyethylene glycol  Physical Exam Vitals reviewed.  Constitutional:      General: He is not in acute distress.    Appearance: He is ill-appearing.  Cardiovascular:     Rate and Rhythm: Bradycardia present.  Pulmonary:     Effort: Pulmonary effort is normal.  Skin:    General: Skin is warm and dry.  Neurological:     Mental Status: He is oriented to person, place, and time.  Psychiatric:        Mood and Affect: Mood normal.        Behavior: Behavior normal.             Vital Signs: BP (!)  133/55 (BP Location: Left Arm)   Pulse (!) 57   Temp 97.8 F (36.6 C) (Oral)   Resp 20   Wt 67.3 kg   SpO2 100%   BMI 22.56 kg/m  SpO2: SpO2: 100 % O2 Device: O2 Device: Room Air O2 Flow Rate: O2 Flow Rate (L/min): 4 L/min    Patient Active Problem List   Diagnosis Date Noted   Protein-calorie malnutrition, severe 09/12/2023   Esophageal varices (HCC) 09/11/2023   Cirrhosis (HCC) 09/11/2023   Cachexia (HCC) 09/11/2023   Nicotine abuse 09/10/2023   Ascites 09/10/2023   Anasarca 09/10/2023   COPD, mild (HCC) 01/24/2021   Port-A-Cath in place 10/29/2020   Malignant neoplasm of upper lobe of right lung (HCC) 04/30/2020   Goals of care, counseling/discussion 03/15/2020   Hip fracture (HCC) 10/18/2018   Essential hypertension 10/18/2018   Closed fracture of neck of right femur (HCC)    Colon cancer (HCC) 09/18/2011    Palliative Care Assessment & Plan   Patient Profile:  83 y.o. male  with past medical history of lung cancer, chronically uses 2 L/min of supplementary oxygen, colon cancer, cirrhosis, cachexia admitted on 09/10/2023 with bilateral leg and abdominal edema and anasarca.    1/28 paracentesis 5 liters  Today's Discussion: Patient is lying in bed. He appears weak and chronically ill but in NAD. He reports no pain or concerns at this time. We reviewed our discussion yesterday with his son Jerome Kelley. We discuss the plan for him to go home with hospice. He is excited to get home to his family. He has no questions about hospice services at this time.   1020: Left voicemail with callback number for son Jerome Kelley.  1200: Notified by CM that they have chosen [facility]. Likely discharge tomorrow.  Recommendations/Plan: DNR Limited scope of treatment: DNI Continue current plan of care Discharge home with home hospice--- [facility] Continued PMT support    Code Status:    Code Status Orders  (From admission, onward)           Start     Ordered   09/14/23 1103  Do not attempt  resuscitation (DNR)- Limited -Do Not Intubate (DNI)  (Code Status)  Continuous       Question Answer Comment  If pulseless and not breathing No CPR or chest compressions.   In Pre-Arrest Conditions (Patient Is Breathing and Has A Pulse) Do not intubate. Provide all appropriate non-invasive medical interventions. Avoid ICU transfer unless indicated or required.   Consent: Discussion documented in EHR or advanced directives reviewed      09/14/23 1102           Extensive chart review has been completed prior to seeing the patient including labs, vital signs, imaging, progress/consult notes, orders, medications, and available advance directive documents.  Care plan was discussed with Dr. Jacqulyn Bath and CM  Time spent: 35 minutes  Thank you for allowing the Palliative Medicine Team to assist in the care of this patient.    Sherryll Burger, NP  Please contact Palliative Medicine Team phone at [phone] for questions and concerns.

## 2023-09-16 NOTE — Addendum Note (Signed)
Encounter addended by: Ronny Bacon, PA-C on: 09/16/2023 5:13 PM  Actions taken: Clinical Note Signed

## 2023-09-17 NOTE — Consult Note (Signed)
[facility] Liaison Consult Note   09/17/2023  XANE AMSDEN 09-Apr-1941 [id number]  Reviewed for Disposition:    Insurance: Medicare ACO REACH  Primary Care Provider:  Irven Coe, MD, [facility] Physician at [facility] listed  Chart reviewed  for Hospice Care Home with Hospice with [facility] listed  Plan: Patient will have full care coordination services through Hospice and needs will be met at the hospice level of care. No planned follow up for transitional needs.. Will update [facility] team of disposition. For questions,   Charlesetta Shanks, RN, BSN, CCM [place]  [facility], [facility] Liaison Direct Dial: [phone] or secure chat Email: [email or web address]

## 2023-09-20 ENCOUNTER — Telehealth: Payer: Self-pay | Admitting: Pulmonary Disease

## 2023-09-20 NOTE — Telephone Encounter (Signed)
 FYI

## 2023-09-20 NOTE — Telephone Encounter (Signed)
Patient is under hospice care

## 2023-10-13 DEATH — deceased

## 2023-10-22 ENCOUNTER — Inpatient Hospital Stay (HOSPITAL_BASED_OUTPATIENT_CLINIC_OR_DEPARTMENT_OTHER): Payer: Medicare Other | Admitting: Pulmonary Disease

## 2024-03-19 NOTE — Telephone Encounter (Signed)
 Neda Jennet LABOR, MD  Randine Warren BRAVO, RN; Timmy Maude SAUNDERS, MD3 years ago    Spoke with Mr. Bir He will like to discuss his results with Dr. Timmy before agreeing to any further testing. Once he has had this discussion, you can reach out to me/ pulmonary team to get him scheduled for EBUS.

## 2024-03-24 ENCOUNTER — Encounter: Payer: Self-pay | Admitting: Hematology & Oncology
